# Patient Record
Sex: Female | Born: 1945
Health system: Southern US, Community
[De-identification: ages and names within clinical notes are randomized; demographics above are authoritative.]

## PROBLEM LIST (undated history)

## (undated) DIAGNOSIS — J439 Emphysema, unspecified: Secondary | ICD-10-CM

## (undated) DIAGNOSIS — I10 Essential (primary) hypertension: Secondary | ICD-10-CM

## (undated) DIAGNOSIS — N189 Chronic kidney disease, unspecified: Secondary | ICD-10-CM

## (undated) DIAGNOSIS — R42 Dizziness and giddiness: Secondary | ICD-10-CM

## (undated) DIAGNOSIS — F329 Major depressive disorder, single episode, unspecified: Secondary | ICD-10-CM

## (undated) DIAGNOSIS — M1990 Unspecified osteoarthritis, unspecified site: Secondary | ICD-10-CM

## (undated) DIAGNOSIS — I509 Heart failure, unspecified: Secondary | ICD-10-CM

## (undated) DIAGNOSIS — M109 Gout, unspecified: Secondary | ICD-10-CM

## (undated) DIAGNOSIS — R7989 Other specified abnormal findings of blood chemistry: Secondary | ICD-10-CM

## (undated) DIAGNOSIS — I071 Rheumatic tricuspid insufficiency: Secondary | ICD-10-CM

## (undated) DIAGNOSIS — J189 Pneumonia, unspecified organism: Secondary | ICD-10-CM

## (undated) DIAGNOSIS — Z8709 Personal history of other diseases of the respiratory system: Secondary | ICD-10-CM

## (undated) DIAGNOSIS — R945 Abnormal results of liver function studies: Secondary | ICD-10-CM

## (undated) DIAGNOSIS — E119 Type 2 diabetes mellitus without complications: Secondary | ICD-10-CM

## (undated) DIAGNOSIS — E875 Hyperkalemia: Secondary | ICD-10-CM

## (undated) DIAGNOSIS — Z720 Tobacco use: Secondary | ICD-10-CM

## (undated) DIAGNOSIS — E663 Overweight: Secondary | ICD-10-CM

## (undated) DIAGNOSIS — I4891 Unspecified atrial fibrillation: Secondary | ICD-10-CM

## (undated) DIAGNOSIS — F32A Depression, unspecified: Secondary | ICD-10-CM

## (undated) DIAGNOSIS — Z79899 Other long term (current) drug therapy: Secondary | ICD-10-CM

## (undated) DIAGNOSIS — I340 Nonrheumatic mitral (valve) insufficiency: Secondary | ICD-10-CM

## (undated) DIAGNOSIS — R9430 Abnormal result of cardiovascular function study, unspecified: Secondary | ICD-10-CM

## (undated) DIAGNOSIS — E079 Disorder of thyroid, unspecified: Secondary | ICD-10-CM

## (undated) DIAGNOSIS — E785 Hyperlipidemia, unspecified: Secondary | ICD-10-CM

## (undated) DIAGNOSIS — IMO0002 Reserved for concepts with insufficient information to code with codable children: Secondary | ICD-10-CM

## (undated) DIAGNOSIS — K219 Gastro-esophageal reflux disease without esophagitis: Secondary | ICD-10-CM

## (undated) HISTORY — DX: Nonrheumatic mitral (valve) insufficiency: I34.0

## (undated) HISTORY — DX: Abnormal results of liver function studies: R94.5

## (undated) HISTORY — DX: Major depressive disorder, single episode, unspecified: F32.9

## (undated) HISTORY — DX: Chronic kidney disease, unspecified: N18.9

## (undated) HISTORY — DX: Tobacco use: Z72.0

## (undated) HISTORY — DX: Personal history of other diseases of the respiratory system: Z87.09

## (undated) HISTORY — DX: Disorder of thyroid, unspecified: E07.9

## (undated) HISTORY — PX: CATARACT EXTRACTION W/ INTRAOCULAR LENS  IMPLANT, BILATERAL: SHX1307

## (undated) HISTORY — DX: Reserved for concepts with insufficient information to code with codable children: IMO0002

## (undated) HISTORY — PX: CHOLECYSTECTOMY: SHX55

## (undated) HISTORY — DX: Abnormal result of cardiovascular function study, unspecified: R94.30

## (undated) HISTORY — DX: Other long term (current) drug therapy: Z79.899

## (undated) HISTORY — DX: Hyperlipidemia, unspecified: E78.5

## (undated) HISTORY — DX: Rheumatic tricuspid insufficiency: I07.1

## (undated) HISTORY — PX: JOINT REPLACEMENT: SHX530

## (undated) HISTORY — DX: Unspecified atrial fibrillation: I48.91

## (undated) HISTORY — DX: Type 2 diabetes mellitus without complications: E11.9

## (undated) HISTORY — DX: Heart failure, unspecified: I50.9

## (undated) HISTORY — DX: Other specified abnormal findings of blood chemistry: R79.89

## (undated) HISTORY — DX: Depression, unspecified: F32.A

## (undated) HISTORY — DX: Overweight: E66.3

## (undated) HISTORY — PX: TUBAL LIGATION: SHX77

## (undated) HISTORY — DX: Hyperkalemia: E87.5

## (undated) HISTORY — DX: Emphysema, unspecified: J43.9

---
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
## 1979-02-02 HISTORY — PX: BREAST BIOPSY: SHX20

## 1979-02-02 HISTORY — PX: BREAST LUMPECTOMY: SHX2

## 1985-06-03 HISTORY — PX: VAGINAL HYSTERECTOMY: SUR661

## 1992-06-03 ENCOUNTER — Encounter: Payer: Self-pay | Admitting: Internal Medicine

## 1992-06-03 LAB — CONVERTED CEMR LAB

## 1997-10-25 ENCOUNTER — Ambulatory Visit (HOSPITAL_COMMUNITY): Admission: RE | Admit: 1997-10-25 | Discharge: 1997-10-25 | Payer: Self-pay | Admitting: *Deleted

## 1998-03-09 ENCOUNTER — Observation Stay (HOSPITAL_COMMUNITY): Admission: AD | Admit: 1998-03-09 | Discharge: 1998-03-10 | Payer: Self-pay | Admitting: Neurology

## 1998-09-06 ENCOUNTER — Ambulatory Visit (HOSPITAL_COMMUNITY): Admission: RE | Admit: 1998-09-06 | Discharge: 1998-09-06 | Payer: Self-pay | Admitting: *Deleted

## 1998-09-06 ENCOUNTER — Encounter: Payer: Self-pay | Admitting: *Deleted

## 1999-01-01 ENCOUNTER — Ambulatory Visit (HOSPITAL_COMMUNITY): Admission: RE | Admit: 1999-01-01 | Discharge: 1999-01-01 | Payer: Self-pay | Admitting: *Deleted

## 1999-01-01 ENCOUNTER — Encounter: Payer: Self-pay | Admitting: *Deleted

## 1999-01-02 ENCOUNTER — Ambulatory Visit (HOSPITAL_COMMUNITY): Admission: RE | Admit: 1999-01-02 | Discharge: 1999-01-02 | Payer: Self-pay | Admitting: *Deleted

## 1999-01-02 ENCOUNTER — Encounter: Payer: Self-pay | Admitting: *Deleted

## 1999-09-13 ENCOUNTER — Ambulatory Visit (HOSPITAL_COMMUNITY): Admission: RE | Admit: 1999-09-13 | Discharge: 1999-09-13 | Payer: Self-pay | Admitting: Obstetrics and Gynecology

## 1999-09-13 ENCOUNTER — Encounter: Payer: Self-pay | Admitting: Obstetrics and Gynecology

## 2000-11-25 ENCOUNTER — Ambulatory Visit (HOSPITAL_COMMUNITY): Admission: RE | Admit: 2000-11-25 | Discharge: 2000-11-25 | Payer: Self-pay | Admitting: *Deleted

## 2000-11-25 ENCOUNTER — Encounter: Payer: Self-pay | Admitting: *Deleted

## 2001-01-13 ENCOUNTER — Ambulatory Visit (HOSPITAL_COMMUNITY): Admission: RE | Admit: 2001-01-13 | Discharge: 2001-01-13 | Payer: Self-pay | Admitting: *Deleted

## 2001-01-15 ENCOUNTER — Encounter: Payer: Self-pay | Admitting: *Deleted

## 2001-01-15 ENCOUNTER — Encounter: Admission: RE | Admit: 2001-01-15 | Discharge: 2001-01-15 | Payer: Self-pay | Admitting: *Deleted

## 2001-01-16 ENCOUNTER — Ambulatory Visit (HOSPITAL_COMMUNITY): Admission: RE | Admit: 2001-01-16 | Discharge: 2001-01-16 | Payer: Self-pay | Admitting: *Deleted

## 2001-12-28 ENCOUNTER — Encounter: Payer: Self-pay | Admitting: Obstetrics and Gynecology

## 2001-12-28 ENCOUNTER — Ambulatory Visit (HOSPITAL_COMMUNITY): Admission: RE | Admit: 2001-12-28 | Discharge: 2001-12-28 | Payer: Self-pay | Admitting: *Deleted

## 2001-12-28 ENCOUNTER — Ambulatory Visit (HOSPITAL_COMMUNITY): Admission: RE | Admit: 2001-12-28 | Discharge: 2001-12-28 | Payer: Self-pay | Admitting: Obstetrics and Gynecology

## 2003-11-21 ENCOUNTER — Ambulatory Visit (HOSPITAL_COMMUNITY): Admission: RE | Admit: 2003-11-21 | Discharge: 2003-11-21 | Payer: Self-pay | Admitting: Ophthalmology

## 2004-03-29 ENCOUNTER — Encounter: Admission: RE | Admit: 2004-03-29 | Discharge: 2004-03-29 | Payer: Self-pay | Admitting: Obstetrics and Gynecology

## 2006-02-04 ENCOUNTER — Ambulatory Visit: Payer: Self-pay | Admitting: Internal Medicine

## 2006-03-04 ENCOUNTER — Ambulatory Visit: Payer: Self-pay | Admitting: Internal Medicine

## 2006-11-18 ENCOUNTER — Ambulatory Visit: Payer: Self-pay | Admitting: Internal Medicine

## 2007-10-02 ENCOUNTER — Encounter: Payer: Self-pay | Admitting: Internal Medicine

## 2007-10-02 DIAGNOSIS — Z9189 Other specified personal risk factors, not elsewhere classified: Secondary | ICD-10-CM | POA: Insufficient documentation

## 2007-10-02 DIAGNOSIS — J438 Other emphysema: Secondary | ICD-10-CM | POA: Insufficient documentation

## 2007-10-02 DIAGNOSIS — J40 Bronchitis, not specified as acute or chronic: Secondary | ICD-10-CM | POA: Insufficient documentation

## 2007-10-02 DIAGNOSIS — E119 Type 2 diabetes mellitus without complications: Secondary | ICD-10-CM | POA: Insufficient documentation

## 2007-10-02 DIAGNOSIS — K219 Gastro-esophageal reflux disease without esophagitis: Secondary | ICD-10-CM | POA: Insufficient documentation

## 2007-10-02 DIAGNOSIS — E7800 Pure hypercholesterolemia, unspecified: Secondary | ICD-10-CM | POA: Insufficient documentation

## 2007-10-02 DIAGNOSIS — E785 Hyperlipidemia, unspecified: Secondary | ICD-10-CM | POA: Insufficient documentation

## 2007-10-02 DIAGNOSIS — F329 Major depressive disorder, single episode, unspecified: Secondary | ICD-10-CM | POA: Insufficient documentation

## 2008-04-21 ENCOUNTER — Telehealth: Payer: Self-pay | Admitting: Internal Medicine

## 2009-06-03 DIAGNOSIS — Z79899 Other long term (current) drug therapy: Secondary | ICD-10-CM

## 2009-06-03 HISTORY — DX: Other long term (current) drug therapy: Z79.899

## 2009-06-07 ENCOUNTER — Encounter: Admission: RE | Admit: 2009-06-07 | Discharge: 2009-06-07 | Payer: Self-pay | Admitting: Internal Medicine

## 2009-06-26 ENCOUNTER — Ambulatory Visit: Payer: Self-pay | Admitting: Cardiovascular Disease

## 2009-06-26 ENCOUNTER — Inpatient Hospital Stay (HOSPITAL_COMMUNITY): Admission: EM | Admit: 2009-06-26 | Discharge: 2009-07-01 | Payer: Self-pay | Admitting: Emergency Medicine

## 2009-06-27 ENCOUNTER — Encounter: Payer: Self-pay | Admitting: Cardiology

## 2009-06-27 ENCOUNTER — Encounter (INDEPENDENT_AMBULATORY_CARE_PROVIDER_SITE_OTHER): Payer: Self-pay | Admitting: Internal Medicine

## 2009-06-27 ENCOUNTER — Ambulatory Visit: Payer: Self-pay | Admitting: Vascular Surgery

## 2009-06-30 ENCOUNTER — Encounter: Payer: Self-pay | Admitting: Cardiology

## 2009-07-03 ENCOUNTER — Ambulatory Visit: Payer: Self-pay | Admitting: Cardiology

## 2009-07-03 LAB — CONVERTED CEMR LAB: POC INR: 1.1

## 2009-07-07 ENCOUNTER — Ambulatory Visit: Payer: Self-pay | Admitting: Internal Medicine

## 2009-07-07 LAB — CONVERTED CEMR LAB: POC INR: 1

## 2009-07-12 ENCOUNTER — Ambulatory Visit: Payer: Self-pay | Admitting: Cardiology

## 2009-07-12 LAB — CONVERTED CEMR LAB: POC INR: 1.2

## 2009-07-17 ENCOUNTER — Ambulatory Visit: Payer: Self-pay | Admitting: Cardiology

## 2009-07-17 LAB — CONVERTED CEMR LAB
INR: 2
POC INR: 2

## 2009-07-31 ENCOUNTER — Ambulatory Visit: Payer: Self-pay | Admitting: Cardiology

## 2009-07-31 LAB — CONVERTED CEMR LAB: POC INR: 3

## 2009-08-14 ENCOUNTER — Ambulatory Visit: Payer: Self-pay | Admitting: Internal Medicine

## 2009-08-14 LAB — CONVERTED CEMR LAB: POC INR: 3.7

## 2009-08-15 ENCOUNTER — Ambulatory Visit: Payer: Self-pay | Admitting: Cardiology

## 2009-08-15 DIAGNOSIS — Z6826 Body mass index (BMI) 26.0-26.9, adult: Secondary | ICD-10-CM | POA: Insufficient documentation

## 2009-08-15 DIAGNOSIS — E875 Hyperkalemia: Secondary | ICD-10-CM | POA: Insufficient documentation

## 2009-08-15 DIAGNOSIS — E663 Overweight: Secondary | ICD-10-CM | POA: Insufficient documentation

## 2009-08-16 ENCOUNTER — Telehealth: Payer: Self-pay | Admitting: Cardiology

## 2009-08-21 ENCOUNTER — Ambulatory Visit: Payer: Self-pay | Admitting: Cardiology

## 2009-08-21 LAB — CONVERTED CEMR LAB
BUN: 22 mg/dL (ref 6–23)
Basophils Absolute: 0 10*3/uL (ref 0.0–0.1)
Basophils Relative: 0 % (ref 0–1)
CO2: 27 meq/L (ref 19–32)
Calcium: 9.3 mg/dL (ref 8.4–10.5)
Chloride: 106 meq/L (ref 96–112)
Creatinine, Ser: 1.54 mg/dL — ABNORMAL HIGH (ref 0.40–1.20)
Eosinophils Absolute: 0.1 10*3/uL (ref 0.0–0.7)
Eosinophils Relative: 2 % (ref 0–5)
Glucose, Bld: 125 mg/dL — ABNORMAL HIGH (ref 70–99)
HCT: 37.9 % (ref 36.0–46.0)
Hemoglobin: 12.7 g/dL (ref 12.0–15.0)
INR: 2.54 — ABNORMAL HIGH (ref <1.50)
Lymphocytes Relative: 25 % (ref 12–46)
Lymphs Abs: 2 10*3/uL (ref 0.7–4.0)
MCHC: 33.6 g/dL (ref 30.0–36.0)
MCV: 80.9 fL (ref 78.0–100.0)
Monocytes Absolute: 0.3 10*3/uL (ref 0.1–1.0)
Monocytes Relative: 4 % (ref 3–12)
Neutro Abs: 5.5 10*3/uL (ref 1.7–7.7)
Neutrophils Relative %: 69 % (ref 43–77)
POC INR: 2.9
Platelets: 203 10*3/uL (ref 150–400)
Potassium: 4.1 meq/L (ref 3.5–5.3)
Prothrombin Time: 27.1 s — ABNORMAL HIGH (ref 11.6–15.2)
RBC: 4.69 M/uL (ref 3.87–5.11)
RDW: 15.1 % (ref 11.5–15.5)
Sodium: 138 meq/L (ref 135–145)
WBC: 8 10*3/uL (ref 4.0–10.5)

## 2009-08-22 ENCOUNTER — Telehealth: Payer: Self-pay | Admitting: Cardiology

## 2009-08-22 ENCOUNTER — Ambulatory Visit: Payer: Self-pay | Admitting: Cardiology

## 2009-08-22 ENCOUNTER — Ambulatory Visit (HOSPITAL_COMMUNITY): Admission: RE | Admit: 2009-08-22 | Discharge: 2009-08-22 | Payer: Self-pay | Admitting: Cardiology

## 2009-08-24 ENCOUNTER — Telehealth: Payer: Self-pay | Admitting: Cardiology

## 2009-08-24 ENCOUNTER — Encounter: Payer: Self-pay | Admitting: Cardiology

## 2009-08-28 ENCOUNTER — Ambulatory Visit: Payer: Self-pay | Admitting: Cardiovascular Disease

## 2009-08-28 LAB — CONVERTED CEMR LAB: POC INR: 2.2

## 2009-09-18 ENCOUNTER — Ambulatory Visit: Payer: Self-pay | Admitting: Cardiology

## 2009-09-18 LAB — CONVERTED CEMR LAB: POC INR: 2.1

## 2009-09-19 ENCOUNTER — Telehealth (INDEPENDENT_AMBULATORY_CARE_PROVIDER_SITE_OTHER): Payer: Self-pay | Admitting: *Deleted

## 2009-09-27 ENCOUNTER — Telehealth (INDEPENDENT_AMBULATORY_CARE_PROVIDER_SITE_OTHER): Payer: Self-pay | Admitting: *Deleted

## 2009-10-16 ENCOUNTER — Ambulatory Visit: Payer: Self-pay | Admitting: Cardiovascular Disease

## 2009-10-16 LAB — CONVERTED CEMR LAB: POC INR: 2

## 2009-11-13 ENCOUNTER — Ambulatory Visit: Payer: Self-pay | Admitting: Internal Medicine

## 2009-11-13 LAB — CONVERTED CEMR LAB: POC INR: 2.1

## 2009-11-23 ENCOUNTER — Telehealth: Payer: Self-pay | Admitting: Cardiology

## 2009-12-11 ENCOUNTER — Ambulatory Visit: Payer: Self-pay | Admitting: Internal Medicine

## 2009-12-11 LAB — CONVERTED CEMR LAB: POC INR: 2

## 2010-01-05 ENCOUNTER — Encounter: Payer: Self-pay | Admitting: Cardiovascular Disease

## 2010-02-21 ENCOUNTER — Telehealth (INDEPENDENT_AMBULATORY_CARE_PROVIDER_SITE_OTHER): Payer: Self-pay | Admitting: *Deleted

## 2010-03-05 ENCOUNTER — Telehealth (INDEPENDENT_AMBULATORY_CARE_PROVIDER_SITE_OTHER): Payer: Self-pay | Admitting: *Deleted

## 2010-03-12 ENCOUNTER — Ambulatory Visit: Payer: Self-pay | Admitting: Cardiology

## 2010-03-12 LAB — CONVERTED CEMR LAB: POC INR: 2.2

## 2010-04-04 ENCOUNTER — Telehealth (INDEPENDENT_AMBULATORY_CARE_PROVIDER_SITE_OTHER): Payer: Self-pay | Admitting: *Deleted

## 2010-04-09 ENCOUNTER — Ambulatory Visit: Payer: Self-pay | Admitting: Internal Medicine

## 2010-04-09 LAB — CONVERTED CEMR LAB
INR: 2.5
POC INR: 2.5

## 2010-05-07 ENCOUNTER — Ambulatory Visit: Payer: Self-pay

## 2010-06-24 ENCOUNTER — Encounter: Payer: Self-pay | Admitting: Internal Medicine

## 2010-06-25 ENCOUNTER — Encounter (INDEPENDENT_AMBULATORY_CARE_PROVIDER_SITE_OTHER): Payer: Self-pay | Admitting: Pharmacist

## 2010-07-03 NOTE — Medication Information (Signed)
Summary: rov/eac  Anticoagulant Therapy  Managed by: Edwyna Shell, PharmD Referring MD: Dr Ron Parker PCP: [name], MD Supervising MD: Olevia Perches MD, Heitor Steinhoff Indication 1: Atrial Fibrillation Lab Used: [facility] Site: [place] INR POC 2.1 INR RANGE 2.0-3.0  Dietary changes: no    Health status changes: no    Bleeding/hemorrhagic complications: no    Recent/future hospitalizations: no    Any changes in medication regimen? no    Recent/future dental: no  Any missed doses?: no       Is patient compliant with meds? yes       Current Medications (verified): 1)  Diltiazem Hcl Cr 120 Mg Xr12h-Cap (Diltiazem Hcl) .... Take 1 Tablet By Mouth Once A Day 2)  Enalapril Maleate 5 Mg Tabs (Enalapril Maleate) .... Take 1 Tablet By Mouth Two Times A Day 3)  Furosemide 40 Mg Tabs (Furosemide) .... Take 1 Tablet By Mouth Once A Day 4)  Glucotrol Xl 2.5 Mg Xr24h-Tab (Glipizide) .... Take 1 Tablet By Mouth Once A Day 5)  Omeprazole 20 Mg Cpdr (Omeprazole) .... Take 1 Tablet By Mouth Once A Day 6)  Klor-Con M20 20 Meq Cr-Tabs (Potassium Chloride Crys Cr) .... Take 1 Tablet By Mouth Once A Day 7)  Warfarin Sodium 5 Mg Tabs (Warfarin Sodium) .... Take As Directed By Coumadin Clinic. 8)  Victoza 18 Mg/40ml Soln (Liraglutide) .... Once Daily  Allergies (verified): 1)  ! Codeine  Anticoagulation Management History:      Positive risk factors for bleeding include presence of serious comorbidities.  Negative risk factors for bleeding include an age less than 77 years old.  The bleeding index is 'intermediate risk'.  Positive CHADS2 values include History of CHF and History of Diabetes.  Negative CHADS2 values include Age > 63 years old.  Her last INR was 2.54.  Anticoagulation responsible provider: Olevia Perches MD, Darnell Level.  INR POC: 2.1.  Cuvette Lot#: [id number].  Exp: 10/2010.    Anticoagulation Management Assessment/Plan:      The patient's current anticoagulation dose is Warfarin  sodium 5 mg tabs: Take as directed by coumadin clinic..  The target INR is 2.0-3.0.  The next INR is due 10/16/2009.  Anticoagulation instructions were given to patient.  Results were reviewed/authorized by Edwyna Shell, PharmD.  She was notified by Edwyna Shell.         Prior Anticoagulation Instructions: INR 2.2  Continue taking 1 tablet on Monday and Friday and take 1.5 tablets all other days.  Return to clinic in 3 weeks.   Current Anticoagulation Instructions: INR 2.1  Continue taking 1 tablet on Monday and Friday's and take 1.5 tablets all other days. Recheck in 4 weeks

## 2010-07-03 NOTE — Progress Notes (Signed)
----   Converted from flag ---- ---- 09/27/2009 11:49 AM, Myrtis Ser wrote: Please convert to a phone msg. pt called for pick up of FMLA form on 4/27. left msg.rsc ------------------------------ Phone Note Recieved  [facility]  September 27, 2009 3:38 PM  Appended Document:  Recieved FMLA papers today...will await for patient to pick-up

## 2010-07-03 NOTE — Progress Notes (Signed)
Summary: sch dccv  Phone Note Outgoing Call   Call placed by: Kevan Rosebush, RN,  August 16, 2009 2:40 PM Call placed to: Patient Summary of Call: sch dccv for 3/22 at 1:00, called pt w/time and reviewed instructions w/her agin, she states she is also on a medication called Victoza and wanted to know if she should take it that morining or not, it works to help lower blood sugar so advised pt to hold that along w/glucotrol and furosemide, pt agreeable Kevan Rosebush, RN  August 16, 2009 2:42 PM     New/Updated Medications: VICTOZA 18 MG/3ML SOLN (LIRAGLUTIDE) once daily

## 2010-07-03 NOTE — Progress Notes (Signed)
Summary: pt having palpitations and weak legs  Phone Note Call from Patient Call back at Work Phone [phone]   Caller: Patient Reason for Call: Talk to Nurse, Talk to Doctor Summary of Call: pt is having palpitations and her legs are very weak she contacted her pcp and was told to call Dr. Ron Parker Initial call taken by: Shelda Pal,  November 23, 2009 9:35 AM  Follow-up for Phone Call        episode last pm heart was racing and felt like she might have been in a-fib she laid down and it broke only lasted about 5 min, is better this am, today feels sluggish and weak, advised pt 1 episode was ok to monitor and let us know if it happens again, she states that she had not eaten or done anything unusal yest, will send mess to Dr Ron Parker for his review  Follow-up by: Kevan Rosebush, RN,  November 23, 2009 10:05 AM     Appended Document: pt having palpitations and weak legs Agree

## 2010-07-03 NOTE — Letter (Signed)
Summary: Return To Work  [employer] [place]  [address] [place]    [place], [place] [address]   Phone: [phone]  Fax: [phone]    08/24/2009  TO: WHOM IT MAY CONCERN   RE: RAYNESHA LANGRECK [place] [id number] [place]   The above named individual is under my medical care and may return to work on: Thursday 08/24/09  If you have any further questions or need additional information, please call.     Sincerely,   Kevan Rosebush, RN Cleatis Polka, MD

## 2010-07-03 NOTE — Progress Notes (Signed)
  Walk in Patient Form Recieved :" Pt needs Handicapped paper completed" sent to [place]  April 04, 2010 11:18 AM this form was placed in paperwork instead of given to me..so i am receiving this today and message was taken om 10/31 [facility]  April 04, 2010 11:19 AM

## 2010-07-03 NOTE — Progress Notes (Signed)
Summary: note for work  Phone Note Call from Patient Call back at [employer] [phone]   Caller: Patient Summary of Call: Pt had a cardioverson on Tuesday. She went back to work today and needs a note stating that it is ok for her to return to work today. [id number] [phone] ATTNReinaldo Meeker Pantic Initial call taken by: Lorraine Lax,  August 24, 2009 2:49 PM  Follow-up for Phone Call        note faxed, left mess for pt Kevan Rosebush, RN  August 24, 2009 3:09 PM

## 2010-07-03 NOTE — Progress Notes (Signed)
Summary: Restart of coumadin  Phone Note Outgoing Call   Call placed by: Tula Nakayama, RN, BSN,  February 21, 2010 10:13 AM Details for Reason: Restart coumadin Summary of Call: Pt had been off coumadin for bleed. Telephoned her at work and she states that Dr Benson Norway informed her that she could restart coumadin on Monday, thus f/u appt scheduled for the following Monday, 03/05/10.  Initial call taken by: Tula Nakayama, RN, BSN,  February 21, 2010 10:16 AM

## 2010-07-03 NOTE — Miscellaneous (Signed)
Summary: Orders Update  Clinical Lists Changes  Orders: Added new Test order of T-Basic Metabolic Panel ([id number]) - Signed Added new Test order of T-CBC w/Diff [phone]) - Signed Added new Test order of T-Protime, Auto [id number]) - Signed

## 2010-07-03 NOTE — Medication Information (Signed)
Summary: rov/mb  Anticoagulant Therapy  Managed by: Tula Nakayama, RN, BSN Referring MD: Dr Thomasene Lot MD: Aundra Dubin MD, Velmer Woelfel Indication 1: Atrial Fibrillation Lab Used: [facility] [place] Site: [place] INR POC 3.0 INR RANGE 2.0-3.0  Dietary changes: no    Health status changes: no    Bleeding/hemorrhagic complications: no    Recent/future hospitalizations: no    Any changes in medication regimen? no    Recent/future dental: no  Any missed doses?: no       Is patient compliant with meds? yes       Allergies: 1)  ! Codeine  Anticoagulation Management History:      The patient is taking warfarin and comes in today for a routine follow up visit.  Positive risk factors for bleeding include presence of serious comorbidities.  Negative risk factors for bleeding include an age less than 87 years old.  The bleeding index is 'intermediate risk'.  Positive CHADS2 values include History of Diabetes.  Negative CHADS2 values include Age > 34 years old.  Her last INR was 2.0.  Anticoagulation responsible provider: Aundra Dubin MD, Marcille Barman.  INR POC: 3.0.  Cuvette Lot#: [id number].  Exp: 10/2010.    Anticoagulation Management Assessment/Plan:      The patient's current anticoagulation dose is Warfarin sodium 5 mg tabs: Take as directed by coumadin clinic..  The target INR is 2.0-3.0.  The next INR is due 08/14/2009.  Anticoagulation instructions were given to patient.  Results were reviewed/authorized by Tula Nakayama, RN, BSN.  She was notified by Tula Nakayama, RN, BSN.         Prior Anticoagulation Instructions: The patient is to continue with the same dose of coumadin.  This dosage includes:  take 1.5 tablet every day  Current Anticoagulation Instructions: INR 3.0 Today 1 tablet then change dose to 1.5 tablets everyday except 1 tablet on Mondays. Recheck in 2 weeks.

## 2010-07-03 NOTE — Letter (Signed)
Summary: Cardioversion/TEE Instructions  Press [facility], [place] [address] [place]   [place], [place] [address]   Phone: [phone]  Fax: [phone]    Cardioversion Instructions  You are scheduled for a Cardioversion on Tuesday 08/22/09 with Dr. Ron Parker   Please arrive at the [facility] at TBD a.m. / p.m. on the day of your procedure.  1)   DIET:  A)   Nothing to eat or drink after midnight except your medications with a sip of water.  B)   May have clear liquid breakfast, then nothing to eat or drink after _________ a.m. / p.m.      Clear liquids include:  water, broth, Sprite, Ginger Ale, black coffee, tea (no sugar),      cranberry / grape / apple juice, jello (not red), popsicle from clear juices (not red).  2)   Come to the [place] office on ____________________ for lab work. The lab at [facility] is open from 8:30 a.m. to 1:30 p.m. and 2:30 p.m. to 5:00 p.m. The lab at [facility] is open from 7:30 a.m. to 5:30 p.m. You do not have to be fasting.  3)   MAKE SURE YOU TAKE YOUR COUMADIN.  4)   A)   DO NOT TAKE these medications before your procedure:      Furosemide and Glucotrol AM of 3/22  B)   YOU MAY TAKE ALL of your remaining medications with a small amount of water.    C)   START NEW medications:       ___________________________________________________________________     ___________________________________________________________________  5)  Must have a responsible person to drive you home.  6)   Bring a current list of your medications and current insurance cards.   * Special Note:  Every effort is made to have your procedure done on time. Occasionally there are emergencies that present themselves at the hospital that may cause delays. Please be patient if a delay does occur.  * If you have any questions after you get home, please call the office at [phone].  Appended Document:  Cardioversion/TEE Instructions pt aware to arrive at 11:30 and she can have clear liq until 7am, she is to hold furosemide, glucotrol, and victoza am of procedure

## 2010-07-03 NOTE — Progress Notes (Signed)
  Phone Note Call from Patient   Summary of Call: Pt called req rx for diabetic med, glipizide-metformin, says she "really needs to get started back on this" Pt seen as new pt 02/2006, office visit 03/2006, then seen for acu 11/2006. Other apts were cancelled & she never showed for pending lab apts. Do you want to refill this? OR does she needs labs & office visit first?  Initial call taken by: Charlsie Quest,  April 21, 2008 4:36 PM  Follow-up for Phone Call        No refills. She must come in for an appointment. Follow-up by: Neena Rhymes MD,  April 21, 2008 6:35 PM  Additional Follow-up for Phone Call Additional follow up Details #1::        called pt and made her aware of instruction.Marland KitchenMarland KitchenPennie Rushing stated schedule at a later date Additional Follow-up by: Hector Brunswick,  April 22, 2008 8:26 AM

## 2010-07-03 NOTE — Medication Information (Signed)
Summary: rov/.tm  Anticoagulant Therapy  Managed by: Judson Roch, PharmD Referring MD: Dr Ron Parker PCP: [name], MD Supervising MD: Haroldine Laws MD, Quillian Quince Indication 1: Atrial Fibrillation Lab Used: [facility] Site: [place] INR POC 2.1 INR RANGE 2.0-3.0  Dietary changes: yes       Details: trying to eat less green leafy vegetables  Health status changes: no    Bleeding/hemorrhagic complications: no    Recent/future hospitalizations: no    Any changes in medication regimen? no    Recent/future dental: no  Any missed doses?: no       Is patient compliant with meds? yes       Current Medications (verified): 1)  Diltiazem Hcl Cr 120 Mg Xr12h-Cap (Diltiazem Hcl) .... Take 1 Tablet By Mouth Once A Day 2)  Enalapril Maleate 5 Mg Tabs (Enalapril Maleate) .... Take 1 Tablet By Mouth Two Times A Day 3)  Furosemide 40 Mg Tabs (Furosemide) .... Take 1 Tablet By Mouth Once A Day 4)  Glucotrol Xl 2.5 Mg Xr24h-Tab (Glipizide) .... Take 1 Tablet By Mouth Once A Day 5)  Omeprazole 20 Mg Cpdr (Omeprazole) .... Take 1 Tablet By Mouth Once A Day 6)  Klor-Con M20 20 Meq Cr-Tabs (Potassium Chloride Crys Cr) .... Take 1 Tablet By Mouth Once A Day 7)  Warfarin Sodium 5 Mg Tabs (Warfarin Sodium) .... Take As Directed By Coumadin Clinic. 8)  Victoza 18 Mg/74ml Soln (Liraglutide) .... Once Daily  Allergies (verified): 1)  ! Codeine  Anticoagulation Management History:      The patient is taking warfarin and comes in today for a routine follow up visit.  Positive risk factors for bleeding include an age of 45 years or older and presence of serious comorbidities.  The bleeding index is 'intermediate risk'.  Positive CHADS2 values include History of CHF and History of Diabetes.  Negative CHADS2 values include Age > 29 years old.  Her last INR was 2.54.  Anticoagulation responsible provider: Bensimhon MD, Quillian Quince.  INR POC: 2.1.  Cuvette Lot#: [id number].  Exp: 01/2011.     Anticoagulation Management Assessment/Plan:      The patient's current anticoagulation dose is Warfarin sodium 5 mg tabs: Take as directed by coumadin clinic..  The target INR is 2.0-3.0.  The next INR is due 12/11/2009.  Anticoagulation instructions were given to patient.  Results were reviewed/authorized by Judson Roch, PharmD.  She was notified by Judson Roch.         Prior Anticoagulation Instructions: INR 2.0 Today take 7.5mg s then resume 7.5mg  everyday except 5mg s on Mondays and Fridays. Recheck in 4 weeks.   Current Anticoagulation Instructions: The patient is to continue with the same dose of coumadin.  This dosage includes:   1 1/2 tablets Sunday, Tuesday, Wednesday, Thursday, and Saturday  1 tablet on Monday and Friday

## 2010-07-03 NOTE — Medication Information (Signed)
Summary: Coumadin Clinic  Anticoagulant Therapy  Managed by: Inactive Referring MD: Dr Ron Parker PCP: [name], MD Supervising MD: Harrington Challenger MD, Nevin Bloodgood Indication 1: Atrial Fibrillation Lab Used: [facility] Site: [place] INR RANGE 2.0-3.0          Comments: pt called off Coumadin per Dr Benson Norway s/p surgery/bleed.  Pt will call back once resumes coumadin. Freddrick March RN  January 05, 2010 9:34 AM   Allergies: 1)  ! Codeine  Anticoagulation Management History:      Positive risk factors for bleeding include an age of 66 years or older and presence of serious comorbidities.  The bleeding index is 'intermediate risk'.  Positive CHADS2 values include History of CHF and History of Diabetes.  Negative CHADS2 values include Age > 47 years old.  Her last INR was 2.54.  Anticoagulation responsible provider: Harrington Challenger MD, Nevin Bloodgood.  Exp: 02/2011.    Anticoagulation Management Assessment/Plan:      The patient's current anticoagulation dose is Warfarin sodium 5 mg tabs: Take as directed by coumadin clinic..  The target INR is 2.0-3.0.  The next INR is due 01/08/2010.  Anticoagulation instructions were given to patient.  Results were reviewed/authorized by Inactive.         Prior Anticoagulation Instructions: INR 2.0  Take 1.5 tabs today.  Continue same dose of 1 tab on Monday and Friday and 1.5 tabs all other days.  Re-check INR in 4 weeks.

## 2010-07-03 NOTE — Medication Information (Signed)
Summary: rov/tm  Anticoagulant Therapy  Managed by: Tula Nakayama, RN, BSN Referring MD: Dr Thomasene Lot MD: Harrington Challenger MD, Nevin Bloodgood Indication 1: Atrial Fibrillation Lab Used: [facility] [place] Site: [place] INR POC 3.7 INR RANGE 2.0-3.0  Dietary changes: no    Health status changes: yes       Details: feeling tired, weak and SOB  Bleeding/hemorrhagic complications: no    Recent/future hospitalizations: no    Any changes in medication regimen? no    Recent/future dental: no  Any missed doses?: no       Is patient compliant with meds? yes      Comments: Pt have not seen MD since [facility]. discharge. Thus, appt. s/c for tomorrow. [place]. note said poss. DCCV. Will follow up tomorrow at MD visit.   Allergies: 1)  ! Codeine  Anticoagulation Management History:      The patient is taking warfarin and comes in today for a routine follow up visit.  Positive risk factors for bleeding include presence of serious comorbidities.  Negative risk factors for bleeding include an age less than 86 years old.  The bleeding index is 'intermediate risk'.  Positive CHADS2 values include History of Diabetes.  Negative CHADS2 values include Age > 83 years old.  Her last INR was 2.0.  Anticoagulation responsible provider: Harrington Challenger MD, Nevin Bloodgood.  INR POC: 3.7.  Cuvette Lot#: [id number].  Exp: 10/2010.    Anticoagulation Management Assessment/Plan:      The patient's current anticoagulation dose is Warfarin sodium 5 mg tabs: Take as directed by coumadin clinic..  The target INR is 2.0-3.0.  The next INR is due 08/14/2009.  Anticoagulation instructions were given to patient.  Results were reviewed/authorized by Tula Nakayama, RN, BSN.  She was notified by Tula Nakayama, RN, BSN.         Prior Anticoagulation Instructions: INR 3.0 Today 1 tablet then change dose to 1.5 tablets everyday except 1 tablet on Mondays. Recheck in 2 weeks.   Current Anticoagulation Instructions: INR 3.7 Skip today  then change dose to 1.5 pill everyday except 1 pill on Mondays and Fridays.

## 2010-07-03 NOTE — Progress Notes (Signed)
Summary: refill  Phone Note Refill Request   Refills Requested: Medication #1:  WARFARIN SODIUM 7.5 MG 1 1/2TABS on Sunday,Tuesday, Wednesday, Thursday and Saturday 5mg 1tab on Monday and Friday.   Supply Requested: [facility] days  [facility] [address]   Method Requested: Fax to Local Pharmacy Initial call taken by: Jennifer Sebastian,  August 22, 2009 9:23 AM    Prescriptions: WARFARIN SODIUM 5 MG TABS (WARFARIN SODIUM) Take as directed by coumadin clinic.  #50 x 2   Entered by:   Erika Johnson RN   Authorized by:   Erielle Gawronski [place], MD, FACC   Signed by:   Erika Johnson RN on 08/22/2009   Method used:   [facility]  [address] [id number]       [address]       [place], [place]  [address]       Ph: [phone] or [phone]       Fax: [phone]   RxID:   [id number]

## 2010-07-03 NOTE — Progress Notes (Signed)
  Pt faxed FMLA papers over, I have forwarded to [facility]  September 19, 2009 10:29 AM    Appended Document:  Dr.Katz signed FMLA papers sent back to [employer] today

## 2010-07-03 NOTE — Medication Information (Signed)
Summary: coumadin f/u  Anticoagulant Therapy  Managed by: Freddrick March, RN, BSN Referring MD: Dr Ron Parker PCP: [name], MD Supervising MD: Aundra Dubin MD, Neetu Carrozza Indication 1: Atrial Fibrillation Lab Used: [facility] [place] Site: [place] INR POC 2.9 INR RANGE 2.0-3.0  Dietary changes: no    Health status changes: no    Bleeding/hemorrhagic complications: yes       Details: Gums bleeding more easily when brushes teeth.  And BRB visable on tissue after wipes after BM.  Recent/future hospitalizations: no    Any changes in medication regimen? yes       Details: Starting on Insulin tomorrow.    Recent/future dental: no  Any missed doses?: no       Is patient compliant with meds? yes      Comments: Pt scheduled for DCCV tomorrow.    Allergies (verified): 1)  ! Codeine  Anticoagulation Management History:      The patient is taking warfarin and comes in today for a routine follow up visit.  Positive risk factors for bleeding include presence of serious comorbidities.  Negative risk factors for bleeding include an age less than 10 years old.  The bleeding index is 'intermediate risk'.  Positive CHADS2 values include History of CHF and History of Diabetes.  Negative CHADS2 values include Age > 83 years old.  Her last INR was 2.0.  Anticoagulation responsible provider: Aundra Dubin MD, Kaela Beitz.  INR POC: 2.9.  Exp: 10/2010.    Anticoagulation Management Assessment/Plan:      The patient's current anticoagulation dose is Warfarin sodium 5 mg tabs: Take as directed by coumadin clinic..  The target INR is 2.0-3.0.  The next INR is due 08/28/2009.  Anticoagulation instructions were given to patient.  Results were reviewed/authorized by Freddrick March, RN, BSN.  She was notified by Freddrick March RN.         Prior Anticoagulation Instructions: INR 3.7 Skip today then change dose to 1.5 pill everyday except 1 pill on Mondays and Fridays.   Current Anticoagulation  Instructions: INR 2.9  Continue on same dosage 1.5 tablets daily except 1 tablet on Mondays and Fridays.  Recheck in 1 week. Cardioversion tomorrow.

## 2010-07-03 NOTE — Medication Information (Signed)
Summary: rov/ewj  Anticoagulant Therapy  Managed by: Margaretha Sheffield, PharmD Referring MD: Dr Ron Parker PCP: [name], MD Supervising MD: Angelena Form MD, Harrell Gave Indication 1: Atrial Fibrillation Lab Used: [facility] Site: [place] INR POC 2.2 INR RANGE 2.0-3.0  Dietary changes: no    Health status changes: no    Bleeding/hemorrhagic complications: no    Recent/future hospitalizations: yes       Details: Pt underwent cardioversion this past tuesday 08/21/09  Any changes in medication regimen? yes       Details: Pt started on Levemir 10 units each evening  Recent/future dental: no  Any missed doses?: no       Is patient compliant with meds? yes       Allergies: 1)  ! Codeine  Anticoagulation Management History:      The patient is taking warfarin and comes in today for a routine follow up visit.  Positive risk factors for bleeding include presence of serious comorbidities.  Negative risk factors for bleeding include an age less than 60 years old.  The bleeding index is 'intermediate risk'.  Positive CHADS2 values include History of CHF and History of Diabetes.  Negative CHADS2 values include Age > 53 years old.  Her last INR was 2.54.  Anticoagulation responsible provider: Angelena Form MD, Harrell Gave.  INR POC: 2.2.  Cuvette Lot#: [id number].  Exp: 10/2010.    Anticoagulation Management Assessment/Plan:      The patient's current anticoagulation dose is Warfarin sodium 5 mg tabs: Take as directed by coumadin clinic..  The target INR is 2.0-3.0.  The next INR is due 09/18/2009.  Anticoagulation instructions were given to patient.  Results were reviewed/authorized by Margaretha Sheffield, PharmD.  She was notified by Margaretha Sheffield.         Prior Anticoagulation Instructions: INR 2.9  Continue on same dosage 1.5 tablets daily except 1 tablet on Mondays and Fridays.  Recheck in 1 week. Cardioversion tomorrow.  Current Anticoagulation Instructions: INR  2.2  Continue taking 1 tablet on Monday and Friday and take 1.5 tablets all other days.  Return to clinic in 3 weeks.

## 2010-07-03 NOTE — Medication Information (Signed)
Summary: new to coumadin/afib  Anticoagulant Therapy  Managed by: Freddrick March, RN, BSN Referring MD: Dr Thomasene Lot MD: Aundra Dubin MD, Kiandre Spagnolo Indication 1: Atrial Fibrillation Lab Used: [facility] [place] Site: [place] INR POC 1.1 INR RANGE 2.0-3.0  Dietary changes: yes       Details: Reviewed importance of consistancy of vit K in the diet.   Health status changes: no    Bleeding/hemorrhagic complications: yes       Details: Pt educated on importance of monitoring for abnormal bleeding, precautions given.    Recent/future hospitalizations: yes       Details: Pt in [facility] 1/24-1/29.  Any changes in medication regimen? yes       Details: Medication list updated, pt aware to call for medication changes.  Recent/future dental: no  Any missed doses?: yes     Details: Missed discharge day dose of warfarin 2.5mg .  Is patient compliant with meds? yes      Comments: Started on 2.5mg  warfarin on 06/30/09.  Discharged on 1/29 didn't start taking home dose of 2.5mg  daily until 07/02/09. New pt education done.    Current Medications (verified): 1)  Diltiazem Hcl Cr 120 Mg Xr12h-Cap (Diltiazem Hcl) .... Take 1 Tablet By Mouth Once A Day 2)  Enalapril Maleate 5 Mg Tabs (Enalapril Maleate) .... Take 1 Tablet By Mouth Two Times A Day 3)  Furosemide 40 Mg Tabs (Furosemide) .... Take 1 Tablet By Mouth Once A Day 4)  Glucotrol Xl 2.5 Mg Xr24h-Tab (Glipizide) .... Take 1 Tablet By Mouth Once A Day 5)  Omeprazole 20 Mg Cpdr (Omeprazole) .... Take 1 Tablet By Mouth Once A Day 6)  Klor-Con M20 20 Meq Cr-Tabs (Potassium Chloride Crys Cr) .... Take 1 Tablet By Mouth Once A Day 7)  Warfarin Sodium 2.5 Mg Tabs (Warfarin Sodium) .... Take As Directed By Coumadin Clinic.  Allergies (verified): 1)  ! Codeine  Anticoagulation Management History:      The patient is taking warfarin and comes in today for a routine follow up visit.  Positive risk factors for bleeding include presence of  serious comorbidities.  Negative risk factors for bleeding include an age less than 70 years old.  The bleeding index is 'intermediate risk'.  Positive CHADS2 values include History of Diabetes.  Negative CHADS2 values include Age > 23 years old.  Anticoagulation responsible provider: Aundra Dubin MD, Caide Campi.  INR POC: 1.1.  Cuvette Lot#: [id number].  Exp: 09/2010.    Anticoagulation Management Assessment/Plan:      The patient's current anticoagulation dose is Warfarin sodium 2.5 mg tabs: Take as directed by coumadin clinic..  The target INR is 2.0-3.0.  The next INR is due 07/07/2009.  Results were reviewed/authorized by Freddrick March, RN, BSN.  She was notified by Freddrick March RN.         Current Anticoagulation Instructions: INR 1.1  Continue taking 2.5mg  daily.  Recheck on Friday.

## 2010-07-03 NOTE — Medication Information (Signed)
Summary: rov/td  Anticoagulant Therapy  Managed by: Porfirio Oar, PharmD Referring MD: Dr Ron Parker PCP: [name], MD Supervising MD: Harrington Challenger MD, Nevin Bloodgood Indication 1: Atrial Fibrillation Lab Used: [facility] [place] Site: [place] INR POC 2.0 INR RANGE 2.0-3.0  Dietary changes: no    Health status changes: no    Bleeding/hemorrhagic complications: no    Recent/future hospitalizations: no    Any changes in medication regimen? no    Recent/future dental: no  Any missed doses?: no       Is patient compliant with meds? yes       Allergies: 1)  ! Codeine  Anticoagulation Management History:      The patient is taking warfarin and comes in today for a routine follow up visit.  Positive risk factors for bleeding include an age of 66 years or older and presence of serious comorbidities.  The bleeding index is 'intermediate risk'.  Positive CHADS2 values include History of CHF and History of Diabetes.  Negative CHADS2 values include Age > 65 years old.  Her last INR was 2.54.  Anticoagulation responsible provider: Harrington Challenger MD, Nevin Bloodgood.  INR POC: 2.0.  Cuvette Lot#: [id number].  Exp: 02/2011.    Anticoagulation Management Assessment/Plan:      The patient's current anticoagulation dose is Warfarin sodium 5 mg tabs: Take as directed by coumadin clinic..  The target INR is 2.0-3.0.  The next INR is due 01/08/2010.  Anticoagulation instructions were given to patient.  Results were reviewed/authorized by Porfirio Oar, PharmD.  She was notified by Lind Covert.         Prior Anticoagulation Instructions: The patient is to continue with the same dose of coumadin.  This dosage includes:   1 1/2 tablets Sunday, Tuesday, Wednesday, Thursday, and Saturday  1 tablet on Monday and Friday  Current Anticoagulation Instructions: INR 2.0  Take 1.5 tabs today.  Continue same dose of 1 tab on Monday and Friday and 1.5 tabs all other days.  Re-check INR in 4 weeks.

## 2010-07-03 NOTE — Assessment & Plan Note (Signed)
Summary: per coumadin/pt was to f/u in jan when she was d/c/saf   Visit Type:  Follow-up Primary Provider:  [name], MD  CC:  atrial fibrillation.  History of Present Illness: The patient is seen for followup of atrial fibrillation.  She had been hospitalized in January, 2011.  At that time her ejection fraction was reduced.  She was diuresed.  Coumadin was started.  She has received Coumadin since then.  She has remained in atrial fibrillation.  Our hope is to proceed with cardioversion.  Current Medications (verified): 1)  Diltiazem Hcl Cr 120 Mg Xr12h-Cap (Diltiazem Hcl) .... Take 1 Tablet By Mouth Once A Day 2)  Enalapril Maleate 5 Mg Tabs (Enalapril Maleate) .... Take 1 Tablet By Mouth Two Times A Day 3)  Furosemide 40 Mg Tabs (Furosemide) .... Take 1 Tablet By Mouth Once A Day 4)  Glucotrol Xl 2.5 Mg Xr24h-Tab (Glipizide) .... Take 1 Tablet By Mouth Once A Day 5)  Omeprazole 20 Mg Cpdr (Omeprazole) .... Take 1 Tablet By Mouth Once A Day 6)  Klor-Con M20 20 Meq Cr-Tabs (Potassium Chloride Crys Cr) .... Take 1 Tablet By Mouth Once A Day 7)  Warfarin Sodium 5 Mg Tabs (Warfarin Sodium) .... Take As Directed By Coumadin Clinic.  Allergies (verified): 1)  ! Codeine  Past History:  Past Medical History: Last updated: 08/15/2009 HYPERKALEMIA (ICD-276.7) OVERWEIGHT/OBESITY (ICD-278.02) CONGESTIVE HEART FAILURE, UNSPECIFIED (ICD-428.0) BREAST BIOPSY, HX OF (ICD-V15.9) HYPERLIPIDEMIA (ICD-272.4) GERD (ICD-530.81) DIABETES MELLITUS, TYPE II (ICD-250.00) DEPRESSION (ICD-311) Hx of BRONCHITIS (ICD-490) Hx of EMPHYSEMA (ICD-492.8) ATRIAL FIBRILLATION (ICD-427.31) Chronic kidney disease thyromegaly.Marland Kitchen diffuse... nodule left lower lobe... January, 2011 EF 20-25%... echo... January, 2011...needing to use study during left ventricular size  Mitral regurgitation.... mild.... echo.... January 2 011 Tricuspid regurgitation.... moderate... echo.... January, 2011 Coumadin therapy....  started.. January, 2011 Medical noncompliance LFTs... elevated.... January, 2011  Physician Roster 1.  Christena Deem. Melvyn Novas, MD, FCCP, pulmonary 2. Dr. Elliot Gault, endocrine/diabetes  Review of Systems       Patient denies fever, chills, headache, sweats, rash, change in vision, change in hearing, chest pain, cough, nausea or vomiting, urinary symptoms.  All of the systems are reviewed and are negative.  Vital Signs:  Patient profile:   65 year old female Height:      69 inches Weight:      265 pounds BMI:     39.28 Pulse rate:   98 / minute BP sitting:   122 / 68  (left arm) Cuff size:   regular  Vitals Entered By: Mignon Pine, RMA (August 15, 2009 4:26 PM)  Physical Exam  General:  patient is overweight but stable. Head:  head is atraumatic. Eyes:  no xanthelasma. Neck:  no jugular venous distention. Chest Wall:  no chest wall tenderness. Lungs:  lungs are clear.  Respiratory effort is nonlabored. Heart:  cardiac exam reveals S1-S2.  No clicks or significant murmurs. Abdomen:  abdomen is soft. Msk:  no musculoskeletal deformities. Extremities:  trace peripheral edema. Skin:  no skin rashes. Psych:  patient is oriented to person time and place.  Affect is normal.   Impression & Recommendations:  Problem # 1:  * ELEVATED LFTS We will need to followup the patient's LFTs at a later date.  Problem # 2:  COUMADIN THERAPY (ICD-V58.61) Patient is receiving Coumadin.  We will rereview the Coumadin records.  If her INR has been greater than 2 for greater than 3 or 4 weeks we will proceed with scheduling cardioversion.  Problem # 3:  * EF  20-25% The patient is on an ACE inhibitor.  With her left ventricular dysfunction.  He will be optimal for her to be on a beta blocker.  She is on diltiazem currently.  This is for atrial fibrillation control.  I will try to begin her on carvedilol if possible.  Problem # 4:  ATRIAL FIBRILLATION (ICD-427.31)  The patient has ongoing atrial  fibrillation.  EKG is done today and reviewed by me.  Her rate is 98.  She can use better rate control.  I will be checking her Coumadin status and scheduling cardioversion if she has been therapeutic for a long enough period of time. As part of today's evaluation I have reviewed her hospitalization record from January 20 oh 11.  I have reviewed cardiology consultation note from that hospitalization also. I have carefully reviewed the Coumadin data.  The patient has been fully anticoagulated for more than 3 weeks.  We will arrange for cardioversion.  Orders: EKG w/ Interpretation (93000)  Patient Instructions: 1)  Labwork on 08/21/09--bmet, cbc, pt 2)  Coumadin Clinic visit 08/21/09 3)  Your physician has recommended that you have a cardioversion (DCCV).  Electrical cardioversion uses a jolt of electricity to your heart either through paddles or wired patches attached to your chest. This is a controlled, usually prescheduled, procedure. Defibrillation is done under light anesthesia in the hospital, and you usually go home the day of the procedure. This is done to get your heart back into a normal rhythm. You are not awake for the procedure. Please see the instruction sheet given to you today. 4)  Follow up in 4 weeks

## 2010-07-03 NOTE — Medication Information (Signed)
Summary: [name]  Anticoagulant Therapy  Managed by: Tula Nakayama, RN, BSN Referring MD: Dr Ron Parker PCP: [name], MD Supervising MD: Angelena Form MD, Harrell Gave Indication 1: Atrial Fibrillation Lab Used: [facility] Site: [place] INR POC 2.0 INR RANGE 2.0-3.0  Dietary changes: no    Health status changes: no    Bleeding/hemorrhagic complications: no    Recent/future hospitalizations: no       Details: Starting Crestor tonight  Any changes in medication regimen? yes       Details: Starting Crestor tonight  Recent/future dental: no  Any missed doses?: no       Is patient compliant with meds? yes       Allergies: 1)  ! Codeine  Anticoagulation Management History:      The patient is taking warfarin and comes in today for a routine follow up visit.  Positive risk factors for bleeding include an age of 54 years or older and presence of serious comorbidities.  The bleeding index is 'intermediate risk'.  Positive CHADS2 values include History of CHF and History of Diabetes.  Negative CHADS2 values include Age > 71 years old.  Her last INR was 2.54.  Anticoagulation responsible provider: Angelena Form MD, Harrell Gave.  INR POC: 2.0.  Cuvette Lot#: [id number].  Exp: 01/2011.    Anticoagulation Management Assessment/Plan:      The patient's current anticoagulation dose is Warfarin sodium 5 mg tabs: Take as directed by coumadin clinic..  The target INR is 2.0-3.0.  The next INR is due 11/13/2009.  Anticoagulation instructions were given to patient.  Results were reviewed/authorized by Tula Nakayama, RN, BSN.  She was notified by Tula Nakayama, RN, BSN.         Prior Anticoagulation Instructions: INR 2.1  Continue taking 1 tablet on Monday and Friday's and take 1.5 tablets all other days. Recheck in 4 weeks  Current Anticoagulation Instructions: INR 2.0 Today take 7.5mg s then resume 7.5mg  everyday except 5mg s on Mondays and Fridays. Recheck in 4 weeks.

## 2010-07-03 NOTE — Medication Information (Signed)
Summary: rov/tm  Anticoagulant Therapy  Managed by: Roxanne Gates, PharmD Referring MD: Dr Thomasene Lot MD: Aundra Dubin MD, Kirk Ruths Indication 1: Atrial Fibrillation Lab Used: [facility] [place] Site: [place] INR POC 2.0 INR RANGE 2.0-3.0  Dietary changes: yes    Health status changes: no    Bleeding/hemorrhagic complications: no    Recent/future hospitalizations: no    Any changes in medication regimen? no    Recent/future dental: no  Any missed doses?: no       Is patient compliant with meds? yes       Current Medications (verified): 1)  Diltiazem Hcl Cr 120 Mg Xr12h-Cap (Diltiazem Hcl) .... Take 1 Tablet By Mouth Once A Day 2)  Enalapril Maleate 5 Mg Tabs (Enalapril Maleate) .... Take 1 Tablet By Mouth Two Times A Day 3)  Furosemide 40 Mg Tabs (Furosemide) .... Take 1 Tablet By Mouth Once A Day 4)  Glucotrol Xl 2.5 Mg Xr24h-Tab (Glipizide) .... Take 1 Tablet By Mouth Once A Day 5)  Omeprazole 20 Mg Cpdr (Omeprazole) .... Take 1 Tablet By Mouth Once A Day 6)  Klor-Con M20 20 Meq Cr-Tabs (Potassium Chloride Crys Cr) .... Take 1 Tablet By Mouth Once A Day 7)  Warfarin Sodium 5 Mg Tabs (Warfarin Sodium) .... Take As Directed By Coumadin Clinic.  Allergies (verified): 1)  ! Codeine  Anticoagulation Management History:      The patient is taking warfarin and comes in today for a routine follow up visit.  Positive risk factors for bleeding include presence of serious comorbidities.  Negative risk factors for bleeding include an age less than 29 years old.  The bleeding index is 'intermediate risk'.  Positive CHADS2 values include History of Diabetes.  Negative CHADS2 values include Age > 12 years old.  Today's INR is 2.0.  Anticoagulation responsible provider: Aundra Dubin MD, [place].  INR POC: 2.0.  Cuvette Lot#: [id number].  Exp: 09/2010.    Anticoagulation Management Assessment/Plan:      The patient's current anticoagulation dose is Warfarin sodium 5 mg  tabs: Take as directed by coumadin clinic..  The target INR is 2.0-3.0.  The next INR is due 07/31/2009.  Anticoagulation instructions were given to patient.  Results were reviewed/authorized by Roxanne Gates, PharmD.  She was notified by Roxanne Gates.         Prior Anticoagulation Instructions: INR 1.2 Change dose to 1.5 tablets everyday. Recheck on 07/17/09.   Current Anticoagulation Instructions: The patient is to continue with the same dose of coumadin.  This dosage includes:  take 1.5 tablet every day

## 2010-07-03 NOTE — Progress Notes (Signed)
  Phone Note Call from Patient   Caller: Patient Call For: CVRR Details for Reason: Restarting Coumadin Summary of Call: Pt called to state she just restarted coumadin last night thus, wanted to R/ S appt til next Monday.  Initial call taken by: Porfirio Oar PharmD,  March 05, 2010 9:37 AM

## 2010-07-03 NOTE — Medication Information (Signed)
Summary: rov/ewj  Anticoagulant Therapy  Managed by: Tula Nakayama, RN, BSN Referring MD: Dr Thomasene Lot MD: Stanford Breed MD, Aaron Edelman Indication 1: Atrial Fibrillation Lab Used: [facility]  Site: [place] INR POC 1.2 INR RANGE 2.0-3.0  Dietary changes: no    Health status changes: no    Bleeding/hemorrhagic complications: no    Recent/future hospitalizations: no    Any changes in medication regimen? no    Recent/future dental: no  Any missed doses?: no       Is patient compliant with meds? yes       Allergies: 1)  ! Codeine  Anticoagulation Management History:      The patient is taking warfarin and comes in today for a routine follow up visit.  Positive risk factors for bleeding include presence of serious comorbidities.  Negative risk factors for bleeding include an age less than 64 years old.  The bleeding index is 'intermediate risk'.  Positive CHADS2 values include History of Diabetes.  Negative CHADS2 values include Age > 37 years old.  Anticoagulation responsible provider: Stanford Breed MD, Aaron Edelman.  INR POC: 1.2.  Cuvette Lot#: [id number].  Exp: 09/2010.    Anticoagulation Management Assessment/Plan:      The patient's current anticoagulation dose is Warfarin sodium 5 mg tabs: Take as directed by coumadin clinic..  The target INR is 2.0-3.0.  The next INR is due 07/17/2009.  Anticoagulation instructions were given to patient.  Results were reviewed/authorized by Tula Nakayama, RN, BSN.  She was notified by Tula Nakayama, RN, BSN.         Prior Anticoagulation Instructions: INR 1.0  Start taking 5mg  daily.  Recheck on Wednesday.    Current Anticoagulation Instructions: INR 1.2 Change dose to 1.5 tablets everyday. Recheck on 07/17/09.

## 2010-07-03 NOTE — Medication Information (Signed)
Summary: rov/ewj  Anticoagulant Therapy  Managed by: Freddrick March, RN, BSN Referring MD: Dr Thomasene Lot MD: Rayann Heman MD, Jeneen Rinks Indication 1: Atrial Fibrillation Lab Used: [facility]  Site: [place] INR POC 1.0 INR RANGE 2.0-3.0  Dietary changes: no    Health status changes: no    Bleeding/hemorrhagic complications: no    Recent/future hospitalizations: no    Any changes in medication regimen? no    Recent/future dental: no  Any missed doses?: no       Is patient compliant with meds? yes       Allergies (verified): 1)  ! Codeine  Anticoagulation Management History:      The patient is taking warfarin and comes in today for a routine follow up visit.  Positive risk factors for bleeding include presence of serious comorbidities.  Negative risk factors for bleeding include an age less than 69 years old.  The bleeding index is 'intermediate risk'.  Positive CHADS2 values include History of Diabetes.  Negative CHADS2 values include Age > 76 years old.  Anticoagulation responsible provider: Allred MD, Jeneen Rinks.  INR POC: 1.0.  Cuvette Lot#: [id number].  Exp: 09/2010.    Anticoagulation Management Assessment/Plan:      The patient's current anticoagulation dose is Warfarin sodium 5 mg tabs: Take as directed by coumadin clinic..  The target INR is 2.0-3.0.  The next INR is due 07/12/2009.  Results were reviewed/authorized by Freddrick March, RN, BSN.  She was notified by Freddrick March RN.         Prior Anticoagulation Instructions: INR 1.1  Continue taking 2.5mg  daily.  Recheck on Friday.    Current Anticoagulation Instructions: INR 1.0  Start taking 5mg  daily.  Recheck on Wednesday.   Prescriptions: WARFARIN SODIUM 5 MG TABS (WARFARIN SODIUM) Take as directed by coumadin clinic.  #45 x 1   Entered by:   Freddrick March RN   Authorized by:   Lorenza Evangelist, MD, [facility]   Signed by:   Freddrick March RN on 07/07/2009   Method used:   Electronically to          [facility]. [id number]* (retail)       [address]       [place], [place]  [address]       Ph: [phone] or [phone]       Fax: [phone]   RxID:   [id number]

## 2010-07-03 NOTE — Miscellaneous (Signed)
  Clinical Lists Changes  Problems: Added new problem of * EF  20-25% Added new problem of COUMADIN THERAPY (ICD-V58.61) Added new problem of * ELEVATED LFTS Observations: Added new observation of PRIMARY MD: [name], MD (08/15/2009 12:42) Added new observation of PAST MED HX: HYPERKALEMIA (ICD-276.7) OVERWEIGHT/OBESITY (ICD-278.02) CONGESTIVE HEART FAILURE, UNSPECIFIED (ICD-428.0) BREAST BIOPSY, HX OF (ICD-V15.9) HYPERLIPIDEMIA (ICD-272.4) GERD (ICD-530.81) DIABETES MELLITUS, TYPE II (ICD-250.00) DEPRESSION (ICD-311) Hx of BRONCHITIS (ICD-490) Hx of EMPHYSEMA (ICD-492.8) ATRIAL FIBRILLATION (ICD-427.31) Chronic kidney disease thyromegaly.Marland Kitchen diffuse... nodule left lower lobe... January, 2011 EF 20-25%... echo... January, 2011...needing to use study during left ventricular size  Mitral regurgitation.... mild.... echo.... January 2 011 Tricuspid regurgitation.... moderate... echo.... January, 2011 Coumadin therapy.... started.. January, 2011 Medical noncompliance LFTs... elevated.... January, 2011  Physician Roster 1.  Christena Deem. Melvyn Novas, MD, FCCP, pulmonary 2. Dr. Elliot Gault, endocrine/diabetes (08/15/2009 12:42)       Past History:  Past Medical History: HYPERKALEMIA (ICD-276.7) OVERWEIGHT/OBESITY (ICD-278.02) CONGESTIVE HEART FAILURE, UNSPECIFIED (ICD-428.0) BREAST BIOPSY, HX OF (ICD-V15.9) HYPERLIPIDEMIA (ICD-272.4) GERD (ICD-530.81) DIABETES MELLITUS, TYPE II (ICD-250.00) DEPRESSION (ICD-311) Hx of BRONCHITIS (ICD-490) Hx of EMPHYSEMA (ICD-492.8) ATRIAL FIBRILLATION (ICD-427.31) Chronic kidney disease thyromegaly.Marland Kitchen diffuse... nodule left lower lobe... January, 2011 EF 20-25%... echo... January, 2011...needing to use study during left ventricular size  Mitral regurgitation.... mild.... echo.... January 2 011 Tricuspid regurgitation.... moderate... echo.... January, 2011 Coumadin therapy.... started.. January, 2011 Medical noncompliance LFTs...  elevated.... January, 2011  Physician Roster 1.  Christena Deem. Melvyn Novas, MD, FCCP, pulmonary 2. Dr. Elliot Gault, endocrine/diabetes

## 2010-07-03 NOTE — Medication Information (Signed)
Summary: ROV/TP  Anticoagulant Therapy  Managed by: Javier Glazier, PharmD Referring MD: Dr Ron Parker PCP: [name], MD Supervising MD: Rayann Heman MD, Jeneen Rinks Indication 1: Atrial Fibrillation Lab Used: [facility] [place] Site: [place] INR POC 2.5 INR RANGE 2.0-3.0  Dietary changes: no    Health status changes: no    Bleeding/hemorrhagic complications: no    Recent/future hospitalizations: no    Any changes in medication regimen? no    Recent/future dental: no  Any missed doses?: no       Is patient compliant with meds? yes       Allergies: 1)  ! Codeine  Anticoagulation Management History:      The patient is taking warfarin and comes in today for a routine follow up visit.  Positive risk factors for bleeding include an age of 6 years or older and presence of serious comorbidities.  The bleeding index is 'intermediate risk'.  Positive CHADS2 values include History of CHF and History of Diabetes.  Negative CHADS2 values include Age > 76 years old.  Her last INR was 2.54 and today's INR is 2.5.  Anticoagulation responsible provider: Yazleemar Strassner MD, Jeneen Rinks.  INR POC: 2.5.  Cuvette Lot#: [id number].  Exp: 02/2011.    Anticoagulation Management Assessment/Plan:      The patient's current anticoagulation dose is Warfarin sodium 5 mg tabs: Take as directed by coumadin clinic..  The target INR is 2.0-3.0.  The next INR is due 05/07/2010.  Anticoagulation instructions were given to patient.  Results were reviewed/authorized by Javier Glazier, PharmD.         Prior Anticoagulation Instructions: INR:  2.2  Your INR is within range today.  Please continue to take your Coumadin at the same dose; 5 mg on Monday and Friday, and 7.5 mg other days of the week.  Please come back in a month for another INR check.    Current Anticoagulation Instructions: INR 2.5  Continue current regimen of 7.5mg  every day except take 5mg  on Mondays and Fridays.  Return in 4 weeks.

## 2010-07-03 NOTE — Consult Note (Signed)
Summary: [facility]   Imported By: Sallee Provencal 07/05/2009 10:58:56  _____________________________________________________________________  External Attachment:    Type:   Image     Comment:   External Document

## 2010-07-03 NOTE — Medication Information (Signed)
Summary: rov/pt  Anticoagulant Therapy  Managed by: Vanessa [place], PharmD Referring MD: Dr Ron Parker PCP: [name], MD Supervising MD: Percival Spanish MD, Jeneen Rinks Indication 1: Atrial Fibrillation Lab Used: [facility] Site: [place] INR POC 2.2 INR RANGE 2.0-3.0  Dietary changes: no    Health status changes: no    Bleeding/hemorrhagic complications: no    Recent/future hospitalizations: no    Any changes in medication regimen? no    Recent/future dental: no  Any missed doses?: no       Is patient compliant with meds? yes       Allergies: 1)  ! Codeine  Anticoagulation Management History:      Positive risk factors for bleeding include an age of 33 years or older and presence of serious comorbidities.  The bleeding index is 'intermediate risk'.  Positive CHADS2 values include History of CHF and History of Diabetes.  Negative CHADS2 values include Age > 11 years old.  Her last INR was 2.54.  Anticoagulation responsible provider: Percival Spanish MD, Jeneen Rinks.  INR POC: 2.2.  Exp: 02/2011.    Anticoagulation Management Assessment/Plan:      The patient's current anticoagulation dose is Warfarin sodium 5 mg tabs: Take as directed by coumadin clinic..  The target INR is 2.0-3.0.  The next INR is due 04/09/2010.  Anticoagulation instructions were given to patient.  Results were reviewed/authorized by Vanessa , PharmD.         Prior Anticoagulation Instructions: INR 2.0  Take 1.5 tabs today.  Continue same dose of 1 tab on Monday and Friday and 1.5 tabs all other days.  Re-check INR in 4 weeks.  Current Anticoagulation Instructions: INR:  2.2  Your INR is within range today.  Please continue to take your Coumadin at the same dose; 5 mg on Monday and Friday, and 7.5 mg other days of the week.  Please come back in a month for another INR check.

## 2010-07-05 NOTE — Letter (Signed)
Summary: [facility] - Delinquent Coumadin 1  Coumadin  [address] [place]   [place], [place] [address]   Phone: [phone]  Fax: [phone]     June 25, 2010 MRN: [id number]   [facility] [address] [place], [place]  [address]   Dear [name],  This letter is being sent to you as a reminder that it is necessary for you to get your INR/PT checked regularly so that we can optimize your care.  Our records indicate that you were scheduled to have a test done recently.  As of today, we have not received the results of this test.  It is very important that you have your INR checked.  Please call our office at the number listed above to schedule an appointment at your earliest convenience.    If you have recently had your protime checked or have discontinued this medication, please contact our office at the above phone number to clarify this issue.  Thank you for this prompt attention to this important health care matter.  Sincerely,   [facility] Team    Appended Document: [facility] - Delinquent Coumadin 1 Attempted to call pt and reschedule missed appointment. Pt last seen 11/11.  LMOM TCB to reschedule missed appointment.  Letter sent as well. EWJ

## 2010-07-16 DIAGNOSIS — Z7901 Long term (current) use of anticoagulants: Secondary | ICD-10-CM | POA: Insufficient documentation

## 2010-07-16 DIAGNOSIS — I4891 Unspecified atrial fibrillation: Secondary | ICD-10-CM

## 2010-07-19 ENCOUNTER — Telehealth (INDEPENDENT_AMBULATORY_CARE_PROVIDER_SITE_OTHER): Payer: Self-pay | Admitting: *Deleted

## 2010-07-19 ENCOUNTER — Encounter (INDEPENDENT_AMBULATORY_CARE_PROVIDER_SITE_OTHER): Payer: Managed Care, Other (non HMO)

## 2010-07-19 ENCOUNTER — Encounter: Payer: Self-pay | Admitting: Internal Medicine

## 2010-07-19 DIAGNOSIS — Z7901 Long term (current) use of anticoagulants: Secondary | ICD-10-CM

## 2010-07-19 DIAGNOSIS — I4891 Unspecified atrial fibrillation: Secondary | ICD-10-CM

## 2010-07-19 LAB — CONVERTED CEMR LAB: POC INR: 2.6

## 2010-07-25 NOTE — Progress Notes (Signed)
Summary: Calling about Coumadin Appt.   Phone Note Call from Patient   Caller: Patient Summary of Call: Pt called asking for refills on her Coumadin.  Pt has not been seen in clinic since November.  She has been sent deliquent letters and called multiple times.  She set up a f/u for 2/27 but needs refills for Coumadin.  Explained to pt that we will not be able to refill her Coumadin until she comes in for a visit because she has not had an INR checked in over 3 months.  Pt refused to come in any time sooner.  I offered her any appt time and she still refused.  Stated she would have her other doctor follow up but she hung up the phone before I could ask who that physician would be.   After initial phone call, reviewed pt's refill information.  I called [facility] and verifed that patient actually has a refill available at the pharmacy.  LMOM for pt with this information.  If pt cancels appt, will call and cancel the refill.  Initial call taken by: Porfirio Oar PharmD,  July 19, 2010 12:35 PM

## 2010-07-25 NOTE — Medication Information (Signed)
Summary: rov/tm  Anticoagulant Therapy  Managed by: Shelby Nakayama, RN, BSN Referring MD: Dr Ron Parker PCP: [name], MD Supervising MD: Harrington Challenger MD, Nevin Bloodgood Indication 1: Atrial Fibrillation Lab Used: [facility] [place] Site: [place] INR POC 2.6 INR RANGE 2.0-3.0  Dietary changes: no    Health status changes: no    Bleeding/hemorrhagic complications: no    Recent/future hospitalizations: no    Any changes in medication regimen? no    Recent/future dental: no  Any missed doses?: no       Is patient compliant with meds? yes       Allergies: 1)  ! Codeine  Anticoagulation Management History:      The patient is taking warfarin and comes in today for a routine follow up visit.  Positive risk factors for bleeding include an age of 66 years or older and presence of serious comorbidities.  The bleeding index is 'intermediate risk'.  Positive CHADS2 values include History of CHF and History of Diabetes.  Negative CHADS2 values include Age > 15 years old.  Her last INR was 2.5.  Anticoagulation responsible provider: Harrington Challenger MD, Nevin Bloodgood.  INR POC: 2.6.  Cuvette Lot#: [id number].  Exp: 06/2011.    Anticoagulation Management Assessment/Plan:      The patient's current anticoagulation dose is Warfarin sodium 5 mg tabs: Take as directed by coumadin clinic..  The target INR is 2.0-3.0.  The next INR is due 08/16/2010.  Anticoagulation instructions were given to patient.  Results were reviewed/authorized by Shelby Nakayama, RN, BSN.  She was notified by Shelby Nakayama, RN, BSN.         Prior Anticoagulation Instructions: INR 2.5  Continue current regimen of 7.5mg  every day except take 5mg  on Mondays and Fridays.  Return in 4 weeks.  Current Anticoagulation Instructions: INR 2.6 Continue 1.5 pills everyday except 1 pill on Mondays and Fridays. Recheck in 4 weeks.

## 2010-08-16 ENCOUNTER — Other Ambulatory Visit: Payer: Self-pay | Admitting: Internal Medicine

## 2010-08-16 DIAGNOSIS — Z1231 Encounter for screening mammogram for malignant neoplasm of breast: Secondary | ICD-10-CM

## 2010-08-20 LAB — COMPREHENSIVE METABOLIC PANEL
ALT: 101 U/L — ABNORMAL HIGH (ref 0–35)
ALT: 72 U/L — ABNORMAL HIGH (ref 0–35)
AST: 33 U/L (ref 0–37)
AST: 58 U/L — ABNORMAL HIGH (ref 0–37)
Albumin: 3.2 g/dL — ABNORMAL LOW (ref 3.5–5.2)
Albumin: 3.5 g/dL (ref 3.5–5.2)
Alkaline Phosphatase: 169 U/L — ABNORMAL HIGH (ref 39–117)
Alkaline Phosphatase: 206 U/L — ABNORMAL HIGH (ref 39–117)
BUN: 13 mg/dL (ref 6–23)
BUN: 19 mg/dL (ref 6–23)
CO2: 24 mEq/L (ref 19–32)
CO2: 30 mEq/L (ref 19–32)
Calcium: 9.1 mg/dL (ref 8.4–10.5)
Calcium: 9.2 mg/dL (ref 8.4–10.5)
Chloride: 101 mEq/L (ref 96–112)
Chloride: 99 mEq/L (ref 96–112)
Creatinine, Ser: 1.21 mg/dL — ABNORMAL HIGH (ref 0.4–1.2)
Creatinine, Ser: 1.28 mg/dL — ABNORMAL HIGH (ref 0.4–1.2)
GFR calc Af Amer: 51 mL/min — ABNORMAL LOW (ref >60)
GFR calc Af Amer: 54 mL/min — ABNORMAL LOW (ref >60)
GFR calc non Af Amer: 42 mL/min — ABNORMAL LOW (ref >60)
GFR calc non Af Amer: 45 mL/min — ABNORMAL LOW (ref >60)
Glucose, Bld: 151 mg/dL — ABNORMAL HIGH (ref 70–99)
Glucose, Bld: 202 mg/dL — ABNORMAL HIGH (ref 70–99)
Potassium: 3.2 mEq/L — ABNORMAL LOW (ref 3.5–5.1)
Potassium: 3.8 mEq/L (ref 3.5–5.1)
Sodium: 134 mEq/L — ABNORMAL LOW (ref 135–145)
Sodium: 139 mEq/L (ref 135–145)
Total Bilirubin: 0.8 mg/dL (ref 0.3–1.2)
Total Bilirubin: 1.2 mg/dL (ref 0.3–1.2)
Total Protein: 6.2 g/dL (ref 6.0–8.3)
Total Protein: 6.8 g/dL (ref 6.0–8.3)

## 2010-08-20 LAB — HEPARIN LEVEL (UNFRACTIONATED)
Heparin Unfractionated: 0.26 IU/mL — ABNORMAL LOW (ref 0.30–0.70)
Heparin Unfractionated: 0.3 IU/mL (ref 0.30–0.70)
Heparin Unfractionated: 0.53 IU/mL (ref 0.30–0.70)
Heparin Unfractionated: 0.55 IU/mL (ref 0.30–0.70)
Heparin Unfractionated: 0.67 IU/mL (ref 0.30–0.70)
Heparin Unfractionated: 0.67 IU/mL (ref 0.30–0.70)
Heparin Unfractionated: 0.71 IU/mL — ABNORMAL HIGH (ref 0.30–0.70)
Heparin Unfractionated: 0.72 IU/mL — ABNORMAL HIGH (ref 0.30–0.70)
Heparin Unfractionated: 0.78 IU/mL — ABNORMAL HIGH (ref 0.30–0.70)
Heparin Unfractionated: 0.79 IU/mL — ABNORMAL HIGH (ref 0.30–0.70)

## 2010-08-20 LAB — GLUCOSE, CAPILLARY
Glucose-Capillary: 121 mg/dL — ABNORMAL HIGH (ref 70–99)
Glucose-Capillary: 130 mg/dL — ABNORMAL HIGH (ref 70–99)
Glucose-Capillary: 143 mg/dL — ABNORMAL HIGH (ref 70–99)
Glucose-Capillary: 148 mg/dL — ABNORMAL HIGH (ref 70–99)
Glucose-Capillary: 150 mg/dL — ABNORMAL HIGH (ref 70–99)
Glucose-Capillary: 153 mg/dL — ABNORMAL HIGH (ref 70–99)
Glucose-Capillary: 155 mg/dL — ABNORMAL HIGH (ref 70–99)
Glucose-Capillary: 156 mg/dL — ABNORMAL HIGH (ref 70–99)
Glucose-Capillary: 160 mg/dL — ABNORMAL HIGH (ref 70–99)
Glucose-Capillary: 162 mg/dL — ABNORMAL HIGH (ref 70–99)
Glucose-Capillary: 168 mg/dL — ABNORMAL HIGH (ref 70–99)
Glucose-Capillary: 169 mg/dL — ABNORMAL HIGH (ref 70–99)
Glucose-Capillary: 177 mg/dL — ABNORMAL HIGH (ref 70–99)
Glucose-Capillary: 178 mg/dL — ABNORMAL HIGH (ref 70–99)
Glucose-Capillary: 183 mg/dL — ABNORMAL HIGH (ref 70–99)
Glucose-Capillary: 190 mg/dL — ABNORMAL HIGH (ref 70–99)
Glucose-Capillary: 200 mg/dL — ABNORMAL HIGH (ref 70–99)
Glucose-Capillary: 202 mg/dL — ABNORMAL HIGH (ref 70–99)
Glucose-Capillary: 215 mg/dL — ABNORMAL HIGH (ref 70–99)
Glucose-Capillary: 237 mg/dL — ABNORMAL HIGH (ref 70–99)
Glucose-Capillary: 238 mg/dL — ABNORMAL HIGH (ref 70–99)
Glucose-Capillary: 245 mg/dL — ABNORMAL HIGH (ref 70–99)

## 2010-08-20 LAB — LIPID PANEL
Cholesterol: 149 mg/dL (ref 0–200)
HDL: 31 mg/dL — ABNORMAL LOW (ref >39)
LDL Cholesterol: 104 mg/dL — ABNORMAL HIGH (ref 0–99)
Total CHOL/HDL Ratio: 4.8 RATIO
Triglycerides: 72 mg/dL (ref <150)
VLDL: 14 mg/dL (ref 0–40)

## 2010-08-20 LAB — URINALYSIS, ROUTINE W REFLEX MICROSCOPIC
Bilirubin Urine: NEGATIVE
Glucose, UA: NEGATIVE mg/dL
Hgb urine dipstick: NEGATIVE
Ketones, ur: NEGATIVE mg/dL
Nitrite: NEGATIVE
Protein, ur: NEGATIVE mg/dL
Specific Gravity, Urine: 1.008 (ref 1.005–1.030)
Urobilinogen, UA: 0.2 mg/dL (ref 0.0–1.0)
pH: 5.5 (ref 5.0–8.0)

## 2010-08-20 LAB — BASIC METABOLIC PANEL
BUN: 10 mg/dL (ref 6–23)
BUN: 9 mg/dL (ref 6–23)
CO2: 27 mEq/L (ref 19–32)
CO2: 31 mEq/L (ref 19–32)
Calcium: 9 mg/dL (ref 8.4–10.5)
Calcium: 9.5 mg/dL (ref 8.4–10.5)
Chloride: 100 mEq/L (ref 96–112)
Chloride: 93 mEq/L — ABNORMAL LOW (ref 96–112)
Creatinine, Ser: 1.33 mg/dL — ABNORMAL HIGH (ref 0.4–1.2)
Creatinine, Ser: 1.42 mg/dL — ABNORMAL HIGH (ref 0.4–1.2)
GFR calc Af Amer: 45 mL/min — ABNORMAL LOW (ref >60)
GFR calc Af Amer: 49 mL/min — ABNORMAL LOW (ref >60)
GFR calc non Af Amer: 37 mL/min — ABNORMAL LOW (ref >60)
GFR calc non Af Amer: 40 mL/min — ABNORMAL LOW (ref >60)
Glucose, Bld: 156 mg/dL — ABNORMAL HIGH (ref 70–99)
Glucose, Bld: 202 mg/dL — ABNORMAL HIGH (ref 70–99)
Potassium: 3.5 mEq/L (ref 3.5–5.1)
Potassium: 3.9 mEq/L (ref 3.5–5.1)
Sodium: 136 mEq/L (ref 135–145)
Sodium: 138 mEq/L (ref 135–145)

## 2010-08-20 LAB — CBC
HCT: 35.7 % — ABNORMAL LOW (ref 36.0–46.0)
HCT: 35.9 % — ABNORMAL LOW (ref 36.0–46.0)
HCT: 36.9 % (ref 36.0–46.0)
HCT: 37.4 % (ref 36.0–46.0)
HCT: 38.5 % (ref 36.0–46.0)
HCT: 39.9 % (ref 36.0–46.0)
Hemoglobin: 11.5 g/dL — ABNORMAL LOW (ref 12.0–15.0)
Hemoglobin: 11.8 g/dL — ABNORMAL LOW (ref 12.0–15.0)
Hemoglobin: 11.9 g/dL — ABNORMAL LOW (ref 12.0–15.0)
Hemoglobin: 12 g/dL (ref 12.0–15.0)
Hemoglobin: 12.3 g/dL (ref 12.0–15.0)
Hemoglobin: 13 g/dL (ref 12.0–15.0)
MCHC: 31.4 g/dL (ref 30.0–36.0)
MCHC: 32 g/dL (ref 30.0–36.0)
MCHC: 32.3 g/dL (ref 30.0–36.0)
MCHC: 32.5 g/dL (ref 30.0–36.0)
MCHC: 32.6 g/dL (ref 30.0–36.0)
MCHC: 33.1 g/dL (ref 30.0–36.0)
MCV: 79.4 fL (ref 78.0–100.0)
MCV: 80 fL (ref 78.0–100.0)
MCV: 81.2 fL (ref 78.0–100.0)
MCV: 82.4 fL (ref 78.0–100.0)
MCV: 82.6 fL (ref 78.0–100.0)
MCV: 83 fL (ref 78.0–100.0)
Platelets: 200 10*3/uL (ref 150–400)
Platelets: 210 10*3/uL (ref 150–400)
Platelets: 213 10*3/uL (ref 150–400)
Platelets: 217 10*3/uL (ref 150–400)
Platelets: 222 10*3/uL (ref 150–400)
Platelets: 240 10*3/uL (ref 150–400)
RBC: 4.36 MIL/uL (ref 3.87–5.11)
RBC: 4.46 MIL/uL (ref 3.87–5.11)
RBC: 4.51 MIL/uL (ref 3.87–5.11)
RBC: 4.54 MIL/uL (ref 3.87–5.11)
RBC: 4.66 MIL/uL (ref 3.87–5.11)
RBC: 5.03 MIL/uL (ref 3.87–5.11)
RDW: 14.6 % (ref 11.5–15.5)
RDW: 14.7 % (ref 11.5–15.5)
RDW: 14.9 % (ref 11.5–15.5)
RDW: 14.9 % (ref 11.5–15.5)
RDW: 15.1 % (ref 11.5–15.5)
RDW: 15.3 % (ref 11.5–15.5)
WBC: 7.3 10*3/uL (ref 4.0–10.5)
WBC: 7.5 10*3/uL (ref 4.0–10.5)
WBC: 8 10*3/uL (ref 4.0–10.5)
WBC: 8.5 10*3/uL (ref 4.0–10.5)
WBC: 8.8 10*3/uL (ref 4.0–10.5)
WBC: 9.2 10*3/uL (ref 4.0–10.5)

## 2010-08-20 LAB — CARDIAC PANEL(CRET KIN+CKTOT+MB+TROPI)
CK, MB: 2.6 ng/mL (ref 0.3–4.0)
CK, MB: 2.8 ng/mL (ref 0.3–4.0)
CK, MB: 2.8 ng/mL (ref 0.3–4.0)
Relative Index: 1.8 (ref 0.0–2.5)
Relative Index: 2 (ref 0.0–2.5)
Relative Index: 2 (ref 0.0–2.5)
Total CK: 133 U/L (ref 7–177)
Total CK: 142 U/L (ref 7–177)
Total CK: 160 U/L (ref 7–177)
Troponin I: 0.04 ng/mL (ref 0.00–0.06)
Troponin I: 0.04 ng/mL (ref 0.00–0.06)
Troponin I: 0.05 ng/mL (ref 0.00–0.06)

## 2010-08-20 LAB — CK TOTAL AND CKMB (NOT AT ARMC)
CK, MB: 3 ng/mL (ref 0.3–4.0)
Relative Index: 2.2 (ref 0.0–2.5)
Total CK: 139 U/L (ref 7–177)

## 2010-08-20 LAB — HEMOGLOBIN A1C
Hgb A1c MFr Bld: 10.8 % — ABNORMAL HIGH (ref 4.6–6.1)
Hgb A1c MFr Bld: 11.3 % — ABNORMAL HIGH (ref 4.6–6.1)
Mean Plasma Glucose: 263 mg/dL
Mean Plasma Glucose: 278 mg/dL

## 2010-08-20 LAB — DIFFERENTIAL
Basophils Absolute: 0.1 10*3/uL (ref 0.0–0.1)
Basophils Relative: 1 % (ref 0–1)
Eosinophils Absolute: 0.1 10*3/uL (ref 0.0–0.7)
Eosinophils Relative: 1 % (ref 0–5)
Lymphocytes Relative: 22 % (ref 12–46)
Lymphs Abs: 2 10*3/uL (ref 0.7–4.0)
Monocytes Absolute: 0.4 10*3/uL (ref 0.1–1.0)
Monocytes Relative: 4 % (ref 3–12)
Neutro Abs: 6.3 10*3/uL (ref 1.7–7.7)
Neutrophils Relative %: 71 % (ref 43–77)

## 2010-08-20 LAB — APTT: aPTT: 29 seconds (ref 24–37)

## 2010-08-20 LAB — T4, FREE: Free T4: 1.54 ng/dL (ref 0.80–1.80)

## 2010-08-20 LAB — BRAIN NATRIURETIC PEPTIDE
Pro B Natriuretic peptide (BNP): 118 pg/mL — ABNORMAL HIGH (ref 0.0–100.0)
Pro B Natriuretic peptide (BNP): 244 pg/mL — ABNORMAL HIGH (ref 0.0–100.0)

## 2010-08-20 LAB — STOOL CULTURE

## 2010-08-20 LAB — PROTIME-INR
INR: 1 (ref 0.00–1.49)
Prothrombin Time: 13.1 seconds (ref 11.6–15.2)

## 2010-08-20 LAB — CULTURE, BLOOD (ROUTINE X 2)
Culture: NO GROWTH
Culture: NO GROWTH

## 2010-08-20 LAB — HEPATITIS PANEL, ACUTE
HCV Ab: NEGATIVE
Hep A IgM: NEGATIVE
Hep B C IgM: NEGATIVE
Hepatitis B Surface Ag: NEGATIVE

## 2010-08-20 LAB — URINE CULTURE
Colony Count: 9000
Special Requests: NEGATIVE

## 2010-08-20 LAB — CLOSTRIDIUM DIFFICILE EIA: C difficile Toxins A+B, EIA: NEGATIVE

## 2010-08-20 LAB — TROPONIN I: Troponin I: 0.07 ng/mL — ABNORMAL HIGH (ref 0.00–0.06)

## 2010-08-20 LAB — TSH
TSH: 2.866 u[IU]/mL (ref 0.350–4.500)
TSH: 3.225 u[IU]/mL (ref 0.350–4.500)

## 2010-08-20 LAB — LIPASE, BLOOD: Lipase: 19 U/L (ref 11–59)

## 2010-08-20 LAB — D-DIMER, QUANTITATIVE (NOT AT ARMC): D-Dimer, Quant: 1.78 ug/mL-FEU — ABNORMAL HIGH (ref 0.00–0.48)

## 2010-08-23 ENCOUNTER — Ambulatory Visit (INDEPENDENT_AMBULATORY_CARE_PROVIDER_SITE_OTHER): Payer: Managed Care, Other (non HMO) | Admitting: *Deleted

## 2010-08-23 DIAGNOSIS — Z7901 Long term (current) use of anticoagulants: Secondary | ICD-10-CM

## 2010-08-23 DIAGNOSIS — I4891 Unspecified atrial fibrillation: Secondary | ICD-10-CM

## 2010-08-23 LAB — POCT INR: INR: 2.5

## 2010-08-23 NOTE — Patient Instructions (Signed)
INR 2.5  Continue current dosage: 1.5 tablets (7.5mg ) every day, except for Monday and Friday take 1 tablet (5mg )  Recheck INR in 4 weeks

## 2010-08-24 ENCOUNTER — Ambulatory Visit: Payer: Managed Care, Other (non HMO)

## 2010-08-26 LAB — GLUCOSE, CAPILLARY: Glucose-Capillary: 118 mg/dL — ABNORMAL HIGH (ref 70–99)

## 2010-09-14 ENCOUNTER — Encounter: Payer: Self-pay | Admitting: Cardiology

## 2010-09-17 ENCOUNTER — Encounter: Payer: Self-pay | Admitting: Cardiology

## 2010-09-17 DIAGNOSIS — I071 Rheumatic tricuspid insufficiency: Secondary | ICD-10-CM | POA: Insufficient documentation

## 2010-09-17 DIAGNOSIS — R945 Abnormal results of liver function studies: Secondary | ICD-10-CM

## 2010-09-17 DIAGNOSIS — E079 Disorder of thyroid, unspecified: Secondary | ICD-10-CM | POA: Insufficient documentation

## 2010-09-17 DIAGNOSIS — R7989 Other specified abnormal findings of blood chemistry: Secondary | ICD-10-CM | POA: Insufficient documentation

## 2010-09-17 DIAGNOSIS — I480 Paroxysmal atrial fibrillation: Secondary | ICD-10-CM | POA: Insufficient documentation

## 2010-09-18 ENCOUNTER — Ambulatory Visit (INDEPENDENT_AMBULATORY_CARE_PROVIDER_SITE_OTHER): Payer: Managed Care, Other (non HMO) | Admitting: *Deleted

## 2010-09-18 ENCOUNTER — Encounter: Payer: Self-pay | Admitting: Cardiology

## 2010-09-18 ENCOUNTER — Ambulatory Visit (INDEPENDENT_AMBULATORY_CARE_PROVIDER_SITE_OTHER): Payer: Managed Care, Other (non HMO) | Admitting: Cardiology

## 2010-09-18 ENCOUNTER — Ambulatory Visit
Admission: RE | Admit: 2010-09-18 | Discharge: 2010-09-18 | Disposition: A | Discharge status: Home / Self Care | Payer: Managed Care, Other (non HMO) | Source: Ambulatory Visit | Attending: Internal Medicine | Admitting: Internal Medicine

## 2010-09-18 DIAGNOSIS — I428 Other cardiomyopathies: Secondary | ICD-10-CM

## 2010-09-18 DIAGNOSIS — I340 Nonrheumatic mitral (valve) insufficiency: Secondary | ICD-10-CM

## 2010-09-18 DIAGNOSIS — R0989 Other specified symptoms and signs involving the circulatory and respiratory systems: Secondary | ICD-10-CM

## 2010-09-18 DIAGNOSIS — R9430 Abnormal result of cardiovascular function study, unspecified: Secondary | ICD-10-CM

## 2010-09-18 DIAGNOSIS — Z7901 Long term (current) use of anticoagulants: Secondary | ICD-10-CM

## 2010-09-18 DIAGNOSIS — I059 Rheumatic mitral valve disease, unspecified: Secondary | ICD-10-CM

## 2010-09-18 DIAGNOSIS — I4891 Unspecified atrial fibrillation: Secondary | ICD-10-CM

## 2010-09-18 DIAGNOSIS — Z1231 Encounter for screening mammogram for malignant neoplasm of breast: Secondary | ICD-10-CM

## 2010-09-18 LAB — POCT INR: INR: 2.8

## 2010-09-18 MED ORDER — WARFARIN SODIUM 5 MG PO TABS: 5.0000 mg | ORAL_TABLET | ORAL | Status: DC

## 2010-09-18 NOTE — Assessment & Plan Note (Signed)
She is holding normal sinus rhythm.  I'm quite pleased about this.  No change in therapy.  Coumadin is to be continued.

## 2010-09-18 NOTE — Progress Notes (Signed)
HPI Patient is seen for cardiology followup including atrial fibrillation and cardiomyopathy.  I had seen her last March, 2011.  At that time she had atrial fibrillation and decision was made to proceed with cardioversion.  This was done as an outpatient and it was successful.  Originally my plan had been to see her back for followup to see if her LV function improved over time.  At followup visit has been delayed but she is now here and stable.  The patient has smoked as much as a pack and a half a day in the past but currently is down to 5 cigarettes per day.  She's not having chest pain or shortness of breath. S she follows carefully with Dr. Baird Cancer.  Allergies  Allergen Reactions  . Codeine     Current Outpatient Prescriptions  Medication Sig Dispense Refill  . diltiazem (DILACOR XR) 120 MG 24 hr capsule Take 120 mg by mouth daily.        . enalapril (VASOTEC) 5 MG tablet Take 5 mg by mouth 2 (two) times daily.        . furosemide (LASIX) 40 MG tablet Take 40 mg by mouth daily.        . insulin aspart (NOVOLOG) 100 UNIT/ML injection 15 units daily       . Liraglutide (VICTOZA) 18 MG/3ML SOLN Inject into the skin daily.        Marland Kitchen omeprazole (PRILOSEC) 20 MG capsule Take 20 mg by mouth daily.        . potassium chloride SA (K-DUR,KLOR-CON) 20 MEQ tablet Take 20 mEq by mouth daily.        Marland Kitchen warfarin (COUMADIN) 5 MG tablet Take 1 tablet (5 mg total) by mouth as directed.  50 tablet  3  . DISCONTD: glipiZIDE (GLUCOTROL) 2.5 MG 24 hr tablet Take 2.5 mg by mouth daily.        Marland Kitchen DISCONTD: warfarin (COUMADIN) 5 MG tablet Take by mouth as directed.          History   Social History  . Marital Status: Divorced    Spouse Name: N/A    Number of Children: N/A  . Years of Education: N/A   Occupational History  . Not on file.   Social History Main Topics  . Smoking status: Current Everyday Smoker  . Smokeless tobacco: Not on file  . Alcohol Use: No  . Drug Use: No  . Sexually Active: Not on  file   Other Topics Concern  . Not on file   Social History Narrative  . No narrative on file    Family History  Problem Relation Age of Onset  . Colon cancer    . Heart disease      Past Medical History  Diagnosis Date  . Hyperkalemia   . Overweight   . CHF (congestive heart failure)   . HLD (hyperlipidemia)   . DM2 (diabetes mellitus, type 2)   . Depression   . History of bronchitis   . History of emphysema   . Atrial fibrillation     Cardioverted to NSR 08/22/2009  . CKD (chronic kidney disease)   . Mitral regurgitation     mild - echo - 1/11  . Tricuspid regurgitation     moderate - echo - 1/11  . Drug therapy 1/11    coumadin  . Thyroid dysfunction     thyromegally,diffuse,nodule LLL,06/2009  . Ejection fraction < 50%     20-25%, echo.06/2009  .  LFT elevation     06/2009    Past Surgical History  Procedure Date  . Cholecystectomy   . Hysterectomy unknow   . Tonsillectomy   . Breast biopsy     ROS  Patient denies fever, chills, headache, sweats, rash, change in vision, change in hearing, chest pain, cough, nausea vomiting, urinary symptoms.  All other systems are reviewed and are negative.  PHYSICAL EXAM Patient is overweight but stable.  She is oriented to person time and place.  Affect is normal.  Head is atraumatic.  There is no xanthelasma.  There is no jugular venous distention.  Lungs are clear.  Respiratory effort is unlabored.  Cardiac exam reveals an S1-S2.  There are no clicks or significant murmurs.  The abdomen is obese but soft.  There is no peripheral edema.  There are no musculoskeletal deformities.  There are no skin rashes.  Filed Vitals:   09/18/10 0938  BP: 142/84  Pulse: 73  Resp: 14  Height: 5\' 9"  (1.753 m)  Weight: 282 lb (127.914 kg)    EKG EKG is done today and reviewed by me.  She is maintaining normal sinus rhythm.  ASSESSMENT & PLAN

## 2010-09-18 NOTE — Patient Instructions (Signed)
Your physician recommends that you schedule a follow-up appointment in: 6 months with dr Ron Parker Your physician has requested that you have an echocardiogram. Echocardiography is a painless test that uses sound waves to create images of your heart. It provides your doctor with information about the size and shape of your heart and how well your heart's Teaster and valves are working. This procedure takes approximately one hour. There are no restrictions for this procedure.

## 2010-09-18 NOTE — Assessment & Plan Note (Signed)
This was mild in the past.  Will gather more information from her followup echo.

## 2010-09-18 NOTE — Assessment & Plan Note (Signed)
In January, 2011 the patient had an ejection fraction in the 25% range.  She has been back in sinus rhythm.  I am hopeful that she's had improvement in LV function.  He needs a 2-D echo.  He'll be in touch with her with information.  Based on that information we will decide what other medication changes she may or may not need.  She does not have any clinical heart failure at this time.

## 2010-09-18 NOTE — Assessment & Plan Note (Signed)
Patient continues on Coumadin.  This is appropriate.

## 2010-09-20 ENCOUNTER — Encounter: Payer: Managed Care, Other (non HMO) | Admitting: *Deleted

## 2010-10-16 ENCOUNTER — Ambulatory Visit (HOSPITAL_COMMUNITY): Payer: Managed Care, Other (non HMO) | Attending: Internal Medicine | Admitting: Radiology

## 2010-10-16 ENCOUNTER — Ambulatory Visit (INDEPENDENT_AMBULATORY_CARE_PROVIDER_SITE_OTHER): Payer: Managed Care, Other (non HMO) | Admitting: *Deleted

## 2010-10-16 DIAGNOSIS — I509 Heart failure, unspecified: Secondary | ICD-10-CM

## 2010-10-16 DIAGNOSIS — I4891 Unspecified atrial fibrillation: Secondary | ICD-10-CM

## 2010-10-16 DIAGNOSIS — Z7901 Long term (current) use of anticoagulants: Secondary | ICD-10-CM

## 2010-10-16 DIAGNOSIS — R9430 Abnormal result of cardiovascular function study, unspecified: Secondary | ICD-10-CM

## 2010-10-16 DIAGNOSIS — I428 Other cardiomyopathies: Secondary | ICD-10-CM

## 2010-10-16 DIAGNOSIS — I059 Rheumatic mitral valve disease, unspecified: Secondary | ICD-10-CM | POA: Insufficient documentation

## 2010-10-16 DIAGNOSIS — I079 Rheumatic tricuspid valve disease, unspecified: Secondary | ICD-10-CM | POA: Insufficient documentation

## 2010-10-16 LAB — POCT INR: INR: 2.5

## 2010-10-19 NOTE — Assessment & Plan Note (Signed)
[name]                             PRIMARY CARE OFFICE NOTE   Shelby, Little                      MRN:          [id number]  DATE:02/04/2006                            DOB:          1945-12-31    [name] is a 65 year old African American woman who had been seen in  the past by Dr. Tanda Rockers.  She has no primary care physician in our  practice and has recently been discharged by Dr. Deland Pretty for failure to  show for appointments.   The patient reports she had the onset at 3 o'clock this morning of  hematochezia.  This felt like she was having a bowel movement and she passed  a large amount of blood.  Patient had a second episode about 5:15 a.m.,  again passing blood and clot and then at 8 o'clock she had another dark  stool.  Patient does have a history of colonoscopy 10 years ago with no  polyps but did have diverticulosis by her report.  She does take aspirin but  no other anti-inflammatory drugs.   PAST MEDICAL HISTORY:  1. Usual childhood diseases.  2. Diabetes.  3. Atrial fibrillation status post ablation procedure in 1999.  4. Reflux.  5. Depression.  6. Emphysema/bronchitis.  7. Hyperlipidemia.   PAST SURGICAL HISTORY:  1. Tonsillectomy, remote.  2. Hysterectomy.  3. Cholecystectomy.  4. Breast biopsy on the right.   PHYSICIAN ROSTER:  1. Christena Deem. Melvyn Novas, MD, FCCP, pulmonary.  2. Dr. Elliot Gault, endocrine/diabetes.   CURRENT MEDICATION:  Glipizide/metformin 2.5/500 b.i.d.  She takes no other  medications.   ALLERGIES:  NO KNOWN DRUG ALLERGIES.   SOCIAL HISTORY:  Patient works as a Physicist, medical.  She is married.   Chart review indicates she had a cardiac catheterization in 1997 by Dr.  Montez Morita which was evidently negative of any coronary artery disease.   FAMILY HISTORY:  Negative for respiratory disease or clotting disorders.  Arthritis in her parents.  Breast cancer in her mother.  Heart  disease  in  parents.  Stroke in her parents.  Hypertension in her parents.  Diabetes in  her parents.  Did not get further information.   REVIEW OF SYSTEMS:  Patient has had no fevers, sweats or chills.  She has  had no shortness of breath.  She has had no chest pain or chest discomfort.   PHYSICAL EXAMINATION:  GENERAL APPEARANCE:  This is an obese black female in  no acute distress.  VITAL SIGNS:  Temperature 97.8, blood pressure 151/90, pulse 91, weight 330  pounds.   ANOSCOPY:  Patient had normal rectum and anus.  The anoscope was introduced  without difficulty.  There was clearly visible dark clot above the anoscope.  No hemorrhoids were seen on withdrawal of the scope.   IMPRESSION AND PLAN:  Diverticular bleed.  Patient with a history of  diverticulosis, now with most likely diverticular bleed.  She seems  medically stable at this time with stable hemodynamics.  Plan:  Patient to  laboratory for hemoglobin and hematocrit.  Patient  is advised if she has  persistent bleeding that she would need to notify the office and we would  then recheck a hemoglobin and hematocrit.  If it continues to bleed, she  would need to be hospitalized at some point.  I did reassure the patient  that this is generally a self-limited problem.  The patient has asked if I  would become her primary care physician and I have agreed to that at this  point.   Patient will return to see me in 2-3 weeks for follow-up, sooner if she  continues to have hematochezia.                                   [name], MD   MEN/MedQ  DD:  02/04/2006  DT:  02/04/2006  Job #:  [id number]

## 2010-10-19 NOTE — Op Note (Signed)
[name], [name]                         ACCOUNT NO.:  [id number]   MEDICAL RECORD NO.:  [id number]                   PATIENT TYPE:  OIB   LOCATION:  2870                                 FACILITY:  [facility]   PHYSICIAN:  Garlan Fair., M.D.         DATE OF BIRTH:  10-14-1945   DATE OF PROCEDURE:  11/22/2003  DATE OF DISCHARGE:  11/21/2003                                 OPERATIVE REPORT   PREOPERATIVE DIAGNOSIS:  Mature cataract, right eye.   POSTOPERATIVE DIAGNOSIS:  Mature cataract, right eye.   OPERATION:  Extracapsular cataract extraction with intraocular lens  implantation.   ANESTHESIA:  Local using Xylocaine 2% with Marcaine 0.75%.   JUSTIFICATION OF PROCEDURE:  This is a 65 year old lady who complains of  being unable to see from the right eye for approximately a year.  She is  evaluated and found to have a dense, white mature cataract of the right eye  with a visual acuity best corrected to 20/400.  She has a lesser cataract of  the left eye with visual acuity best corrected at 20/40.  Cataract  extraction with intraocular lens implantation of the right eye is  recommended.  She is admitted at this time for the procedure.   PROCEDURE:  Under the influence of IV sedation, a Van Lint akinesia and  retrobulbar anesthesia was given.  The patient was prepped and draped in the  usual manner.  The lid speculum was inserted under the upper and lower lid  of the right eye and a 4-0 silk traction suture was passed through the belly  of the superior rectus muscle for traction.  A fornix based conjunctival  flap was turned and hemostasis achieved using cautery.  An incision was made  in the sclera at the limbus using a crescent blade.  A side port  incision  was made at the 1:30 o'clock position.  OcuCoat was injected through this  side port incision at the 1:30 o'clock position.  The anterior chamber was  then entered through the corneoscleral tunnel incision at the  11:30 o'clock  position.  An anterior capsulotomy was done using a bent 25 gauge needle.  The nucleus was hydrodissected using Xylocaine.  It was decided at this  point that the nucleus just appeared to be too hard, therefore the procedure  was converted to an extracapsular procedure.  The corneoscleral wound was  then extended first toward the left and toward the right placing a single 8-  0 Vicryl suture across each side of the incision as it was made toward the  left and toward the right.  The nucleus was then manually expressed from the  eye.  The two previously placed sutures were closed and a third one was  placed at the 12 o'clock position.  The IA handpiece was passed into the eye  and the residual cortical material was aspirated.  The posterior capsule was  polished using  olive tipped polisher.  A EZ60 posterior chamber was seated  into the eye behind the iris without difficulty.  The anterior chamber was  reformed and the pupil was constricted using Miochol.  The corneoscleral  wound was closed using a combination of interrupted sutures of 8-0 Vicryl  and 10-0 nylon.  After ascertaining that the wound was air tight and  watertight, the conjunctiva was closed using thermal cautery.  1 mL of  Celestone and 0.5 mL of gentamicin were injected subconjuctivally.  Maxitrol  ophthalmic ointment and Xylocaine ointment were applied along with a patch  and a Fox shield.  The patient tolerated the procedure well and was  discharged to the post anesthesia recovery room in satisfactory condition.  She is instructed to rest today, to take Vicodin every 4 hours as needed for  pain, and to see me in the office tomorrow for further evaluation.   DISCHARGE DIAGNOSIS:  Mature cataract, right eye.                                               Garlan Fair., M.D.    TB/MEDQ  D:  11/22/2003  T:  11/23/2003  Job:  [id number]

## 2010-10-26 ENCOUNTER — Ambulatory Visit: Payer: Managed Care, Other (non HMO) | Admitting: Cardiology

## 2010-10-30 ENCOUNTER — Encounter: Payer: Self-pay | Admitting: Cardiology

## 2010-10-30 DIAGNOSIS — I340 Nonrheumatic mitral (valve) insufficiency: Secondary | ICD-10-CM | POA: Insufficient documentation

## 2010-11-02 ENCOUNTER — Encounter: Payer: Self-pay | Admitting: Cardiology

## 2010-11-02 ENCOUNTER — Ambulatory Visit (INDEPENDENT_AMBULATORY_CARE_PROVIDER_SITE_OTHER): Payer: Managed Care, Other (non HMO) | Admitting: Cardiology

## 2010-11-02 DIAGNOSIS — I059 Rheumatic mitral valve disease, unspecified: Secondary | ICD-10-CM

## 2010-11-02 DIAGNOSIS — I428 Other cardiomyopathies: Secondary | ICD-10-CM

## 2010-11-02 DIAGNOSIS — I340 Nonrheumatic mitral (valve) insufficiency: Secondary | ICD-10-CM

## 2010-11-02 DIAGNOSIS — I4891 Unspecified atrial fibrillation: Secondary | ICD-10-CM

## 2010-11-02 MED ORDER — CARVEDILOL 6.25 MG PO TABS: 6.2500 mg | ORAL_TABLET | Freq: Two times a day (BID) | ORAL | Status: DC

## 2010-11-02 NOTE — Assessment & Plan Note (Signed)
The patient's ejection fraction has improved somewhat.  Unfortunately is still reduced.  She is not having significant symptoms.  It is darkly she was on diltiazem because she had rapid atrial fibrillation.  She is holding stable.  We will change her diltiazem to carvedilol and try to titrate the dose up hoping that we will see improvement in LV function.

## 2010-11-02 NOTE — Patient Instructions (Signed)
Stop Diltiazem Start Carvedilol 6.25 mg, take 1/2 tab Twice daily for 1 week then increase to 1 tab Twice daily  Your physician recommends that you schedule a follow-up appointment in: 3 weeks

## 2010-11-02 NOTE — Assessment & Plan Note (Signed)
Mitral regurgitation is mild.  No change in therapy.  No further workup.

## 2010-11-02 NOTE — Assessment & Plan Note (Signed)
Patient is holding sinus rhythm.  Diltiazem will be changed to carvedilol.

## 2010-11-02 NOTE — Progress Notes (Signed)
HPI Patient is seen today for followup of atrial fibrillation and cardiomyopathy.  I saw her last it was 17, 2012.  She's been cardioverted.  This was successful and she is held sinus rhythm since her last visit.  I hope that her LV function would improve significantly in sinus rhythm.  Followup two-dimensional echo was done after her last visit.  I have reviewed it completely.  Ejection fraction is 30-35%.  There is mild mitral regurgitation.  They show some improvement in her LV function but certainly not back to normal.  She is not having any chest pain or shortness of breath.  She continues to smoke. Allergies  Allergen Reactions  . Codeine     Current Outpatient Prescriptions  Medication Sig Dispense Refill  . diltiazem (DILACOR XR) 120 MG 24 hr capsule Take 120 mg by mouth daily.        . enalapril (VASOTEC) 5 MG tablet Take 5 mg by mouth 2 (two) times daily.        . furosemide (LASIX) 40 MG tablet Take 40 mg by mouth daily.        . insulin aspart (NOVOLOG) 100 UNIT/ML injection 15 units daily       . Liraglutide (VICTOZA) 18 MG/3ML SOLN Inject into the skin daily.        Marland Kitchen omeprazole (PRILOSEC) 20 MG capsule Take 20 mg by mouth daily.        . potassium chloride SA (K-DUR,KLOR-CON) 20 MEQ tablet Take 20 mEq by mouth daily.        Marland Kitchen warfarin (COUMADIN) 5 MG tablet Take 1 tablet (5 mg total) by mouth as directed.  50 tablet  3    History   Social History  . Marital Status: Divorced    Spouse Name: N/A    Number of Children: N/A  . Years of Education: N/A   Occupational History  . Not on file.   Social History Main Topics  . Smoking status: Current Everyday Smoker  . Smokeless tobacco: Not on file  . Alcohol Use: No  . Drug Use: No  . Sexually Active: Not on file   Other Topics Concern  . Not on file   Social History Narrative  . No narrative on file    Family History  Problem Relation Age of Onset  . Colon cancer    . Heart disease      Past Medical History    Diagnosis Date  . Hyperkalemia   . Overweight   . CHF (congestive heart failure)   . HLD (hyperlipidemia)   . DM2 (diabetes mellitus, type 2)   . Depression   . History of bronchitis   . History of emphysema   . Atrial fibrillation     Cardioverted to NSR 08/22/2009  . CKD (chronic kidney disease)   . Mitral regurgitation     mild - echo - 1/11 /  Mild, echo, May15, 2012  . Tricuspid regurgitation     moderate - echo - 1/11  . Drug therapy 1/11    coumadin  . Thyroid dysfunction     thyromegally,diffuse,nodule LLL,06/2009  . Ejection fraction < 50%     20-25%, echo.06/2009, /  EF 30-35%, diffuse hypokinesis, echo, Oct 16, 2010  . LFT elevation     06/2009    Past Surgical History  Procedure Date  . Cholecystectomy   . Hysterectomy unknow   . Tonsillectomy   . Breast biopsy     ROS  Patient  denies fever, chills, headache, sweats, rash, change in vision, change in hearing, chest pain, cough, nausea vomiting, urinary symptoms.  All other systems are reviewed and are negative  PHYSICAL EXAM Patient is overweight.  She is stable.  She does smell of cigarette smoke.  She is oriented to person time and place.  Affect is normal.  Head is atraumatic.  There is no jugular venous distention.  Lungs are clear.  Respiratory effort is nonlabored.  Cardiac exam reveals muscle and S2.  There are no clicks or significant murmurs.  The abdomen is soft.  There is no peripheral edema.  There is no musculoskeletal deformities.  There are no skin rashes. Filed Vitals:   11/02/10 1051  BP: 122/72  Pulse: 84  Resp: 18  Height: 5\' 9"  (1.753 m)  Weight: 283 lb 6.4 oz (128.549 kg)    EKG is done and reviewed by me.  She is holding sinus rhythm  ASSESSMENT & PLAN

## 2010-11-07 ENCOUNTER — Encounter: Payer: Self-pay | Admitting: Cardiology

## 2010-11-12 ENCOUNTER — Ambulatory Visit (INDEPENDENT_AMBULATORY_CARE_PROVIDER_SITE_OTHER): Payer: Managed Care, Other (non HMO) | Admitting: *Deleted

## 2010-11-12 DIAGNOSIS — Z7901 Long term (current) use of anticoagulants: Secondary | ICD-10-CM

## 2010-11-12 DIAGNOSIS — I4891 Unspecified atrial fibrillation: Secondary | ICD-10-CM

## 2010-11-12 LAB — POCT INR: INR: 3.3

## 2010-11-30 ENCOUNTER — Ambulatory Visit: Payer: Managed Care, Other (non HMO) | Admitting: Cardiology

## 2010-12-07 ENCOUNTER — Ambulatory Visit (INDEPENDENT_AMBULATORY_CARE_PROVIDER_SITE_OTHER): Payer: Managed Care, Other (non HMO) | Admitting: *Deleted

## 2010-12-07 ENCOUNTER — Encounter: Payer: Self-pay | Admitting: Cardiology

## 2010-12-07 ENCOUNTER — Ambulatory Visit (INDEPENDENT_AMBULATORY_CARE_PROVIDER_SITE_OTHER): Payer: Managed Care, Other (non HMO) | Admitting: Cardiology

## 2010-12-07 DIAGNOSIS — Z7901 Long term (current) use of anticoagulants: Secondary | ICD-10-CM

## 2010-12-07 DIAGNOSIS — I4891 Unspecified atrial fibrillation: Secondary | ICD-10-CM

## 2010-12-07 DIAGNOSIS — I428 Other cardiomyopathies: Secondary | ICD-10-CM

## 2010-12-07 LAB — POCT INR: INR: 2.3

## 2010-12-07 MED ORDER — ENALAPRIL MALEATE 10 MG PO TABS: 10.0000 mg | ORAL_TABLET | Freq: Two times a day (BID) | ORAL | Status: DC

## 2010-12-07 NOTE — Assessment & Plan Note (Signed)
Is stable.  No change in therapy.

## 2010-12-07 NOTE — Progress Notes (Signed)
HPI Patient is seen for follow up of cardiomyopathy.  I saw her last November 02, 2010.  Diltiazem was stopped that day and carvedilol started.  Originally diltiazem was used for atrial fibrillation.  Patient is stable and carvedilol.  With the extreme heat we have been having she feels fatigued.  I'm not convinced it is related to carvedilol I am hesitant to increase her dose today. Allergies  Allergen Reactions  . Codeine     Current Outpatient Prescriptions  Medication Sig Dispense Refill  . carvedilol (COREG) 6.25 MG tablet Take 1 tablet (6.25 mg total) by mouth 2 (two) times daily.  60 tablet  6  . enalapril (VASOTEC) 5 MG tablet Take 5 mg by mouth 2 (two) times daily.        . furosemide (LASIX) 40 MG tablet Take 40 mg by mouth daily.        . insulin aspart (NOVOLOG) 100 UNIT/ML injection 15 units daily       . Liraglutide (VICTOZA) 18 MG/3ML SOLN Inject into the skin daily.        . potassium chloride SA (K-DUR,KLOR-CON) 20 MEQ tablet Take 20 mEq by mouth daily.        Marland Kitchen warfarin (COUMADIN) 5 MG tablet Take 1 tablet (5 mg total) by mouth as directed.  50 tablet  3  . DISCONTD: omeprazole (PRILOSEC) 20 MG capsule Take 20 mg by mouth daily.          History   Social History  . Marital Status: Divorced    Spouse Name: N/A    Number of Children: N/A  . Years of Education: N/A   Occupational History  . Not on file.   Social History Main Topics  . Smoking status: Current Everyday Smoker  . Smokeless tobacco: Not on file  . Alcohol Use: No  . Drug Use: No  . Sexually Active: Not on file   Other Topics Concern  . Not on file   Social History Narrative  . No narrative on file    Family History  Problem Relation Age of Onset  . Colon cancer    . Heart disease      Past Medical History  Diagnosis Date  . Hyperkalemia   . Overweight   . CHF (congestive heart failure)   . HLD (hyperlipidemia)   . DM2 (diabetes mellitus, type 2)   . Depression   . History of bronchitis    . History of emphysema   . Atrial fibrillation     Cardioverted to NSR 08/22/2009  . CKD (chronic kidney disease)   . Mitral regurgitation     mild - echo - 1/11 /  Mild, echo, May15, 2012  . Tricuspid regurgitation     moderate - echo - 1/11  . Drug therapy 1/11    coumadin  . Thyroid dysfunction     thyromegally,diffuse,nodule LLL,06/2009  . Ejection fraction < 50%     20-25%, echo.06/2009, /  EF 30-35%, diffuse hypokinesis, echo, Oct 16, 2010  . LFT elevation     06/2009  . Cardiomyopathy     Past Surgical History  Procedure Date  . Cholecystectomy   . Hysterectomy unknow   . Tonsillectomy   . Breast biopsy     ROS  Patient denies fever, chills, headache, sweats, rash, change in vision, change in hearing, chest pain, cough, nausea vomiting, urinary symptoms.  All other systems are reviewed and are negative.  PHYSICAL EXAM Patient is oriented to person  time and place.  Affect is normal.  There is no jugular venous distention.  Head is atraumatic.  Lungs are clear.  Respiratory effort is nonlabored.  Cardiac exam reveals S1 and S2.  There no clicks or significant murmurs.  The rhythm is regular.  The abdomen is soft.  The patient is overweight.  There is no significant peripheral edema. Filed Vitals:   12/07/10 1050  BP: 133/82  Pulse: 87  Resp: 14  Height: 5\' 9"  (1.753 m)  Weight: 278 lb (126.1 kg)    EKG Is not done today.  ASSESSMENT & PLAN

## 2010-12-07 NOTE — Patient Instructions (Signed)
Increase Vasotec to 10 mg Twice daily   Your physician recommends that you schedule a follow-up appointment in: September with Dr Ron Parker

## 2010-12-07 NOTE — Assessment & Plan Note (Addendum)
Patient is tolerating low-dose carvedilol.  I've chosen not to change the dose today. Her Vasotec dose will be increased to 10 mg twice a day.

## 2011-01-03 ENCOUNTER — Ambulatory Visit (INDEPENDENT_AMBULATORY_CARE_PROVIDER_SITE_OTHER): Payer: Managed Care, Other (non HMO) | Admitting: *Deleted

## 2011-01-03 DIAGNOSIS — Z7901 Long term (current) use of anticoagulants: Secondary | ICD-10-CM

## 2011-01-03 DIAGNOSIS — I4891 Unspecified atrial fibrillation: Secondary | ICD-10-CM

## 2011-01-03 LAB — POCT INR: INR: 2.3

## 2011-01-30 ENCOUNTER — Other Ambulatory Visit: Payer: Self-pay | Admitting: Cardiology

## 2011-02-07 ENCOUNTER — Encounter: Payer: Managed Care, Other (non HMO) | Admitting: *Deleted

## 2011-02-07 ENCOUNTER — Ambulatory Visit: Payer: Managed Care, Other (non HMO) | Admitting: Cardiology

## 2011-02-12 ENCOUNTER — Ambulatory Visit (INDEPENDENT_AMBULATORY_CARE_PROVIDER_SITE_OTHER): Payer: Managed Care, Other (non HMO) | Admitting: *Deleted

## 2011-02-12 DIAGNOSIS — Z7901 Long term (current) use of anticoagulants: Secondary | ICD-10-CM

## 2011-02-12 DIAGNOSIS — I4891 Unspecified atrial fibrillation: Secondary | ICD-10-CM

## 2011-02-12 LAB — POCT INR: INR: 2.6

## 2011-02-25 ENCOUNTER — Ambulatory Visit (INDEPENDENT_AMBULATORY_CARE_PROVIDER_SITE_OTHER): Payer: Managed Care, Other (non HMO) | Admitting: Cardiology

## 2011-02-25 ENCOUNTER — Encounter: Payer: Self-pay | Admitting: Cardiology

## 2011-02-25 DIAGNOSIS — I428 Other cardiomyopathies: Secondary | ICD-10-CM

## 2011-02-25 DIAGNOSIS — Z7901 Long term (current) use of anticoagulants: Secondary | ICD-10-CM

## 2011-02-25 DIAGNOSIS — I059 Rheumatic mitral valve disease, unspecified: Secondary | ICD-10-CM

## 2011-02-25 DIAGNOSIS — I4891 Unspecified atrial fibrillation: Secondary | ICD-10-CM

## 2011-02-25 DIAGNOSIS — I340 Nonrheumatic mitral (valve) insufficiency: Secondary | ICD-10-CM

## 2011-02-25 MED ORDER — CARVEDILOL 12.5 MG PO TABS: 12.5000 mg | ORAL_TABLET | Freq: Two times a day (BID) | ORAL | Status: DC

## 2011-02-25 NOTE — Assessment & Plan Note (Signed)
Coumadin is to be continued.  He will be okay to hold her Coumadin for a period time for cataract surgery if this is necessary

## 2011-02-25 NOTE — Assessment & Plan Note (Signed)
Patient is stable with the continued titration of her medications.  Carvedilol will be increased to 12.5 b.i.d.  I will then see her for the next followup visit for further adjustment.

## 2011-02-25 NOTE — Progress Notes (Signed)
HPI Patient is seen today for followup cardiomyopathy.  I saw her last December 07, 2010.  We have been slowly titrating her medicines and she is tolerating them.  The time of the last visit her ACE inhibitor dose was increased.  It is now time to increase her carvedilol dose.  The patient also plans to have cataract surgery.  She can be cleared for this from a cardiac viewpoint.  He her ophthalmologist continues Coumadin this will be fine.  If not, her Coumadin can be held for a week before the surgery.  It should be Restarted afterwards. Allergies  Allergen Reactions  . Codeine     Current Outpatient Prescriptions  Medication Sig Dispense Refill  . carvedilol (COREG) 6.25 MG tablet Take 1 tablet (6.25 mg total) by mouth 2 (two) times daily.  60 tablet  6  . enalapril (VASOTEC) 10 MG tablet Take 1 tablet (10 mg total) by mouth 2 (two) times daily.  60 tablet  3  . furosemide (LASIX) 40 MG tablet Take 40 mg by mouth daily.        . insulin aspart (NOVOLOG) 100 UNIT/ML injection Inject 18 Units into the skin.       . Liraglutide (VICTOZA) 18 MG/3ML SOLN Inject into the skin daily.       . meclizine (ANTIVERT) 12.5 MG tablet as needed.      . potassium chloride SA (K-DUR,KLOR-CON) 20 MEQ tablet Take 20 mEq by mouth daily.        Marland Kitchen warfarin (COUMADIN) 5 MG tablet TAKE 1 TABLET (5 MG TOTAL) BY MOUTH AS DIRECTED.  50 tablet  3    History   Social History  . Marital Status: Divorced    Spouse Name: N/A    Number of Children: N/A  . Years of Education: N/A   Occupational History  . Not on file.   Social History Main Topics  . Smoking status: Current Everyday Smoker  . Smokeless tobacco: Not on file  . Alcohol Use: No  . Drug Use: No  . Sexually Active: Not on file   Other Topics Concern  . Not on file   Social History Narrative  . No narrative on file    Family History  Problem Relation Age of Onset  . Colon cancer    . Heart disease      Past Medical History  Diagnosis Date    . Hyperkalemia   . Overweight   . CHF (congestive heart failure)   . HLD (hyperlipidemia)   . DM2 (diabetes mellitus, type 2)   . Depression   . History of bronchitis   . History of emphysema   . Atrial fibrillation     Cardioverted to NSR 08/22/2009  . CKD (chronic kidney disease)   . Mitral regurgitation     mild - echo - 1/11 /  Mild, echo, May15, 2012  . Tricuspid regurgitation     moderate - echo - 1/11  . Drug therapy 1/11    coumadin  . Thyroid dysfunction     thyromegally,diffuse,nodule LLL,06/2009  . Ejection fraction < 50%     20-25%, echo.06/2009, /  EF 30-35%, diffuse hypokinesis, echo, Oct 16, 2010  . LFT elevation     06/2009  . Cardiomyopathy     Past Surgical History  Procedure Date  . Cholecystectomy   . Hysterectomy unknow   . Tonsillectomy   . Breast biopsy     ROS  Patient denies fever, chills, headache, sweats,  rash, change in vision, change in hearing, chest pain, cough, nausea vomiting, urinary symptoms.  All other systems are reviewed and are negative.  PHYSICAL EXAM Patient is overweight.  She is stable.  There is no jugular venous distention.  Lungs are clear.  Respiratory effort is unlabored.  Cardiac exam reveals S1-S2.  No clicks or significant murmurs.  The abdomen is soft there is no peripheral edema. Filed Vitals:   02/25/11 1545  BP: 126/74  Pulse: 88  Height: 5\' 9"  (1.753 m)  Weight: 285 lb (129.275 kg)    EKG is not done today  ASSESSMENT & PLAN

## 2011-02-25 NOTE — Assessment & Plan Note (Signed)
Rhythm is regular at this time.  EKG is not done.

## 2011-02-25 NOTE — Assessment & Plan Note (Signed)
Mitral regurgitation is mild.  No further workup at this time.

## 2011-02-25 NOTE — Patient Instructions (Addendum)
Your physician recommends that you schedule a follow-up appointment in: 4 weeks with Dr. Ron Parker. Your physician has recommended you make the following change in your medication: Carvedilol dose increased to 12.5 mg twice a day. You can take the 6.25 mg two tablet twice a day til finish, then start the 12.5 mg one tablet twice a day.

## 2011-03-12 ENCOUNTER — Encounter: Payer: Managed Care, Other (non HMO) | Admitting: *Deleted

## 2011-03-26 ENCOUNTER — Ambulatory Visit: Payer: Managed Care, Other (non HMO) | Admitting: Cardiology

## 2011-03-28 ENCOUNTER — Other Ambulatory Visit: Payer: Self-pay | Admitting: Cardiology

## 2011-03-29 ENCOUNTER — Encounter (HOSPITAL_COMMUNITY)
Admission: RE | Admit: 2011-03-29 | Discharge: 2011-03-29 | Disposition: A | Discharge status: Home / Self Care | Payer: Managed Care, Other (non HMO) | Source: Ambulatory Visit | Attending: Ophthalmology | Admitting: Ophthalmology

## 2011-03-29 ENCOUNTER — Other Ambulatory Visit (HOSPITAL_COMMUNITY): Payer: Self-pay | Admitting: Ophthalmology

## 2011-03-29 DIAGNOSIS — Z01811 Encounter for preprocedural respiratory examination: Secondary | ICD-10-CM

## 2011-03-29 LAB — BASIC METABOLIC PANEL
BUN: 20 mg/dL (ref 6–23)
CO2: 26 mEq/L (ref 19–32)
Calcium: 9.8 mg/dL (ref 8.4–10.5)
Chloride: 102 mEq/L (ref 96–112)
Creatinine, Ser: 1.07 mg/dL (ref 0.50–1.10)
GFR calc Af Amer: 61 mL/min — ABNORMAL LOW (ref >90)
GFR calc non Af Amer: 53 mL/min — ABNORMAL LOW (ref >90)
Glucose, Bld: 113 mg/dL — ABNORMAL HIGH (ref 70–99)
Potassium: 4.4 mEq/L (ref 3.5–5.1)
Sodium: 138 mEq/L (ref 135–145)

## 2011-03-29 LAB — CBC
HCT: 39.3 % (ref 36.0–46.0)
Hemoglobin: 12.8 g/dL (ref 12.0–15.0)
MCH: 26.9 pg (ref 26.0–34.0)
MCHC: 32.6 g/dL (ref 30.0–36.0)
MCV: 82.7 fL (ref 78.0–100.0)
Platelets: 232 10*3/uL (ref 150–400)
RBC: 4.75 MIL/uL (ref 3.87–5.11)
RDW: 15.1 % (ref 11.5–15.5)
WBC: 13 10*3/uL — ABNORMAL HIGH (ref 4.0–10.5)

## 2011-03-29 LAB — PROTIME-INR
INR: 2.14 — ABNORMAL HIGH (ref 0.00–1.49)
Prothrombin Time: 24.3 seconds — ABNORMAL HIGH (ref 11.6–15.2)

## 2011-03-29 LAB — APTT: aPTT: 45 seconds — ABNORMAL HIGH (ref 24–37)

## 2011-04-02 ENCOUNTER — Ambulatory Visit (INDEPENDENT_AMBULATORY_CARE_PROVIDER_SITE_OTHER): Payer: Managed Care, Other (non HMO) | Admitting: *Deleted

## 2011-04-02 DIAGNOSIS — Z7901 Long term (current) use of anticoagulants: Secondary | ICD-10-CM

## 2011-04-02 DIAGNOSIS — I4891 Unspecified atrial fibrillation: Secondary | ICD-10-CM

## 2011-04-02 LAB — POCT INR: INR: 2.4

## 2011-04-03 ENCOUNTER — Ambulatory Visit (HOSPITAL_COMMUNITY)
Admission: RE | Admit: 2011-04-03 | Discharge: 2011-04-03 | Disposition: A | Discharge status: Home / Self Care | Payer: Managed Care, Other (non HMO) | Source: Ambulatory Visit | Attending: Ophthalmology | Admitting: Ophthalmology

## 2011-04-03 DIAGNOSIS — Z01818 Encounter for other preprocedural examination: Secondary | ICD-10-CM | POA: Insufficient documentation

## 2011-04-03 DIAGNOSIS — E119 Type 2 diabetes mellitus without complications: Secondary | ICD-10-CM | POA: Insufficient documentation

## 2011-04-03 DIAGNOSIS — Z01812 Encounter for preprocedural laboratory examination: Secondary | ICD-10-CM | POA: Insufficient documentation

## 2011-04-03 DIAGNOSIS — I10 Essential (primary) hypertension: Secondary | ICD-10-CM | POA: Insufficient documentation

## 2011-04-03 DIAGNOSIS — IMO0002 Reserved for concepts with insufficient information to code with codable children: Secondary | ICD-10-CM | POA: Insufficient documentation

## 2011-04-03 DIAGNOSIS — H269 Unspecified cataract: Secondary | ICD-10-CM | POA: Insufficient documentation

## 2011-04-03 DIAGNOSIS — Y9210 Unspecified residential institution as the place of occurrence of the external cause: Secondary | ICD-10-CM | POA: Insufficient documentation

## 2011-04-03 DIAGNOSIS — E669 Obesity, unspecified: Secondary | ICD-10-CM | POA: Insufficient documentation

## 2011-04-03 LAB — GLUCOSE, CAPILLARY
Glucose-Capillary: 168 mg/dL — ABNORMAL HIGH (ref 70–99)
Glucose-Capillary: 150 mg/dL — ABNORMAL HIGH (ref 70–99)
Glucose-Capillary: 133 mg/dL — ABNORMAL HIGH (ref 70–99)

## 2011-04-04 NOTE — Op Note (Signed)
[name], [name]               ACCOUNT NO.:  [id number]  MEDICAL RECORD NO.:  [id number]  LOCATION:  [facility]                         FACILITY:  [facility]  PHYSICIAN:  Adonis Brook, MD       DATE OF BIRTH:  12-15-1945  DATE OF PROCEDURE:  04/03/2011 DATE OF DISCHARGE:  04/03/2011                              OPERATIVE REPORT   PREOPERATIVE DIAGNOSIS:  Cataract, left eye.  POSTOPERATIVE DIAGNOSIS:  Cataract, left eye.  SURGEON:  Adonis Brook, MD  ESTIMATED BLOOD LOSS:  Less than 1 mL.  There is a complication of posterior capsule tear with cataract fragment dislocated into the vitreous.  SECONDARY PROCEDURE:  Pars plana vitrectomy with removal of lens fragments.  There were no specimens for pathology.  DESCRIPTION:  The patient was prepared and draped in the usual fashion for ocular surgery on the left eye and a solid lid speculum was placed. A peripheral clear corneal incision was made for 3.5-mm centered at the 11 o'clock meridian.  A 15-degree blade was used to enter the anterior chamber at the 2:30 meridian, then a keratome was used to make a self- sealing incision into the anterior chamber through the original corneal groove at 11 o'clock.  Trypan blue was placed in the anterior chamber to stain the anterior capsule because  the patient had a dense 4+ nuclear sclerotic mature cataract with hydropic lens cortex. The cortex was whitened with no red reflex. The trypan blue was immediately rinsed out.   Provisc was instilled into the anterior chamber, and then a bent 25-gauge needle was used to perform a capsulorrhexis. Liquefied cortex and cortex fragments came through the capsulorrhexis into the anterior chamber and they were successfully irrigated with balanced salt solution to clear them from the anterior chamber.  The phacoemulsification handpiece was then inserted and used to gradually remove the nucleus.  The nucleus was quite dense and took considerable time to  remove.  During removal of the last 1/4 of the nucleus, the remaing fragment dropped into the posterior chamber.  A presumptive posterior capsule tear was present, though it could not be visualized up until that point.  The anterior capsule was  intact with  central capsulorrhexis.  I placed the folded AcrySof MA50BM lens into the sulcus.  The trailing haptic was dialed in with the Sinskey lens hook. The anterior chamber was irrigated and the wound closed with an interrupted 10-0 nylon suture.    I converted to pars plana vitrectomy placing a 23g trocar cannula at the 4:30 meridian.  A second trocar was placed at the 2:30 meridian and a conjunctival incision was made permitting the use of a 20-gauge MVR blade at the 9:30 meridian.  The vitreous cutter was used first to remove the vitreous from the instrument path and to remove the core vitreous.  The posterior vitreous face was allowed to remain on the retina to act as a cushion against the nuclear lens fragment, which was lying on the posterior retinal surface. The fragmentation handpiece was then inserted and the nucleus fragment was removed uneventfully.    The vitreous cutter was then used to remove some  smaller lens fragments.  The posterior vitreous  face was then engaged with the vitreous cutter on the aspiration mode and separated from the posterior retinal surface and then the vitreous cutter was used to remove the remaining vitreous.  After completion of vitrectomy, the sclerotomy site at 9:30 was closed with 7- 0 Vicryl suture and the conjunctiva was reapproximated with 6-0 plain gut suture.  The cannula at 2:30 was removed with concomitant closure using a cotton-tip applicator and the infusion pressure turned down to 10 mmHg.  The infusion line was then removed in similar fashion with concomitant closure with a cotton-tip applicator.  The patient was given 100 mg of Ancef subconjunctivally and then 4 mg of  Decadron subconjunctivally.  The lid speculum was removed.  The patient's eye was patched with Polymyxin/Bacitracin ophthalmic ointment and a plastic shield was placed, and she was transferred alert and conversant from the operating room to the postoperative recovery area.          ______________________________ Adonis Brook, MD     GG/MEDQ  D:  04/03/2011  T:  04/04/2011  Job:  [id number]  Electronically Signed by Adonis Brook MD on 04/04/2011 10:57:47 PM

## 2011-04-30 ENCOUNTER — Encounter: Payer: Managed Care, Other (non HMO) | Admitting: *Deleted

## 2011-05-25 ENCOUNTER — Other Ambulatory Visit: Payer: Self-pay | Admitting: Cardiology

## 2011-06-20 ENCOUNTER — Other Ambulatory Visit: Payer: Self-pay | Admitting: *Deleted

## 2011-06-20 MED ORDER — CARVEDILOL 12.5 MG PO TABS: 12.5000 mg | ORAL_TABLET | Freq: Two times a day (BID) | ORAL | Status: DC

## 2011-06-26 ENCOUNTER — Ambulatory Visit (INDEPENDENT_AMBULATORY_CARE_PROVIDER_SITE_OTHER): Payer: Managed Care, Other (non HMO) | Admitting: *Deleted

## 2011-06-26 DIAGNOSIS — Z7901 Long term (current) use of anticoagulants: Secondary | ICD-10-CM

## 2011-06-26 DIAGNOSIS — I4891 Unspecified atrial fibrillation: Secondary | ICD-10-CM

## 2011-06-26 LAB — POCT INR: INR: 2

## 2011-07-29 ENCOUNTER — Other Ambulatory Visit: Payer: Self-pay | Admitting: *Deleted

## 2011-07-29 MED ORDER — ENALAPRIL MALEATE 10 MG PO TABS: 10.0000 mg | ORAL_TABLET | Freq: Two times a day (BID) | ORAL | Status: DC

## 2011-08-08 ENCOUNTER — Ambulatory Visit: Payer: Medicare Other | Admitting: Cardiology

## 2011-08-17 ENCOUNTER — Inpatient Hospital Stay (HOSPITAL_COMMUNITY)
Admission: EM | Admit: 2011-08-17 | Discharge: 2011-08-24 | DRG: 699 | Disposition: A | Discharge status: Home / Self Care | Payer: Medicare Other | Attending: Internal Medicine | Admitting: Internal Medicine

## 2011-08-17 ENCOUNTER — Emergency Department (HOSPITAL_COMMUNITY): Payer: Medicare Other

## 2011-08-17 ENCOUNTER — Encounter (HOSPITAL_COMMUNITY): Payer: Self-pay | Admitting: *Deleted

## 2011-08-17 ENCOUNTER — Other Ambulatory Visit: Payer: Self-pay

## 2011-08-17 DIAGNOSIS — I4891 Unspecified atrial fibrillation: Secondary | ICD-10-CM | POA: Diagnosis present

## 2011-08-17 DIAGNOSIS — N179 Acute kidney failure, unspecified: Secondary | ICD-10-CM | POA: Diagnosis present

## 2011-08-17 DIAGNOSIS — N183 Chronic kidney disease, stage 3 unspecified: Secondary | ICD-10-CM | POA: Diagnosis present

## 2011-08-17 DIAGNOSIS — N289 Disorder of kidney and ureter, unspecified: Secondary | ICD-10-CM

## 2011-08-17 DIAGNOSIS — IMO0002 Reserved for concepts with insufficient information to code with codable children: Secondary | ICD-10-CM | POA: Diagnosis present

## 2011-08-17 DIAGNOSIS — M109 Gout, unspecified: Secondary | ICD-10-CM | POA: Diagnosis present

## 2011-08-17 DIAGNOSIS — L0292 Furuncle, unspecified: Secondary | ICD-10-CM | POA: Diagnosis present

## 2011-08-17 DIAGNOSIS — I428 Other cardiomyopathies: Secondary | ICD-10-CM | POA: Diagnosis present

## 2011-08-17 DIAGNOSIS — R21 Rash and other nonspecific skin eruption: Secondary | ICD-10-CM | POA: Diagnosis not present

## 2011-08-17 DIAGNOSIS — E1129 Type 2 diabetes mellitus with other diabetic kidney complication: Secondary | ICD-10-CM

## 2011-08-17 DIAGNOSIS — F17200 Nicotine dependence, unspecified, uncomplicated: Secondary | ICD-10-CM | POA: Diagnosis present

## 2011-08-17 DIAGNOSIS — E785 Hyperlipidemia, unspecified: Secondary | ICD-10-CM | POA: Diagnosis present

## 2011-08-17 DIAGNOSIS — L0293 Carbuncle, unspecified: Secondary | ICD-10-CM | POA: Diagnosis present

## 2011-08-17 DIAGNOSIS — E860 Dehydration: Secondary | ICD-10-CM | POA: Diagnosis present

## 2011-08-17 DIAGNOSIS — N189 Chronic kidney disease, unspecified: Secondary | ICD-10-CM | POA: Diagnosis present

## 2011-08-17 DIAGNOSIS — M25539 Pain in unspecified wrist: Secondary | ICD-10-CM | POA: Diagnosis present

## 2011-08-17 DIAGNOSIS — B354 Tinea corporis: Secondary | ICD-10-CM | POA: Diagnosis present

## 2011-08-17 DIAGNOSIS — R531 Weakness: Secondary | ICD-10-CM

## 2011-08-17 DIAGNOSIS — E1165 Type 2 diabetes mellitus with hyperglycemia: Secondary | ICD-10-CM

## 2011-08-17 DIAGNOSIS — L0291 Cutaneous abscess, unspecified: Secondary | ICD-10-CM

## 2011-08-17 LAB — URINALYSIS, ROUTINE W REFLEX MICROSCOPIC
Bilirubin Urine: NEGATIVE
Glucose, UA: >1000 mg/dL — AB
Hgb urine dipstick: NEGATIVE
Leukocytes, UA: NEGATIVE
Nitrite: NEGATIVE
Protein, ur: NEGATIVE mg/dL
Specific Gravity, Urine: 1.034 — ABNORMAL HIGH (ref 1.005–1.030)
Urobilinogen, UA: 0.2 mg/dL (ref 0.0–1.0)
pH: 5.5 (ref 5.0–8.0)

## 2011-08-17 LAB — CBC
HCT: 35.5 % — ABNORMAL LOW (ref 36.0–46.0)
Hemoglobin: 11.6 g/dL — ABNORMAL LOW (ref 12.0–15.0)
MCH: 26.1 pg (ref 26.0–34.0)
MCHC: 32.7 g/dL (ref 30.0–36.0)
MCV: 80 fL (ref 78.0–100.0)
Platelets: 245 10*3/uL (ref 150–400)
RBC: 4.44 MIL/uL (ref 3.87–5.11)
RDW: 13.8 % (ref 11.5–15.5)
WBC: 17 10*3/uL — ABNORMAL HIGH (ref 4.0–10.5)

## 2011-08-17 LAB — GLUCOSE, CAPILLARY
Glucose-Capillary: 526 mg/dL — ABNORMAL HIGH (ref 70–99)
Glucose-Capillary: 479 mg/dL — ABNORMAL HIGH (ref 70–99)
Glucose-Capillary: 247 mg/dL — ABNORMAL HIGH (ref 70–99)
Glucose-Capillary: 264 mg/dL — ABNORMAL HIGH (ref 70–99)

## 2011-08-17 LAB — URINE MICROSCOPIC-ADD ON

## 2011-08-17 LAB — BASIC METABOLIC PANEL
BUN: 16 mg/dL (ref 6–23)
CO2: 28 mEq/L (ref 19–32)
Calcium: 9.4 mg/dL (ref 8.4–10.5)
Chloride: 92 mEq/L — ABNORMAL LOW (ref 96–112)
Creatinine, Ser: 1.66 mg/dL — ABNORMAL HIGH (ref 0.50–1.10)
GFR calc Af Amer: 36 mL/min — ABNORMAL LOW (ref >90)
GFR calc non Af Amer: 31 mL/min — ABNORMAL LOW (ref >90)
Glucose, Bld: 515 mg/dL — ABNORMAL HIGH (ref 70–99)
Potassium: 5 mEq/L (ref 3.5–5.1)
Sodium: 129 mEq/L — ABNORMAL LOW (ref 135–145)

## 2011-08-17 LAB — BLOOD GAS, VENOUS
Acid-Base Excess: 1.7 mmol/L (ref 0.0–2.0)
Bicarbonate: 26.8 mEq/L — ABNORMAL HIGH (ref 20.0–24.0)
O2 Saturation: 30.4 %
Patient temperature: 37
TCO2: 24.9 mmol/L (ref 0–100)
pCO2, Ven: 46.5 mmHg (ref 45.0–50.0)
pH, Ven: 7.379 — ABNORMAL HIGH (ref 7.250–7.300)
pO2, Ven: 19.4 mmHg — CL (ref 30.0–45.0)

## 2011-08-17 LAB — PROTIME-INR
INR: 2.43 — ABNORMAL HIGH (ref 0.00–1.49)
Prothrombin Time: 26.8 seconds — ABNORMAL HIGH (ref 11.6–15.2)

## 2011-08-17 LAB — MAGNESIUM: Magnesium: 1.8 mg/dL (ref 1.5–2.5)

## 2011-08-17 LAB — TROPONIN I: Troponin I: <0.3 ng/mL (ref <0.30)

## 2011-08-17 MED ORDER — SODIUM CHLORIDE 0.9 % IV BOLUS (SEPSIS)
1000.0000 mL | Freq: Once | INTRAVENOUS | Status: AC
  Administered 2011-08-17: 1000 mL via INTRAVENOUS

## 2011-08-17 MED ORDER — WARFARIN SODIUM 5 MG PO TABS
5.0000 mg | ORAL_TABLET | Freq: Once | ORAL | Status: DC
  Filled 2011-08-17: qty 1

## 2011-08-17 MED ORDER — INSULIN ASPART 100 UNIT/ML ~~LOC~~ SOLN
0.0000 [IU] | Freq: Every day | SUBCUTANEOUS | Status: DC
  Administered 2011-08-17: 3 [IU] via SUBCUTANEOUS
  Administered 2011-08-18 – 2011-08-23 (×2): 2 [IU] via SUBCUTANEOUS

## 2011-08-17 MED ORDER — PIPERACILLIN-TAZOBACTAM 3.375 G IVPB
3.3750 g | Freq: Three times a day (TID) | INTRAVENOUS | Status: DC
  Administered 2011-08-17 – 2011-08-24 (×20): 3.375 g via INTRAVENOUS
  Filled 2011-08-17 (×24): qty 50

## 2011-08-17 MED ORDER — ONDANSETRON HCL 4 MG PO TABS: 4.0000 mg | ORAL_TABLET | Freq: Four times a day (QID) | ORAL | Status: DC | PRN

## 2011-08-17 MED ORDER — WARFARIN - PHARMACIST DOSING INPATIENT
Freq: Every day | Status: DC
  Administered 2011-08-18: 18:00:00

## 2011-08-17 MED ORDER — LIDOCAINE-EPINEPHRINE 2 %-1:100000 IJ SOLN
20.0000 mL | Freq: Once | INTRAMUSCULAR | Status: AC
  Administered 2011-08-17: 20 mL

## 2011-08-17 MED ORDER — ONDANSETRON HCL 4 MG/2ML IJ SOLN
4.0000 mg | Freq: Once | INTRAMUSCULAR | Status: AC
  Administered 2011-08-17: 4 mg via INTRAVENOUS
  Filled 2011-08-17: qty 2

## 2011-08-17 MED ORDER — VANCOMYCIN HCL IN DEXTROSE 1-5 GM/200ML-% IV SOLN
1000.0000 mg | Freq: Once | INTRAVENOUS | Status: AC
  Administered 2011-08-17: 1000 mg via INTRAVENOUS
  Filled 2011-08-17: qty 200

## 2011-08-17 MED ORDER — SODIUM CHLORIDE 0.9 % IV SOLN
1750.0000 mg | INTRAVENOUS | Status: DC
  Administered 2011-08-18 – 2011-08-21 (×4): 1750 mg via INTRAVENOUS
  Filled 2011-08-17 (×4): qty 1750

## 2011-08-17 MED ORDER — INSULIN ASPART 100 UNIT/ML ~~LOC~~ SOLN
0.0000 [IU] | Freq: Three times a day (TID) | SUBCUTANEOUS | Status: DC
  Administered 2011-08-17 – 2011-08-18 (×2): 11 [IU] via SUBCUTANEOUS
  Administered 2011-08-18: 7 [IU] via SUBCUTANEOUS
  Administered 2011-08-18: 4 [IU] via SUBCUTANEOUS
  Administered 2011-08-19: 3 [IU] via SUBCUTANEOUS
  Administered 2011-08-19 (×2): 7 [IU] via SUBCUTANEOUS
  Administered 2011-08-20: 3 [IU] via SUBCUTANEOUS
  Administered 2011-08-20: 4 [IU] via SUBCUTANEOUS
  Administered 2011-08-20: 3 [IU] via SUBCUTANEOUS
  Administered 2011-08-21: 4 [IU] via SUBCUTANEOUS
  Administered 2011-08-21: 7 [IU] via SUBCUTANEOUS
  Administered 2011-08-21 – 2011-08-22 (×2): 3 [IU] via SUBCUTANEOUS
  Administered 2011-08-23 (×2): 4 [IU] via SUBCUTANEOUS
  Administered 2011-08-23: 7 [IU] via SUBCUTANEOUS

## 2011-08-17 MED ORDER — SODIUM CHLORIDE 0.9 % IV SOLN: INTRAVENOUS | Status: DC

## 2011-08-17 MED ORDER — ACETAMINOPHEN 325 MG PO TABS
650.0000 mg | ORAL_TABLET | Freq: Four times a day (QID) | ORAL | Status: DC | PRN
  Administered 2011-08-18 – 2011-08-19 (×2): 650 mg via ORAL
  Filled 2011-08-17 (×2): qty 2

## 2011-08-17 MED ORDER — ACETAMINOPHEN 650 MG RE SUPP: 650.0000 mg | Freq: Four times a day (QID) | RECTAL | Status: DC | PRN

## 2011-08-17 MED ORDER — ADULT MULTIVITAMIN W/MINERALS CH
1.0000 | ORAL_TABLET | Freq: Every day | ORAL | Status: DC
Start: 2011-08-17 — End: 2011-08-24
  Administered 2011-08-17 – 2011-08-24 (×8): 1 via ORAL
  Filled 2011-08-17 (×8): qty 1

## 2011-08-17 MED ORDER — ONDANSETRON HCL 4 MG/2ML IJ SOLN
4.0000 mg | Freq: Four times a day (QID) | INTRAMUSCULAR | Status: DC | PRN
  Administered 2011-08-18: 4 mg via INTRAVENOUS
  Filled 2011-08-17: qty 2

## 2011-08-17 MED ORDER — INSULIN GLARGINE 100 UNIT/ML ~~LOC~~ SOLN
20.0000 [IU] | Freq: Every day | SUBCUTANEOUS | Status: DC
  Administered 2011-08-17: 20 [IU] via SUBCUTANEOUS

## 2011-08-17 MED ORDER — HYDROMORPHONE HCL PF 1 MG/ML IJ SOLN
0.5000 mg | INTRAMUSCULAR | Status: DC | PRN
  Administered 2011-08-19 – 2011-08-22 (×7): 0.5 mg via INTRAVENOUS
  Filled 2011-08-17 (×7): qty 1

## 2011-08-17 MED ORDER — ONDANSETRON HCL 4 MG/2ML IJ SOLN: 4.0000 mg | Freq: Three times a day (TID) | INTRAMUSCULAR | Status: DC | PRN

## 2011-08-17 MED ORDER — HYDROMORPHONE HCL PF 1 MG/ML IJ SOLN
0.5000 mg | Freq: Once | INTRAMUSCULAR | Status: AC
  Administered 2011-08-17: 0.5 mg via INTRAVENOUS
  Filled 2011-08-17: qty 1

## 2011-08-17 MED ORDER — PANTOPRAZOLE SODIUM 40 MG PO TBEC
40.0000 mg | DELAYED_RELEASE_TABLET | Freq: Every day | ORAL | Status: DC
  Administered 2011-08-17 – 2011-08-24 (×8): 40 mg via ORAL
  Filled 2011-08-17 (×8): qty 1

## 2011-08-17 MED ORDER — INSULIN ASPART 100 UNIT/ML ~~LOC~~ SOLN
15.0000 [IU] | Freq: Once | SUBCUTANEOUS | Status: AC
  Administered 2011-08-17: 15 [IU] via INTRAVENOUS
  Filled 2011-08-17: qty 1

## 2011-08-17 NOTE — Progress Notes (Signed)
ANTICOAGULATION CONSULT NOTE - Initial Consult  Pharmacy Consult for Coumadin Indication: atrial fibrillation  Allergies  Allergen Reactions  . Codeine Hives and Nausea And Vomiting   Patient Measurements: Height: 5\' 9"  (175.3 cm) Weight: 267 lb 6.7 oz (121.3 kg) IBW/kg (Calculated) : 66.2   Vital Signs: Temp: 98.6 F (37 C) (03/16 1900) Temp src: Oral (03/16 1900) BP: 146/77 mmHg (03/16 1900) Pulse Rate: 88  (03/16 1900)  Labs:  Basename 08/17/11 1216 08/17/11 1200  HGB -- 11.6*  HCT -- 35.5*  PLT -- 245  APTT -- --  LABPROT 26.8* --  INR 2.43* --  HEPARINUNFRC -- --  CREATININE -- 1.66*  CKTOTAL -- --  CKMB -- --  TROPONINI -- <0.30   Estimated Creatinine Clearance: 46.4 ml/min (by C-G formula based on Cr of 1.66).  Medical History: Past Medical History  Diagnosis Date  . Hyperkalemia   . Overweight   . CHF (congestive heart failure)   . HLD (hyperlipidemia)   . DM2 (diabetes mellitus, type 2)   . Depression   . History of bronchitis   . History of emphysema   . Atrial fibrillation     Cardioverted to NSR 08/22/2009  . CKD (chronic kidney disease)   . Mitral regurgitation     mild - echo - 1/11 /  Mild, echo, May15, 2012  . Tricuspid regurgitation     moderate - echo - 1/11  . Drug therapy 1/11    coumadin  . Thyroid dysfunction     thyromegally,diffuse,nodule LLL,06/2009  . Ejection fraction < 50%     20-25%, echo.06/2009, /  EF 30-35%, diffuse hypokinesis, echo, Oct 16, 2010  . LFT elevation     06/2009  . Cardiomyopathy     Medications:  Prescriptions prior to admission  Medication Sig Dispense Refill  . carvedilol (COREG) 12.5 MG tablet Take 1 tablet (12.5 mg total) by mouth 2 (two) times daily.  60 tablet  6  . enalapril (VASOTEC) 10 MG tablet Take 1 tablet (10 mg total) by mouth 2 (two) times daily.  60 tablet  3  . furosemide (LASIX) 40 MG tablet Take 40 mg by mouth daily as needed. Fluid retention      . insulin aspart (NOVOLOG) 100  UNIT/ML injection Inject 15 Units into the skin at bedtime.       . lansoprazole (PREVACID) 15 MG capsule Take 15 mg by mouth daily.      . Liraglutide (VICTOZA) 18 MG/3ML SOLN Inject into the skin daily.       . Multiple Vitamin (MULITIVITAMIN WITH MINERALS) TABS Take 1 tablet by mouth daily.      . potassium chloride SA (K-DUR,KLOR-CON) 20 MEQ tablet Take 20 mEq by mouth daily.        Marland Kitchen warfarin (COUMADIN) 5 MG tablet Take 5-7.5 mg by mouth See admin instructions. Takes 1 tablet on fridays and Monday for 5mg  dosage....takes1.5 tablet every other day for 7.5mg  dosage        Assessment:  65yo F on chronic Coumadin for A.fib.  INR therapeutic on admission. Last dose taken 3/15.  Broad-spectrum abx could increase sensitivity.  Goal of Therapy:  INR 2-3   Plan:   Coumadin 5mg  tonight.  Daily INR.  [name] 08/17/2011,7:32 PM

## 2011-08-17 NOTE — ED Notes (Signed)
Pt stated that she cannot urinate at this time and wants to wait to get more fluids

## 2011-08-17 NOTE — ED Notes (Signed)
CBG 526

## 2011-08-17 NOTE — ED Provider Notes (Addendum)
History    66 year old female referred by her primary care Dr. for evaluation of hypotension. Patient states that she is generally not been feeling well for about the past 3 weeks. Symptom onset was with viral URI type symptoms which resolved but since then patient has been progressively more and more fatigued. She's had difficulty controlling her blood sugars for the past week with multiple readings the 500s despite a decreased appetite. No fevers or chills. Polyuria otherwise no urinary complaints. Nausea and vomiting for the past 2 days. No diarrhea. Patient reports compliance with her hypoglycemic meds reports no recent meds changes.   CSN: [id number]  Arrival date & time 08/17/11  1142   First MD Initiated Contact with Patient 08/17/11 1148      Chief Complaint  Patient presents with  . Hypotension  . Hyperglycemia    (Consider location/radiation/quality/duration/timing/severity/associated sxs/prior treatment) HPI  Past Medical History  Diagnosis Date  . Hyperkalemia   . Overweight   . CHF (congestive heart failure)   . HLD (hyperlipidemia)   . DM2 (diabetes mellitus, type 2)   . Depression   . History of bronchitis   . History of emphysema   . Atrial fibrillation     Cardioverted to NSR 08/22/2009  . CKD (chronic kidney disease)   . Mitral regurgitation     mild - echo - 1/11 /  Mild, echo, May15, 2012  . Tricuspid regurgitation     moderate - echo - 1/11  . Drug therapy 1/11    coumadin  . Thyroid dysfunction     thyromegally,diffuse,nodule LLL,06/2009  . Ejection fraction < 50%     20-25%, echo.06/2009, /  EF 30-35%, diffuse hypokinesis, echo, Oct 16, 2010  . LFT elevation     06/2009  . Cardiomyopathy     Past Surgical History  Procedure Date  . Cholecystectomy   . Hysterectomy unknow   . Tonsillectomy   . Breast biopsy     Family History  Problem Relation Age of Onset  . Colon cancer    . Heart disease      History  Substance Use Topics  . Smoking  status: Current Everyday Smoker  . Smokeless tobacco: Not on file  . Alcohol Use: No    OB History    Grav Para Term Preterm Abortions TAB SAB Ect Mult Living                  Review of Systems   Review of symptoms negative unless otherwise noted in HPI.   Allergies  Codeine  Home Medications   Current Outpatient Rx  Name Route Sig Dispense Refill  . CARVEDILOL 12.5 MG PO TABS Oral Take 1 tablet (12.5 mg total) by mouth 2 (two) times daily. 60 tablet 6  . ENALAPRIL MALEATE 10 MG PO TABS Oral Take 1 tablet (10 mg total) by mouth 2 (two) times daily. 60 tablet 3  . FUROSEMIDE 40 MG PO TABS Oral Take 40 mg by mouth daily.      . INSULIN ASPART 100 UNIT/ML [place] SOLN Subcutaneous Inject 18 Units into the skin.     Marland Kitchen LIRAGLUTIDE 18 MG/3ML [place] SOLN Subcutaneous Inject into the skin daily.     . MECLIZINE HCL 12.5 MG PO TABS  as needed.    Marland Kitchen POTASSIUM CHLORIDE CRYS ER 20 MEQ PO TBCR Oral Take 20 mEq by mouth daily.      . WARFARIN SODIUM 5 MG PO TABS  TAKE 1 TABLET (5 MG  TOTAL) BY MOUTH AS DIRECTED. 50 tablet 3    BP 123/70  Pulse 90  Temp(Src) 99.1 F (37.3 C) (Oral)  Resp 20  SpO2 98%  Physical Exam  Nursing note and vitals reviewed. Constitutional: She is oriented to person, place, and time. She appears well-developed. No distress.       Laying in bed. Tired appearing but not toxic. Obese.  HENT:  Head: Normocephalic and atraumatic.  Eyes: Conjunctivae are normal. Pupils are equal, round, and reactive to light. Right eye exhibits no discharge. Left eye exhibits no discharge.  Neck: Neck supple.  Cardiovascular: Regular rhythm and normal heart sounds.  Exam reveals no gallop and no friction rub.   No murmur heard.      Mildly Tachycardic with a regular rhythm.  Pulmonary/Chest: Effort normal and breath sounds normal. No respiratory distress.  Abdominal: Soft. She exhibits no distension. There is tenderness.       Very mild tenderness in epigastrium without guarding or  rebound tenderness. No mass appreciated.  Musculoskeletal: She exhibits no edema and no tenderness.  Neurological: She is alert and oriented to person, place, and time. No cranial nerve deficit. She exhibits normal muscle tone. Coordination normal.  Skin: Skin is warm and dry.       2 small abscesses to R buttock with small amount of purulent drainage, ~1cm and 2cm respectively. Fluctuant, tender. No surrounding cellulitis. L breast with what appears to be superficial ulcerated lesion with small amount of purulence. Located inferior aspect of breast. No significant induration. Does not appear to be tracking deeper.  Psychiatric: She has a normal mood and affect. Her behavior is normal. Thought content normal.    ED Course  Procedures (including critical care time)  INCISION AND DRAINAGE Performed by: Virgel Manifold Consent: Verbal consent obtained. Risks and benefits: risks, benefits and alternatives were discussed Type: abscess  Body area: R buttock  Anesthesia: local infiltration  Local anesthetic: lidocaine 2% wepinephrine  Anesthetic total: 2 ml  Complexity: complex Blunt dissection to break up loculations  Drainage: purulent  Drainage amount: moderate  Packing material: 1/4 in iodoform gauze  Patient tolerance: Patient tolerated the procedure well with no immediate complications.  INCISION AND DRAINAGE Performed by: Virgel Manifold Consent: Verbal consent obtained. Risks and benefits: risks, benefits and alternatives were discussed Type: abscess  Body area: 2nd abscess R buttock  Anesthesia: local infiltration  Local anesthetic: lidocaine 2% w/ epinephrine  Anesthetic total: 2 ml  Complexity: complex Blunt dissection to break up loculations  Drainage: purulent  Drainage amount: small  Packing material: 1/4 in iodoform gauze  Patient tolerance: Patient tolerated the procedure well with no immediate complications.      Labs Reviewed  BASIC METABOLIC  PANEL - Abnormal; Notable for the following:    Sodium 129 (*)    Chloride 92 (*)    Glucose, Bld 515 (*)    Creatinine, Ser 1.66 (*)    GFR calc non Af Amer 31 (*)    GFR calc Af Amer 36 (*)    All other components within normal limits  CBC - Abnormal; Notable for the following:    WBC 17.0 (*)    Hemoglobin 11.6 (*)    HCT 35.5 (*)    All other components within normal limits  BLOOD GAS, VENOUS - Abnormal; Notable for the following:    pH, Ven 7.379 (*)    pO2, Ven 19.4 (*)    Bicarbonate 26.8 (*)    All other components  within normal limits  PROTIME-INR - Abnormal; Notable for the following:    Prothrombin Time 26.8 (*)    INR 2.43 (*)    All other components within normal limits  GLUCOSE, CAPILLARY - Abnormal; Notable for the following:    Glucose-Capillary 526 (*)    All other components within normal limits  GLUCOSE, CAPILLARY - Abnormal; Notable for the following:    Glucose-Capillary 479 (*)    All other components within normal limits  MAGNESIUM  TROPONIN I  URINALYSIS, ROUTINE W REFLEX MICROSCOPIC   Dg Chest 2 View  08/17/2011  *RADIOLOGY REPORT*  Clinical Data: Hyperglycemia, hypertension  CHEST - 2 VIEW  Comparison: 03/29/2011  Findings: Cardiomediastinal silhouette is stable.  No acute infiltrate or pleural effusion.  No pulmonary edema.  Bony thorax is stable.  IMPRESSION: . No active disease.  Original Report Authenticated By: Lahoma Crocker, M.D.     1. Uncontrolled diabetes mellitus   2. Renal insufficiency   3. Dehydration   4. Generalized weakness   5. Abscess    EKG:  Rhythm: NSR Rate: 90 Axis:normal Intervals: poor r wave progression.  ST segments: NS ST changes. t wave flattening inferiorly and laterally. Little change from previous from 08/22/09  5:10 PM When went to discuss with patient plan admission she now discloses that she has abscesses to her left breast and the right buttock. The lesion to her left breast is spontaneously draining. Patient  has 2 abscesses to her buttock which require incision and drainage though. May partially reason for elevated blood sugars. Dose abx ordered. Will I&D.   MDM  65yF with hyperglycemia, orthostatic hypotension and general malaise. Hyperglycemic but no laboratory evidence of DKA.  Improved with IVF and insulin. Renal insufficiency. Per review of records appears to have baseline mild renal dysfunction. Cr today elevated from previous though. Multiple abscesses. Once on abx I&D. Dose of abx given. Discussed with hospitalist for admission.        Virgel Manifold, MD 08/17/11 Cecille Amsterdam  Virgel Manifold, MD 08/17/11 [id number]

## 2011-08-17 NOTE — H&P (Signed)
PCP:   Maximino Greenland, MD, MD  Primary Cardiologist: Dr Dola Argyle.  Chief Complaint:  Generalized weakness, dizziness, elevated blood glucose.  HPI: This is a 66 year old female, with known history of Atrial fibrillation, s/p remote RFA ablation, s/p DCCV 08/24/11, chronic anticoagulation, Insulin requiring DM-2, HTN, dyslipidemia, CHF 06/1999, EF 20%-25% per 2 D Echo of 06/27/09, COPD, depression, GERD, obesity, CKD, baseline creat 1.07-1.42 06/2009, left thyroid nodule, s/p left eye cataract surgery 06/13/10, s/p tonsillectomy, s/p hysterectomy, s/p cholecystectomy, s/p right breast biopsy,presenting to the ED via referral from her PMD's office. According to patient, she had been feeling unwell for about 3 weeks, with brief URI symptoms, which have since subsided, but after which she became progressively weak. Her home CBGs, have been quite high, in the past week, despite compliance with her medications. She was seen at her PMD's office today on a scheduled visit, and , was found to be orthostatic, with BP of 50/30, per patient, CBGs were in the 500s, and she was sent to ED, via EMS. According to patient, she has had chills and temperature up to 101-102, in the past couple of weeks.  Allergies:   Allergies  Allergen Reactions  . Codeine Hives and Nausea And Vomiting      Past Medical History  Diagnosis Date  . Hyperkalemia   . Overweight   . CHF (congestive heart failure)   . HLD (hyperlipidemia)   . DM2 (diabetes mellitus, type 2)   . Depression   . History of bronchitis   . History of emphysema   . Atrial fibrillation     Cardioverted to NSR 08/22/2009  . CKD (chronic kidney disease)   . Mitral regurgitation     mild - echo - 1/11 /  Mild, echo, May15, 2012  . Tricuspid regurgitation     moderate - echo - 1/11  . Drug therapy 1/11    coumadin  . Thyroid dysfunction     thyromegally,diffuse,nodule LLL,06/2009  . Ejection fraction < 50%     20-25%, echo.06/2009, /  EF 30-35%,  diffuse hypokinesis, echo, Oct 16, 2010  . LFT elevation     06/2009  . Cardiomyopathy     Past Surgical History  Procedure Date  . Cholecystectomy   . Hysterectomy unknow   . Tonsillectomy   . Breast biopsy     Prior to Admission medications   Medication Sig Start Date End Date Taking? Authorizing Provider  carvedilol (COREG) 12.5 MG tablet Take 1 tablet (12.5 mg total) by mouth 2 (two) times daily. 06/20/11 06/19/12 Yes Carlena Bjornstad, MD  enalapril (VASOTEC) 10 MG tablet Take 1 tablet (10 mg total) by mouth 2 (two) times daily. 07/29/11  Yes Carlena Bjornstad, MD  furosemide (LASIX) 40 MG tablet Take 40 mg by mouth daily as needed. Fluid retention   Yes Historical Provider, MD  insulin aspart (NOVOLOG) 100 UNIT/ML injection Inject 15 Units into the skin at bedtime.    Yes Historical Provider, MD  lansoprazole (PREVACID) 15 MG capsule Take 15 mg by mouth daily.   Yes Historical Provider, MD  Liraglutide (VICTOZA) 18 MG/3ML SOLN Inject into the skin daily.    Yes Historical Provider, MD  Multiple Vitamin (MULITIVITAMIN WITH MINERALS) TABS Take 1 tablet by mouth daily.   Yes Historical Provider, MD  potassium chloride SA (K-DUR,KLOR-CON) 20 MEQ tablet Take 20 mEq by mouth daily.     Yes Historical Provider, MD  warfarin (COUMADIN) 5 MG tablet Take 5-7.5 mg  by mouth See admin instructions. Takes 1 tablet on fridays and Monday for 5mg  dosage....takes1.5 tablet every other day for 7.5mg  dosage   Yes Historical Provider, MD    Social History: Patient has been divorced for 38 years. She is a retired [employer] at [employer].  She continues to smoke about a half pack of cigarettes per day, until 3 weeks ago, when she cut down to 3 cigarettes per day. She does not have any smokeless tobacco history on file. She reports that she does not drink alcohol or use illicit drugs.  Family History  Problem Relation Age of Onset  . Colon cancer    . Heart disease      Review of Systems:  As  per HPI and chief complaint. Patent denies headache, blurred vision, difficulty in speaking, dysphagia, chest pain, cough, shortness of breath, orthopnea, paroxysmal nocturnal dyspnea, nausea, diaphoresis, abdominal pain, vomiting, diarrhea, belching, heartburn, hematemesis, melena, hematochezia, lower extremity swelling, pain, or redness. The rest of the systems review is negative.  Physical Exam: General:  Patient does not appear to be in obvious acute distress at this time, alert, communicative, fully oriented, talking in complete sentences, not short of breath at rest.  HEENT:  No clinical pallor, no jaundice, no conjunctival injection or discharge. Visible buccal mucosa is "dry".  NECK:  Supple, JVP not seen, no carotid bruits, no palpable lymphadenopathy, no palpable goiter. CHEST:  Clinically clear to auscultation, no wheezes, no crackles. HEART:  Sounds 1 and 2 heard, normal, regular, no murmurs. ABDOMEN:  Obese, soft, non-tender, no palpable organomegaly, no palpable masses, normal bowel sounds. GENITALIA:  Not examined.. LOWER EXTREMITIES:  No pitting edema, palpable peripheral pulses. MUSCULOSKELETAL SYSTEM:  Generalized osteoarthritic changes, otherwise, normal. CENTRAL NERVOUS SYSTEM:  No focal neurologic deficit on gross examination.  Labs on Admission:  Results for orders placed during the hospital encounter of 08/17/11 (from the past 48 hour(s))  BASIC METABOLIC PANEL     Status: Abnormal   Collection Time   08/17/11 12:00 PM      Component Value Range Comment   Sodium 129 (*) 135 - 145 (mEq/L)    Potassium 5.0  3.5 - 5.1 (mEq/L)    Chloride 92 (*) 96 - 112 (mEq/L)    CO2 28  19 - 32 (mEq/L)    Glucose, Bld 515 (*) 70 - 99 (mg/dL)    BUN 16  6 - 23 (mg/dL)    Creatinine, Ser 1.66 (*) 0.50 - 1.10 (mg/dL)    Calcium 9.4  8.4 - 10.5 (mg/dL)    GFR calc non Af Amer 31 (*) >90 (mL/min)    GFR calc Af Amer 36 (*) >90 (mL/min)   MAGNESIUM     Status: Normal   Collection Time    08/17/11 12:00 PM      Component Value Range Comment   Magnesium 1.8  1.5 - 2.5 (mg/dL)   CBC     Status: Abnormal   Collection Time   08/17/11 12:00 PM      Component Value Range Comment   WBC 17.0 (*) 4.0 - 10.5 (K/uL)    RBC 4.44  3.87 - 5.11 (MIL/uL)    Hemoglobin 11.6 (*) 12.0 - 15.0 (g/dL)    HCT 35.5 (*) 36.0 - 46.0 (%)    MCV 80.0  78.0 - 100.0 (fL)    MCH 26.1  26.0 - 34.0 (pg)    MCHC 32.7  30.0 - 36.0 (g/dL)    RDW 13.8  11.5 - 15.5 (%)    Platelets 245  150 - 400 (K/uL)   TROPONIN I     Status: Normal   Collection Time   08/17/11 12:00 PM      Component Value Range Comment   Troponin I <0.30  <0.30 (ng/mL)   BLOOD GAS, VENOUS     Status: Abnormal   Collection Time   08/17/11 12:02 PM      Component Value Range Comment   pH, Ven 7.379 (*) 7.250 - 7.300     pCO2, Ven 46.5  45.0 - 50.0 (mmHg)    pO2, Ven 19.4 (*) 30.0 - 45.0 (mmHg)    Bicarbonate 26.8 (*) 20.0 - 24.0 (mEq/L)    TCO2 24.9  0 - 100 (mmol/L)    Acid-Base Excess 1.7  0.0 - 2.0 (mmol/L)    O2 Saturation 30.4      Patient temperature 37.0      Drawn by COLLECTED BY LABORATORY     GLUCOSE, CAPILLARY     Status: Abnormal   Collection Time   08/17/11 12:02 PM      Component Value Range Comment   Glucose-Capillary 526 (*) 70 - 99 (mg/dL)    Comment 1 Notify RN     PROTIME-INR     Status: Abnormal   Collection Time   08/17/11 12:16 PM      Component Value Range Comment   Prothrombin Time 26.8 (*) 11.6 - 15.2 (seconds)    INR 2.43 (*) 0.00 - 1.49    GLUCOSE, CAPILLARY     Status: Abnormal   Collection Time   08/17/11  1:29 PM      Component Value Range Comment   Glucose-Capillary 479 (*) 70 - 99 (mg/dL)   GLUCOSE, CAPILLARY     Status: Abnormal   Collection Time   08/17/11  3:05 PM      Component Value Range Comment   Glucose-Capillary 247 (*) 70 - 99 (mg/dL)   URINALYSIS, ROUTINE W REFLEX MICROSCOPIC     Status: Abnormal   Collection Time   08/17/11  4:00 PM      Component Value Range Comment   Color,  Urine YELLOW  YELLOW     APPearance CLOUDY (*) CLEAR     Specific Gravity, Urine 1.034 (*) 1.005 - 1.030     pH 5.5  5.0 - 8.0     Glucose, UA >1000 (*) NEGATIVE (mg/dL)    Hgb urine dipstick NEGATIVE  NEGATIVE     Bilirubin Urine NEGATIVE  NEGATIVE     Ketones, ur TRACE (*) NEGATIVE (mg/dL)    Protein, ur NEGATIVE  NEGATIVE (mg/dL)    Urobilinogen, UA 0.2  0.0 - 1.0 (mg/dL)    Nitrite NEGATIVE  NEGATIVE     Leukocytes, UA NEGATIVE  NEGATIVE    URINE MICROSCOPIC-ADD ON     Status: Abnormal   Collection Time   08/17/11  4:00 PM      Component Value Range Comment   Squamous Epithelial / LPF MANY (*) RARE     WBC, UA 3-6  <3 (WBC/hpf)    RBC / HPF 0-2  <3 (RBC/hpf)    Bacteria, UA FEW (*) RARE     Urine-Other RARE YEAST       Radiological Exams on Admission: *RADIOLOGY REPORT*  Clinical Data: Hyperglycemia, hypertension  CHEST - 2 VIEW  Comparison: 03/29/2011  Findings: Cardiomediastinal silhouette is stable. No acute infiltrate or pleural effusion. No pulmonary edema. Bony thorax is stable.  IMPRESSION: No active disease.  Original Report Authenticated By: Lahoma Crocker, M.D.   Assessment/Plan Principal Problem:  *DM (diabetes mellitus) type II uncontrolled: Patient presented as described above, CBG was 526, ABG showed PH 7.379, bicarb 26.8, clearly ruling out DKA. Patient also did not appear to be hyperosmolar. She was managed with ivi fluids and insulin in the ED, and at this time, CBG has improved to 247. Patient will be admitted, and managed with diet, Lantus and SSI. We shall also check HBA1C, for completeness. No evidence of a precipitating infection, has been elicited so far, on CXR and urinalysis, and patient is afebrile, but given her recent history of significant pyrexia, as well as a leukocytosis of 17,000, we shall carry out septic workup and commence broad spectrum antibiotics, with Vancomycin/Zosyn. Active Problems:  1. Dehydration/Volume depletion: This is  confirmed by elevated BUN/Creat ratio, and is likely secondary to poor PO intake, against a background of continuing diuretic therapy, and hyperglycemia induced renal fluid loss. We shall hold Lasix, and manage with ivi fluids, albeit with care, given known history of low ejection fraction.  2. Acute kidney injury: Patient has ARI-on-CKD. This is secondary to #1 above. She has known baseline creatinine of 1.07-1.42 (06/2009). This is likely pre-renal, and improvement is anticipated. We shall follow renal indices.  3. CKD (chronic kidney disease):  See # 2 above.  4. History of atrial fibrillation: Patient appears to have remained in SR, since DC cardio version. She is currently on chronic anticoagulation, which will be managed by clinical pharmacologist. 5. Cardiomyopathy: Patient has known systolic dysfunction, with EF of 20%-25% in 06/2009. Clinically, she has no evidence of decompensation at this time. We shall need to monitor fluid status closely. Cardiac enzymes will be cycled for completeness.  Further management will depend on clinical course.  Comment: patient is FULL CODE.  Time Spent on Admission: 1 hour.  Raghad Lorenz,CHRISTOPHER 08/17/2011, 5:44 PM

## 2011-08-17 NOTE — ED Notes (Signed)
[id number] Expected date:08/17/11<BR> Expected time:11:38 AM<BR> Means of arrival:Ambulance<BR> Comments:<BR> EMS M90 GC -- Hyperglycemic/Hypotensive

## 2011-08-17 NOTE — ED Notes (Signed)
Per EMS- pt in c/o hyperglycemia and hypotension, states she went to PMD for generalized illness and weakness, noted at MD office that BP was 50/30 per office, pt normotensive at this time, dizziness with standing, CBG 598 at MD office

## 2011-08-17 NOTE — Progress Notes (Signed)
ANTIBIOTIC CONSULT NOTE - INITIAL  Pharmacy Consult for Vanc/Zosyn Indication: Sepsis  Allergies  Allergen Reactions  . Codeine Hives and Nausea And Vomiting   Patient Measurements: Height: 5\' 9"  (175.3 cm) Weight: 267 lb 6.7 oz (121.3 kg) IBW/kg (Calculated) : 66.2   Vital Signs: Temp: 98.6 F (37 C) (03/16 1900) Temp src: Oral (03/16 1900) BP: 146/77 mmHg (03/16 1900) Pulse Rate: 88  (03/16 1900) Intake/Output from previous day:   Intake/Output from this shift:    Labs:  Basename 08/17/11 1200  WBC 17.0*  HGB 11.6*  PLT 245  LABCREA --  CREATININE 1.66*   Estimated Creatinine Clearance: 46.4 ml/min (by C-G formula based on Cr of 1.66). No results found for this basename: VANCOTROUGH:2,VANCOPEAK:2,VANCORANDOM:2,GENTTROUGH:2,GENTPEAK:2,GENTRANDOM:2,TOBRATROUGH:2,TOBRAPEAK:2,TOBRARND:2,AMIKACINPEAK:2,AMIKACINTROU:2,AMIKACIN:2, in the last 72 hours   Microbiology: No results found for this or any previous visit (from the past 720 hour(s)).  Medical History: Past Medical History  Diagnosis Date  . Hyperkalemia   . Overweight   . CHF (congestive heart failure)   . HLD (hyperlipidemia)   . DM2 (diabetes mellitus, type 2)   . Depression   . History of bronchitis   . History of emphysema   . Atrial fibrillation     Cardioverted to NSR 08/22/2009  . CKD (chronic kidney disease)   . Mitral regurgitation     mild - echo - 1/11 /  Mild, echo, May15, 2012  . Tricuspid regurgitation     moderate - echo - 1/11  . Drug therapy 1/11    coumadin  . Thyroid dysfunction     thyromegally,diffuse,nodule LLL,06/2009  . Ejection fraction < 50%     20-25%, echo.06/2009, /  EF 30-35%, diffuse hypokinesis, echo, Oct 16, 2010  . LFT elevation     06/2009  . Cardiomyopathy     Medications:  Prescriptions prior to admission  Medication Sig Dispense Refill  . carvedilol (COREG) 12.5 MG tablet Take 1 tablet (12.5 mg total) by mouth 2 (two) times daily.  60 tablet  6  . enalapril  (VASOTEC) 10 MG tablet Take 1 tablet (10 mg total) by mouth 2 (two) times daily.  60 tablet  3  . furosemide (LASIX) 40 MG tablet Take 40 mg by mouth daily as needed. Fluid retention      . insulin aspart (NOVOLOG) 100 UNIT/ML injection Inject 15 Units into the skin at bedtime.       . lansoprazole (PREVACID) 15 MG capsule Take 15 mg by mouth daily.      . Liraglutide (VICTOZA) 18 MG/3ML SOLN Inject into the skin daily.       . Multiple Vitamin (MULITIVITAMIN WITH MINERALS) TABS Take 1 tablet by mouth daily.      . potassium chloride SA (K-DUR,KLOR-CON) 20 MEQ tablet Take 20 mEq by mouth daily.        Marland Kitchen warfarin (COUMADIN) 5 MG tablet Take 5-7.5 mg by mouth See admin instructions. Takes 1 tablet on fridays and Monday for 5mg  dosage....takes1.5 tablet every other day for 7.5mg  dosage       Anti-infectives     Start     Dose/Rate Route Frequency Ordered Stop   08/17/11 1800   vancomycin (VANCOCIN) IVPB 1000 mg/200 mL premix        1,000 mg 200 mL/hr over 60 Minutes Intravenous  Once 08/17/11 1712 08/17/11 1910         Assessment:  66yo F with recent URI and fever presents with hypotension and leukocytosis.  Empiric abx for possible sepsis,  source unclear.  Elevated SCr thought to be pre-renal. Baseline SCr 1.07-1.42.  Obese.  Goal of Therapy:  Vancomycin trough level 15-20 mcg/ml  Plan:   Zosyn 3.375g IV Q8H infused over 4hrs.  Vancomycin 1g now for total of 2g loading dose, then 1750mg  IV q24h starting 3/17.  Measure Vanc trough at steady state.  Follow up renal fxn and culture results.  [name] 08/17/2011,7:23 PM

## 2011-08-18 LAB — COMPREHENSIVE METABOLIC PANEL
ALT: 14 U/L (ref 0–35)
AST: 14 U/L (ref 0–37)
Albumin: 2.5 g/dL — ABNORMAL LOW (ref 3.5–5.2)
Alkaline Phosphatase: 118 U/L — ABNORMAL HIGH (ref 39–117)
BUN: 15 mg/dL (ref 6–23)
CO2: 25 mEq/L (ref 19–32)
Calcium: 9 mg/dL (ref 8.4–10.5)
Chloride: 98 mEq/L (ref 96–112)
Creatinine, Ser: 1.23 mg/dL — ABNORMAL HIGH (ref 0.50–1.10)
GFR calc Af Amer: 52 mL/min — ABNORMAL LOW (ref >90)
GFR calc non Af Amer: 45 mL/min — ABNORMAL LOW (ref >90)
Glucose, Bld: 263 mg/dL — ABNORMAL HIGH (ref 70–99)
Potassium: 3.8 mEq/L (ref 3.5–5.1)
Sodium: 132 mEq/L — ABNORMAL LOW (ref 135–145)
Total Bilirubin: 0.3 mg/dL (ref 0.3–1.2)
Total Protein: 6.6 g/dL (ref 6.0–8.3)

## 2011-08-18 LAB — TSH: TSH: 0.456 u[IU]/mL (ref 0.350–4.500)

## 2011-08-18 LAB — CBC
HCT: 32.9 % — ABNORMAL LOW (ref 36.0–46.0)
Hemoglobin: 10.7 g/dL — ABNORMAL LOW (ref 12.0–15.0)
MCH: 26 pg (ref 26.0–34.0)
MCHC: 32.5 g/dL (ref 30.0–36.0)
MCV: 79.9 fL (ref 78.0–100.0)
Platelets: 221 10*3/uL (ref 150–400)
RBC: 4.12 MIL/uL (ref 3.87–5.11)
RDW: 13.9 % (ref 11.5–15.5)
WBC: 11.2 10*3/uL — ABNORMAL HIGH (ref 4.0–10.5)

## 2011-08-18 LAB — GLUCOSE, CAPILLARY
Glucose-Capillary: 264 mg/dL — ABNORMAL HIGH (ref 70–99)
Glucose-Capillary: 253 mg/dL — ABNORMAL HIGH (ref 70–99)
Glucose-Capillary: 190 mg/dL — ABNORMAL HIGH (ref 70–99)
Glucose-Capillary: 232 mg/dL — ABNORMAL HIGH (ref 70–99)

## 2011-08-18 LAB — HEMOGLOBIN A1C
Hgb A1c MFr Bld: 15.1 % — ABNORMAL HIGH (ref <5.7)
Mean Plasma Glucose: 387 mg/dL — ABNORMAL HIGH (ref <117)

## 2011-08-18 LAB — PROTIME-INR
INR: 2.35 — ABNORMAL HIGH (ref 0.00–1.49)
Prothrombin Time: 26.1 seconds — ABNORMAL HIGH (ref 11.6–15.2)

## 2011-08-18 LAB — INFLUENZA PANEL BY PCR (TYPE A & B)
H1N1 flu by pcr: NOT DETECTED
Influenza A By PCR: NEGATIVE
Influenza B By PCR: NEGATIVE

## 2011-08-18 MED ORDER — INSULIN GLARGINE 100 UNIT/ML ~~LOC~~ SOLN
25.0000 [IU] | Freq: Every day | SUBCUTANEOUS | Status: DC
  Administered 2011-08-18: 25 [IU] via SUBCUTANEOUS

## 2011-08-18 MED ORDER — INSULIN ASPART 100 UNIT/ML ~~LOC~~ SOLN
4.0000 [IU] | Freq: Three times a day (TID) | SUBCUTANEOUS | Status: DC
  Administered 2011-08-18: 4 [IU] via SUBCUTANEOUS

## 2011-08-18 MED ORDER — WARFARIN SODIUM 5 MG PO TABS
5.0000 mg | ORAL_TABLET | Freq: Once | ORAL | Status: AC
  Administered 2011-08-18: 5 mg via ORAL
  Filled 2011-08-18: qty 1

## 2011-08-18 MED ORDER — SODIUM CHLORIDE 0.9 % IJ SOLN
10.0000 mL | Freq: Two times a day (BID) | INTRAMUSCULAR | Status: DC
  Administered 2011-08-18 – 2011-08-23 (×5): 10 mL via INTRAVENOUS

## 2011-08-18 NOTE — Progress Notes (Addendum)
ANTICOAGULATION CONSULT NOTE - Initial Consult  Pharmacy Consult for Coumadin Indication: atrial fibrillation  Allergies  Allergen Reactions  . Codeine Hives and Nausea And Vomiting   Patient Measurements: Height: 5\' 9"  (175.3 cm) Weight: 267 lb 6.7 oz (121.3 kg) IBW/kg (Calculated) : 66.2   Vital Signs: Temp: 97.8 F (36.6 C) (03/17 0600) Temp src: Oral (03/17 0600) BP: 122/72 mmHg (03/17 0600) Pulse Rate: 89  (03/17 0600)  Labs:  Basename 08/18/11 0710 08/17/11 1216 08/17/11 1200  HGB 10.7* -- 11.6*  HCT 32.9* -- 35.5*  PLT 221 -- 245  APTT -- -- --  LABPROT 26.1* 26.8* --  INR 2.35* 2.43* --  HEPARINUNFRC -- -- --  CREATININE 1.23* -- 1.66*  CKTOTAL -- -- --  CKMB -- -- --  TROPONINI -- -- <0.30   Estimated Creatinine Clearance: 62.6 ml/min (by C-G formula based on Cr of 1.23).  Medical History: Past Medical History  Diagnosis Date  . Hyperkalemia   . Overweight   . CHF (congestive heart failure)   . HLD (hyperlipidemia)   . DM2 (diabetes mellitus, type 2)   . Depression   . History of bronchitis   . History of emphysema   . Atrial fibrillation     Cardioverted to NSR 08/22/2009  . CKD (chronic kidney disease)   . Mitral regurgitation     mild - echo - 1/11 /  Mild, echo, May15, 2012  . Tricuspid regurgitation     moderate - echo - 1/11  . Drug therapy 1/11    coumadin  . Thyroid dysfunction     thyromegally,diffuse,nodule LLL,06/2009  . Ejection fraction < 50%     20-25%, echo.06/2009, /  EF 30-35%, diffuse hypokinesis, echo, Oct 16, 2010  . LFT elevation     06/2009  . Cardiomyopathy     Medications:  Prescriptions prior to admission  Medication Sig Dispense Refill  . carvedilol (COREG) 12.5 MG tablet Take 1 tablet (12.5 mg total) by mouth 2 (two) times daily.  60 tablet  6  . enalapril (VASOTEC) 10 MG tablet Take 1 tablet (10 mg total) by mouth 2 (two) times daily.  60 tablet  3  . furosemide (LASIX) 40 MG tablet Take 40 mg by mouth daily as  needed. Fluid retention      . insulin aspart (NOVOLOG) 100 UNIT/ML injection Inject 15 Units into the skin at bedtime.       . lansoprazole (PREVACID) 15 MG capsule Take 15 mg by mouth daily.      . Liraglutide (VICTOZA) 18 MG/3ML SOLN Inject into the skin daily.       . Multiple Vitamin (MULITIVITAMIN WITH MINERALS) TABS Take 1 tablet by mouth daily.      . potassium chloride SA (K-DUR,KLOR-CON) 20 MEQ tablet Take 20 mEq by mouth daily.        Marland Kitchen warfarin (COUMADIN) 5 MG tablet Take 5-7.5 mg by mouth See admin instructions. Takes 1 tablet on fridays and Monday for 5mg  dosage....takes1.5 tablet every other day for 7.5mg  dosage        Assessment:  66yo F on chronic Coumadin for A.fib.  Dose PTA = 5 mg on M/F and 7.5 mg on all other days  INR remains therapeutic today.   Poor appetite/po intake documented   CBC stable  No bleeding reported  Goal of Therapy:  INR 2-3   Plan:   Coumadin 5 mg tonight  Daily INR.  Maikayla Beggs, [name], pharmD 08/18/2011,8:21 AM

## 2011-08-18 NOTE — Progress Notes (Signed)
Subjective: Feels much better today, no new issues.  Objective: Vital signs in last 24 hours: Temp:  [97.8 F (36.6 C)-98.6 F (37 C)] 97.8 F (36.6 C) (03/17 0600) Pulse Rate:  [88-91] 89  (03/17 0600) Resp:  [12-20] 20  (03/17 0600) BP: (101-146)/(48-77) 122/72 mmHg (03/17 0600) SpO2:  [97 %-100 %] 97 % (03/17 0600) Weight:  [121.3 kg (267 lb 6.7 oz)] 121.3 kg (267 lb 6.7 oz) (03/16 1900) Weight change:  Last BM Date: 08/18/11  Intake/Output from previous day: 03/16 0701 - 03/17 0700 In: -  Out: 400 [Urine:400] Total I/O In: 240 [P.O.:240] Out: 300 [Urine:300]   Physical Exam: General: Patient does not appear to be in obvious acute distress at this time, alert, communicative, fully oriented, talking in complete sentences, not short of breath at rest.  HEENT: No clinical pallor, no jaundice, no conjunctival injection or discharge. Hydration status is satisfactory. NECK: Supple, JVP not seen, no carotid bruits, no palpable lymphadenopathy, no palpable goiter.  CHEST: Clinically clear to auscultation, no wheezes, no crackles.  HEART: Sounds 1 and 2 heard, normal, regular, no murmurs.  ABDOMEN: Obese, soft, non-tender, no palpable organomegaly, no palpable masses, normal bowel sounds.  GENITALIA: Not examined..  LOWER EXTREMITIES: No pitting edema, palpable peripheral pulses.  MUSCULOSKELETAL SYSTEM: Generalized osteoarthritic changes, otherwise, normal.  CENTRAL NERVOUS SYSTEM: No focal neurologic deficit on gross examination.  Lab Results:  Basename 08/18/11 0710 08/17/11 1200  WBC 11.2* 17.0*  HGB 10.7* 11.6*  HCT 32.9* 35.5*  PLT 221 245    Basename 08/18/11 0710 08/17/11 1200  NA 132* 129*  K 3.8 5.0  CL 98 92*  CO2 25 28  GLUCOSE 263* 515*  BUN 15 16  CREATININE 1.23* 1.66*  CALCIUM 9.0 9.4   No results found for this or any previous visit (from the past 240 hour(s)).   Studies/Results: Dg Chest 2 View  08/17/2011  *RADIOLOGY REPORT*  Clinical Data:  Hyperglycemia, hypertension  CHEST - 2 VIEW  Comparison: 03/29/2011  Findings: Cardiomediastinal silhouette is stable.  No acute infiltrate or pleural effusion.  No pulmonary edema.  Bony thorax is stable.  IMPRESSION: . No active disease.  Original Report Authenticated By: Lahoma Crocker, M.D.    Medications: Scheduled Meds:   .  HYDROmorphone (DILAUDID) injection  0.5 mg Intravenous Once  . insulin aspart  0-20 Units Subcutaneous TID WC  . insulin aspart  0-5 Units Subcutaneous QHS  . insulin glargine  20 Units Subcutaneous QHS  . lidocaine-EPINEPHrine  20 mL Infiltration Once  . mulitivitamin with minerals  1 tablet Oral Daily  . ondansetron (ZOFRAN) IV  4 mg Intravenous Once  . pantoprazole  40 mg Oral Daily  . piperacillin-tazobactam (ZOSYN)  IV  3.375 g Intravenous Q8H  . vancomycin  1,750 mg Intravenous Q24H  . vancomycin  1,000 mg Intravenous Once  . vancomycin  1,000 mg Intravenous Once  . warfarin  5 mg Oral ONCE-1800  . Warfarin - Pharmacist Dosing Inpatient   Does not apply q1800   Continuous Infusions:   . sodium chloride    . sodium chloride     PRN Meds:.acetaminophen, acetaminophen, HYDROmorphone, ondansetron (ZOFRAN) IV, ondansetron, DISCONTD: ondansetron (ZOFRAN) IV  Assessment/Plan:  Principal Problem:  *DM (diabetes mellitus) type II uncontrolled: Patient presented as described above, CBG was 526, ABG showed PH 7.379, bicarb 26.8, clearly ruling out DKA. Patient also did not appear to be hyperosmolar. She was managed with ivi fluids and insulin in the ED, with improvement  in CBG to 247. She has since been managed with diet, Lantus and SSI, and today, CBG has improved to 190, ad she feels considerably better. HBA1C is 15.1. No evidence of a precipitating infection has been elicited so far, on CXR and urinalysis, patient is afebrile, and leukocytosis of 17,000, has improved to 11,200 overnight. Blood cultures are negative, as is Flu PCR. Now on Vancomycin/Zosyn, day# 2. If  no new developments arise, we shall probably discontinue antibiotics on 08/19/11.  Active Problems:  1. Dehydration/Volume depletion: This is confirmed by elevated BUN/Creat ratio, and is likely secondary to poor PO intake, against a background of continuing diuretic therapy, and hyperglycemia-induced renal fluid loss. Lasix is on hold, and patient was managed with ivi fluids, albeit with care, given known history of low ejection fraction. Today, hydration status is satisfactory, she is maintaining good oral intake, so iv fluids have been discontinued 2. Acute kidney injury: Patient has ARI-on-CKD. This is secondary to #1 above. She has known baseline creatinine of 1.07-1.42 (06/2009). This is likely pre-renal, and has improved overnight.  Nephrotoxins, ie ACE-I are on hold, and further improvement is anticipated. We shall follow renal indices.  3. CKD (chronic kidney disease): See # 2 above.  4. History of atrial fibrillation: Patient appears to have remained in SR, since DC cardioversion. She is currently on chronic anticoagulation, which will be managed by clinical pharmacologist.  5. Cardiomyopathy: Patient has known systolic dysfunction, with EF of 20%-25% in 06/2009. Clinically, she has no evidence of decompensation at this time. We shall need to monitor fluid status closely. Cardiac enzymes are negative so far.  Comment: OOB.   LOS: 1 day   Olinda Nola,CHRISTOPHER 08/18/2011, 2:19 PM

## 2011-08-19 ENCOUNTER — Ambulatory Visit: Payer: Medicare Other | Admitting: Cardiology

## 2011-08-19 DIAGNOSIS — L03317 Cellulitis of buttock: Secondary | ICD-10-CM

## 2011-08-19 DIAGNOSIS — L02219 Cutaneous abscess of trunk, unspecified: Secondary | ICD-10-CM

## 2011-08-19 DIAGNOSIS — L0292 Furuncle, unspecified: Secondary | ICD-10-CM | POA: Diagnosis present

## 2011-08-19 DIAGNOSIS — L0231 Cutaneous abscess of buttock: Secondary | ICD-10-CM

## 2011-08-19 DIAGNOSIS — R21 Rash and other nonspecific skin eruption: Secondary | ICD-10-CM | POA: Diagnosis not present

## 2011-08-19 DIAGNOSIS — L03319 Cellulitis of trunk, unspecified: Secondary | ICD-10-CM

## 2011-08-19 DIAGNOSIS — IMO0002 Reserved for concepts with insufficient information to code with codable children: Secondary | ICD-10-CM

## 2011-08-19 LAB — PROTIME-INR
INR: 1.9 — ABNORMAL HIGH (ref 0.00–1.49)
Prothrombin Time: 22.1 seconds — ABNORMAL HIGH (ref 11.6–15.2)

## 2011-08-19 LAB — BASIC METABOLIC PANEL
BUN: 15 mg/dL (ref 6–23)
CO2: 24 mEq/L (ref 19–32)
Calcium: 8.7 mg/dL (ref 8.4–10.5)
Chloride: 100 mEq/L (ref 96–112)
Creatinine, Ser: 1.37 mg/dL — ABNORMAL HIGH (ref 0.50–1.10)
GFR calc Af Amer: 45 mL/min — ABNORMAL LOW (ref >90)
GFR calc non Af Amer: 39 mL/min — ABNORMAL LOW (ref >90)
Glucose, Bld: 259 mg/dL — ABNORMAL HIGH (ref 70–99)
Potassium: 3.6 mEq/L (ref 3.5–5.1)
Sodium: 133 mEq/L — ABNORMAL LOW (ref 135–145)

## 2011-08-19 LAB — GLUCOSE, CAPILLARY
Glucose-Capillary: 249 mg/dL — ABNORMAL HIGH (ref 70–99)
Glucose-Capillary: 225 mg/dL — ABNORMAL HIGH (ref 70–99)
Glucose-Capillary: 218 mg/dL — ABNORMAL HIGH (ref 70–99)
Glucose-Capillary: 233 mg/dL — ABNORMAL HIGH (ref 70–99)
Glucose-Capillary: 130 mg/dL — ABNORMAL HIGH (ref 70–99)
Glucose-Capillary: 119 mg/dL — ABNORMAL HIGH (ref 70–99)

## 2011-08-19 LAB — CBC
HCT: 32.7 % — ABNORMAL LOW (ref 36.0–46.0)
Hemoglobin: 10.4 g/dL — ABNORMAL LOW (ref 12.0–15.0)
MCH: 25.8 pg — ABNORMAL LOW (ref 26.0–34.0)
MCHC: 31.8 g/dL (ref 30.0–36.0)
MCV: 81.1 fL (ref 78.0–100.0)
Platelets: 254 10*3/uL (ref 150–400)
RBC: 4.03 MIL/uL (ref 3.87–5.11)
RDW: 14.1 % (ref 11.5–15.5)
WBC: 10.3 10*3/uL (ref 4.0–10.5)

## 2011-08-19 MED ORDER — INSULIN ASPART 100 UNIT/ML ~~LOC~~ SOLN
6.0000 [IU] | Freq: Three times a day (TID) | SUBCUTANEOUS | Status: DC
  Administered 2011-08-19 – 2011-08-24 (×15): 6 [IU] via SUBCUTANEOUS

## 2011-08-19 MED ORDER — POLYETHYLENE GLYCOL 3350 17 G PO PACK
17.0000 g | PACK | Freq: Every day | ORAL | Status: DC
  Administered 2011-08-19: 17 g via ORAL
  Filled 2011-08-19 (×6): qty 1

## 2011-08-19 MED ORDER — WARFARIN SODIUM 5 MG PO TABS
5.0000 mg | ORAL_TABLET | Freq: Once | ORAL | Status: AC
  Administered 2011-08-19: 5 mg via ORAL
  Filled 2011-08-19: qty 1

## 2011-08-19 MED ORDER — CLOTRIMAZOLE 1 % EX CREA
TOPICAL_CREAM | Freq: Two times a day (BID) | CUTANEOUS | Status: DC
  Administered 2011-08-19 – 2011-08-23 (×9): via TOPICAL
  Filled 2011-08-19: qty 15

## 2011-08-19 MED ORDER — INSULIN GLARGINE 100 UNIT/ML ~~LOC~~ SOLN
20.0000 [IU] | Freq: Two times a day (BID) | SUBCUTANEOUS | Status: DC
  Administered 2011-08-19 – 2011-08-21 (×5): 20 [IU] via SUBCUTANEOUS

## 2011-08-19 NOTE — Progress Notes (Signed)
OT Note:  Pt states that she is feeling better and has been doing ADLs and walking to bathroom independently. Confirmed with NT.  She lives alone, and has DME from when she took care of her mother, if she needs it.  Screen only.  [place], OTR/L [id number] 08/19/2011

## 2011-08-19 NOTE — Progress Notes (Signed)
CARE MANAGEMENT NOTE 08/19/2011  Patient:  [name], [name]   Account Number:  [id number]  Date Initiated:  08/19/2011  Documentation initiated by:  Darik Massing  Subjective/Objective Assessment:   pt with multiple abcesses on the buttocks, lateral ribs and under left breast some rquiring i&d and abx, cultures are pending     Action/Plan:   lives at home   Anticipated DC Date:  08/22/2011   Anticipated DC Plan:  HOME/SELF CARE  In-house referral  NA      DC Planning Services  NA      [facility] Choice  NA   Choice offered to / List presented to:  NA   DME arranged  NA      DME agency  NA     [place] arranged  NA      [place] agency  NA   Status of service:  In process, will continue to follow Medicare Important Message given?  NA - LOS <3 / Initial given by admissions (If response is "NO", the following Medicare IM given date fields will be blank) Date Medicare IM given:   Date Additional Medicare IM given:    Discharge Disposition:    Per UR Regulation:  Reviewed for med. necessity/level of care/duration of stay  If discussed at [place] of Stay Meetings, dates discussed:    Comments:  [id number]/Costa Jha,RN,BSN,CCM

## 2011-08-19 NOTE — Consult Note (Signed)
Reason for Consult:Recurrent Skin abscesses  Referring Physician: OTI  [name] is an 66 y.o. female.  HPI: Patient is a 66 year old African American female with a long history of diabetes and recurrent skin abscesses. This is been occurring for many years. She has a 3 week history of being sick and started with upper respiratory tract infection. She has become progressively weaker she's had weight loss fever and chills. She was admitted to the hospital on 08/17/2010 with a glucose of 515, creatinine of 1.66. She has an abscess on her right buttocks, her left breast, left axilla which we are asked to see. Her white blood count has improved, going from 17,000-10,300. Her INR has gone from 2.43 and a 1.9. We are asked to see and help with these abscesses.  Past Medical History  Diagnosis Date  . Hyperkalemia   . Overweight   . CHF (congestive heart failure)   . HLD (hyperlipidemia)   . DM2 (diabetes mellitus, type 2)   . Depression   . History of bronchitis   . History of emphysema   . Atrial fibrillation     Cardioverted to NSR 08/22/2009  . CKD (chronic kidney disease)   . Mitral regurgitation     mild - echo - 1/11 /  Mild, echo, May15, 2012  . Tricuspid regurgitation     moderate - echo - 1/11  . Drug therapy 1/11    coumadin  . Thyroid dysfunction     thyromegally,diffuse,nodule LLL,06/2009  . Ejection fraction < 50%     20-25%, echo.06/2009, /  EF 30-35%, diffuse hypokinesis, echo, Oct 16, 2010  . LFT elevation     06/2009  . Cardiomyopathy     Past Surgical History  Procedure Date  . Cholecystectomy   . Hysterectomy unknow   . Tonsillectomy   . Breast biopsy     Family History  Problem Relation Age of Onset  . Colon cancer    . Heart disease      Social History:  reports that she has been smoking.  She does not have any smokeless tobacco history on file. She reports that she does not drink alcohol or use illicit drugs.  Allergies:  Allergies  Allergen  Reactions  . Codeine Hives and Nausea And Vomiting    Medications:  Prior to Admission:  Prescriptions prior to admission  Medication Sig Dispense Refill  . carvedilol (COREG) 12.5 MG tablet Take 1 tablet (12.5 mg total) by mouth 2 (two) times daily.  60 tablet  6  . enalapril (VASOTEC) 10 MG tablet Take 1 tablet (10 mg total) by mouth 2 (two) times daily.  60 tablet  3  . furosemide (LASIX) 40 MG tablet Take 40 mg by mouth daily as needed. Fluid retention      . insulin aspart (NOVOLOG) 100 UNIT/ML injection Inject 15 Units into the skin at bedtime.       . lansoprazole (PREVACID) 15 MG capsule Take 15 mg by mouth daily.      . Liraglutide (VICTOZA) 18 MG/3ML SOLN Inject into the skin daily.       . Multiple Vitamin (MULITIVITAMIN WITH MINERALS) TABS Take 1 tablet by mouth daily.      . potassium chloride SA (K-DUR,KLOR-CON) 20 MEQ tablet Take 20 mEq by mouth daily.        Marland Kitchen warfarin (COUMADIN) 5 MG tablet Take 5-7.5 mg by mouth See admin instructions. Takes 1 tablet on fridays and Monday for 5mg  dosage....takes1.5 tablet every  other day for 7.5mg  dosage       Scheduled:   . clotrimazole   Topical BID  . insulin aspart  0-20 Units Subcutaneous TID WC  . insulin aspart  0-5 Units Subcutaneous QHS  . insulin aspart  6 Units Subcutaneous TID WC  . insulin glargine  20 Units Subcutaneous BID  . mulitivitamin with minerals  1 tablet Oral Daily  . pantoprazole  40 mg Oral Daily  . piperacillin-tazobactam (ZOSYN)  IV  3.375 g Intravenous Q8H  . polyethylene glycol  17 g Oral Daily  . sodium chloride  10 mL Intravenous Q12H  . vancomycin  1,750 mg Intravenous Q24H  . warfarin  5 mg Oral ONCE-1800  . Warfarin - Pharmacist Dosing Inpatient   Does not apply q1800  . DISCONTD: insulin aspart  4 Units Subcutaneous TID WC  . DISCONTD: insulin glargine  25 Units Subcutaneous QHS   Continuous:  [id number], acetaminophen, HYDROmorphone, ondansetron (ZOFRAN) IV,  ondansetron Anti-infectives     Start     Dose/Rate Route Frequency Ordered Stop   08/18/11 1800   vancomycin (VANCOCIN) 1,750 mg in sodium chloride 0.9 % 500 mL IVPB        1,750 mg 250 mL/hr over 120 Minutes Intravenous Every 24 hours 08/17/11 1929     08/17/11 2000   vancomycin (VANCOCIN) IVPB 1000 mg/200 mL premix        1,000 mg 200 mL/hr over 60 Minutes Intravenous  Once 08/17/11 1929 08/17/11 2200   08/17/11 2000   piperacillin-tazobactam (ZOSYN) IVPB 3.375 g        3.375 g 12.5 mL/hr over 240 Minutes Intravenous 3 times per day 08/17/11 1929     08/17/11 1800   vancomycin (VANCOCIN) IVPB 1000 mg/200 mL premix        1,000 mg 200 mL/hr over 60 Minutes Intravenous  Once 08/17/11 1712 08/17/11 1910          Results for orders placed during the hospital encounter of 08/17/11 (from the past 48 hour(s))  GLUCOSE, CAPILLARY     Status: Abnormal   Collection Time   08/17/11  6:53 PM      Component Value Range Comment   Glucose-Capillary 264 (*) 70 - 99 (mg/dL)    Comment 1 Notify RN     INFLUENZA PANEL BY PCR     Status: Normal   Collection Time   08/17/11  7:25 PM      Component Value Range Comment   Influenza A By PCR NEGATIVE  NEGATIVE     Influenza B By PCR NEGATIVE  NEGATIVE     H1N1 flu by pcr NOT DETECTED  NOT DETECTED    TSH     Status: Normal   Collection Time   08/17/11  7:50 PM      Component Value Range Comment   TSH 0.456  0.350 - 4.500 (uIU/mL)   HEMOGLOBIN A1C     Status: Abnormal   Collection Time   08/17/11  7:50 PM      Component Value Range Comment   Hemoglobin A1C 15.1 (*) <5.7 (%)    Mean Plasma Glucose 387 (*) <117 (mg/dL)   CULTURE, BLOOD (ROUTINE X 2)     Status: Normal (Preliminary result)   Collection Time   08/17/11  7:50 PM      Component Value Range Comment   Specimen Description Blood      Special Requests NONE      Culture  Setup Time [id number]  Culture        Value:        BLOOD CULTURE RECEIVED NO GROWTH TO DATE CULTURE WILL  BE HELD FOR 5 DAYS BEFORE ISSUING A FINAL NEGATIVE REPORT   Report Status PENDING     CULTURE, BLOOD (ROUTINE X 2)     Status: Normal (Preliminary result)   Collection Time   08/17/11  7:50 PM      Component Value Range Comment   Specimen Description Blood      Special Requests NONE      Culture  Setup Time OF:[id number]      Culture        Value:        BLOOD CULTURE RECEIVED NO GROWTH TO DATE CULTURE WILL BE HELD FOR 5 DAYS BEFORE ISSUING A FINAL NEGATIVE REPORT   Report Status PENDING     GLUCOSE, CAPILLARY     Status: Abnormal   Collection Time   08/17/11 10:15 PM      Component Value Range Comment   Glucose-Capillary 264 (*) 70 - 99 (mg/dL)   COMPREHENSIVE METABOLIC PANEL     Status: Abnormal   Collection Time   08/18/11  7:10 AM      Component Value Range Comment   Sodium 132 (*) 135 - 145 (mEq/L)    Potassium 3.8  3.5 - 5.1 (mEq/L)    Chloride 98  96 - 112 (mEq/L)    CO2 25  19 - 32 (mEq/L)    Glucose, Bld 263 (*) 70 - 99 (mg/dL)    BUN 15  6 - 23 (mg/dL)    Creatinine, Ser 1.23 (*) 0.50 - 1.10 (mg/dL)    Calcium 9.0  8.4 - 10.5 (mg/dL)    Total Protein 6.6  6.0 - 8.3 (g/dL)    Albumin 2.5 (*) 3.5 - 5.2 (g/dL)    AST 14  0 - 37 (U/L)    ALT 14  0 - 35 (U/L)    Alkaline Phosphatase 118 (*) 39 - 117 (U/L)    Total Bilirubin 0.3  0.3 - 1.2 (mg/dL)    GFR calc non Af Amer 45 (*) >90 (mL/min)    GFR calc Af Amer 52 (*) >90 (mL/min)   CBC     Status: Abnormal   Collection Time   08/18/11  7:10 AM      Component Value Range Comment   WBC 11.2 (*) 4.0 - 10.5 (K/uL)    RBC 4.12  3.87 - 5.11 (MIL/uL)    Hemoglobin 10.7 (*) 12.0 - 15.0 (g/dL)    HCT 32.9 (*) 36.0 - 46.0 (%)    MCV 79.9  78.0 - 100.0 (fL)    MCH 26.0  26.0 - 34.0 (pg)    MCHC 32.5  30.0 - 36.0 (g/dL)    RDW 13.9  11.5 - 15.5 (%)    Platelets 221  150 - 400 (K/uL)   PROTIME-INR     Status: Abnormal   Collection Time   08/18/11  7:10 AM      Component Value Range Comment   Prothrombin Time 26.1 (*) 11.6 -  15.2 (seconds)    INR 2.35 (*) 0.00 - 1.49    GLUCOSE, CAPILLARY     Status: Abnormal   Collection Time   08/18/11  7:39 AM      Component Value Range Comment   Glucose-Capillary 253 (*) 70 - 99 (mg/dL)    Comment 1 Notify RN     GLUCOSE, CAPILLARY  Status: Abnormal   Collection Time   08/18/11 11:43 AM      Component Value Range Comment   Glucose-Capillary 190 (*) 70 - 99 (mg/dL)    Comment 1 Notify RN     GLUCOSE, CAPILLARY     Status: Abnormal   Collection Time   08/18/11  4:45 PM      Component Value Range Comment   Glucose-Capillary 232 (*) 70 - 99 (mg/dL)   GLUCOSE, CAPILLARY     Status: Abnormal   Collection Time   08/18/11  9:46 PM      Component Value Range Comment   Glucose-Capillary 249 (*) 70 - 99 (mg/dL)   PROTIME-INR     Status: Abnormal   Collection Time   08/19/11  5:45 AM      Component Value Range Comment   Prothrombin Time 22.1 (*) 11.6 - 15.2 (seconds)    INR 1.90 (*) 0.00 - 1.49    CBC     Status: Abnormal   Collection Time   08/19/11  5:45 AM      Component Value Range Comment   WBC 10.3  4.0 - 10.5 (K/uL)    RBC 4.03  3.87 - 5.11 (MIL/uL)    Hemoglobin 10.4 (*) 12.0 - 15.0 (g/dL)    HCT 32.7 (*) 36.0 - 46.0 (%)    MCV 81.1  78.0 - 100.0 (fL)    MCH 25.8 (*) 26.0 - 34.0 (pg)    MCHC 31.8  30.0 - 36.0 (g/dL)    RDW 14.1  11.5 - 15.5 (%)    Platelets 254  150 - 400 (K/uL)   BASIC METABOLIC PANEL     Status: Abnormal   Collection Time   08/19/11  5:45 AM      Component Value Range Comment   Sodium 133 (*) 135 - 145 (mEq/L)    Potassium 3.6  3.5 - 5.1 (mEq/L)    Chloride 100  96 - 112 (mEq/L)    CO2 24  19 - 32 (mEq/L)    Glucose, Bld 259 (*) 70 - 99 (mg/dL)    BUN 15  6 - 23 (mg/dL)    Creatinine, Ser 1.37 (*) 0.50 - 1.10 (mg/dL)    Calcium 8.7  8.4 - 10.5 (mg/dL)    GFR calc non Af Amer 39 (*) >90 (mL/min)    GFR calc Af Amer 45 (*) >90 (mL/min)   GLUCOSE, CAPILLARY     Status: Abnormal   Collection Time   08/19/11  7:53 AM      Component  Value Range Comment   Glucose-Capillary 225 (*) 70 - 99 (mg/dL)    Comment 1 Notify RN     GLUCOSE, CAPILLARY     Status: Abnormal   Collection Time   08/19/11 12:00 PM      Component Value Range Comment   Glucose-Capillary 218 (*) 70 - 99 (mg/dL)    Comment 1 Notify RN     GLUCOSE, CAPILLARY     Status: Abnormal   Collection Time   08/19/11  1:48 PM      Component Value Range Comment   Glucose-Capillary 233 (*) 70 - 99 (mg/dL)    Comment 1 Notify RN       No results found.  Review of Systems  Constitutional: Positive for fever, chills, weight loss (13 lbs last month) and malaise/fatigue. Negative for diaphoresis.  HENT: Positive for sore throat.        Thought she had URI at  first, sick last 3 weeks  Eyes: Negative.   Respiratory: Positive for cough. Negative for hemoptysis. Shortness of breath: DOE.   Cardiovascular: Negative.   Gastrointestinal: Positive for heartburn, nausea, vomiting and constipation. Negative for abdominal pain and diarrhea.  Genitourinary: Frequency: Q 10 MIN BEFORE ADMISSION.  Musculoskeletal: Positive for joint pain.  Skin:       History of recurrent skin abscesses for many years  Neurological: Positive for weakness. Negative for dizziness, tingling, tremors, sensory change, speech change, focal weakness, seizures and loss of consciousness.  Endo/Heme/Allergies: Negative.   Psychiatric/Behavioral: Negative.    Blood pressure 136/81, pulse 85, temperature 97.7 F (36.5 C), temperature source Oral, resp. rate 18, height 5\' 9"  (1.753 m), weight 121.3 kg (267 lb 6.7 oz), SpO2 100.00%. Physical Exam  Constitutional: She is oriented to person, place, and time. She appears well-developed and well-nourished. No distress.       BMI 39,NAD  HENT:  Head: Normocephalic and atraumatic.  Nose: Nose normal.  Eyes: Conjunctivae and EOM are normal. Pupils are equal, round, and reactive to light. Right eye exhibits no discharge. Left eye exhibits no discharge. No  scleral icterus.  Neck: Normal range of motion. Neck supple. No JVD present. No tracheal deviation present. No thyromegaly present.  Cardiovascular: Normal rate, regular rhythm, normal heart sounds and intact distal pulses.   No murmur heard. Respiratory: Effort normal and breath sounds normal. No respiratory distress. She has no wheezes. She has no rales. She exhibits no tenderness.  GI: Soft. Bowel sounds are normal. She exhibits no distension and no mass. There is no tenderness. There is no rebound and no guarding.       mULTIPLE SURGICAL SCARS  Musculoskeletal: Normal range of motion. She exhibits no edema and no tenderness.  Lymphadenopathy:    She has no cervical adenopathy.  Neurological: She is alert and oriented to person, place, and time. She has normal reflexes. No cranial nerve deficit. Coordination normal.  Skin: Skin is warm and dry. She is not diaphoretic.       Right axilla, tender but hard area 1.5 cm in diam Right Buttocks, open area with 2x2  Opened and placed in 1 Cm abscess, clean and pink Left posterior lateral abscess about 1x4 cm.  Open and clean. Multiple scars from prior abscess formations.  Psychiatric: She has a normal mood and affect. Her behavior is normal. Judgment and thought content normal.    Assessment/Plan: 1. Multiple recurrent skin abscesses 2. Diabetes mellitus with poor control 3. Chronic atrial fibrillation on Coumadin 4. Cardiomyopathy with an EF of 20-25% 5. Hypertension 6. Chronic renal insufficiency 7. Hypertension 8. Ongoing tobacco use; history of emphysema 9. BMI of 39.6 10. Dyslipidemia  Plan:  The 2 open areas on Right buttocks and L breast are already open and clean.  Pt. Is on Zosyn and Vancomycin  Which should cover things well.. The area under the right arm can be treated with warm soaks.  I think if we could get her in the shower a couple times a day and redress the open areas they should heal without difficulty.  The one under the  arm should drain shortly with some warm  Compresses. If not we could drain at bedside.  Will check labs tomorrow and follow with you. Syler Norcia 08/19/2011, 5:00 PM

## 2011-08-19 NOTE — Progress Notes (Signed)
Agree with Pharmacy student assessment and plan. Continue with current plan.  Verdia Kuba, PharmD Pager: [phone] 08/19/2011 1:28 PM

## 2011-08-19 NOTE — Progress Notes (Signed)
Subjective: Feels better. C/O Itchy rash left side of face. Also, area of pain/induration. Right side. Objective: Vital signs in last 24 hours: Temp:  [98 F (36.7 C)-98.3 F (36.8 C)] 98.3 F (36.8 C) (03/18 0600) Pulse Rate:  [79-83] 79  (03/18 0600) Resp:  [18-20] 18  (03/18 0600) BP: (113-125)/(68-77) 124/77 mmHg (03/18 0600) SpO2:  [98 %-100 %] 100 % (03/18 0600) Weight change:  Last BM Date: 08/18/11  Intake/Output from previous day: 03/17 0701 - 03/18 0700 In: 20 [P.O.:240; IV Piggyback:250] Out: 300 [Urine:300]     Physical Exam: General: Patient does not appear to be in obvious acute distress at this time, alert, communicative, fully oriented, talking in complete sentences, not short of breath at rest.  HEENT: No clinical pallor, no jaundice, no conjunctival injection or discharge. Hydration status is satisfactory. Facial rash is noted, with serpinginous border affecting the right malar region. NECK: Supple, JVP not seen, no carotid bruits, no palpable lymphadenopathy, no palpable goiter.  CHEST: Clinically clear to auscultation, no wheezes, no crackles. Patient has wound on the inferior outer quadrant of left breast, about 1.5 cm in diameter, with mild purulence.  HEART: Sounds 1 and 2 heard, normal, regular, no murmurs.  ABDOMEN: Obese, soft, non-tender, no palpable Area of induration, redness and central fluctuance noted on right side, consistent with abscess. organomegaly, no palpable masses, normal bowel sounds. Patient has 2 wounds in the right buttock, at the site of I&D performed on 08/17/11. GENITALIA: Not examined.  LOWER EXTREMITIES: No pitting edema, palpable peripheral pulses.  MUSCULOSKELETAL SYSTEM: Generalized osteoarthritic changes, otherwise, normal.  CENTRAL NERVOUS SYSTEM: No focal neurologic deficit on gross examination.  Lab Results:  Basename 08/19/11 0545 08/18/11 0710  WBC 10.3 11.2*  HGB 10.4* 10.7*  HCT 32.7* 32.9*  PLT 254 221     Basename 08/19/11 0545 08/18/11 0710  NA 133* 132*  K 3.6 3.8  CL 100 98  CO2 24 25  GLUCOSE 259* 263*  BUN 15 15  CREATININE 1.37* 1.23*  CALCIUM 8.7 9.0   No results found for this or any previous visit (from the past 240 hour(s)).   Studies/Results: Dg Chest 2 View  08/17/2011  *RADIOLOGY REPORT*  Clinical Data: Hyperglycemia, hypertension  CHEST - 2 VIEW  Comparison: 03/29/2011  Findings: Cardiomediastinal silhouette is stable.  No acute infiltrate or pleural effusion.  No pulmonary edema.  Bony thorax is stable.  IMPRESSION: . No active disease.  Original Report Authenticated By: Lahoma Crocker, M.D.    Medications: Scheduled Meds:    . insulin aspart  0-20 Units Subcutaneous TID WC  . insulin aspart  0-5 Units Subcutaneous QHS  . insulin aspart  6 Units Subcutaneous TID WC  . insulin glargine  20 Units Subcutaneous BID  . mulitivitamin with minerals  1 tablet Oral Daily  . pantoprazole  40 mg Oral Daily  . piperacillin-tazobactam (ZOSYN)  IV  3.375 g Intravenous Q8H  . sodium chloride  10 mL Intravenous Q12H  . vancomycin  1,750 mg Intravenous Q24H  . warfarin  5 mg Oral ONCE-1800  . Warfarin - Pharmacist Dosing Inpatient   Does not apply q1800  . DISCONTD: insulin aspart  4 Units Subcutaneous TID WC  . DISCONTD: insulin glargine  20 Units Subcutaneous QHS  . DISCONTD: insulin glargine  25 Units Subcutaneous QHS  . DISCONTD: warfarin  5 mg Oral ONCE-1800   Continuous Infusions:    . DISCONTD: sodium chloride    . DISCONTD: sodium chloride  PRN Meds:.acetaminophen, acetaminophen, HYDROmorphone, ondansetron (ZOFRAN) IV, ondansetron  Assessment/Plan:  Principal Problem:  *DM (diabetes mellitus) type II uncontrolled: Patient presented as described above, CBG was 526, ABG showed PH 7.379, bicarb 26.8, clearly ruling out DKA. Patient also did not appear to be hyperosmolar. She was managed with ivi fluids and insulin in the ED, with improvement in CBG to 247. She  has since been managed with diet, Lantus and SSI, and today, CBG has improved, and she feels considerably better. Insulin has been adjusted further. HBA1C is 15.1. CXR is negative, as is urinalysis, patient is afebrile, and leukocytosis of 17,000, has improved. Blood cultures are negative, as is Flu PCR. Now on Vancomycin/Zosyn, day# 3. Patient complains today, of new area of induration and pain, in the left side, and physical exam revealed an abscess. As well as multiple wounds, ie, left breast, right buttock. This was not communicated at initial encounter, and explains leukocytosis and history of pyrexia, as well as poor diabetes control. Active Problems:  1. Furunculosis: Patient has multiple abscesses, as described above, involving left breast, right side, and right buttock. Also, skin exam shows multiple circumscribed  hyperpigmented areas, consistent with healed pyoderma. Patient is apyrexia, wcc has normalized. Buttock abscesses have already been I&D-ed in the ED on 08/17/11. The abscess in patient's right side, will need I&D. We shall consult surgery. On appropriate antibiotics (Vanc/Zosyn, day# 3 ). Will send off wound swab, and consult WOC. 2. Dehydration/Volume depletion: This is confirmed by elevated BUN/Creat ratio, and is likely secondary to poor PO intake, against a background of continuing diuretic therapy, and hyperglycemia-induced renal fluid loss. Lasix is on hold, and patient was managed with ivi fluids, albeit with care, given known history of low ejection fraction. Hydration status is satisfactory, she is maintaining good oral intake, so iv fluids have been discontinued on 08/18/11. 3. Acute kidney injury: Patient has ARI-on-CKD. This is secondary to #1 above. She has known baseline creatinine of 1.07-1.42 (06/2009). This is likely pre-renal, and has improved overnight.  Nephrotoxins, ie ACE-I are on hold, and further improvement is anticipated. We shall follow renal indices. Renal function  appears to be at baseline today. 4. CKD (chronic kidney disease): See # 2 above.  5. History of atrial fibrillation: Patient appears to have remained in SR, since DC cardioversion. She is currently on chronic anticoagulation, which will be managed by clinical pharmacologist.  6. Cardiomyopathy: Patient has known systolic dysfunction, with EF of 20%-25% in 06/2009. Clinically, she has no evidence of decompensation at this time. We shall need to monitor fluid status closely. Cardiac enzymes are negative so far. 7. Facial rash: this appears consistent with tinea, clinically. We shall manage with topical Lotrimin AF.  Comment: OOB.   LOS: 2 days   [name],CHRISTOPHER 08/19/2011, 9:00 AM

## 2011-08-19 NOTE — Consult Note (Signed)
WOC consult Note Reason for Consult: Chronic wounds beneath left breast and on right buttock Wound type: Infectious  Pressure Ulcer POA:No Measurement:right buttock measures 1cm x .4cm x 4cm-this area presented as a pustule and was "lanced" in ED. Left breast: 2cm x 3cm x .4cm. Patient states this also presented as a pustule or a boil.  It opened spontaneously at home about 1 week ago and patient has been dressing conservatively at home since. Wound bed: (both) clean, pink, moist Drainage (amount, consistency, odor) small amounts serous to sero sanguinous exudate Periwound:intact.  She has numerous places where evidence of these kinds of areas having previously existed on all body parts is noted.  Patient states she has been having them "pop up" since September of 2012. Dressing procedure/placement/frequency:I will recommend we fill the defect remaining from teh I&D on her right buttock with iodoform gauze and that we dress the open area full thickness beneath the left breast with NS dampened gauze and cover with a silicone dressing. Recommend you consult CCS for opening of other areas/pustules and for further guidance on wound care as needed. I will not follow.  Please re-consult if needed. Thanks, Maudie Flakes, MSN, RN, [facility] [place] [phone]):

## 2011-08-19 NOTE — Progress Notes (Signed)
ANTICOAGULATION CONSULT NOTE - Follow Up Consult  Pharmacy Consult for Coumadin Indication: atrial fibrillation  Allergies  Allergen Reactions  . Codeine Hives and Nausea And Vomiting    Patient Measurements: Height: 5\' 9"  (175.3 cm) Weight: 267 lb 6.7 oz (121.3 kg) IBW/kg (Calculated) : 66.2    Vital Signs: Temp: 98.3 F (36.8 C) (03/18 0600) Temp src: Oral (03/18 0600) BP: 124/77 mmHg (03/18 0600) Pulse Rate: 79  (03/18 0600)  Labs:  Basename 08/19/11 0545 08/18/11 0710 08/17/11 1216 08/17/11 1200  HGB 10.4* 10.7* -- --  HCT 32.7* 32.9* -- 35.5*  PLT 254 221 -- 245  APTT -- -- -- --  LABPROT 22.1* 26.1* 26.8* --  INR 1.90* 2.35* 2.43* --  HEPARINUNFRC -- -- -- --  CREATININE 1.37* 1.23* -- 1.66*  CKTOTAL -- -- -- --  CKMB -- -- -- --  TROPONINI -- -- -- <0.30   Estimated Creatinine Clearance: 56.2 ml/min (by C-G formula based on Cr of 1.37).   Medications:  Scheduled:    . clotrimazole   Topical BID  . insulin aspart  0-20 Units Subcutaneous TID WC  . insulin aspart  0-5 Units Subcutaneous QHS  . insulin aspart  6 Units Subcutaneous TID WC  . insulin glargine  20 Units Subcutaneous BID  . mulitivitamin with minerals  1 tablet Oral Daily  . pantoprazole  40 mg Oral Daily  . piperacillin-tazobactam (ZOSYN)  IV  3.375 g Intravenous Q8H  . polyethylene glycol  17 g Oral Daily  . sodium chloride  10 mL Intravenous Q12H  . vancomycin  1,750 mg Intravenous Q24H  . warfarin  5 mg Oral ONCE-1800  . Warfarin - Pharmacist Dosing Inpatient   Does not apply q1800  . DISCONTD: insulin aspart  4 Units Subcutaneous TID WC  . DISCONTD: insulin glargine  20 Units Subcutaneous QHS  . DISCONTD: insulin glargine  25 Units Subcutaneous QHS  . DISCONTD: warfarin  5 mg Oral ONCE-1800    Assessment: 65yo F on chronic Coumadin for A.fib.  Dose PTA = 5 mg on M/F and 7.5 mg on all other days INR decreased / subtherapeutic (2.35 >> 1.90) after Coumadin 5 mg yesterday, missed  3/16 dose with poor PO intake Concurrent Vanc/Zosyn CBC stable / no bleeding INR 3/16 2.43 >> 2.35 >> 1.90 SCr trending up, but stable (1.23 >> 1.37) Goal of Therapy:  INR 2-3   Plan:   Coumadin 5 mg PO x 1 dose today  F/U AM INR  James Ivanoff, PharmD Candidate 08/19/2011,10:43 AM

## 2011-08-19 NOTE — Clinical Documentation Improvement (Signed)
MALNUTRITION DOCUMENTATION CLARIFICATION  THIS DOCUMENT IS NOT A PERMANENT PART OF THE MEDICAL RECORD  TO RESPOND TO THE THIS QUERY, FOLLOW THE INSTRUCTIONS BELOW:  1. If needed, update documentation for the patient's encounter via the notes activity.  2. Access this query again and click edit on the In [place].  3. After updating, or not, click F2 to complete all highlighted (required) fields concerning your review. Select "additional documentation in the medical record" OR "no additional documentation provided".  4. Click Sign note button.  5. The deficiency will fall out of your In Basket *Please let us know if you are not able to complete this workflow by phone or e-mail (listed below).  Please update your documentation within the medical record to reflect your response to this query.                                                                                        08/19/11   Dear Dr.Arin Peral [facility],  In a better effort to capture your patient's severity of illness, reflect appropriate length of stay and utilization of resources, a review of the patient medical record has revealed the following indicators.    Based on your clinical judgment, please clarify and document in a progress note and/or discharge summary the clinical condition associated with the following supporting information:  In responding to this query please exercise your independent judgment.  The fact that a query is asked, does not imply that any particular answer is desired or expected.  Please clarify /validate  nutritional consult assessment  on 08/19/11  indicating pt with   .  Moderate Malnutrition   .  Moderate Protein Calorie Malnutrition   Other Condition (please specify)  Cannot Clinically Determine   Supporting Information:  Risk Factors:DMII, CKD, CHF Signs & Symptoms: -Ht:   72ft 9in     Wt:267 lbs  -BMI:39.49  -Weight  Loss:Pt reports poor appetite for the past 3 weeks with 13  pound unintentional loss  HbA1c was 15.1 on 08/17/11  -Albumin level:2.5 -Total Protein:6.6 -Calcium level:8.7  Treatments: Carb control diet Medications:Protonix, MVI  -Nutrition Consult:Pt  meets criteria for moderate PCM of acute illness AEB 4.6% weight loss and <75% energy intake for the past 3 weeks   You may use possible, probable, or suspect with inpatient documentation. possible, probable, suspected diagnoses MUST be documented at the time of discharge  Reviewed:  no additional documentation provided  Thank You,  [place] Documentation Specialist:  Pager [phone] E-mail [email or web address]  Thanks Aida Puffer, It appears that this patient does indeed have moderate protein-calorie malnutrition.  C. Olyvia Gopal. MD.

## 2011-08-19 NOTE — Progress Notes (Signed)
INITIAL ADULT NUTRITION ASSESSMENT Date: 08/19/2011   Time: 10:54 AM Reason for Assessment: Nutrition   ASSESSMENT: Female 66 y.o.  Dx: DM (diabetes mellitus) type II uncontrolled with renal manifestation  Hx:  Past Medical History  Diagnosis Date  . Hyperkalemia   . Overweight   . CHF (congestive heart failure)   . HLD (hyperlipidemia)   . DM2 (diabetes mellitus, type 2)   . Depression   . History of bronchitis   . History of emphysema   . Atrial fibrillation     Cardioverted to NSR 08/22/2009  . CKD (chronic kidney disease)   . Mitral regurgitation     mild - echo - 1/11 /  Mild, echo, May15, 2012  . Tricuspid regurgitation     moderate - echo - 1/11  . Drug therapy 1/11    coumadin  . Thyroid dysfunction     thyromegally,diffuse,nodule LLL,06/2009  . Ejection fraction < 50%     20-25%, echo.06/2009, /  EF 30-35%, diffuse hypokinesis, echo, Oct 16, 2010  . LFT elevation     06/2009  . Cardiomyopathy     Related Meds:  Scheduled Meds:   . clotrimazole   Topical BID  . insulin aspart  0-20 Units Subcutaneous TID WC  . insulin aspart  0-5 Units Subcutaneous QHS  . insulin aspart  6 Units Subcutaneous TID WC  . insulin glargine  20 Units Subcutaneous BID  . mulitivitamin with minerals  1 tablet Oral Daily  . pantoprazole  40 mg Oral Daily  . piperacillin-tazobactam (ZOSYN)  IV  3.375 g Intravenous Q8H  . polyethylene glycol  17 g Oral Daily  . sodium chloride  10 mL Intravenous Q12H  . vancomycin  1,750 mg Intravenous Q24H  . warfarin  5 mg Oral ONCE-1800  . Warfarin - Pharmacist Dosing Inpatient   Does not apply q1800  . DISCONTD: insulin aspart  4 Units Subcutaneous TID WC  . DISCONTD: insulin glargine  20 Units Subcutaneous QHS  . DISCONTD: insulin glargine  25 Units Subcutaneous QHS  . DISCONTD: warfarin  5 mg Oral ONCE-1800   Continuous Infusions:   . DISCONTD: sodium chloride    . DISCONTD: sodium chloride     PRN Meds:.acetaminophen, acetaminophen,  HYDROmorphone, ondansetron (ZOFRAN) IV, ondansetron   Ht: 5\' 9"  (175.3 cm)  Wt: 267 lb 6.7 oz (121.3 kg)  Ideal Wt: 145 lb % Ideal Wt: 184  Usual Wt: 280 lb % Usual Wt: 95  Body mass index is 39.49 kg/(m^2). Class II obesity  Food/Nutrition Related Hx: Pt reports poor appetite for the past 3 weeks with 13 pound unintentional loss. Pt reports appetite returned today and ate 100% of her breakfast. Pt reports not consistently following a diabetic diet and has actually been trying to eat a high protein diet for weight loss. Noted pt's HbA1c was 15.1 on 08/17/11. Explained to pt a healthy weight loss plan taking into account her diabetes and chronic kidney disease and warned against excess protein intake that could lead to kidney damage. Pt states a normal blood sugar for her at home is between 131-171 mg/dL.   Labs:  CMP     Component Value Date/Time   NA 133* 08/19/2011 0545   K 3.6 08/19/2011 0545   CL 100 08/19/2011 0545   CO2 24 08/19/2011 0545   GLUCOSE 259* 08/19/2011 0545   BUN 15 08/19/2011 0545   CREATININE 1.37* 08/19/2011 0545   CALCIUM 8.7 08/19/2011 0545   PROT 6.6 08/18/2011 0710  ALBUMIN 2.5* 08/18/2011 0710   AST 14 08/18/2011 0710   ALT 14 08/18/2011 0710   ALKPHOS 118* 08/18/2011 0710   BILITOT 0.3 08/18/2011 0710   GFRNONAA 39* 08/19/2011 0545   GFRAA 45* 08/19/2011 0545   CBG (last 3)   Basename 08/19/11 0753 08/18/11 2146 08/18/11 1645  GLUCAP 225* 249* 232*   Lab Results  Component Value Date   HGBA1C 15.1* 08/17/2011     Intake/Output Summary (Last 24 hours) at 08/19/11 1155 Last data filed at 08/19/11 0600  Gross per 24 hour  Intake    250 ml  Output      0 ml  Net    250 ml   Last BM - 08/18/11  Diet Order: Carb Control  IVF:    DISCONTD: sodium chloride  DISCONTD: sodium chloride    Estimated Nutritional Needs:   Kcal:1650-1900 Protein:65-80g Fluid:1.5-1.8L  NUTRITION DIAGNOSIS: -Food and nutrition related knowledge deficit (NB-1.1).   Status: Ongoing -Pt meets criteria for moderate PCM of acute illness AEB 4.6% weight loss and <75% energy intake for the past 3 weeks.   RELATED TO: healthy diet for weight loss  AS EVIDENCE BY: pt statement  MONITORING/EVALUATION(Goals): Pt to understand and implement diabetic diet and monitor portion sizes for weight loss.   EDUCATION NEEDS: -Education needs addressed - provided pt with diabetic diet handout and reviewed importance of eating a variety of foods from all food groups and monitoring carbohydrate intake and portion sizes for weight loss.   INTERVENTION: Education needs addressed - unsure if pt will change her ways r/t HbA1C of 15, however adherence encouraged. Will monitor.   Dietitian #: [phone]  [place] Per approved criteria  -Non-severe (moderate) malnutrition in the context of acute illness or injury -Obesity unspecified     [name] 08/19/2011, 10:54 AM

## 2011-08-20 LAB — CBC
HCT: 39.1 % (ref 36.0–46.0)
Hemoglobin: 12.2 g/dL (ref 12.0–15.0)
MCH: 25.5 pg — ABNORMAL LOW (ref 26.0–34.0)
MCHC: 31.2 g/dL (ref 30.0–36.0)
MCV: 81.8 fL (ref 78.0–100.0)
Platelets: 328 10*3/uL (ref 150–400)
RBC: 4.78 MIL/uL (ref 3.87–5.11)
RDW: 14.1 % (ref 11.5–15.5)
WBC: 11.6 10*3/uL — ABNORMAL HIGH (ref 4.0–10.5)

## 2011-08-20 LAB — GLUCOSE, CAPILLARY
Glucose-Capillary: 156 mg/dL — ABNORMAL HIGH (ref 70–99)
Glucose-Capillary: 132 mg/dL — ABNORMAL HIGH (ref 70–99)
Glucose-Capillary: 150 mg/dL — ABNORMAL HIGH (ref 70–99)
Glucose-Capillary: 166 mg/dL — ABNORMAL HIGH (ref 70–99)

## 2011-08-20 LAB — URINE CULTURE
Colony Count: 9000
Culture  Setup Time: 201303181311

## 2011-08-20 LAB — BASIC METABOLIC PANEL
BUN: 15 mg/dL (ref 6–23)
CO2: 23 mEq/L (ref 19–32)
Calcium: 9 mg/dL (ref 8.4–10.5)
Chloride: 100 mEq/L (ref 96–112)
Creatinine, Ser: 1.33 mg/dL — ABNORMAL HIGH (ref 0.50–1.10)
GFR calc Af Amer: 47 mL/min — ABNORMAL LOW (ref >90)
GFR calc non Af Amer: 41 mL/min — ABNORMAL LOW (ref >90)
Glucose, Bld: 168 mg/dL — ABNORMAL HIGH (ref 70–99)
Potassium: 4.1 mEq/L (ref 3.5–5.1)
Sodium: 134 mEq/L — ABNORMAL LOW (ref 135–145)

## 2011-08-20 LAB — PROTIME-INR
INR: 1.55 — ABNORMAL HIGH (ref 0.00–1.49)
Prothrombin Time: 18.9 seconds — ABNORMAL HIGH (ref 11.6–15.2)

## 2011-08-20 LAB — MRSA PCR SCREENING: MRSA by PCR: NEGATIVE

## 2011-08-20 MED ORDER — WARFARIN SODIUM 7.5 MG PO TABS
7.5000 mg | ORAL_TABLET | Freq: Once | ORAL | Status: AC
  Administered 2011-08-20: 7.5 mg via ORAL
  Filled 2011-08-20: qty 1

## 2011-08-20 NOTE — Progress Notes (Signed)
Pt's MRSA PCR is negative.  Contact Isolations discontinued per results/protocol.

## 2011-08-20 NOTE — Progress Notes (Signed)
ANTIBIOTIC CONSULT NOTE - FOLLOW UP  Pharmacy Consult for Vanc/Zosyn Indication: Sepsis  Allergies  Allergen Reactions  . Codeine Hives and Nausea And Vomiting    Patient Measurements: Height: 5\' 9"  (175.3 cm) Weight: 267 lb 6.7 oz (121.3 kg) IBW/kg (Calculated) : 66.2    Vital Signs: Temp: 98.1 F (36.7 C) (03/19 0600) Temp src: Oral (03/19 0600) BP: 149/80 mmHg (03/19 0600) Pulse Rate: 77  (03/19 0600) Intake/Output from previous day: 03/18 0701 - 03/19 0700 In: 150 [IV Piggyback:150] Out: -  Intake/Output from this shift:    Labs:  Basename 08/20/11 0520 08/19/11 0545 08/18/11 0710  WBC 11.6* 10.3 11.2*  HGB 12.2 10.4* 10.7*  PLT 328 254 221  LABCREA -- -- --  CREATININE 1.33* 1.37* 1.23*   Estimated Creatinine Clearance: 57.9 ml/min (by C-G formula based on Cr of 1.33). No results found for this basename: VANCOTROUGH:2,VANCOPEAK:2,VANCORANDOM:2,GENTTROUGH:2,GENTPEAK:2,GENTRANDOM:2,TOBRATROUGH:2,TOBRAPEAK:2,TOBRARND:2,AMIKACINPEAK:2,AMIKACINTROU:2,AMIKACIN:2, in the last 72 hours   Microbiology: Recent Results (from the past 720 hour(s))  CULTURE, BLOOD (ROUTINE X 2)     Status: Normal (Preliminary result)   Collection Time   08/17/11  7:50 PM      Component Value Range Status Comment   Specimen Description Blood   Final    Special Requests NONE   Final    Culture  Setup Time QP:[id number]   Final    Culture     Final    Value:        BLOOD CULTURE RECEIVED NO GROWTH TO DATE CULTURE WILL BE HELD FOR 5 DAYS BEFORE ISSUING A FINAL NEGATIVE REPORT   Report Status PENDING   Incomplete   CULTURE, BLOOD (ROUTINE X 2)     Status: Normal (Preliminary result)   Collection Time   08/17/11  7:50 PM      Component Value Range Status Comment   Specimen Description Blood   Final    Special Requests NONE   Final    Culture  Setup Time OF:[id number]   Final    Culture     Final    Value:        BLOOD CULTURE RECEIVED NO GROWTH TO DATE CULTURE WILL BE HELD FOR 5 DAYS BEFORE  ISSUING A FINAL NEGATIVE REPORT   Report Status PENDING   Incomplete   WOUND CULTURE     Status: Normal (Preliminary result)   Collection Time   08/19/11  9:42 AM      Component Value Range Status Comment   Specimen Description BREAST   Final    Special Requests NONE   Final    Gram Stain     Final    Value: NO WBC SEEN     NO SQUAMOUS EPITHELIAL CELLS SEEN     NO ORGANISMS SEEN   Culture PENDING   Incomplete    Report Status PENDING   Incomplete   WOUND CULTURE     Status: Normal (Preliminary result)   Collection Time   08/19/11  9:56 AM      Component Value Range Status Comment   Specimen Description WOUND BUTTOCKS   Final    Special Requests Normal   Final    Gram Stain     Final    Value: FEW WBC PRESENT,BOTH PMN AND MONONUCLEAR     NO SQUAMOUS EPITHELIAL CELLS SEEN     NO ORGANISMS SEEN   Culture PENDING   Incomplete    Report Status PENDING   Incomplete     Anti-infectives  Start     Dose/Rate Route Frequency Ordered Stop   08/18/11 1800   vancomycin (VANCOCIN) 1,750 mg in sodium chloride 0.9 % 500 mL IVPB        1,750 mg 250 mL/hr over 120 Minutes Intravenous Every 24 hours 08/17/11 1929     08/17/11 2000   vancomycin (VANCOCIN) IVPB 1000 mg/200 mL premix        1,000 mg 200 mL/hr over 60 Minutes Intravenous  Once 08/17/11 1929 08/17/11 2200   08/17/11 2000  piperacillin-tazobactam (ZOSYN) IVPB 3.375 g       3.375 g 12.5 mL/hr over 240 Minutes Intravenous 3 times per day 08/17/11 1929     08/17/11 1800   vancomycin (VANCOCIN) IVPB 1000 mg/200 mL premix        1,000 mg 200 mL/hr over 60 Minutes Intravenous  Once 08/17/11 1712 08/17/11 1910          Assessment:  66yo F with recent URI and fever presents with hypotension and leukocytosis.  Recurrent skin abscesses on left side and right buttocks  Elevated SCr thought to be pre-renal. Baseline SCr 1.07-1.42.  3/19: SCr stable at 1.33.  Afebrile, WBC down from admission (17 >> 11.6), but up from yesterday  (10.3 >> 11.6).  D#4 Zosyn EI / Vanc 2g LD then 1750 q24h  Blood / Wound cultures: NGTD, Urine culture pending   Goal of Therapy:  Vancomycin trough level 15-20 mcg/ml  Plan:  Follow up culture results  Continue Vanc/Zosyn and follow MD for duration of therapy  Continue to monitor renal function with Abx dosing  James Ivanoff, PharmD Candidate 08/20/2011,8:24 AM

## 2011-08-20 NOTE — Progress Notes (Signed)
ANTICOAGULATION CONSULT NOTE - Follow Up Consult  Pharmacy Consult for Coumadin Indication: atrial fibrillation  Allergies  Allergen Reactions  . Codeine Hives and Nausea And Vomiting    Patient Measurements: Height: 5\' 9"  (175.3 cm) Weight: 267 lb 6.7 oz (121.3 kg) IBW/kg (Calculated) : 66.2    Vital Signs: Temp: 98.1 F (36.7 C) (03/19 0600) Temp src: Oral (03/19 0600) BP: 149/80 mmHg (03/19 0600) Pulse Rate: 77  (03/19 0600)  Labs:  Basename 08/20/11 0520 08/19/11 0545 08/18/11 0710 08/17/11 1200  HGB 12.2 10.4* -- --  HCT 39.1 32.7* 32.9* --  PLT 328 254 221 --  APTT -- -- -- --  LABPROT 18.9* 22.1* 26.1* --  INR 1.55* 1.90* 2.35* --  HEPARINUNFRC -- -- -- --  CREATININE 1.33* 1.37* 1.23* --  CKTOTAL -- -- -- --  CKMB -- -- -- --  TROPONINI -- -- -- <0.30   Estimated Creatinine Clearance: 57.9 ml/min (by C-G formula based on Cr of 1.33).   Medications:  Scheduled:    . clotrimazole   Topical BID  . insulin aspart  0-20 Units Subcutaneous TID WC  . insulin aspart  0-5 Units Subcutaneous QHS  . insulin aspart  6 Units Subcutaneous TID WC  . insulin glargine  20 Units Subcutaneous BID  . mulitivitamin with minerals  1 tablet Oral Daily  . pantoprazole  40 mg Oral Daily  . piperacillin-tazobactam (ZOSYN)  IV  3.375 g Intravenous Q8H  . polyethylene glycol  17 g Oral Daily  . sodium chloride  10 mL Intravenous Q12H  . vancomycin  1,750 mg Intravenous Q24H  . warfarin  5 mg Oral ONCE-1800  . Warfarin - Pharmacist Dosing Inpatient   Does not apply q1800  . DISCONTD: insulin aspart  4 Units Subcutaneous TID WC  . DISCONTD: insulin glargine  25 Units Subcutaneous QHS    Assessment:  65yo F on chronic Coumadin for A.fib.  Dose PTA = 5 mg on M/F and 7.5 mg on all other days  INR decreased again (1.90 >> 1.55) on 5 mg x2 days after missing 3/16 dose.  INR 3/16 2.43 >> 2.35 >> 1.90 >> 1.55  Concurrent Vanc/Zosyn  CBC stable, no bleeding  Goal of  Therapy:  INR 2-3   Plan:   Coumadin 7.5 mg PO x 1 for tonight's dose  F/U AM INR  James Ivanoff, PharmD Candidate 08/20/2011,8:13 AM

## 2011-08-20 NOTE — Progress Notes (Signed)
Subjective: Feels better. Facial rash has improved, she feels stronger, and is ambulating.. Also, area of pain/induration. Right side. Objective: Vital signs in last 24 hours: Temp:  [97.6 F (36.4 C)-98.1 F (36.7 C)] 98 F (36.7 C) (03/19 1332) Pulse Rate:  [77-80] 80  (03/19 1332) Resp:  [18-20] 20  (03/19 1332) BP: (128-149)/(67-80) 128/75 mmHg (03/19 1332) SpO2:  [93 %-100 %] 98 % (03/19 1332) Weight change:  Last BM Date: 08/20/11  Intake/Output from previous day: 03/18 0701 - 03/19 0700 In: 150 [IV Piggyback:150] Out: -  Total I/O In: 240 [P.O.:240] Out: 700 [Urine:700]   Physical Exam: General: Comfortable, alert, communicative, fully oriented, talking in complete sentences, not short of breath at rest.  HEENT: No clinical pallor, no jaundice, no conjunctival injection or discharge. Hydration status is satisfactory. Facial rash with serpinginous border affecting the right malar region appears already improved. NECK: Supple, JVP not seen, no carotid bruits, no palpable lymphadenopathy, no palpable goiter.  CHEST: Clinically clear to auscultation, no wheezes, no crackles. Patient has wound on the inferior outer quadrant of left breast, is under dressings, as is another on lower anterior chest wall. HEART: Sounds 1 and 2 heard, normal, regular, no murmurs.  ABDOMEN: Obese, soft, non-tender. Area of induration, redness and central fluctuance noted on right side, consistent with abscess, now pointing. No palpable organomegaly, no palpable masses, normal bowel sounds. Patient has 2 wounds in the right buttock, at the site of I&D performed on 08/17/11, now under dressings. GENITALIA: Not examined.  LOWER EXTREMITIES: No pitting edema, palpable peripheral pulses.  MUSCULOSKELETAL SYSTEM: Generalized osteoarthritic changes, otherwise, normal.  CENTRAL NERVOUS SYSTEM: No focal neurologic deficit on gross examination.  Lab Results:  Basename 08/20/11 0520 08/19/11 0545  WBC 11.6*  10.3  HGB 12.2 10.4*  HCT 39.1 32.7*  PLT 328 254    Basename 08/20/11 0520 08/19/11 0545  NA 134* 133*  K 4.1 3.6  CL 100 100  CO2 23 24  GLUCOSE 168* 259*  BUN 15 15  CREATININE 1.33* 1.37*  CALCIUM 9.0 8.7   Recent Results (from the past 240 hour(s))  CULTURE, BLOOD (ROUTINE X 2)     Status: Normal (Preliminary result)   Collection Time   08/17/11  7:50 PM      Component Value Range Status Comment   Specimen Description Blood   Final    Special Requests NONE   Final    Culture  Setup Time [id number]   Final    Culture     Final    Value:        BLOOD CULTURE RECEIVED NO GROWTH TO DATE CULTURE WILL BE HELD FOR 5 DAYS BEFORE ISSUING A FINAL NEGATIVE REPORT   Report Status PENDING   Incomplete   CULTURE, BLOOD (ROUTINE X 2)     Status: Normal (Preliminary result)   Collection Time   08/17/11  7:50 PM      Component Value Range Status Comment   Specimen Description Blood   Final    Special Requests NONE   Final    Culture  Setup Time [id number]   Final    Culture     Final    Value:        BLOOD CULTURE RECEIVED NO GROWTH TO DATE CULTURE WILL BE HELD FOR 5 DAYS BEFORE ISSUING A FINAL NEGATIVE REPORT   Report Status PENDING   Incomplete   URINE CULTURE     Status: Normal   Collection Time  08/19/11  9:30 AM      Component Value Range Status Comment   Specimen Description URINE, RANDOM   Final    Special Requests NONE   Final    Culture  Setup Time [id number]   Final    Colony Count 9,000 COLONIES/ML   Final    Culture INSIGNIFICANT GROWTH   Final    Report Status 08/20/2011 FINAL   Final   WOUND CULTURE     Status: Normal (Preliminary result)   Collection Time   08/19/11  9:42 AM      Component Value Range Status Comment   Specimen Description BREAST   Final    Special Requests NONE   Final    Gram Stain     Final    Value: NO WBC SEEN     NO SQUAMOUS EPITHELIAL CELLS SEEN     NO ORGANISMS SEEN   Culture     Final    Value: FEW STAPHYLOCOCCUS AUREUS      Note: RIFAMPIN AND GENTAMICIN SHOULD NOT BE USED AS SINGLE DRUGS FOR TREATMENT OF STAPH INFECTIONS.   Report Status PENDING   Incomplete   WOUND CULTURE     Status: Normal (Preliminary result)   Collection Time   08/19/11  9:56 AM      Component Value Range Status Comment   Specimen Description WOUND BUTTOCKS   Final    Special Requests Normal   Final    Gram Stain     Final    Value: FEW WBC PRESENT,BOTH PMN AND MONONUCLEAR     NO SQUAMOUS EPITHELIAL CELLS SEEN     NO ORGANISMS SEEN   Culture     Final    Value: FEW STAPHYLOCOCCUS AUREUS     Note: RIFAMPIN AND GENTAMICIN SHOULD NOT BE USED AS SINGLE DRUGS FOR TREATMENT OF STAPH INFECTIONS.   Report Status PENDING   Incomplete      Studies/Results: No results found.  Medications: Scheduled Meds:    . clotrimazole   Topical BID  . insulin aspart  0-20 Units Subcutaneous TID WC  . insulin aspart  0-5 Units Subcutaneous QHS  . insulin aspart  6 Units Subcutaneous TID WC  . insulin glargine  20 Units Subcutaneous BID  . mulitivitamin with minerals  1 tablet Oral Daily  . pantoprazole  40 mg Oral Daily  . piperacillin-tazobactam (ZOSYN)  IV  3.375 g Intravenous Q8H  . polyethylene glycol  17 g Oral Daily  . sodium chloride  10 mL Intravenous Q12H  . vancomycin  1,750 mg Intravenous Q24H  . warfarin  5 mg Oral ONCE-1800  . warfarin  7.5 mg Oral ONCE-1800  . Warfarin - Pharmacist Dosing Inpatient   Does not apply q1800   Continuous Infusions:   PRN Meds:.acetaminophen, acetaminophen, HYDROmorphone, ondansetron (ZOFRAN) IV, ondansetron  Assessment/Plan:  Principal Problem:  *DM (diabetes mellitus) type II uncontrolled: Patient presented as described above, CBG was 526, ABG showed PH 7.379, bicarb 26.8, clearly ruling out DKA. Patient also did not appear to be hyperosmolar. She was managed with ivi fluids and insulin in the ED, with improvement in CBG to 247. She has since been managed with diet, Lantus and SSI, and today, CBG  has improved, and she feels considerably better. Insulin has been adjusted as indicated. HBA1C is 15.1. CXR is negative, as is urinalysis, patient is afebrile, and leukocytosis of 17,000, has improved, down to 11,600. Blood cultures are negative, as is Flu PCR. Now on Vancomycin/Zosyn, day#  4. Patient has multiple wounds, ie, left breast, right buttock, as well as right side abscess. This explains leukocytosis and history of pyrexia, as well as poor diabetes control. Active Problems:  1. Furunculosis: Patient has multiple abscesses, as described above, involving left breast, right side, and right buttock. Also, skin exam shows multiple circumscribed  hyperpigmented areas, consistent with healed pyoderma. Patient is apyrexial, wcc has normalized. Buttock abscesses have already been I&D-ed in the ED on 08/17/11. The surgical team was consulted for abscess in patient's right side, and warm compresses have been recommended. On appropriate antibiotics (Vanc/Zosyn, day# 4). Will send off wound swab grew Staph aureus. 2. Dehydration/Volume depletion: This was confirmed by elevated BUN/Creat ratio, and is likely secondary to poor PO intake, against a background of continuing diuretic therapy, and hyperglycemia-induced renal fluid loss. Lasix is on hold, and patient was managed with ivi fluids, albeit with care, given known history of low ejection fraction. Hydration status is now satisfactory, she is maintaining good oral intake, so iv fluids have been discontinued on 08/18/11. 3. Acute kidney injury: Patient has ARI-on-CKD. This is secondary to #1 above. She has known baseline creatinine of 1.07-1.42 (06/2009). This is likely pre-renal, and has improved overnight.  Nephrotoxins, ie ACE-I are on hold, and further improvement is anticipated. We shall follow renal indices. Renal function appears to be at baseline today, with creatinine at 1.33. 4. CKD (chronic kidney disease): See # 2 above.  5. History of atrial  fibrillation: Patient appears to have remained in SR, since DC cardioversion. She is currently on chronic anticoagulation, which will be managed by clinical pharmacologist.  6. Cardiomyopathy: Patient has known systolic dysfunction, with EF of 20%-25% in 06/2009. Clinically, she has no evidence of decompensation at this time. We shall need to monitor fluid status closely. Cardiac enzymes were negative. 7. Facial rash: this appears consistent with tinea, clinically. Managing with topical Lotrimin AF.  Comment: Contact Isolation, till MRSA ruled out.   LOS: 3 days   [name],CHRISTOPHER 08/20/2011, 3:36 PM

## 2011-08-20 NOTE — Progress Notes (Signed)
Inpatient Diabetes Program Recommendations  AACE/ADA: New Consensus Statement on Inpatient Glycemic Control (2009)  Target Ranges:  Prepandial:   less than 140 mg/dL      Peak postprandial:   less than 180 mg/dL (1-2 hours)      Critically ill patients:  140 - 180 mg/dL  Hyperglycemia - Elevated HgbA1C  Results for [name], [name] (MRN [id number]) as of 08/20/2011 15:27  Ref. Range 08/18/2011 16:45 08/18/2011 21:46 08/19/2011 07:53 08/19/2011 12:00 08/19/2011 13:48 08/19/2011 17:37 08/19/2011 21:44 08/20/2011 07:21 08/20/2011 11:43  Glucose-Capillary Latest Range: 70-99 mg/dL 232 (H) 249 (H) 225 (H) 218 (H) 233 (H) 130 (H) 119 (H) 156 (H) 132 (H)  Results for [name], [name] (MRN [id number]) as of 08/20/2011 15:27  Ref. Range 08/17/2011 19:50  Hemoglobin A1C Latest Range: <5.7 % 15.1 (H)    Inpatient Diabetes Program Recommendations Outpatient Referral: OP Diabetes Education consult for HgbA1C > 7% and BMI of 39.  Blood sugars in good control today.  Will follow.

## 2011-08-20 NOTE — Progress Notes (Signed)
  Subjective: Got in shower early this AM, starting warm soaks, to Right axilla, Eating breakfast.  Notes it usually takes a couple days for these to drain when they get like the one under her arm   Objective: Vital signs in last 24 hours: Temp:  [97.6 F (36.4 C)-98.1 F (36.7 C)] 98.1 F (36.7 C) (03/19 0600) Pulse Rate:  [77-85] 77  (03/19 0600) Resp:  [18-19] 18  (03/19 0600) BP: (136-149)/(67-81) 149/80 mmHg (03/19 0600) SpO2:  [93 %-100 %] 93 % (03/19 0600) Last BM Date: 08/18/11  Intake/Output from previous day: 03/18 0701 - 03/19 0700 In: 150 [IV Piggyback:150] Out: -  Intake/Output this shift:    General appearance: alert, cooperative and no distress Skin: Skin color, texture, turgor normal. No rashes or lesions or Area under R axilla, hard, skin is thinning, & tender.  Lab Results:   [facility] 08/20/11 0520 08/19/11 0545  WBC 11.6* 10.3  HGB 12.2 10.4*  HCT 39.1 32.7*  PLT 328 254    BMET  Basename 08/20/11 0520 08/19/11 0545  NA 134* 133*  K 4.1 3.6  CL 100 100  CO2 23 24  GLUCOSE 168* 259*  BUN 15 15  CREATININE 1.33* 1.37*  CALCIUM 9.0 8.7   PT/INR  Basename 08/20/11 0520 08/19/11 0545  LABPROT 18.9* 22.1*  INR 1.55* 1.90*     Lab 08/18/11 0710  AST 14  ALT 14  ALKPHOS 118*  BILITOT 0.3  PROT 6.6  ALBUMIN 2.5*     Lipase     Component Value Date/Time   LIPASE 19 06/26/2009 1910     Studies/Results: No results found.  Medications:    . clotrimazole   Topical BID  . insulin aspart  0-20 Units Subcutaneous TID WC  . insulin aspart  0-5 Units Subcutaneous QHS  . insulin aspart  6 Units Subcutaneous TID WC  . insulin glargine  20 Units Subcutaneous BID  . mulitivitamin with minerals  1 tablet Oral Daily  . pantoprazole  40 mg Oral Daily  . piperacillin-tazobactam (ZOSYN)  IV  3.375 g Intravenous Q8H  . polyethylene glycol  17 g Oral Daily  . sodium chloride  10 mL Intravenous Q12H  . vancomycin  1,750 mg Intravenous Q24H    . warfarin  5 mg Oral ONCE-1800  . Warfarin - Pharmacist Dosing Inpatient   Does not apply q1800  . DISCONTD: insulin aspart  4 Units Subcutaneous TID WC  . DISCONTD: insulin glargine  25 Units Subcutaneous QHS    Assessment/Plan 1. Multiple recurrent skin abscesses  2. Diabetes mellitus with poor control HBA1C 15.1 3. Chronic atrial fibrillation on Coumadin  4. Cardiomyopathy with an EF of 20-25%  5. Hypertension  6. Chronic renal insufficiency  7. Hypertension  8. Ongoing tobacco use; history of emphysema  9. BMI of 39.6  10. Dyslipidemia   Plan:  Kpad, and warm soaks to axilla,  Continue antibiotics and glucose control. If it doesn't open we can always do a bedside I&D, with her history I would give it a couple day.      LOS: 3 days    [name] 08/20/2011

## 2011-08-20 NOTE — Progress Notes (Signed)
Physical Therapy Note  Order received. Chart reviewed. Spoke briefly with pt who reports ambulating without assistance in room and hallway. Pt denies need for therapy services. PT will sign off. Thanks.   Weston Anna, PT  [phone]

## 2011-08-21 LAB — CBC
HCT: 35.5 % — ABNORMAL LOW (ref 36.0–46.0)
Hemoglobin: 11.1 g/dL — ABNORMAL LOW (ref 12.0–15.0)
MCH: 25.8 pg — ABNORMAL LOW (ref 26.0–34.0)
MCHC: 31.3 g/dL (ref 30.0–36.0)
MCV: 82.4 fL (ref 78.0–100.0)
Platelets: 293 10*3/uL (ref 150–400)
RBC: 4.31 MIL/uL (ref 3.87–5.11)
RDW: 14.3 % (ref 11.5–15.5)
WBC: 8.2 10*3/uL (ref 4.0–10.5)

## 2011-08-21 LAB — PROTIME-INR
INR: 1.55 — ABNORMAL HIGH (ref 0.00–1.49)
Prothrombin Time: 18.9 seconds — ABNORMAL HIGH (ref 11.6–15.2)

## 2011-08-21 LAB — GLUCOSE, CAPILLARY
Glucose-Capillary: 205 mg/dL — ABNORMAL HIGH (ref 70–99)
Glucose-Capillary: 151 mg/dL — ABNORMAL HIGH (ref 70–99)
Glucose-Capillary: 125 mg/dL — ABNORMAL HIGH (ref 70–99)
Glucose-Capillary: 120 mg/dL — ABNORMAL HIGH (ref 70–99)

## 2011-08-21 LAB — VANCOMYCIN, TROUGH: Vancomycin Tr: 11.5 ug/mL (ref 10.0–20.0)

## 2011-08-21 LAB — BASIC METABOLIC PANEL
BUN: 12 mg/dL (ref 6–23)
CO2: 24 mEq/L (ref 19–32)
Calcium: 8.6 mg/dL (ref 8.4–10.5)
Chloride: 107 mEq/L (ref 96–112)
Creatinine, Ser: 1.16 mg/dL — ABNORMAL HIGH (ref 0.50–1.10)
GFR calc Af Amer: 56 mL/min — ABNORMAL LOW (ref >90)
GFR calc non Af Amer: 48 mL/min — ABNORMAL LOW (ref >90)
Glucose, Bld: 203 mg/dL — ABNORMAL HIGH (ref 70–99)
Potassium: 4 mEq/L (ref 3.5–5.1)
Sodium: 138 mEq/L (ref 135–145)

## 2011-08-21 LAB — WOUND CULTURE
Gram Stain: NONE SEEN
Special Requests: NORMAL

## 2011-08-21 MED ORDER — INSULIN GLARGINE 100 UNIT/ML ~~LOC~~ SOLN
25.0000 [IU] | Freq: Two times a day (BID) | SUBCUTANEOUS | Status: DC
  Administered 2011-08-21: 22:00:00 via SUBCUTANEOUS
  Administered 2011-08-22: 25 [IU] via SUBCUTANEOUS
  Administered 2011-08-22: 22:00:00 via SUBCUTANEOUS
  Administered 2011-08-23: 25 [IU] via SUBCUTANEOUS

## 2011-08-21 MED ORDER — WARFARIN SODIUM 7.5 MG PO TABS
7.5000 mg | ORAL_TABLET | Freq: Once | ORAL | Status: AC
  Administered 2011-08-21: 7.5 mg via ORAL
  Filled 2011-08-21: qty 1

## 2011-08-21 MED ORDER — VANCOMYCIN HCL 1000 MG IV SOLR
1250.0000 mg | Freq: Two times a day (BID) | INTRAVENOUS | Status: DC
  Administered 2011-08-22 – 2011-08-24 (×5): 1250 mg via INTRAVENOUS
  Filled 2011-08-21 (×6): qty 1250

## 2011-08-21 NOTE — Progress Notes (Signed)
ANTICOAGULATION CONSULT NOTE - Follow Up Consult  Pharmacy Consult for Coumadin Indication: atrial fibrillation  Allergies  Allergen Reactions  . Codeine Hives and Nausea And Vomiting    Patient Measurements: Height: 5\' 9"  (175.3 cm) Weight: 267 lb 6.7 oz (121.3 kg) IBW/kg (Calculated) : 66.2   Vital Signs: Temp: 98.8 F (37.1 C) (03/20 0556) Temp src: Oral (03/20 0556) BP: 150/92 mmHg (03/20 0556) Pulse Rate: 84  (03/20 0556)  Labs:  Basename 08/21/11 0539 08/20/11 0520 08/19/11 0545  HGB 11.1* 12.2 --  HCT 35.5* 39.1 32.7*  PLT 293 328 254  APTT -- -- --  LABPROT 18.9* 18.9* 22.1*  INR 1.55* 1.55* 1.90*  HEPARINUNFRC -- -- --  CREATININE 1.16* 1.33* 1.37*  CKTOTAL -- -- --  CKMB -- -- --  TROPONINI -- -- --   Estimated Creatinine Clearance: 66.4 ml/min (by C-G formula based on Cr of 1.16).   Medications:  Scheduled:    . clotrimazole   Topical BID  . insulin aspart  0-20 Units Subcutaneous TID WC  . insulin aspart  0-5 Units Subcutaneous QHS  . insulin aspart  6 Units Subcutaneous TID WC  . insulin glargine  20 Units Subcutaneous BID  . mulitivitamin with minerals  1 tablet Oral Daily  . pantoprazole  40 mg Oral Daily  . piperacillin-tazobactam (ZOSYN)  IV  3.375 g Intravenous Q8H  . polyethylene glycol  17 g Oral Daily  . sodium chloride  10 mL Intravenous Q12H  . vancomycin  1,750 mg Intravenous Q24H  . warfarin  7.5 mg Oral ONCE-1800  . Warfarin - Pharmacist Dosing Inpatient   Does not apply q1800    Assessment:  66yo F on chronic Coumadin for A.fib.  Dose PTA = 5 mg on M/F and 7.5 mg on all other days  Missed 3/16 dose  INR stable at 1.55 after 7.5 mg dose yesterday and 5 mg x 2 days before that  INR 3/16 2.43 >> 2.35 >> 1.90 >> 1.55 >> 1.55  Concurrent Vanc/Zosyn  Goal of Therapy:  INR 2-3   Plan:   Warfarin 7.5 mg PO x 1 tonight  F/U AM INR  James Ivanoff, PharmD Candidate 08/21/2011,8:31 AM

## 2011-08-21 NOTE — Progress Notes (Signed)
DAILY PROGRESS NOTE                              [facility]  SUBJECTIVE: Denies fever chills, no significant complaints.  OBJECTIVE: BP 150/92  Pulse 84  Temp(Src) 98.8 F (37.1 C) (Oral)  Resp 18  Ht 5\' 9"  (1.753 m)  Wt 121.3 kg (267 lb 6.7 oz)  BMI 39.49 kg/m2  SpO2 100%  Intake/Output Summary (Last 24 hours) at 08/21/11 1252 Last data filed at 08/21/11 0845  Gross per 24 hour  Intake    990 ml  Output    300 ml  Net    690 ml                      Weight change:  Physical Exam: General: Alert and awake oriented x3 not in any acute distress. HEENT: anicteric sclera, pupils equal reactive to light and accommodation CVS: S1-S2 heard, no murmur rubs or gallops Chest: clear to auscultation bilaterally, no wheezing rales or rhonchi Abdomen:  normal bowel sounds, soft, nontender, nondistended, no organomegaly Neuro: Cranial nerves II-XII intact, no focal neurological deficits Extremities: no cyanosis, no clubbing or edema noted bilaterally   Lab Results:  [facility] 08/21/11 0539 08/20/11 0520  NA 138 134*  K 4.0 4.1  CL 107 100  CO2 24 23  GLUCOSE 203* 168*  BUN 12 15  CREATININE 1.16* 1.33*  CALCIUM 8.6 9.0  MG -- --  PHOS -- --   Basename 08/21/11 0539 08/20/11 0520  WBC 8.2 11.6*  NEUTROABS -- --  HGB 11.1* 12.2  HCT 35.5* 39.1  MCV 82.4 81.8  PLT 293 328   Micro Results: Recent Results (from the past 240 hour(s))  CULTURE, BLOOD (ROUTINE X 2)     Status: Normal (Preliminary result)   Collection Time   08/17/11  7:50 PM      Component Value Range Status Comment   Specimen Description Blood   Final    Special Requests NONE   Final    Culture  Setup Time [id number]   Final    Culture     Final    Value:        BLOOD CULTURE RECEIVED NO GROWTH TO DATE CULTURE WILL BE HELD FOR 5 DAYS BEFORE ISSUING A FINAL NEGATIVE REPORT   Report Status PENDING   Incomplete   CULTURE, BLOOD (ROUTINE X 2)     Status: Normal (Preliminary  result)   Collection Time   08/17/11  7:50 PM      Component Value Range Status Comment   Specimen Description Blood   Final    Special Requests NONE   Final    Culture  Setup Time [id number]   Final    Culture     Final    Value:        BLOOD CULTURE RECEIVED NO GROWTH TO DATE CULTURE WILL BE HELD FOR 5 DAYS BEFORE ISSUING A FINAL NEGATIVE REPORT   Report Status PENDING   Incomplete   URINE CULTURE     Status: Normal   Collection Time   08/19/11  9:30 AM      Component Value Range Status Comment   Specimen Description URINE, RANDOM   Final    Special Requests NONE   Final    Culture  Setup Time [id number]   Final    Colony Count 9,000 COLONIES/ML   Final  Culture INSIGNIFICANT GROWTH   Final    Report Status 08/20/2011 FINAL   Final   WOUND CULTURE     Status: Normal   Collection Time   08/19/11  9:42 AM      Component Value Range Status Comment   Specimen Description BREAST   Final    Special Requests NONE   Final    Gram Stain     Final    Value: NO WBC SEEN     NO SQUAMOUS EPITHELIAL CELLS SEEN     NO ORGANISMS SEEN   Culture     Final    Value: FEW STAPHYLOCOCCUS AUREUS     Note: RIFAMPIN AND GENTAMICIN SHOULD NOT BE USED AS SINGLE DRUGS FOR TREATMENT OF STAPH INFECTIONS.   Report Status 08/21/2011 FINAL   Final    Organism ID, Bacteria STAPHYLOCOCCUS AUREUS   Final   WOUND CULTURE     Status: Normal   Collection Time   08/19/11  9:56 AM      Component Value Range Status Comment   Specimen Description WOUND BUTTOCKS   Final    Special Requests Normal   Final    Gram Stain     Final    Value: FEW WBC PRESENT,BOTH PMN AND MONONUCLEAR     NO SQUAMOUS EPITHELIAL CELLS SEEN     NO ORGANISMS SEEN   Culture     Final    Value: FEW METHICILLIN RESISTANT STAPHYLOCOCCUS AUREUS     Note: RIFAMPIN AND GENTAMICIN SHOULD NOT BE USED AS SINGLE DRUGS FOR TREATMENT OF STAPH INFECTIONS. This organism DOES NOT demonstrate inducible Clindamycin resistance in vitro. CRITICAL RESULT  CALLED TO, READ BACK BY AND VERIFIED WITH: JACQUELINE      THIGPEN 08/21/11 0840 BY SMITHERSJ   Report Status 08/21/2011 FINAL   Final    Organism ID, Bacteria METHICILLIN RESISTANT STAPHYLOCOCCUS AUREUS   Final   MRSA PCR SCREENING     Status: Normal   Collection Time   08/20/11  4:28 PM      Component Value Range Status Comment   MRSA by PCR NEGATIVE  NEGATIVE  Final     Studies/Results: Dg Chest 2 View  08/17/2011  *RADIOLOGY REPORT*  Clinical Data: Hyperglycemia, hypertension  CHEST - 2 VIEW  Comparison: 03/29/2011  Findings: Cardiomediastinal silhouette is stable.  No acute infiltrate or pleural effusion.  No pulmonary edema.  Bony thorax is stable.  IMPRESSION: . No active disease.  Original Report Authenticated By: Lahoma Crocker, M.D.   Medications: Scheduled Meds:   . clotrimazole   Topical BID  . insulin aspart  0-20 Units Subcutaneous TID WC  . insulin aspart  0-5 Units Subcutaneous QHS  . insulin aspart  6 Units Subcutaneous TID WC  . insulin glargine  20 Units Subcutaneous BID  . mulitivitamin with minerals  1 tablet Oral Daily  . pantoprazole  40 mg Oral Daily  . piperacillin-tazobactam (ZOSYN)  IV  3.375 g Intravenous Q8H  . polyethylene glycol  17 g Oral Daily  . sodium chloride  10 mL Intravenous Q12H  . vancomycin  1,750 mg Intravenous Q24H  . warfarin  7.5 mg Oral ONCE-1800  . warfarin  7.5 mg Oral ONCE-1800  . Warfarin - Pharmacist Dosing Inpatient   Does not apply q1800   Continuous Infusions:  PRN Meds:.acetaminophen, acetaminophen, HYDROmorphone, ondansetron (ZOFRAN) IV, ondansetron  ASSESSMENT & PLAN: Principal Problem:  *DM (diabetes mellitus) type II uncontrolled with renal manifestation Active Problems:  Dehydration  Acute  kidney injury  CKD (chronic kidney disease)  History of atrial fibrillation  Furunculosis of multiple sites  Rash, skin  Furunculosis -Involving left breast, right buttock, status post I&D in the emergency department on  08/17/2011. -General surgery is following, patient has recurrent multiple skin infections/abscesses. -Patient is on vancomycin and Zosyn.  Diabetes mellitus type 2 -Uncontrolled with hemoglobin A1c of 15.1 -Insulin adjusted, I will increase the insulin dose to 25 units twice a day.  Hyperglycemia -At the time of admission her blood glucose level was 526. -There was no acidosis or hyperosmolar hyperglycemia. -Her insulin was adjusted. Patient hydrated aggressively. With IV fluids.  Acute renal insufficiency on CKD stage III -This is likely secondary to dehydration, from the osmotic diuresis secondary to hyperglycemia. -Resolved now, patient back to her baseline. Creatinine is 1.1.  History of atrial fibrillation -Patient has history of Tc cardioversion she remains in sinus rhythm now. -She is on chronic anticoagulation managed by pharmacy.  Cardiomyopathy -LVEF of 23% and January 2011, clinically now no evidence of decompensation.    LOS: 4 days   [name] A 08/21/2011, 12:52 PM

## 2011-08-21 NOTE — Progress Notes (Signed)
Inpatient Diabetes Program Recommendations  AACE/ADA: New Consensus Statement on Inpatient Glycemic Control (2009)  Target Ranges:  Prepandial:   less than 140 mg/dL      Peak postprandial:   less than 180 mg/dL (1-2 hours)      Critically ill patients:  140 - 180 mg/dL   Reason for Visit: Hyperglycemia Results for [name], [name] (MRN [id number]) as of 08/21/2011 14:44  Ref. Range 08/20/2011 17:10 08/20/2011 21:53 08/21/2011 07:02 08/21/2011 12:14  Glucose-Capillary Latest Range: 70-99 mg/dL 150 (H) 166 (H) 205 (H) 151 (H)     Inpatient Diabetes Program Recommendations Insulin - Meal Coverage: Increase meal coverage insulin to Novolog 8 units tidwc Outpatient Referral: .  Note: Will follow.

## 2011-08-21 NOTE — Progress Notes (Signed)
ANTIBIOTIC CONSULT NOTE - FOLLOW UP  Pharmacy Consult for Vanc/Zosyn Indication: r/o sepsis  Allergies  Allergen Reactions  . Codeine Hives and Nausea And Vomiting    Patient Measurements: Height: 5\' 9"  (175.3 cm) Weight: 267 lb 6.7 oz (121.3 kg) IBW/kg (Calculated) : 66.2   Vital Signs: Temp: 98.1 F (36.7 C) (03/20 1452) Temp src: Oral (03/20 1452) BP: 120/66 mmHg (03/20 1452) Pulse Rate: 76  (03/20 1452) Intake/Output from previous day: 03/19 0701 - 03/20 0700 In: 630 [P.O.:480; IV Piggyback:150] Out: 701 [Urine:700; Stool:1] Intake/Output from this shift: Total I/O In: 770 [P.O.:720; IV Piggyback:50] Out: -   Labs:  Basename 08/21/11 0539 08/20/11 0520 08/19/11 0545  WBC 8.2 11.6* 10.3  HGB 11.1* 12.2 10.4*  PLT 293 328 254  LABCREA -- -- --  CREATININE 1.16* 1.33* 1.37*   Estimated Creatinine Clearance: 66.4 ml/min (by C-G formula based on Cr of 1.16).  Basename 08/21/11 1745  VANCOTROUGH 11.5  VANCOPEAK --  VANCORANDOM --  GENTTROUGH --  GENTPEAK --  GENTRANDOM --  TOBRATROUGH --  TOBRAPEAK --  TOBRARND --  AMIKACINPEAK --  AMIKACINTROU --  AMIKACIN --    Assessment:  73 YOF on D5 of Vanc and Zosyn for r/o sepsis.  Renal fxn improved since abx started.  Vanc trough low today at 11.5.  Pt currently receiving Vanc 1750 mg IV q24h.  1700 dose today already given.    Goal of Therapy:  Vancomycin trough level 15-20 mcg/ml  Plan:   Change Vancomycin 1250 mg IV q12h, next dose due at 0600 AM 3/21  Vanessa [place] Thi 08/21/2011,6:35 PM

## 2011-08-22 LAB — BASIC METABOLIC PANEL
BUN: 10 mg/dL (ref 6–23)
CO2: 24 mEq/L (ref 19–32)
Calcium: 8.5 mg/dL (ref 8.4–10.5)
Chloride: 108 mEq/L (ref 96–112)
Creatinine, Ser: 1.15 mg/dL — ABNORMAL HIGH (ref 0.50–1.10)
GFR calc Af Amer: 56 mL/min — ABNORMAL LOW (ref >90)
GFR calc non Af Amer: 48 mL/min — ABNORMAL LOW (ref >90)
Glucose, Bld: 94 mg/dL (ref 70–99)
Potassium: 3.1 mEq/L — ABNORMAL LOW (ref 3.5–5.1)
Sodium: 140 mEq/L (ref 135–145)

## 2011-08-22 LAB — GLUCOSE, CAPILLARY
Glucose-Capillary: 85 mg/dL (ref 70–99)
Glucose-Capillary: 135 mg/dL — ABNORMAL HIGH (ref 70–99)
Glucose-Capillary: 119 mg/dL — ABNORMAL HIGH (ref 70–99)
Glucose-Capillary: 185 mg/dL — ABNORMAL HIGH (ref 70–99)

## 2011-08-22 LAB — PROTIME-INR
INR: 1.76 — ABNORMAL HIGH (ref 0.00–1.49)
Prothrombin Time: 20.8 seconds — ABNORMAL HIGH (ref 11.6–15.2)

## 2011-08-22 MED ORDER — PREDNISONE 20 MG PO TABS
40.0000 mg | ORAL_TABLET | Freq: Every day | ORAL | Status: DC
  Administered 2011-08-22 – 2011-08-23 (×2): 40 mg via ORAL
  Filled 2011-08-22 (×2): qty 2

## 2011-08-22 MED ORDER — PREDNISOLONE 5 MG PO TABS: 40.0000 mg | ORAL_TABLET | Freq: Every day | ORAL | Status: DC

## 2011-08-22 MED ORDER — WARFARIN SODIUM 7.5 MG PO TABS
7.5000 mg | ORAL_TABLET | Freq: Once | ORAL | Status: AC
  Administered 2011-08-22: 7.5 mg via ORAL
  Filled 2011-08-22: qty 1

## 2011-08-22 MED ORDER — COLCHICINE 0.6 MG PO TABS
0.6000 mg | ORAL_TABLET | Freq: Every day | ORAL | Status: DC
  Administered 2011-08-22 – 2011-08-24 (×3): 0.6 mg via ORAL
  Filled 2011-08-22 (×3): qty 1

## 2011-08-22 MED ORDER — POTASSIUM CHLORIDE CRYS ER 20 MEQ PO TBCR
60.0000 meq | EXTENDED_RELEASE_TABLET | Freq: Once | ORAL | Status: AC
  Administered 2011-08-22: 60 meq via ORAL
  Filled 2011-08-22: qty 3

## 2011-08-22 NOTE — Progress Notes (Signed)
DAILY PROGRESS NOTE                              [facility]  SUBJECTIVE: Denies fever or chills. Complaining about right wrist pain.  OBJECTIVE: BP 151/86  Pulse 79  Temp(Src) 97.5 F (36.4 C) (Oral)  Resp 20  Ht 5\' 9"  (1.753 m)  Wt 121.3 kg (267 lb 6.7 oz)  BMI 39.49 kg/m2  SpO2 99%  Intake/Output Summary (Last 24 hours) at 08/22/11 1516 Last data filed at 08/22/11 0956  Gross per 24 hour  Intake    950 ml  Output      0 ml  Net    950 ml                      Weight change:  Physical Exam: General: Alert and awake oriented x3 not in any acute distress. HEENT: anicteric sclera, pupils equal reactive to light and accommodation CVS: S1-S2 heard, no murmur rubs or gallops Chest: clear to auscultation bilaterally, no wheezing rales or rhonchi Abdomen:  normal bowel sounds, soft, nontender, nondistended, no organomegaly Neuro: Cranial nerves II-XII intact, no focal neurological deficits Extremities: no cyanosis, no clubbing or edema noted bilaterally   Lab Results:  Basename 08/22/11 0530 08/21/11 0539  NA 140 138  K 3.1* 4.0  CL 108 107  CO2 24 24  GLUCOSE 94 203*  BUN 10 12  CREATININE 1.15* 1.16*  CALCIUM 8.5 8.6  MG -- --  PHOS -- --    Basename 08/21/11 0539 08/20/11 0520  WBC 8.2 11.6*  NEUTROABS -- --  HGB 11.1* 12.2  HCT 35.5* 39.1  MCV 82.4 81.8  PLT 293 328   Micro Results: Recent Results (from the past 240 hour(s))  CULTURE, BLOOD (ROUTINE X 2)     Status: Normal (Preliminary result)   Collection Time   08/17/11  7:50 PM      Component Value Range Status Comment   Specimen Description Blood   Final    Special Requests NONE   Final    Culture  Setup Time [id number]   Final    Culture     Final    Value:        BLOOD CULTURE RECEIVED NO GROWTH TO DATE CULTURE WILL BE HELD FOR 5 DAYS BEFORE ISSUING A FINAL NEGATIVE REPORT   Report Status PENDING   Incomplete   CULTURE, BLOOD (ROUTINE X 2)     Status: Normal  (Preliminary result)   Collection Time   08/17/11  7:50 PM      Component Value Range Status Comment   Specimen Description Blood   Final    Special Requests NONE   Final    Culture  Setup Time [id number]   Final    Culture     Final    Value:        BLOOD CULTURE RECEIVED NO GROWTH TO DATE CULTURE WILL BE HELD FOR 5 DAYS BEFORE ISSUING A FINAL NEGATIVE REPORT   Report Status PENDING   Incomplete   URINE CULTURE     Status: Normal   Collection Time   08/19/11  9:30 AM      Component Value Range Status Comment   Specimen Description URINE, RANDOM   Final    Special Requests NONE   Final    Culture  Setup Time [id number]   Final    Colony  Count 9,000 COLONIES/ML   Final    Culture INSIGNIFICANT GROWTH   Final    Report Status 08/20/2011 FINAL   Final   WOUND CULTURE     Status: Normal   Collection Time   08/19/11  9:42 AM      Component Value Range Status Comment   Specimen Description BREAST   Final    Special Requests NONE   Final    Gram Stain     Final    Value: NO WBC SEEN     NO SQUAMOUS EPITHELIAL CELLS SEEN     NO ORGANISMS SEEN   Culture     Final    Value: FEW STAPHYLOCOCCUS AUREUS     Note: RIFAMPIN AND GENTAMICIN SHOULD NOT BE USED AS SINGLE DRUGS FOR TREATMENT OF STAPH INFECTIONS.   Report Status 08/21/2011 FINAL   Final    Organism ID, Bacteria STAPHYLOCOCCUS AUREUS   Final   WOUND CULTURE     Status: Normal   Collection Time   08/19/11  9:56 AM      Component Value Range Status Comment   Specimen Description WOUND BUTTOCKS   Final    Special Requests Normal   Final    Gram Stain     Final    Value: FEW WBC PRESENT,BOTH PMN AND MONONUCLEAR     NO SQUAMOUS EPITHELIAL CELLS SEEN     NO ORGANISMS SEEN   Culture     Final    Value: FEW METHICILLIN RESISTANT STAPHYLOCOCCUS AUREUS     Note: RIFAMPIN AND GENTAMICIN SHOULD NOT BE USED AS SINGLE DRUGS FOR TREATMENT OF STAPH INFECTIONS. This organism DOES NOT demonstrate inducible Clindamycin resistance in vitro.  CRITICAL RESULT CALLED TO, READ BACK BY AND VERIFIED WITH: JACQUELINE      THIGPEN 08/21/11 0840 BY SMITHERSJ   Report Status 08/21/2011 FINAL   Final    Organism ID, Bacteria METHICILLIN RESISTANT STAPHYLOCOCCUS AUREUS   Final   MRSA PCR SCREENING     Status: Normal   Collection Time   08/20/11  4:28 PM      Component Value Range Status Comment   MRSA by PCR NEGATIVE  NEGATIVE  Final     Studies/Results: Dg Chest 2 View  08/17/2011  *RADIOLOGY REPORT*  Clinical Data: Hyperglycemia, hypertension  CHEST - 2 VIEW  Comparison: 03/29/2011  Findings: Cardiomediastinal silhouette is stable.  No acute infiltrate or pleural effusion.  No pulmonary edema.  Bony thorax is stable.  IMPRESSION: . No active disease.  Original Report Authenticated By: Lahoma Crocker, M.D.   Medications: Scheduled Meds:    . clotrimazole   Topical BID  . insulin aspart  0-20 Units Subcutaneous TID WC  . insulin aspart  0-5 Units Subcutaneous QHS  . insulin aspart  6 Units Subcutaneous TID WC  . insulin glargine  25 Units Subcutaneous BID  . mulitivitamin with minerals  1 tablet Oral Daily  . pantoprazole  40 mg Oral Daily  . piperacillin-tazobactam (ZOSYN)  IV  3.375 g Intravenous Q8H  . polyethylene glycol  17 g Oral Daily  . potassium chloride  60 mEq Oral Once  . sodium chloride  10 mL Intravenous Q12H  . vancomycin  1,250 mg Intravenous Q12H  . warfarin  7.5 mg Oral ONCE-1800  . warfarin  7.5 mg Oral ONCE-1800  . Warfarin - Pharmacist Dosing Inpatient   Does not apply q1800  . DISCONTD: vancomycin  1,750 mg Intravenous Q24H   Continuous Infusions:  PRN Meds:.acetaminophen,  acetaminophen, HYDROmorphone, ondansetron (ZOFRAN) IV, ondansetron  ASSESSMENT & PLAN: Principal Problem:  *DM (diabetes mellitus) type II uncontrolled with renal manifestation Active Problems:  Dehydration  Acute kidney injury  CKD (chronic kidney disease)  History of atrial fibrillation  Furunculosis of multiple sites  Rash,  skin  Furunculosis -Involving left breast, right buttock, status post I&D in the emergency department on 08/17/2011. -General surgery is following, patient has recurrent multiple skin infections/abscesses. -Patient is on vancomycin and Zosyn.  Right wrist pain -This looks like gouty arthritis, very unlikely to have any septic arthritis with vancomycin and Zosyn on board. -I will start colchicine and low dose of prednisone.  Diabetes mellitus type 2 -Uncontrolled with hemoglobin A1c of 15.1 -Insulin adjusted, I will increase the insulin dose to 25 units twice a day.  Hyperglycemia -At the time of admission her blood glucose level was 526. -There was no acidosis or hyperosmolar hyperglycemia. -Her insulin was adjusted. Patient hydrated aggressively. With IV fluids.  Acute renal insufficiency on CKD stage III -This is likely secondary to dehydration, from the osmotic diuresis secondary to hyperglycemia. -Resolved now, patient back to her baseline. Creatinine is 1.1.  History of atrial fibrillation -Patient has history of Tc cardioversion she remains in sinus rhythm now. -She is on chronic anticoagulation managed by pharmacy.  Cardiomyopathy -LVEF of 23% and January 2011, clinically now no evidence of decompensation.    LOS: 5 days   Damarkus Balis A 08/22/2011, 3:16 PM

## 2011-08-22 NOTE — Progress Notes (Signed)
ANTICOAGULATION CONSULT NOTE - Follow Up Consult  Pharmacy Consult for Coumadin Indication: atrial fibrillation  Allergies  Allergen Reactions  . Codeine Hives and Nausea And Vomiting    Patient Measurements: Height: 5\' 9"  (175.3 cm) Weight: 267 lb 6.7 oz (121.3 kg) IBW/kg (Calculated) : 66.2   Vital Signs: Temp: 97.5 F (36.4 C) (03/21 1350) Temp src: Oral (03/21 1350) BP: 151/86 mmHg (03/21 1350) Pulse Rate: 79  (03/21 1350)  Labs:  Basename 08/22/11 0530 08/21/11 0539 08/20/11 0520  HGB -- 11.1* 12.2  HCT -- 35.5* 39.1  PLT -- 293 328  APTT -- -- --  LABPROT 20.8* 18.9* 18.9*  INR 1.76* 1.55* 1.55*  HEPARINUNFRC -- -- --  CREATININE 1.15* 1.16* 1.33*  CKTOTAL -- -- --  CKMB -- -- --  TROPONINI -- -- --   Estimated Creatinine Clearance: 67 ml/min (by C-G formula based on Cr of 1.15).   Medications:  Scheduled:     . clotrimazole   Topical BID  . insulin aspart  0-20 Units Subcutaneous TID WC  . insulin aspart  0-5 Units Subcutaneous QHS  . insulin aspart  6 Units Subcutaneous TID WC  . insulin glargine  25 Units Subcutaneous BID  . mulitivitamin with minerals  1 tablet Oral Daily  . pantoprazole  40 mg Oral Daily  . piperacillin-tazobactam (ZOSYN)  IV  3.375 g Intravenous Q8H  . polyethylene glycol  17 g Oral Daily  . sodium chloride  10 mL Intravenous Q12H  . vancomycin  1,250 mg Intravenous Q12H  . warfarin  7.5 mg Oral ONCE-1800  . Warfarin - Pharmacist Dosing Inpatient   Does not apply q1800  . DISCONTD: vancomycin  1,750 mg Intravenous Q24H    Assessment:  65yo F on chronic Coumadin for A.fib.  Dose PTA = 5 mg on M/F and 7.5 mg on all other days  Missed 3/16 dose  No bleeding reported  INR moving toward goal, will repeat dose.   Will monitor surgery recommendations for possible I&D of abscesses below right axillary/breast area.   Goal of Therapy:  INR 2-3   Plan:   Warfarin 7.5 mg PO x 1 tonight  Follow up AM INR  Arrington Yohe,  [name], PharmD 08/22/2011,2:48 PM

## 2011-08-22 NOTE — Progress Notes (Signed)
Patient ID: [name], female   DOB: February 08, 1946, 66 y.o.   MRN: [id number]    Subjective: Abscess below right axillary area/ right lateral breast area is less painful and seems to be responding well to the antibx and warm compresses.    Objective: Vital signs in last 24 hours: Temp:  [98 F (36.7 C)-98.6 F (37 C)] 98 F (36.7 C) (03/21 0545) Pulse Rate:  [73-79] 73  (03/21 0545) Resp:  [18] 18  (03/21 0545) BP: (120-172)/(66-84) 149/74 mmHg (03/21 0545) SpO2:  [96 %-98 %] 98 % (03/21 0545) Last BM Date: 08/20/11 (x 2)  Intake/Output from previous day: 03/20 0701 - 03/21 0700 In: 1480 [P.O.:1080; IV Piggyback:400] Out: -  Intake/Output this shift: Total I/O In: 240 [P.O.:240] Out: -   General appearance: alert, cooperative and no distress Skin: Skin color, texture, turgor normal.right lateral chest/breast abscess is softer, decreased tenderness per pt and feel like it may have started to spontaneously drain some.   Lab Results:   [facility] 08/21/11 0539 08/20/11 0520  WBC 8.2 11.6*  HGB 11.1* 12.2  HCT 35.5* 39.1  PLT 293 328    BMET  Basename 08/22/11 0530 08/21/11 0539  NA 140 138  K 3.1* 4.0  CL 108 107  CO2 24 24  GLUCOSE 94 203*  BUN 10 12  CREATININE 1.15* 1.16*  CALCIUM 8.5 8.6   PT/INR  Basename 08/22/11 0530 08/21/11 0539  LABPROT 20.8* 18.9*  INR 1.76* 1.55*     Lab 08/18/11 0710  AST 14  ALT 14  ALKPHOS 118*  BILITOT 0.3  PROT 6.6  ALBUMIN 2.5*     Lipase     Component Value Date/Time   LIPASE 19 06/26/2009 1910     Studies/Results: No results found.  Medications:    . clotrimazole   Topical BID  . insulin aspart  0-20 Units Subcutaneous TID WC  . insulin aspart  0-5 Units Subcutaneous QHS  . insulin aspart  6 Units Subcutaneous TID WC  . insulin glargine  25 Units Subcutaneous BID  . mulitivitamin with minerals  1 tablet Oral Daily  . pantoprazole  40 mg Oral Daily  . piperacillin-tazobactam (ZOSYN)  IV  3.375 g  Intravenous Q8H  . polyethylene glycol  17 g Oral Daily  . sodium chloride  10 mL Intravenous Q12H  . vancomycin  1,250 mg Intravenous Q12H  . warfarin  7.5 mg Oral ONCE-1800  . Warfarin - Pharmacist Dosing Inpatient   Does not apply q1800  . DISCONTD: insulin glargine  20 Units Subcutaneous BID  . DISCONTD: vancomycin  1,750 mg Intravenous Q24H    Assessment/Plan 1. Multiple recurrent skin abscesses  2. Diabetes mellitus with poor control HBA1C 15.1 3. Chronic atrial fibrillation on Coumadin  4. Cardiomyopathy with an EF of 20-25%  5. Hypertension  6. Chronic renal insufficiency  7. Hypertension  8. Ongoing tobacco use; history of emphysema  9. BMI of 39.6  10. Dyslipidemia   Plan:  Kpad, and warm soaks to axilla,  Continue antibiotics and glucose control.She seems to be improving and will monitor for need for I&D   LOS: 5 days    Pius Byrom,PA-C Pager [phone] General Trauma Pager [phone]

## 2011-08-23 LAB — GLUCOSE, CAPILLARY
Glucose-Capillary: 233 mg/dL — ABNORMAL HIGH (ref 70–99)
Glucose-Capillary: 164 mg/dL — ABNORMAL HIGH (ref 70–99)

## 2011-08-23 LAB — URIC ACID: Uric Acid, Serum: 2.9 mg/dL (ref 2.4–7.0)

## 2011-08-23 LAB — PROTIME-INR
INR: 1.84 — ABNORMAL HIGH (ref 0.00–1.49)
Prothrombin Time: 21.6 seconds — ABNORMAL HIGH (ref 11.6–15.2)

## 2011-08-23 MED ORDER — POTASSIUM CHLORIDE CRYS ER 20 MEQ PO TBCR
40.0000 meq | EXTENDED_RELEASE_TABLET | Freq: Once | ORAL | Status: AC
  Administered 2011-08-23: 40 meq via ORAL
  Filled 2011-08-23: qty 2

## 2011-08-23 MED ORDER — WARFARIN SODIUM 7.5 MG PO TABS
7.5000 mg | ORAL_TABLET | Freq: Once | ORAL | Status: AC
  Administered 2011-08-23: 7.5 mg via ORAL
  Filled 2011-08-23 (×2): qty 1

## 2011-08-23 MED ORDER — INSULIN GLARGINE 100 UNIT/ML ~~LOC~~ SOLN
35.0000 [IU] | Freq: Every day | SUBCUTANEOUS | Status: DC
  Administered 2011-08-23: 22:00:00 via SUBCUTANEOUS

## 2011-08-23 NOTE — Progress Notes (Signed)
DAILY PROGRESS NOTE                              [facility]AL [facility]  SUBJECTIVE: Denies fever or chills. Complaining about right wrist pain.  OBJECTIVE: BP 170/90  Pulse 89  Temp(Src) 98.2 F (36.8 C) (Oral)  Resp 19  Ht 5\' 9"  (1.753 m)  Wt 121.3 kg (267 lb 6.7 oz)  BMI 39.49 kg/m2  SpO2 95%  Intake/Output Summary (Last 24 hours) at 08/23/11 1205 Last data filed at 08/23/11 [id number]  Gross per 24 hour  Intake   1730 ml  Output      0 ml  Net   1730 ml                      Weight change:  Physical Exam: General: Alert and awake oriented x3 not in any acute distress. HEENT: anicteric sclera, pupils equal reactive to light and accommodation CVS: S1-S2 heard, no murmur rubs or gallops Chest: clear to auscultation bilaterally, no wheezing rales or rhonchi Abdomen:  normal bowel sounds, soft, nontender, nondistended, no organomegaly Neuro: Cranial nerves II-XII intact, no focal neurological deficits Extremities: no cyanosis, no clubbing or edema noted bilaterally   Lab Results:  Basename 08/22/11 0530 08/21/11 0539  NA 140 138  K 3.1* 4.0  CL 108 107  CO2 24 24  GLUCOSE 94 203*  BUN 10 12  CREATININE 1.15* 1.16*  CALCIUM 8.5 8.6  MG -- --  PHOS -- --    Basename 08/21/11 0539  WBC 8.2  NEUTROABS --  HGB 11.1*  HCT 35.5*  MCV 82.4  PLT 293   Micro Results: Recent Results (from the past 240 hour(s))  CULTURE, BLOOD (ROUTINE X 2)     Status: Normal (Preliminary result)   Collection Time   08/17/11  7:50 PM      Component Value Range Status Comment   Specimen Description Blood   Final    Special Requests NONE   Final    Culture  Setup Time [id number]   Final    Culture     Final    Value:        BLOOD CULTURE RECEIVED NO GROWTH TO DATE CULTURE WILL BE HELD FOR 5 DAYS BEFORE ISSUING A FINAL NEGATIVE REPORT   Report Status PENDING   Incomplete   CULTURE, BLOOD (ROUTINE X 2)     Status: Normal (Preliminary result)   Collection Time   08/17/11  7:50 PM      Component Value Range Status Comment   Specimen Description Blood   Final    Special Requests NONE   Final    Culture  Setup Time [id number]   Final    Culture     Final    Value:        BLOOD CULTURE RECEIVED NO GROWTH TO DATE CULTURE WILL BE HELD FOR 5 DAYS BEFORE ISSUING A FINAL NEGATIVE REPORT   Report Status PENDING   Incomplete   URINE CULTURE     Status: Normal   Collection Time   08/19/11  9:30 AM      Component Value Range Status Comment   Specimen Description URINE, RANDOM   Final    Special Requests NONE   Final    Culture  Setup Time [id number]   Final    Colony Count 9,000 COLONIES/ML   Final    Culture INSIGNIFICANT  GROWTH   Final    Report Status 08/20/2011 FINAL   Final   WOUND CULTURE     Status: Normal   Collection Time   08/19/11  9:42 AM      Component Value Range Status Comment   Specimen Description BREAST   Final    Special Requests NONE   Final    Gram Stain     Final    Value: NO WBC SEEN     NO SQUAMOUS EPITHELIAL CELLS SEEN     NO ORGANISMS SEEN   Culture     Final    Value: FEW STAPHYLOCOCCUS AUREUS     Note: RIFAMPIN AND GENTAMICIN SHOULD NOT BE USED AS SINGLE DRUGS FOR TREATMENT OF STAPH INFECTIONS.   Report Status 08/21/2011 FINAL   Final    Organism ID, Bacteria STAPHYLOCOCCUS AUREUS   Final   WOUND CULTURE     Status: Normal   Collection Time   08/19/11  9:56 AM      Component Value Range Status Comment   Specimen Description WOUND BUTTOCKS   Final    Special Requests Normal   Final    Gram Stain     Final    Value: FEW WBC PRESENT,BOTH PMN AND MONONUCLEAR     NO SQUAMOUS EPITHELIAL CELLS SEEN     NO ORGANISMS SEEN   Culture     Final    Value: FEW METHICILLIN RESISTANT STAPHYLOCOCCUS AUREUS     Note: RIFAMPIN AND GENTAMICIN SHOULD NOT BE USED AS SINGLE DRUGS FOR TREATMENT OF STAPH INFECTIONS. This organism DOES NOT demonstrate inducible Clindamycin resistance in vitro. CRITICAL RESULT CALLED TO, READ BACK BY AND  VERIFIED WITH: JACQUELINE      THIGPEN 08/21/11 0840 BY SMITHERSJ   Report Status 08/21/2011 FINAL   Final    Organism ID, Bacteria METHICILLIN RESISTANT STAPHYLOCOCCUS AUREUS   Final   MRSA PCR SCREENING     Status: Normal   Collection Time   08/20/11  4:28 PM      Component Value Range Status Comment   MRSA by PCR NEGATIVE  NEGATIVE  Final     Studies/Results: Dg Chest 2 View  08/17/2011  *RADIOLOGY REPORT*  Clinical Data: Hyperglycemia, hypertension  CHEST - 2 VIEW  Comparison: 03/29/2011  Findings: Cardiomediastinal silhouette is stable.  No acute infiltrate or pleural effusion.  No pulmonary edema.  Bony thorax is stable.  IMPRESSION: . No active disease.  Original Report Authenticated By: Lahoma Crocker, M.D.   Medications: Scheduled Meds:    . clotrimazole   Topical BID  . colchicine  0.6 mg Oral Daily  . insulin aspart  0-20 Units Subcutaneous TID WC  . insulin aspart  0-5 Units Subcutaneous QHS  . insulin aspart  6 Units Subcutaneous TID WC  . insulin glargine  25 Units Subcutaneous BID  . mulitivitamin with minerals  1 tablet Oral Daily  . pantoprazole  40 mg Oral Daily  . piperacillin-tazobactam (ZOSYN)  IV  3.375 g Intravenous Q8H  . polyethylene glycol  17 g Oral Daily  . potassium chloride  60 mEq Oral Once  . predniSONE  40 mg Oral Q breakfast  . sodium chloride  10 mL Intravenous Q12H  . vancomycin  1,250 mg Intravenous Q12H  . warfarin  7.5 mg Oral ONCE-1800  . warfarin  7.5 mg Oral ONCE-1800  . Warfarin - Pharmacist Dosing Inpatient   Does not apply q1800  . DISCONTD: prednisoLONE  40 mg Oral Daily  Continuous Infusions:  PRN Meds:.acetaminophen, acetaminophen, HYDROmorphone, ondansetron (ZOFRAN) IV, ondansetron  ASSESSMENT & PLAN: Principal Problem:  *DM (diabetes mellitus) type II uncontrolled with renal manifestation Active Problems:  Dehydration  Acute kidney injury  CKD (chronic kidney disease)  History of atrial fibrillation  Furunculosis of multiple  sites  Rash, skin  Furunculosis -Involving left breast, right buttock, status post I&D in the emergency department on 08/17/2011. -General surgery is following, patient has recurrent multiple skin infections/abscesses. -Patient is on vancomycin and Zosyn.  Right wrist pain -This looks like gouty arthritis, very unlikely to have any septic arthritis with vancomycin and Zosyn on board. -Colchicine started, feels better one dose of prednisone was given L. discontinue that because of uncontrolled diabetes.  Diabetes mellitus type 2 -Uncontrolled with hemoglobin A1c of 15.1 -Insulin adjusted, I will switch to 40 units of Lantus each bedtime and    Hyperglycemia -At the time of admission her blood glucose level was 526. -There was no acidosis or hyperosmolar hyperglycemia. -Her insulin was adjusted. Patient hydrated aggressively. With IV fluids.  Acute renal insufficiency on CKD stage III -This is likely secondary to dehydration, from the osmotic diuresis secondary to hyperglycemia. -Resolved now, patient back to her baseline. Creatinine is 1.1.  History of atrial fibrillation -Patient has history of DC cardioversion she remains in sinus rhythm now. -She is on chronic anticoagulation managed by pharmacy.  Cardiomyopathy -LVEF of 23% and January 2011, clinically now no evidence of decompensation.   Disposition -Likely to be discharged home in the morning on oral clindamycin. -General surgery please advise, if you are okay with the discharge and if you have preference for the antibiotics.   LOS: 6 days   Shawnita Krizek A 08/23/2011, 12:05 PM

## 2011-08-23 NOTE — Progress Notes (Signed)
  Subjective: Feeling better afebrile Objective: Vital signs in last 24 hours: Temp:  [97.5 F (36.4 C)-98.6 F (37 C)] 98.2 F (36.8 C) (03/22 0541) Pulse Rate:  [77-89] 89  (03/22 0541) Resp:  [19-20] 19  (03/22 0541) BP: (151-170)/(83-90) 170/90 mmHg (03/22 0541) SpO2:  [95 %-99 %] 95 % (03/22 0541) Last BM Date: 08/21/11  Intake/Output from previous day: 03/21 0701 - 03/22 0700 In: 1370 [P.O.:720; IV Piggyback:650] Out: -  Intake/Output this shift: Total I/O In: 600 [P.O.:600] Out: -   General appearance: alert, cooperative and no distress Skin: Skin color, texture, turgor normal. No rashes or lesions or Area on Buttocks and L breast open and draining .  Right axilla draining, I opened it a little more with sissors.  no erythema, dressing was dry on initial exam  Lab Results:   [facility] 08/21/11 0539  WBC 8.2  HGB 11.1*  HCT 35.5*  PLT 293    BMET  Basename 08/22/11 0530 08/21/11 0539  NA 140 138  K 3.1* 4.0  CL 108 107  CO2 24 24  GLUCOSE 94 203*  BUN 10 12  CREATININE 1.15* 1.16*  CALCIUM 8.5 8.6   PT/INR  Basename 08/23/11 0515 08/22/11 0530  LABPROT 21.6* 20.8*  INR 1.84* 1.76*     Lab 08/18/11 0710  AST 14  ALT 14  ALKPHOS 118*  BILITOT 0.3  PROT 6.6  ALBUMIN 2.5*     Lipase     Component Value Date/Time   LIPASE 19 06/26/2009 1910     Studies/Results: No results found.  Medications:    . clotrimazole   Topical BID  . colchicine  0.6 mg Oral Daily  . insulin aspart  0-20 Units Subcutaneous TID WC  . insulin aspart  0-5 Units Subcutaneous QHS  . insulin aspart  6 Units Subcutaneous TID WC  . insulin glargine  35 Units Subcutaneous QHS  . mulitivitamin with minerals  1 tablet Oral Daily  . pantoprazole  40 mg Oral Daily  . piperacillin-tazobactam (ZOSYN)  IV  3.375 g Intravenous Q8H  . polyethylene glycol  17 g Oral Daily  . potassium chloride  60 mEq Oral Once  . sodium chloride  10 mL Intravenous Q12H  . vancomycin   1,250 mg Intravenous Q12H  . warfarin  7.5 mg Oral ONCE-1800  . warfarin  7.5 mg Oral ONCE-1800  . Warfarin - Pharmacist Dosing Inpatient   Does not apply q1800  . DISCONTD: insulin glargine  25 Units Subcutaneous BID  . DISCONTD: prednisoLONE  40 mg Oral Daily  . DISCONTD: predniSONE  40 mg Oral Q breakfast    Assessment/Plan 1. Multiple recurrent skin abscesses  2. Diabetes mellitus with poor control HBA1C 15.1  3. Chronic atrial fibrillation on Coumadin  4. Cardiomyopathy with an EF of 20-25%  5. Hypertension  6. Chronic renal insufficiency  7. Hypertension  8. Ongoing tobacco use; history of emphysema  9. BMI of 39.6  10. Dyslipidemia  Plan: Continue current RX.  If she needs surgery follow up she can see Dr. Hassell Done PRN.  She is to go home tomorrow.  Will leave information on AVS.     LOS: 6 days    [name] 08/23/2011

## 2011-08-23 NOTE — Progress Notes (Signed)
[id number] continues to be treatment for multiple wounds and requir iv abx

## 2011-08-23 NOTE — Progress Notes (Signed)
ANTICOAGULATION CONSULT NOTE - Follow Up Consult  Pharmacy Consult for Coumadin Indication: atrial fibrillation  Allergies  Allergen Reactions  . Codeine Hives and Nausea And Vomiting    Patient Measurements: Height: 5\' 9"  (175.3 cm) Weight: 267 lb 6.7 oz (121.3 kg) IBW/kg (Calculated) : 66.2   Vital Signs: Temp: 98.2 F (36.8 C) (03/22 0541) Temp src: Oral (03/22 0541) BP: 170/90 mmHg (03/22 0541) Pulse Rate: 89  (03/22 0541)  Labs:  Basename 08/23/11 0515 08/22/11 0530 08/21/11 0539  HGB -- -- 11.1*  HCT -- -- 35.5*  PLT -- -- 293  APTT -- -- --  LABPROT 21.6* 20.8* 18.9*  INR 1.84* 1.76* 1.55*  HEPARINUNFRC -- -- --  CREATININE -- 1.15* 1.16*  CKTOTAL -- -- --  CKMB -- -- --  TROPONINI -- -- --   Estimated Creatinine Clearance: 67 ml/min (by C-G formula based on Cr of 1.15).   Medications:  Scheduled:     . clotrimazole   Topical BID  . colchicine  0.6 mg Oral Daily  . insulin aspart  0-20 Units Subcutaneous TID WC  . insulin aspart  0-5 Units Subcutaneous QHS  . insulin aspart  6 Units Subcutaneous TID WC  . insulin glargine  25 Units Subcutaneous BID  . mulitivitamin with minerals  1 tablet Oral Daily  . pantoprazole  40 mg Oral Daily  . piperacillin-tazobactam (ZOSYN)  IV  3.375 g Intravenous Q8H  . polyethylene glycol  17 g Oral Daily  . potassium chloride  60 mEq Oral Once  . predniSONE  40 mg Oral Q breakfast  . sodium chloride  10 mL Intravenous Q12H  . vancomycin  1,250 mg Intravenous Q12H  . warfarin  7.5 mg Oral ONCE-1800  . Warfarin - Pharmacist Dosing Inpatient   Does not apply q1800  . DISCONTD: prednisoLONE  40 mg Oral Daily    Assessment:  66yo F on chronic Coumadin for A.fib.  Dose PTA = 5 mg on M/F and 7.5 mg on all other days  Missed 3/16 dose  No bleeding reported  INR moving toward goal, will repeat dose.   Will monitor surgery recommendations for possible I&D of abscesses below right axillary/breast area.   Goal of  Therapy:  INR 2-3   Plan:   Warfarin 7.5 mg PO x 1 tonight  Follow up AM INR  [name], [name], PharmD 08/23/2011,11:14 AM

## 2011-08-23 NOTE — Discharge Instructions (Signed)

## 2011-08-24 LAB — CULTURE, BLOOD (ROUTINE X 2)
Culture  Setup Time: 201303170204
Culture  Setup Time: 201303170205
Culture: NO GROWTH
Culture: NO GROWTH

## 2011-08-24 LAB — BASIC METABOLIC PANEL WITH GFR
BUN: 16 mg/dL (ref 6–23)
CO2: 24 meq/L (ref 19–32)
Calcium: 9.2 mg/dL (ref 8.4–10.5)
Chloride: 105 meq/L (ref 96–112)
Creatinine, Ser: 1.26 mg/dL — ABNORMAL HIGH (ref 0.50–1.10)
GFR calc Af Amer: 50 mL/min — ABNORMAL LOW
GFR calc non Af Amer: 43 mL/min — ABNORMAL LOW
Glucose, Bld: 86 mg/dL (ref 70–99)
Potassium: 3.8 meq/L (ref 3.5–5.1)
Sodium: 137 meq/L (ref 135–145)

## 2011-08-24 LAB — GLUCOSE, CAPILLARY
Glucose-Capillary: 195 mg/dL — ABNORMAL HIGH (ref 70–99)
Glucose-Capillary: 214 mg/dL — ABNORMAL HIGH (ref 70–99)
Glucose-Capillary: 72 mg/dL (ref 70–99)
Glucose-Capillary: 89 mg/dL (ref 70–99)

## 2011-08-24 LAB — PROTIME-INR
INR: 2.03 — ABNORMAL HIGH (ref 0.00–1.49)
Prothrombin Time: 23.3 seconds — ABNORMAL HIGH (ref 11.6–15.2)

## 2011-08-24 LAB — VANCOMYCIN, TROUGH: Vancomycin Tr: 23.5 ug/mL — ABNORMAL HIGH (ref 10.0–20.0)

## 2011-08-24 MED ORDER — WARFARIN SODIUM 7.5 MG PO TABS
7.5000 mg | ORAL_TABLET | Freq: Once | ORAL | Status: DC
  Filled 2011-08-24: qty 1

## 2011-08-24 MED ORDER — INSULIN GLARGINE 100 UNIT/ML ~~LOC~~ SOLN: 35.0000 [IU] | Freq: Every day | SUBCUTANEOUS | Status: DC

## 2011-08-24 MED ORDER — CLINDAMYCIN HCL 300 MG PO CAPS: 300.0000 mg | ORAL_CAPSULE | Freq: Four times a day (QID) | ORAL | Status: AC

## 2011-08-24 MED ORDER — SODIUM CHLORIDE 0.9 % IV SOLN
2000.0000 mg | INTRAVENOUS | Status: DC
  Filled 2011-08-24: qty 2000

## 2011-08-24 NOTE — Progress Notes (Signed)
ANTIBIOTIC CONSULT NOTE - FOLLOW UP  Pharmacy Consult for Vanc/Zosyn Indication: r/o sepsis  Allergies  Allergen Reactions  . Codeine Hives and Nausea And Vomiting    Patient Measurements: Height: 5\' 9"  (175.3 cm) Weight: 267 lb 6.7 oz (121.3 kg) IBW/kg (Calculated) : 66.2   Vital Signs: Temp: 97.9 F (36.6 C) (03/23 0553) Temp src: Oral (03/23 0553) BP: 144/73 mmHg (03/23 0553) Pulse Rate: 76  (03/23 0553) Intake/Output from previous day: 03/22 0701 - 03/23 0700 In: [id number] [P.O.:1080; IV Piggyback:650] Out: -  Intake/Output from this shift:    Labs:  Basename 08/24/11 0610 08/22/11 0530  WBC -- --  HGB -- --  PLT -- --  LABCREA -- --  CREATININE 1.26* 1.15*   Estimated Creatinine Clearance: 61.2 ml/min (by C-G formula based on Cr of 1.26).  Basename 08/24/11 0610 08/21/11 1745  VANCOTROUGH 23.5* 11.5  VANCOPEAK -- --  Jake Michaelis -- --  GENTTROUGH -- --  GENTPEAK -- --  GENTRANDOM -- --  TOBRATROUGH -- --  TOBRAPEAK -- --  TOBRARND -- --  AMIKACINPEAK -- --  AMIKACINTROU -- --  AMIKACIN -- --   Assessment:  67 YOF on D8 of Vanc and Zosyn for r/o sepsis.  MRSA in wound cx.  Afebrile.  SCr trended up. CrCl 61, 49N (obese).  Pt currently receiving Vanc 1250 mg IV q12h.  Vanc trough above target range.  Goal of Therapy:  Vancomycin trough level 15-20 mcg/ml  Plan:   Change Vanc to 2g IV q24h.  Cont Zosyn 3.375g IV Q8H infused over 4hrs.  F/u renal fxn, repeat trough at [facility].  [name] 08/24/2011,7:31 AM

## 2011-08-24 NOTE — Progress Notes (Signed)
ANTICOAGULATION CONSULT NOTE - Follow Up Consult  Pharmacy Consult for Coumadin Indication: atrial fibrillation  Allergies  Allergen Reactions  . Codeine Hives and Nausea And Vomiting   Patient Measurements: Height: 5\' 9"  (175.3 cm) Weight: 267 lb 6.7 oz (121.3 kg) IBW/kg (Calculated) : 66.2   Vital Signs: Temp: 97.9 F (36.6 C) (03/23 0553) Temp src: Oral (03/23 0553) BP: 144/73 mmHg (03/23 0553) Pulse Rate: 76  (03/23 0553)  Labs:  Flo Shanks 08/24/11 0610 08/23/11 0515 08/22/11 0530  HGB -- -- --  HCT -- -- --  PLT -- -- --  APTT -- -- --  LABPROT 23.3* 21.6* 20.8*  INR 2.03* 1.84* 1.76*  HEPARINUNFRC -- -- --  CREATININE 1.26* -- 1.15*  CKTOTAL -- -- --  CKMB -- -- --  TROPONINI -- -- --   Estimated Creatinine Clearance: 61.2 ml/min (by C-G formula based on Cr of 1.26).  Medications:  Scheduled:     . clotrimazole   Topical BID  . colchicine  0.6 mg Oral Daily  . insulin aspart  0-20 Units Subcutaneous TID WC  . insulin aspart  0-5 Units Subcutaneous QHS  . insulin aspart  6 Units Subcutaneous TID WC  . insulin glargine  35 Units Subcutaneous QHS  . mulitivitamin with minerals  1 tablet Oral Daily  . pantoprazole  40 mg Oral Daily  . piperacillin-tazobactam (ZOSYN)  IV  3.375 g Intravenous Q8H  . polyethylene glycol  17 g Oral Daily  . potassium chloride  40 mEq Oral Once  . sodium chloride  10 mL Intravenous Q12H  . vancomycin  2,000 mg Intravenous Q24H  . warfarin  7.5 mg Oral ONCE-1800  . Warfarin - Pharmacist Dosing Inpatient   Does not apply q1800  . DISCONTD: insulin glargine  25 Units Subcutaneous BID  . DISCONTD: predniSONE  40 mg Oral Q breakfast  . DISCONTD: vancomycin  1,250 mg Intravenous Q12H   Assessment:  65yo F on chronic Coumadin for A.fib.  Dose PTA = 5 mg on M/F and 7.5 mg on all other days  No bleeding reported  INR in goal range now.   MD mentioned possibly home today.   Goal of Therapy:  INR 2-3   Plan:   Warfarin  7.5 mg PO x 1 tonight  Follow up AM INR  [name], PharmD 08/24/2011,7:44 AM

## 2011-08-24 NOTE — Discharge Summary (Addendum)
[place]  MRN: [id number]  DOB:[birth year]  Date of Admission: 08/17/2011 Date of Discharge: 08/27/2011         LOS: 7 days   Attending Physician:  Verlee Monte A  Patient's PCP:  Maximino Greenland, MD, MD  Consults: General surgery  Discharge Diagnosis: .Furunculosis of multiple sites  Present on Admission:  .DM (diabetes mellitus) type II uncontrolled with renal manifestation .Dehydration .Acute kidney injury .CKD (chronic kidney disease) .Furunculosis of multiple sites   Medication List  As of 08/27/2011  8:21 PM   STOP taking these medications         VICTOZA 18 MG/3ML Soln         TAKE these medications         carvedilol 12.5 MG tablet   Commonly known as: COREG   Take 1 tablet (12.5 mg total) by mouth 2 (two) times daily.      clindamycin 300 MG capsule   Commonly known as: CLEOCIN   Take 1 capsule (300 mg total) by mouth 4 (four) times daily.      enalapril 10 MG tablet   Commonly known as: VASOTEC   Take 1 tablet (10 mg total) by mouth 2 (two) times daily.      furosemide 40 MG tablet   Commonly known as: LASIX   Take 40 mg by mouth daily as needed. Fluid retention      insulin aspart 100 UNIT/ML injection   Commonly known as: novoLOG   Inject 15 Units into the skin at bedtime.      insulin glargine 100 UNIT/ML injection   Commonly known as: LANTUS   Inject 35 Units into the skin at bedtime.      lansoprazole 15 MG capsule   Commonly known as: PREVACID   Take 15 mg by mouth daily.      mulitivitamin with minerals Tabs   Take 1 tablet by mouth daily.      potassium chloride SA 20 MEQ tablet   Commonly known as: K-DUR,KLOR-CON   Take 20 mEq by mouth daily.      warfarin 5 MG tablet   Commonly known as: COUMADIN   Take 5-7.5 mg by mouth See admin instructions. Takes 1 tablet on fridays and Monday for 5mg  dosage....takes1.5 tablet every other day for 7.5mg  dosage               Brief Admission  History: This is a 66 year old female, with known history of Atrial fibrillation, s/p remote RFA ablation, s/p DCCV 08/24/11, chronic anticoagulation, Insulin requiring DM-2, HTN, dyslipidemia, CHF 06/1999, EF 20%-25% per 2 D Echo of 06/27/09, COPD, depression, GERD, obesity, CKD, baseline creat 1.07-1.42 06/2009, left thyroid nodule, s/p left eye cataract surgery 06/13/10, s/p tonsillectomy, s/p hysterectomy, s/p cholecystectomy, s/p right breast biopsy,presenting to the ED via referral from her PMD's office. According to patient, she had been feeling unwell for about 3 weeks, with brief URI symptoms, which have since subsided, but after which she became progressively weak. Her home CBGs, have been quite high, in the past week, despite compliance with her medications. She was seen at her PMD's office today on a scheduled visit, and , was found to be orthostatic, with BP of 50/30, per patient, CBGs were in the 500s, and she was sent to ED, via EMS. According to patient, she has had chills and temperature up to 101-102, in the past couple of weeks.  Hospital Course: Present on Admission:  .DM (diabetes  mellitus) type II uncontrolled with renal manifestation .Dehydration .Acute kidney injury .CKD (chronic kidney disease) .Furunculosis of multiple sites  1. Multiple skin abscesses: Furunculosis involving left breast and right buttock and other small abscesses in her torso. Status post I&D by general surgery. As mentioned above patient initially came into the hospital because of high blood sugar and feeling unwell and some fever. This is might be all secondary to her skin infection. Patient started on Zosyn and vancomycin as she is diabetic. General surgery was seeing the patient and they recommended (after the I&D one of the abscesses) to continue antibiotics. Patient had 5 days of IV vancomycin and Zosyn. At the time of discharge patient discharged on clindamycin for 7 more days.  2. Uncontrolled diabetes  mellitus type 2: At the time of admission patient CBG is over 500. Patient was not acidotic so she does not have DKA. Patient also does not have hyperosmolar hyperglycemic state. Her hemoglobin A1c is 15.1 indicating uncontrolled diabetes mellitus. Patient is on Victoza and that was discontinued and Lantus insulin started. At the time of discharge patient discharged on 35 units of Lantus and she was asked to check her fasting blood glucose every day till she sees Dr. Baird Cancer in one week, to further adjust the nighttime Lantus insulin.  3. Dehydration: At the time of admission patient creatinine was 1.6 and she was dehydrated. Dehydration is likely secondary to osmotic diuresis from hyperglycemia. This is resolved with IV fluid hydration and controlling the blood sugar.  4. Acute renal insufficiency: Secondary to dehydration. Patient also was on ACE inhibitor which was held at the time of admission. At the time of discharge creatinine is 1.2. Her enalapril is restarted. Patient to followup with Dr. Baird Cancer.  5. Cardiomyopathy: She has LVEF of 23% on January 2011. While this hospitalization patient did not have any symptoms or signs suggesting decompensation. Her ACE inhibitor, Lasix were held at the time of admission secondary to dehydration and acute renal sufficiency but there were continued at the time of discharge.  6. Right wrist pain: While she is in the hospital patient developed right wrist pain, it is very suggestive of gouty arthritis. Patient started treatment on prednisone and colchicine, and the wrist pain much much better in 2 days. Because of the difficult to control diabetes the prednisone was discontinued. Then the patient mentioned her pain is much better so colchicine was felt not needed at the time of discharge. Uric acid level is 2.9.  Day of Discharge BP 144/73  Pulse 76  Temp(Src) 97.9 F (36.6 C) (Oral)  Resp 19  Ht 5\' 9"  (1.753 m)  Wt 121.3 kg (267 lb 6.7 oz)  BMI 39.49  kg/m2  SpO2 94% Physical Exam: GEN: No acute distress, cooperative with exam PSYCH: alert and oriented x4; does not appear anxious does not appear depressed; affect is normal  HEENT: Mucous membranes pink and anicteric;  Mouth: without oral thrush or lesions Eyes: PERRLA; EOM intact;  Neck: no cervical lymphadenopathy nor thyromegaly or carotid bruit; no JVD;  CHEST WALL: No tenderness, symmetrical to breathing bilaterally CHEST: Normal respiration, clear to auscultation bilaterally  HEART: Regular rate and rhythm; no murmurs, rubs or gallops, S1 and S2 heard  BACK: No kyphosis or scoliosis; no CVA tenderness  ABDOMEN:  soft non-tender; no masses, no organomegaly, normal abdominal bowel sounds; no pannus; no intertriginous candida.  EXTREMITIES: No bone or joint deformity; no edema; no ulcerations.  PULSES: 2+ and symmetric, neurovascularity is intact SKIN: Normal  hydration no rash or ulceration, no flushing or suspicious lesions  CNS: Cranial nerves 2-12 grossly intact no focal neurologic deficit, coordination is intact gait not tested    No results found for this or any previous visit (from the past 24 hour(s)).  Disposition: Home   Follow-up Appts: Discharge Orders    Future Appointments: Provider: Department: Dept Phone: Center:   10/02/2011 10:00 AM Carlena Bjornstad, [place] [phone] [id number]     Future Orders Please Complete By Expires   Diet Carb Modified      Increase activity slowly         Follow-up Information    Call Pedro Earls, MD. (As needed)    Contact information:   [facility], [place], [place] [phone]       Follow up with Maximino Greenland, MD in 1 week.   Contact information:   [address] [place] [phone]          I spent 40 minutes completing paperwork and coordinating discharge  efforts.  SignedVerlee Monte A 08/27/2011, 8:21 PM

## 2011-08-24 NOTE — Progress Notes (Signed)
DC instructions gone over with patient.  Verbalized understanding, Rx given.  Transported to front of hospital to be picked up by son.

## 2011-10-02 ENCOUNTER — Ambulatory Visit: Payer: Medicare Other | Admitting: Cardiology

## 2011-10-23 ENCOUNTER — Ambulatory Visit: Payer: Medicare Other | Admitting: Cardiology

## 2011-10-31 ENCOUNTER — Ambulatory Visit (INDEPENDENT_AMBULATORY_CARE_PROVIDER_SITE_OTHER): Payer: Medicare Other

## 2011-10-31 ENCOUNTER — Encounter: Payer: Self-pay | Admitting: Cardiology

## 2011-10-31 ENCOUNTER — Ambulatory Visit (INDEPENDENT_AMBULATORY_CARE_PROVIDER_SITE_OTHER): Payer: Medicare Other | Admitting: Cardiology

## 2011-10-31 DIAGNOSIS — L0292 Furuncle, unspecified: Secondary | ICD-10-CM

## 2011-10-31 DIAGNOSIS — I428 Other cardiomyopathies: Secondary | ICD-10-CM

## 2011-10-31 DIAGNOSIS — Z7901 Long term (current) use of anticoagulants: Secondary | ICD-10-CM

## 2011-10-31 DIAGNOSIS — I509 Heart failure, unspecified: Secondary | ICD-10-CM

## 2011-10-31 DIAGNOSIS — Z8679 Personal history of other diseases of the circulatory system: Secondary | ICD-10-CM

## 2011-10-31 DIAGNOSIS — E119 Type 2 diabetes mellitus without complications: Secondary | ICD-10-CM

## 2011-10-31 DIAGNOSIS — I4891 Unspecified atrial fibrillation: Secondary | ICD-10-CM

## 2011-10-31 LAB — POCT INR: INR: 1.3

## 2011-10-31 NOTE — Patient Instructions (Signed)
Your physician recommends that you schedule a follow-up appointment in: 8 weeks  Your physician has requested that you have an echocardiogram. Echocardiography is a painless test that uses sound waves to create images of your heart. It provides your doctor with information about the size and shape of your heart and how well your heart's Zelek and valves are working. This procedure takes approximately one hour. There are no restrictions for this procedure.

## 2011-10-31 NOTE — Assessment & Plan Note (Signed)
She was quite ill with this when in the hospital in March, 2013. Fortunately it is resolved.

## 2011-10-31 NOTE — Assessment & Plan Note (Signed)
Her diabetes had been controlled with hospitalization in March, 2013. Fortunately this has not been stabilize.

## 2011-10-31 NOTE — Assessment & Plan Note (Signed)
Clinically her volume status is stable. No change in therapy today.

## 2011-10-31 NOTE — Assessment & Plan Note (Signed)
Rhythm is regular today. The patient is on Coumadin. No change in therapy.

## 2011-10-31 NOTE — Progress Notes (Signed)
HPI  Patient is doing well today. I saw her last September, 2012. She has significant left ventricular dysfunction. I was trying to titrate her medications. Originally she had been scheduled for early followup. In the meantime she was hospitalized in March, 2013. She has significant difficulties with her diabetes. In addition she had multiple skin infections that were treated. She did not have any marked cardiac difficulties in the hospital. She is now feeling much better. She's not having any chest pain or shortness of breath. She has no edema.  Allergies  Allergen Reactions  . Codeine Hives and Nausea And Vomiting    Current Outpatient Prescriptions  Medication Sig Dispense Refill  . carvedilol (COREG) 12.5 MG tablet Take 1 tablet (12.5 mg total) by mouth 2 (two) times daily.  60 tablet  6  . enalapril (VASOTEC) 10 MG tablet Take 1 tablet (10 mg total) by mouth 2 (two) times daily.  60 tablet  3  . furosemide (LASIX) 40 MG tablet Take 40 mg by mouth daily as needed. Fluid retention      . insulin detemir (LEVEMIR) 100 UNIT/ML injection Inject into the skin as directed.      . lansoprazole (PREVACID) 15 MG capsule Take 15 mg by mouth daily.      . Liraglutide (VICTOZA) 18 MG/3ML SOLN Inject into the skin as directed.      . Multiple Vitamin (MULITIVITAMIN WITH MINERALS) TABS Take 1 tablet by mouth daily.      . potassium chloride SA (K-DUR,KLOR-CON) 20 MEQ tablet Take 20 mEq by mouth daily.       Marland Kitchen warfarin (COUMADIN) 5 MG tablet Take 5-7.5 mg by mouth See admin instructions. Takes 1 tablet on fridays and Monday for 5mg  dosage....takes1.5 tablet every other day for 7.5mg  dosage         History   Social History  . Marital Status: Divorced    Spouse Name: N/A    Number of Children: N/A  . Years of Education: N/A   Occupational History  . Not on file.   Social History Main Topics  . Smoking status: Current Everyday Smoker  . Smokeless tobacco: Not on file  . Alcohol Use: No  .  Drug Use: No  . Sexually Active: Not on file   Other Topics Concern  . Not on file   Social History Narrative  . No narrative on file    Family History  Problem Relation Age of Onset  . Colon cancer    . Heart disease      Past Medical History  Diagnosis Date  . Hyperkalemia   . Overweight   . CHF (congestive heart failure)   . HLD (hyperlipidemia)   . DM2 (diabetes mellitus, type 2)   . Depression   . History of bronchitis   . History of emphysema   . Atrial fibrillation     Cardioverted to NSR 08/22/2009  . CKD (chronic kidney disease)   . Mitral regurgitation     mild - echo - 1/11 /  Mild, echo, May15, 2012  . Tricuspid regurgitation     moderate - echo - 1/11  . Drug therapy 1/11    coumadin  . Thyroid dysfunction     thyromegally,diffuse,nodule LLL,06/2009  . Ejection fraction < 50%     20-25%, echo.06/2009, /  EF 30-35%, diffuse hypokinesis, echo, Oct 16, 2010  . LFT elevation     06/2009  . Cardiomyopathy     Past Surgical History  Procedure Date  . Cholecystectomy   . Hysterectomy unknow   . Tonsillectomy   . Breast biopsy     ROS  Patient denies fever, chills, headache, sweats, rash, change in vision, change in hearing, chest pain, cough, nausea vomiting, urinary symptoms. All other systems are reviewed and are negative.  PHYSICAL EXAM  Patient is stable. She is overweight. She is oriented to person time and place. Affect is normal. There is no jugulovenous distention. Lungs are clear. Respiratory effort is nonlabored. Cardiac exam reveals S1 and S2. There no clicks or significant murmurs. The abdomen is soft. There is no peripheral edema. There are no musculoskeletal deformities. There are no skin rashes.  Filed Vitals:   10/31/11 0914  BP: 108/60  Pulse: 70  Height: 5\' 9"  (1.753 m)  Weight: 266 lb (120.657 kg)     ASSESSMENT & PLAN

## 2011-10-31 NOTE — Assessment & Plan Note (Signed)
I hope to titrate her medicines further after her last visit. She is on a beta blocker and an ACE inhibitor. It is time now for followup 2-D echo. She's actually doing well. I am hopeful that she's had some improvement in LV function. If so I'll continue her current medications. If not we will have to look further into further adjustments of her medicines and consider whether she is or is not a candidate for device therapy.

## 2011-11-20 ENCOUNTER — Other Ambulatory Visit: Payer: Self-pay

## 2011-11-20 ENCOUNTER — Ambulatory Visit (HOSPITAL_COMMUNITY): Payer: Medicare Other | Attending: Cardiology

## 2011-11-20 ENCOUNTER — Other Ambulatory Visit (HOSPITAL_COMMUNITY): Payer: Medicare Other

## 2011-11-20 ENCOUNTER — Ambulatory Visit (INDEPENDENT_AMBULATORY_CARE_PROVIDER_SITE_OTHER): Payer: Medicare Other | Admitting: *Deleted

## 2011-11-20 DIAGNOSIS — Z8679 Personal history of other diseases of the circulatory system: Secondary | ICD-10-CM

## 2011-11-20 DIAGNOSIS — E669 Obesity, unspecified: Secondary | ICD-10-CM | POA: Insufficient documentation

## 2011-11-20 DIAGNOSIS — E119 Type 2 diabetes mellitus without complications: Secondary | ICD-10-CM | POA: Insufficient documentation

## 2011-11-20 DIAGNOSIS — I4891 Unspecified atrial fibrillation: Secondary | ICD-10-CM

## 2011-11-20 DIAGNOSIS — Z7901 Long term (current) use of anticoagulants: Secondary | ICD-10-CM

## 2011-11-20 DIAGNOSIS — E785 Hyperlipidemia, unspecified: Secondary | ICD-10-CM | POA: Insufficient documentation

## 2011-11-20 DIAGNOSIS — I079 Rheumatic tricuspid valve disease, unspecified: Secondary | ICD-10-CM | POA: Insufficient documentation

## 2011-11-20 DIAGNOSIS — I428 Other cardiomyopathies: Secondary | ICD-10-CM | POA: Insufficient documentation

## 2011-11-20 LAB — POCT INR: INR: 1.3

## 2011-11-20 NOTE — Progress Notes (Signed)
Echocardiogram performed.  

## 2011-11-21 ENCOUNTER — Encounter: Payer: Self-pay | Admitting: Cardiology

## 2011-11-22 ENCOUNTER — Telehealth: Payer: Self-pay | Admitting: Cardiology

## 2011-11-22 NOTE — Telephone Encounter (Signed)
New problem:  Patient calling to speak with nurse Debbie from yesterday.

## 2011-11-22 NOTE — Telephone Encounter (Signed)
Echo results given

## 2011-12-10 ENCOUNTER — Ambulatory Visit (INDEPENDENT_AMBULATORY_CARE_PROVIDER_SITE_OTHER): Payer: Medicare Other | Admitting: Pharmacist

## 2011-12-10 DIAGNOSIS — I4891 Unspecified atrial fibrillation: Secondary | ICD-10-CM

## 2011-12-10 DIAGNOSIS — Z7901 Long term (current) use of anticoagulants: Secondary | ICD-10-CM

## 2011-12-10 LAB — POCT INR: INR: 1.5

## 2011-12-10 MED ORDER — WARFARIN SODIUM 5 MG PO TABS: ORAL_TABLET | ORAL | Status: DC

## 2011-12-24 ENCOUNTER — Ambulatory Visit (INDEPENDENT_AMBULATORY_CARE_PROVIDER_SITE_OTHER): Payer: Medicare Other | Admitting: Pharmacist

## 2011-12-24 DIAGNOSIS — Z7901 Long term (current) use of anticoagulants: Secondary | ICD-10-CM

## 2011-12-24 DIAGNOSIS — I4891 Unspecified atrial fibrillation: Secondary | ICD-10-CM

## 2011-12-24 LAB — POCT INR: INR: 1.7

## 2012-01-07 ENCOUNTER — Other Ambulatory Visit: Payer: Self-pay | Admitting: Internal Medicine

## 2012-01-07 ENCOUNTER — Ambulatory Visit (INDEPENDENT_AMBULATORY_CARE_PROVIDER_SITE_OTHER): Payer: Medicare Other | Admitting: *Deleted

## 2012-01-07 DIAGNOSIS — I4891 Unspecified atrial fibrillation: Secondary | ICD-10-CM

## 2012-01-07 DIAGNOSIS — Z7901 Long term (current) use of anticoagulants: Secondary | ICD-10-CM

## 2012-01-07 LAB — POCT INR: INR: 1.5

## 2012-01-24 ENCOUNTER — Ambulatory Visit: Payer: Medicare Other | Admitting: Cardiology

## 2012-01-27 ENCOUNTER — Ambulatory Visit: Payer: Medicare Other | Admitting: Cardiology

## 2012-01-27 ENCOUNTER — Ambulatory Visit (INDEPENDENT_AMBULATORY_CARE_PROVIDER_SITE_OTHER): Payer: Medicare Other | Admitting: *Deleted

## 2012-01-27 DIAGNOSIS — I4891 Unspecified atrial fibrillation: Secondary | ICD-10-CM

## 2012-01-27 DIAGNOSIS — Z7901 Long term (current) use of anticoagulants: Secondary | ICD-10-CM

## 2012-01-27 LAB — POCT INR: INR: 2

## 2012-02-11 ENCOUNTER — Other Ambulatory Visit: Payer: Self-pay | Admitting: Internal Medicine

## 2012-02-11 DIAGNOSIS — Z1231 Encounter for screening mammogram for malignant neoplasm of breast: Secondary | ICD-10-CM

## 2012-02-24 ENCOUNTER — Ambulatory Visit (INDEPENDENT_AMBULATORY_CARE_PROVIDER_SITE_OTHER): Payer: Medicare Other | Admitting: *Deleted

## 2012-02-24 DIAGNOSIS — Z7901 Long term (current) use of anticoagulants: Secondary | ICD-10-CM

## 2012-02-24 DIAGNOSIS — I4891 Unspecified atrial fibrillation: Secondary | ICD-10-CM

## 2012-02-24 LAB — POCT INR: INR: 2.3

## 2012-02-25 ENCOUNTER — Ambulatory Visit
Admission: RE | Admit: 2012-02-25 | Discharge: 2012-02-25 | Disposition: A | Discharge status: Home / Self Care | Payer: Medicare Other | Source: Ambulatory Visit | Attending: Internal Medicine | Admitting: Internal Medicine

## 2012-02-25 DIAGNOSIS — Z1231 Encounter for screening mammogram for malignant neoplasm of breast: Secondary | ICD-10-CM

## 2012-03-18 ENCOUNTER — Ambulatory Visit (INDEPENDENT_AMBULATORY_CARE_PROVIDER_SITE_OTHER): Payer: Medicare Other | Admitting: *Deleted

## 2012-03-18 ENCOUNTER — Ambulatory Visit (INDEPENDENT_AMBULATORY_CARE_PROVIDER_SITE_OTHER): Payer: Medicare Other | Admitting: Cardiology

## 2012-03-18 ENCOUNTER — Encounter: Payer: Self-pay | Admitting: Cardiology

## 2012-03-18 VITALS — BP 142/70 | HR 79 | Ht 69.0 in | Wt 265.1 lb

## 2012-03-18 DIAGNOSIS — Z7901 Long term (current) use of anticoagulants: Secondary | ICD-10-CM

## 2012-03-18 DIAGNOSIS — F17200 Nicotine dependence, unspecified, uncomplicated: Secondary | ICD-10-CM | POA: Insufficient documentation

## 2012-03-18 DIAGNOSIS — I4891 Unspecified atrial fibrillation: Secondary | ICD-10-CM

## 2012-03-18 DIAGNOSIS — Z720 Tobacco use: Secondary | ICD-10-CM | POA: Insufficient documentation

## 2012-03-18 DIAGNOSIS — R9430 Abnormal result of cardiovascular function study, unspecified: Secondary | ICD-10-CM

## 2012-03-18 DIAGNOSIS — I428 Other cardiomyopathies: Secondary | ICD-10-CM

## 2012-03-18 DIAGNOSIS — I429 Cardiomyopathy, unspecified: Secondary | ICD-10-CM

## 2012-03-18 DIAGNOSIS — R0989 Other specified symptoms and signs involving the circulatory and respiratory systems: Secondary | ICD-10-CM

## 2012-03-18 DIAGNOSIS — Z8679 Personal history of other diseases of the circulatory system: Secondary | ICD-10-CM

## 2012-03-18 DIAGNOSIS — I509 Heart failure, unspecified: Secondary | ICD-10-CM

## 2012-03-18 LAB — POCT INR: INR: 2.2

## 2012-03-18 NOTE — Patient Instructions (Addendum)
Your physician wants you to follow-up in: 1 year  You will receive a reminder letter in the mail two months in advance. If you don't receive a letter, please call our office to schedule the follow-up appointment.  Your physician recommends that you continue on your current medications as directed. Please refer to the Current Medication list given to you today.  

## 2012-03-18 NOTE — Assessment & Plan Note (Signed)
The patient has no signs of congestive heart failure at this time. LV function has improved. We will continue her medications.

## 2012-03-18 NOTE — Assessment & Plan Note (Signed)
Ejection fraction had normalized as of June, 2013. No reason to repeat echo at this time.

## 2012-03-18 NOTE — Assessment & Plan Note (Signed)
LV function improve. The patient continues on a beta blocker and an ACE inhibitor. No change in therapy.

## 2012-03-18 NOTE — Progress Notes (Signed)
Patient ID: [name], female   DOB: 15-Aug-1945, 66 y.o.   MRN: [id number]   HPI  Patient is seen to followup left ventricular dysfunction. At one point her ejection fraction was as low as 25%. She has been on appropriate medications and over time her LV continued to improve. Her echo in June, 2013 revealed an ejection fraction of 50-55%. She really is doing well. She is also doing well with her diabetes. She does continue to smoke approximately 10 cigarettes per day. I have counseled her to try to decrease and eventually stopped.  Allergies  Allergen Reactions  . Codeine Hives and Nausea And Vomiting    Current Outpatient Prescriptions  Medication Sig Dispense Refill  . carvedilol (COREG) 12.5 MG tablet Take 1 tablet (12.5 mg total) by mouth 2 (two) times daily.  60 tablet  6  . enalapril (VASOTEC) 10 MG tablet Take 1 tablet (10 mg total) by mouth 2 (two) times daily.  60 tablet  3  . furosemide (LASIX) 40 MG tablet Take 40 mg by mouth daily as needed. Fluid retention      . insulin detemir (LEVEMIR) 100 UNIT/ML injection Inject into the skin as directed.      . lansoprazole (PREVACID) 15 MG capsule Take 15 mg by mouth daily.      . Liraglutide (VICTOZA) 18 MG/3ML SOLN Inject into the skin as directed.      . Multiple Vitamin (MULITIVITAMIN WITH MINERALS) TABS Take 1 tablet by mouth daily.      . potassium chloride SA (K-DUR,KLOR-CON) 20 MEQ tablet Take 20 mEq by mouth daily.       Marland Kitchen warfarin (COUMADIN) 5 MG tablet Take as directed by the Anticoagulation Clinic.  55 tablet  3    History   Social History  . Marital Status: Divorced    Spouse Name: N/A    Number of Children: N/A  . Years of Education: N/A   Occupational History  . Not on file.   Social History Main Topics  . Smoking status: Current Every Day Smoker  . Smokeless tobacco: Not on file  . Alcohol Use: No  . Drug Use: No  . Sexually Active: Not on file   Other Topics Concern  . Not on file   Social History  Narrative  . No narrative on file    Family History  Problem Relation Age of Onset  . Colon cancer    . Heart disease      Past Medical History  Diagnosis Date  . Hyperkalemia   . Overweight   . CHF (congestive heart failure)   . HLD (hyperlipidemia)   . DM2 (diabetes mellitus, type 2)   . Depression   . History of bronchitis   . History of emphysema   . Atrial fibrillation     Cardioverted to NSR 08/22/2009  . CKD (chronic kidney disease)   . Mitral regurgitation     mild - echo - 1/11 /  Mild, echo, May15, 2012  . Tricuspid regurgitation     moderate - echo - 1/11  . Drug therapy 1/11    coumadin  . Thyroid dysfunction     thyromegally,diffuse,nodule LLL,06/2009  . Ejection fraction < 50%     20-25%, echo.06/2009, /  EF 30-35%, diffuse hypokinesis, echo, Oct 16, 2010  . LFT elevation     06/2009  . Cardiomyopathy     Past Surgical History  Procedure Date  . Cholecystectomy   . Hysterectomy unknow   .  Tonsillectomy   . Breast biopsy     Patient Active Problem List  Diagnosis  . DIABETES MELLITUS, TYPE II  . HYPERLIPIDEMIA  . HYPERKALEMIA  . OVERWEIGHT/OBESITY  . DEPRESSION  . CONGESTIVE HEART FAILURE, UNSPECIFIED  . BRONCHITIS  . EMPHYSEMA  . GERD  . BREAST BIOPSY, HX OF  . Long term current use of anticoagulant  . Atrial fibrillation  . Tricuspid regurgitation  . Thyroid dysfunction  . LFT elevation  . Ejection fraction < 50%  . Mitral regurgitation  . Cardiomyopathy  . DM (diabetes mellitus) type II uncontrolled with renal manifestation  . Dehydration  . Acute kidney injury  . CKD (chronic kidney disease)  . History of atrial fibrillation  . Furunculosis of multiple sites  . Rash, skin    ROS   Patient denies fever, chills, headache, sweats, rash, change in vision, change in hearing, chest pain, cough, nausea vomiting, urinary symptoms. All other systems are reviewed and are negative.  PHYSICAL EXAM  Patient is oriented to person time and  place. Affect is normal. Lungs are clear. Respiratory effort is nonlabored. Cardiac exam reveals S1 and S2. There are no clicks or significant murmurs. The patient is overweight. The abdomen is soft. There is no peripheral edema.  Filed Vitals:   03/18/12 0947  BP: 142/70  Pulse: 79  Height: 5\' 9"  (1.753 m)  Weight: 265 lb 1.9 oz (120.258 kg)  SpO2: 92%   EKG is done today and reviewed by me. She has low voltage. This has been noted before. There is sinus rhythm. There is no significant change from the past.  ASSESSMENT & PLAN

## 2012-03-18 NOTE — Assessment & Plan Note (Signed)
Patient continues to smoke 10 cigarettes per day. I've counseled her to stop.

## 2012-03-18 NOTE — Assessment & Plan Note (Signed)
There is history of atrial fibrillation. She is holding sinus rhythm. She is on Coumadin. No change in therapy.

## 2012-03-21 ENCOUNTER — Other Ambulatory Visit: Payer: Self-pay | Admitting: Cardiology

## 2012-04-01 ENCOUNTER — Other Ambulatory Visit: Payer: Self-pay | Admitting: Cardiology

## 2012-06-18 ENCOUNTER — Ambulatory Visit (INDEPENDENT_AMBULATORY_CARE_PROVIDER_SITE_OTHER): Payer: Medicare Other | Admitting: *Deleted

## 2012-06-18 DIAGNOSIS — Z7901 Long term (current) use of anticoagulants: Secondary | ICD-10-CM

## 2012-06-18 DIAGNOSIS — I4891 Unspecified atrial fibrillation: Secondary | ICD-10-CM

## 2012-06-18 LAB — POCT INR: INR: 2.2

## 2012-06-18 MED ORDER — WARFARIN SODIUM 5 MG PO TABS: ORAL_TABLET | ORAL | Status: DC

## 2012-07-30 ENCOUNTER — Other Ambulatory Visit: Payer: Self-pay

## 2012-07-31 MED ORDER — WARFARIN SODIUM 5 MG PO TABS: ORAL_TABLET | ORAL | Status: DC

## 2012-08-06 ENCOUNTER — Ambulatory Visit (INDEPENDENT_AMBULATORY_CARE_PROVIDER_SITE_OTHER): Payer: Medicare Other

## 2012-08-06 DIAGNOSIS — I4891 Unspecified atrial fibrillation: Secondary | ICD-10-CM

## 2012-08-06 DIAGNOSIS — Z7901 Long term (current) use of anticoagulants: Secondary | ICD-10-CM

## 2012-08-06 LAB — POCT INR: INR: 2.9

## 2012-08-19 ENCOUNTER — Other Ambulatory Visit: Payer: Self-pay | Admitting: *Deleted

## 2012-08-19 MED ORDER — ENALAPRIL MALEATE 10 MG PO TABS: 10.0000 mg | ORAL_TABLET | Freq: Two times a day (BID) | ORAL | Status: DC

## 2012-11-27 ENCOUNTER — Other Ambulatory Visit: Payer: Self-pay

## 2012-11-27 NOTE — Telephone Encounter (Signed)
Approved      Disp Refills Start End    enalapril (VASOTEC) 10 MG tablet 60 tablet 7 08/19/2012      Sig - Route:  Take 1 tablet (10 mg total) by mouth 2 (two) times daily. - Oral    Class:  Normal    Authorizing Provider:  Carlena Bjornstad, MD    Ordering User:  Marlis Edelson

## 2012-12-25 NOTE — Telephone Encounter (Signed)
Left message for pt to return call as has not been seen since 08/06/2012.

## 2013-02-27 ENCOUNTER — Other Ambulatory Visit: Payer: Self-pay | Admitting: Cardiology

## 2013-03-05 ENCOUNTER — Encounter (HOSPITAL_COMMUNITY): Payer: Self-pay | Admitting: Pharmacy Technician

## 2013-03-06 ENCOUNTER — Other Ambulatory Visit: Payer: Self-pay | Admitting: Cardiology

## 2013-03-08 ENCOUNTER — Telehealth: Payer: Self-pay | Admitting: Cardiology

## 2013-03-08 NOTE — Telephone Encounter (Signed)
Pt states she is scheduled for TKR on 03/12/13 by Dr Sharol Given [facility] FYI for Dr Shonna Chock he is at [facility] during this time pt would like Dr Ron Parker to stick his head in the door and say hello.

## 2013-03-08 NOTE — Telephone Encounter (Signed)
New Problem:  Pt states she is having surgery 03/12/13 at 10:30 at [facility]. PT states she would like Dr. Ron Parker to visit her in the hospital. Pt would like a call back

## 2013-03-08 NOTE — Telephone Encounter (Signed)
Spoke with pt.  She states she has not been able to come to any appointments since March due to extreme knee pain.  She ran out of Coumadin on Friday.  She is scheduled for knee replacement on 10/10.  From what I can see, it does not look like pt has had cardiac clearance yet.  She states they did not say anything about holding Coumadin.  Luckily she is out of the medication so her INR will be subtherapeutic by procedure date.  Will send note to Dr. Ron Parker for review and to determine if cardiac clearance is needed.

## 2013-03-08 NOTE — Telephone Encounter (Signed)
New Problem:  Pt states she is calling for [name] or [name] with questions about her coumadin. Please advise

## 2013-03-09 ENCOUNTER — Other Ambulatory Visit (HOSPITAL_COMMUNITY): Payer: Self-pay | Admitting: Orthopedic Surgery

## 2013-03-10 ENCOUNTER — Encounter (HOSPITAL_COMMUNITY)
Admission: RE | Admit: 2013-03-10 | Discharge: 2013-03-10 | Disposition: A | Discharge status: Home / Self Care | Payer: Medicare Other | Source: Ambulatory Visit | Attending: Orthopedic Surgery | Admitting: Orthopedic Surgery

## 2013-03-10 ENCOUNTER — Encounter (HOSPITAL_COMMUNITY): Payer: Self-pay

## 2013-03-10 DIAGNOSIS — Z01810 Encounter for preprocedural cardiovascular examination: Secondary | ICD-10-CM | POA: Insufficient documentation

## 2013-03-10 DIAGNOSIS — Z01818 Encounter for other preprocedural examination: Secondary | ICD-10-CM | POA: Insufficient documentation

## 2013-03-10 DIAGNOSIS — Z01812 Encounter for preprocedural laboratory examination: Secondary | ICD-10-CM | POA: Insufficient documentation

## 2013-03-10 HISTORY — DX: Gout, unspecified: M10.9

## 2013-03-10 HISTORY — DX: Gastro-esophageal reflux disease without esophagitis: K21.9

## 2013-03-10 HISTORY — DX: Essential (primary) hypertension: I10

## 2013-03-10 HISTORY — DX: Dizziness and giddiness: R42

## 2013-03-10 HISTORY — DX: Unspecified osteoarthritis, unspecified site: M19.90

## 2013-03-10 LAB — COMPREHENSIVE METABOLIC PANEL
ALT: 25 U/L (ref 0–35)
AST: 27 U/L (ref 0–37)
Albumin: 3.1 g/dL — ABNORMAL LOW (ref 3.5–5.2)
Alkaline Phosphatase: 145 U/L — ABNORMAL HIGH (ref 39–117)
BUN: 19 mg/dL (ref 6–23)
CO2: 25 mEq/L (ref 19–32)
Calcium: 9.3 mg/dL (ref 8.4–10.5)
Chloride: 100 mEq/L (ref 96–112)
Creatinine, Ser: 0.93 mg/dL (ref 0.50–1.10)
GFR calc Af Amer: 72 mL/min — ABNORMAL LOW (ref >90)
GFR calc non Af Amer: 62 mL/min — ABNORMAL LOW (ref >90)
Glucose, Bld: 79 mg/dL (ref 70–99)
Potassium: 3.7 mEq/L (ref 3.5–5.1)
Sodium: 138 mEq/L (ref 135–145)
Total Bilirubin: 0.4 mg/dL (ref 0.3–1.2)
Total Protein: 7.8 g/dL (ref 6.0–8.3)

## 2013-03-10 LAB — CBC
HCT: 30.3 % — ABNORMAL LOW (ref 36.0–46.0)
Hemoglobin: 9.4 g/dL — ABNORMAL LOW (ref 12.0–15.0)
MCH: 23.3 pg — ABNORMAL LOW (ref 26.0–34.0)
MCHC: 31 g/dL (ref 30.0–36.0)
MCV: 75 fL — ABNORMAL LOW (ref 78.0–100.0)
Platelets: 483 10*3/uL — ABNORMAL HIGH (ref 150–400)
RBC: 4.04 MIL/uL (ref 3.87–5.11)
RDW: 16 % — ABNORMAL HIGH (ref 11.5–15.5)
WBC: 10.1 10*3/uL (ref 4.0–10.5)

## 2013-03-10 LAB — URINALYSIS, ROUTINE W REFLEX MICROSCOPIC
Bilirubin Urine: NEGATIVE
Glucose, UA: NEGATIVE mg/dL
Hgb urine dipstick: NEGATIVE
Ketones, ur: NEGATIVE mg/dL
Leukocytes, UA: NEGATIVE
Nitrite: NEGATIVE
Protein, ur: NEGATIVE mg/dL
Specific Gravity, Urine: 1.017 (ref 1.005–1.030)
Urobilinogen, UA: 0.2 mg/dL (ref 0.0–1.0)
pH: 5.5 (ref 5.0–8.0)

## 2013-03-10 LAB — SURGICAL PCR SCREEN
MRSA, PCR: NEGATIVE
Staphylococcus aureus: POSITIVE — AB

## 2013-03-10 LAB — PROTIME-INR
INR: 1.69 — ABNORMAL HIGH (ref 0.00–1.49)
Prothrombin Time: 19.4 seconds — ABNORMAL HIGH (ref 11.6–15.2)

## 2013-03-10 LAB — ABO/RH: ABO/RH(D): B POS

## 2013-03-10 LAB — APTT: aPTT: 42 seconds — ABNORMAL HIGH (ref 24–37)

## 2013-03-10 NOTE — Pre-Procedure Instructions (Signed)
[name]  03/10/2013   Your procedure is scheduled on:  Friday, October 10th  Report to Main Entrance "A" at 1030 AM.  Call this number if you have problems the morning of surgery: [phone]   Remember:   Do not eat food or drink liquids after midnight.   Take these medicines the morning of surgery with A SIP OF WATER: coreg, prevacid, vicodin if needed Do NOT take any diabetes medication or insulin on morning of surgery.   Do not wear jewelry, make-up or nail polish.  Do not wear lotions, powders, or perfumes. You may wear deodorant.  Do not shave 48 hours prior to surgery. Men may shave face and neck.  Do not bring valuables to the hospital.  [facility] is not responsible for any belongings or valuables.               Contacts, dentures or bridgework may not be worn into surgery.  Leave suitcase in the car. After surgery it may be brought to your room.  For patients admitted to the hospital, discharge time is determined by your  treatment team.    Special Instructions: Shower using CHG 2 nights before surgery and the night before surgery.  If you shower the day of surgery use CHG.  Use special wash - you have one bottle of CHG for all showers.  You should use approximately 1/3 of the bottle for each shower.   Please read over the following fact sheets that you were given: Pain Booklet, Coughing and Deep Breathing, Blood Transfusion Information, MRSA Information and Surgical Site Infection Prevention

## 2013-03-10 NOTE — Progress Notes (Addendum)
Primary physician - Dr. Bryon Lions Cardiologist - dr. Lars Masson, ekg in epic from 2013 no other recent testing.  Stopped coumadin on Sunday night.  Patient was scheduled to see dr. Ron Parker Friday but this was cancelled because of surgery being scheduled for this day. Last note from [place] stated they were forwarding a note to Ron Parker to determine if cardiac clearance is needed prior to surgery. Call to [facility] PA who will review chart in AM.

## 2013-03-11 MED ORDER — CEFAZOLIN SODIUM-DEXTROSE 2-3 GM-% IV SOLR
2.0000 g | INTRAVENOUS | Status: AC
  Administered 2013-03-12: 2 g via INTRAVENOUS
  Filled 2013-03-11: qty 50

## 2013-03-11 NOTE — Progress Notes (Signed)
Anesthesia Chart Review:  Patient is a 67 year old female scheduled for left TKA on 03/12/13 by Dr. Sharol Given.  History includes smoking, obesity, DM2, HTN, HLD, afib s/p ablation '99 and cardioversion '11, cardiomyopathy (EF 20-25% in 06/2009 and up to 50-55% in 11/2011), CHF, emphysema, CKD, mitral and tricuspid regurgitation (no MR, mild TR by 11/2011 echo), gout, arthritis, thyromegaly with LLL nodule '11, hysterectomy, tonsillectomy, cholecystectomy. PCP is Dr. [name].    Cardiologist is Dr. Ron Parker, last visit 03/18/12.  She was actually scheduled to see him 03/12/13, but canceled her appointment due to plans for surgery.  She did leave a message asking him if he would come by and say "hi" during her hospitalization.  She reports that office staff told her that he is actually out of town until 03/12/13.  She remains on Coreg and enalapril.  EKG on 03/10/13 showed NSR, low voltage QRS, non-specific T wave abnormality.  It was not felt significantly changed since her previous tracing  Echo on 11/20/11 showed: - Left ventricle: The cavity size was normal. Wall thickness was increased in a pattern of mild LVH. Systolic function was normal. The estimated ejection fraction was in the range of 50% to 55%. Wall motion was normal; there were no regional wall motion abnormalities. Features are consistent with a pseudonormal left ventricular filling pattern, with concomitant abnormal relaxation and increased filling pressure (grade 2 diastolic dysfunction). - Left atrium: The atrium was moderately dilated. - Atrial septum: No defect or patent foramen ovale was identified. - Tricuspid valve: Mild regurgitation.  [facility] notes from 2007 indicate that she had no CAD by previous cath--but that was from 1997. There is no record in Epic of recent stress test.  CXR on 03/10/13 showed no active cardiopulmonary disease.  Preoperative labs noted.  Cr 0.93, glucose 79.  AST/ALT WNL. H/H 9.4/30.3.  PT/INR  19.4/1.69.  PTT 42.  T&S done.  She ran out of Coumadin on 03/05/13, and notified [facility].  They did not resume it yet since she was having TKR on 03/12/13.  Will plan to repeat PT/PTT on the day of surgery.  I reviewed above with anesthesiologist Dr. Orene Desanctis.  She remains in SR.  Her EF had improved to 50-55% last year and she remains on ACEI and b-blocker therapy.  She will be evaluated by her assigned anesthesiologist on the day of surgery.  If no acute CV/CHF/arrhythmias then it is anticipated that she can proceed as planned.  George Hugh [facility] Short Stay Center/Anesthesiology Phone [phone] 03/11/2013 11:27 AM

## 2013-03-12 ENCOUNTER — Inpatient Hospital Stay (HOSPITAL_COMMUNITY): Payer: Medicare Other | Admitting: Certified Registered Nurse Anesthetist

## 2013-03-12 ENCOUNTER — Encounter (HOSPITAL_COMMUNITY)
Admission: RE | Disposition: A | Discharge status: Skilled Nursing Facility | Payer: Self-pay | Source: Ambulatory Visit | Attending: Orthopedic Surgery

## 2013-03-12 ENCOUNTER — Inpatient Hospital Stay (HOSPITAL_COMMUNITY)
Admission: RE | Admit: 2013-03-12 | Discharge: 2013-03-15 | DRG: 470 | Disposition: A | Discharge status: Skilled Nursing Facility | Payer: Medicare Other | Source: Ambulatory Visit | Attending: Orthopedic Surgery | Admitting: Orthopedic Surgery

## 2013-03-12 ENCOUNTER — Encounter (HOSPITAL_COMMUNITY): Payer: Medicare Other | Admitting: Vascular Surgery

## 2013-03-12 ENCOUNTER — Ambulatory Visit: Payer: Medicare Other | Admitting: Cardiology

## 2013-03-12 ENCOUNTER — Encounter (HOSPITAL_COMMUNITY): Payer: Self-pay | Admitting: Anesthesiology

## 2013-03-12 DIAGNOSIS — I059 Rheumatic mitral valve disease, unspecified: Secondary | ICD-10-CM | POA: Diagnosis present

## 2013-03-12 DIAGNOSIS — Z9089 Acquired absence of other organs: Secondary | ICD-10-CM

## 2013-03-12 DIAGNOSIS — K219 Gastro-esophageal reflux disease without esophagitis: Secondary | ICD-10-CM | POA: Diagnosis present

## 2013-03-12 DIAGNOSIS — I079 Rheumatic tricuspid valve disease, unspecified: Secondary | ICD-10-CM | POA: Diagnosis present

## 2013-03-12 DIAGNOSIS — F17200 Nicotine dependence, unspecified, uncomplicated: Secondary | ICD-10-CM | POA: Diagnosis present

## 2013-03-12 DIAGNOSIS — M1710 Unilateral primary osteoarthritis, unspecified knee: Principal | ICD-10-CM | POA: Diagnosis present

## 2013-03-12 DIAGNOSIS — Z794 Long term (current) use of insulin: Secondary | ICD-10-CM

## 2013-03-12 DIAGNOSIS — E1165 Type 2 diabetes mellitus with hyperglycemia: Secondary | ICD-10-CM | POA: Diagnosis not present

## 2013-03-12 DIAGNOSIS — Z96652 Presence of left artificial knee joint: Secondary | ICD-10-CM

## 2013-03-12 DIAGNOSIS — Z79899 Other long term (current) drug therapy: Secondary | ICD-10-CM

## 2013-03-12 DIAGNOSIS — Z6839 Body mass index (BMI) 39.0-39.9, adult: Secondary | ICD-10-CM

## 2013-03-12 DIAGNOSIS — F329 Major depressive disorder, single episode, unspecified: Secondary | ICD-10-CM | POA: Diagnosis present

## 2013-03-12 DIAGNOSIS — I129 Hypertensive chronic kidney disease with stage 1 through stage 4 chronic kidney disease, or unspecified chronic kidney disease: Secondary | ICD-10-CM | POA: Diagnosis present

## 2013-03-12 DIAGNOSIS — Z01812 Encounter for preprocedural laboratory examination: Secondary | ICD-10-CM

## 2013-03-12 DIAGNOSIS — N058 Unspecified nephritic syndrome with other morphologic changes: Secondary | ICD-10-CM | POA: Diagnosis present

## 2013-03-12 DIAGNOSIS — M109 Gout, unspecified: Secondary | ICD-10-CM | POA: Diagnosis present

## 2013-03-12 DIAGNOSIS — E1129 Type 2 diabetes mellitus with other diabetic kidney complication: Secondary | ICD-10-CM | POA: Diagnosis present

## 2013-03-12 DIAGNOSIS — D62 Acute posthemorrhagic anemia: Secondary | ICD-10-CM | POA: Diagnosis not present

## 2013-03-12 DIAGNOSIS — J438 Other emphysema: Secondary | ICD-10-CM | POA: Diagnosis present

## 2013-03-12 DIAGNOSIS — E669 Obesity, unspecified: Secondary | ICD-10-CM | POA: Diagnosis present

## 2013-03-12 DIAGNOSIS — N189 Chronic kidney disease, unspecified: Secondary | ICD-10-CM | POA: Diagnosis present

## 2013-03-12 DIAGNOSIS — I509 Heart failure, unspecified: Secondary | ICD-10-CM | POA: Diagnosis present

## 2013-03-12 DIAGNOSIS — IMO0002 Reserved for concepts with insufficient information to code with codable children: Secondary | ICD-10-CM | POA: Diagnosis not present

## 2013-03-12 DIAGNOSIS — I959 Hypotension, unspecified: Secondary | ICD-10-CM | POA: Diagnosis not present

## 2013-03-12 DIAGNOSIS — E1169 Type 2 diabetes mellitus with other specified complication: Secondary | ICD-10-CM | POA: Diagnosis not present

## 2013-03-12 DIAGNOSIS — Z23 Encounter for immunization: Secondary | ICD-10-CM

## 2013-03-12 DIAGNOSIS — Z7901 Long term (current) use of anticoagulants: Secondary | ICD-10-CM

## 2013-03-12 DIAGNOSIS — F3289 Other specified depressive episodes: Secondary | ICD-10-CM | POA: Diagnosis present

## 2013-03-12 DIAGNOSIS — I428 Other cardiomyopathies: Secondary | ICD-10-CM | POA: Diagnosis present

## 2013-03-12 DIAGNOSIS — I4891 Unspecified atrial fibrillation: Secondary | ICD-10-CM | POA: Diagnosis present

## 2013-03-12 DIAGNOSIS — E785 Hyperlipidemia, unspecified: Secondary | ICD-10-CM | POA: Diagnosis present

## 2013-03-12 HISTORY — PX: TOTAL KNEE ARTHROPLASTY: SHX125

## 2013-03-12 HISTORY — DX: Pneumonia, unspecified organism: J18.9

## 2013-03-12 LAB — PROTIME-INR
INR: 1.38 (ref 0.00–1.49)
Prothrombin Time: 16.6 seconds — ABNORMAL HIGH (ref 11.6–15.2)

## 2013-03-12 LAB — GLUCOSE, CAPILLARY
Glucose-Capillary: 109 mg/dL — ABNORMAL HIGH (ref 70–99)
Glucose-Capillary: 104 mg/dL — ABNORMAL HIGH (ref 70–99)
Glucose-Capillary: 117 mg/dL — ABNORMAL HIGH (ref 70–99)
Glucose-Capillary: 88 mg/dL (ref 70–99)

## 2013-03-12 LAB — APTT: aPTT: 36 seconds (ref 24–37)

## 2013-03-12 SURGERY — ARTHROPLASTY, KNEE, TOTAL
Anesthesia: General | Site: Knee | Laterality: Left | Wound class: Clean

## 2013-03-12 MED ORDER — LIDOCAINE HCL (CARDIAC) 20 MG/ML IV SOLN
INTRAVENOUS | Status: DC | PRN
  Administered 2013-03-12: 40 mg via INTRAVENOUS

## 2013-03-12 MED ORDER — FENTANYL CITRATE 0.05 MG/ML IJ SOLN
INTRAMUSCULAR | Status: AC
  Filled 2013-03-12: qty 2

## 2013-03-12 MED ORDER — INSULIN ASPART 100 UNIT/ML ~~LOC~~ SOLN
4.0000 [IU] | Freq: Three times a day (TID) | SUBCUTANEOUS | Status: DC
  Administered 2013-03-12 – 2013-03-14 (×3): 4 [IU] via SUBCUTANEOUS

## 2013-03-12 MED ORDER — HYDROMORPHONE HCL PF 1 MG/ML IJ SOLN
1.0000 mg | INTRAMUSCULAR | Status: DC | PRN
  Administered 2013-03-12 – 2013-03-13 (×3): 1 mg via INTRAVENOUS
  Filled 2013-03-12 (×3): qty 1

## 2013-03-12 MED ORDER — INSULIN ASPART 100 UNIT/ML ~~LOC~~ SOLN
0.0000 [IU] | Freq: Three times a day (TID) | SUBCUTANEOUS | Status: DC
  Administered 2013-03-14: 2 [IU] via SUBCUTANEOUS

## 2013-03-12 MED ORDER — METOCLOPRAMIDE HCL 10 MG PO TABS: 5.0000 mg | ORAL_TABLET | Freq: Three times a day (TID) | ORAL | Status: DC | PRN

## 2013-03-12 MED ORDER — FENTANYL CITRATE 0.05 MG/ML IJ SOLN
100.0000 ug | Freq: Once | INTRAMUSCULAR | Status: AC
  Administered 2013-03-12: 100 ug via INTRAVENOUS
  Filled 2013-03-12: qty 2

## 2013-03-12 MED ORDER — CEFAZOLIN SODIUM-DEXTROSE 2-3 GM-% IV SOLR
2.0000 g | Freq: Four times a day (QID) | INTRAVENOUS | Status: AC
  Administered 2013-03-12 – 2013-03-13 (×2): 2 g via INTRAVENOUS
  Filled 2013-03-12 (×2): qty 50

## 2013-03-12 MED ORDER — INFLUENZA VAC SPLIT QUAD 0.5 ML IM SUSP
0.5000 mL | INTRAMUSCULAR | Status: AC
  Administered 2013-03-13: 0.5 mL via INTRAMUSCULAR
  Filled 2013-03-12 (×3): qty 0.5

## 2013-03-12 MED ORDER — INSULIN DETEMIR 100 UNIT/ML ~~LOC~~ SOLN
38.0000 [IU] | Freq: Every day | SUBCUTANEOUS | Status: DC
  Administered 2013-03-12 – 2013-03-14 (×3): 38 [IU] via SUBCUTANEOUS
  Filled 2013-03-12 (×4): qty 0.38

## 2013-03-12 MED ORDER — ONDANSETRON HCL 4 MG/2ML IJ SOLN
INTRAMUSCULAR | Status: DC | PRN
  Administered 2013-03-12: 4 mg via INTRAMUSCULAR

## 2013-03-12 MED ORDER — ONDANSETRON HCL 4 MG/2ML IJ SOLN: 4.0000 mg | Freq: Four times a day (QID) | INTRAMUSCULAR | Status: DC | PRN

## 2013-03-12 MED ORDER — METOCLOPRAMIDE HCL 5 MG/ML IJ SOLN: 5.0000 mg | Freq: Three times a day (TID) | INTRAMUSCULAR | Status: DC | PRN

## 2013-03-12 MED ORDER — HYDROMORPHONE HCL PF 1 MG/ML IJ SOLN
0.2500 mg | INTRAMUSCULAR | Status: DC | PRN
  Administered 2013-03-12 (×3): 0.5 mg via INTRAVENOUS

## 2013-03-12 MED ORDER — WARFARIN - PHARMACIST DOSING INPATIENT: Freq: Every day | Status: DC

## 2013-03-12 MED ORDER — ARTIFICIAL TEARS OP OINT
TOPICAL_OINTMENT | OPHTHALMIC | Status: DC | PRN
  Administered 2013-03-12: 1 via OPHTHALMIC

## 2013-03-12 MED ORDER — HYDROMORPHONE HCL PF 1 MG/ML IJ SOLN
INTRAMUSCULAR | Status: AC
  Filled 2013-03-12: qty 1

## 2013-03-12 MED ORDER — LIRAGLUTIDE 18 MG/3ML ~~LOC~~ SOLN: 0.6000 mg | Freq: Every day | SUBCUTANEOUS | Status: DC

## 2013-03-12 MED ORDER — ACETAMINOPHEN 650 MG RE SUPP: 650.0000 mg | Freq: Four times a day (QID) | RECTAL | Status: DC | PRN

## 2013-03-12 MED ORDER — SODIUM CHLORIDE 0.9 % IV SOLN
INTRAVENOUS | Status: DC
  Administered 2013-03-12: 20 mL/h via INTRAVENOUS

## 2013-03-12 MED ORDER — MENTHOL 3 MG MT LOZG: 1.0000 | LOZENGE | OROMUCOSAL | Status: DC | PRN

## 2013-03-12 MED ORDER — PHENOL 1.4 % MT LIQD: 1.0000 | OROMUCOSAL | Status: DC | PRN

## 2013-03-12 MED ORDER — KETOROLAC TROMETHAMINE 30 MG/ML IJ SOLN
INTRAMUSCULAR | Status: AC
  Filled 2013-03-12: qty 1

## 2013-03-12 MED ORDER — ACETAMINOPHEN 325 MG PO TABS: 650.0000 mg | ORAL_TABLET | Freq: Four times a day (QID) | ORAL | Status: DC | PRN

## 2013-03-12 MED ORDER — ENALAPRIL MALEATE 10 MG PO TABS
10.0000 mg | ORAL_TABLET | Freq: Two times a day (BID) | ORAL | Status: DC
  Administered 2013-03-12 – 2013-03-15 (×5): 10 mg via ORAL
  Filled 2013-03-12 (×7): qty 1

## 2013-03-12 MED ORDER — SENNOSIDES-DOCUSATE SODIUM 8.6-50 MG PO TABS: 1.0000 | ORAL_TABLET | Freq: Every evening | ORAL | Status: DC | PRN

## 2013-03-12 MED ORDER — KETOROLAC TROMETHAMINE 15 MG/ML IJ SOLN
7.5000 mg | Freq: Four times a day (QID) | INTRAMUSCULAR | Status: AC
  Administered 2013-03-12 – 2013-03-13 (×4): 7.5 mg via INTRAVENOUS
  Filled 2013-03-12 (×3): qty 1

## 2013-03-12 MED ORDER — BISACODYL 5 MG PO TBEC: 5.0000 mg | DELAYED_RELEASE_TABLET | Freq: Every day | ORAL | Status: DC | PRN

## 2013-03-12 MED ORDER — WARFARIN SODIUM 2.5 MG PO TABS
12.5000 mg | ORAL_TABLET | Freq: Once | ORAL | Status: AC
  Administered 2013-03-12: 18:00:00 12.5 mg via ORAL
  Filled 2013-03-12: qty 1

## 2013-03-12 MED ORDER — COLCHICINE 0.6 MG PO TABS
0.6000 mg | ORAL_TABLET | Freq: Two times a day (BID) | ORAL | Status: DC
  Administered 2013-03-12 – 2013-03-15 (×6): 0.6 mg via ORAL
  Filled 2013-03-12 (×7): qty 1

## 2013-03-12 MED ORDER — MAGNESIUM CITRATE PO SOLN
1.0000 | Freq: Once | ORAL | Status: AC | PRN
  Filled 2013-03-12: qty 296

## 2013-03-12 MED ORDER — ALLOPURINOL 100 MG PO TABS
100.0000 mg | ORAL_TABLET | Freq: Two times a day (BID) | ORAL | Status: DC
  Administered 2013-03-12 – 2013-03-15 (×6): 100 mg via ORAL
  Filled 2013-03-12 (×7): qty 1

## 2013-03-12 MED ORDER — BUPIVACAINE LIPOSOME 1.3 % IJ SUSP
20.0000 mL | Freq: Once | INTRAMUSCULAR | Status: DC
  Filled 2013-03-12: qty 20

## 2013-03-12 MED ORDER — MUPIROCIN 2 % EX OINT
1.0000 "application " | TOPICAL_OINTMENT | Freq: Two times a day (BID) | CUTANEOUS | Status: DC
  Administered 2013-03-12 – 2013-03-15 (×6): 1 via NASAL
  Filled 2013-03-12: qty 22

## 2013-03-12 MED ORDER — PANTOPRAZOLE SODIUM 20 MG PO TBEC
20.0000 mg | DELAYED_RELEASE_TABLET | Freq: Every day | ORAL | Status: DC
  Administered 2013-03-12 – 2013-03-15 (×4): 20 mg via ORAL
  Filled 2013-03-12 (×4): qty 1

## 2013-03-12 MED ORDER — DOCUSATE SODIUM 100 MG PO CAPS
100.0000 mg | ORAL_CAPSULE | Freq: Two times a day (BID) | ORAL | Status: DC
  Administered 2013-03-12 – 2013-03-15 (×6): 100 mg via ORAL
  Filled 2013-03-12 (×7): qty 1

## 2013-03-12 MED ORDER — SODIUM CHLORIDE 0.9 % IR SOLN
Status: DC | PRN
  Administered 2013-03-12: 3000 mL

## 2013-03-12 MED ORDER — LACTATED RINGERS IV SOLN
INTRAVENOUS | Status: DC | PRN
  Administered 2013-03-12 (×2): via INTRAVENOUS

## 2013-03-12 MED ORDER — ONDANSETRON HCL 4 MG PO TABS: 4.0000 mg | ORAL_TABLET | Freq: Four times a day (QID) | ORAL | Status: DC | PRN

## 2013-03-12 MED ORDER — CHLORHEXIDINE GLUCONATE CLOTH 2 % EX PADS
6.0000 | MEDICATED_PAD | Freq: Every day | CUTANEOUS | Status: DC
  Administered 2013-03-13 – 2013-03-14 (×2): 6 via TOPICAL

## 2013-03-12 MED ORDER — CARVEDILOL 12.5 MG PO TABS
12.5000 mg | ORAL_TABLET | Freq: Two times a day (BID) | ORAL | Status: DC
  Administered 2013-03-12 – 2013-03-15 (×6): 12.5 mg via ORAL
  Filled 2013-03-12 (×9): qty 1

## 2013-03-12 MED ORDER — OXYCODONE HCL 5 MG PO TABS
5.0000 mg | ORAL_TABLET | ORAL | Status: DC | PRN
  Administered 2013-03-13 – 2013-03-15 (×12): 10 mg via ORAL
  Filled 2013-03-12 (×12): qty 2

## 2013-03-12 MED ORDER — SODIUM CHLORIDE 0.9 % IJ SOLN
INTRAMUSCULAR | Status: DC | PRN
  Administered 2013-03-12: 13:00:00

## 2013-03-12 MED ORDER — PHENYLEPHRINE HCL 10 MG/ML IJ SOLN
INTRAMUSCULAR | Status: DC | PRN
  Administered 2013-03-12 (×6): 80 ug via INTRAVENOUS

## 2013-03-12 MED ORDER — LACTATED RINGERS IV SOLN
INTRAVENOUS | Status: DC
  Administered 2013-03-12: 11:00:00 via INTRAVENOUS

## 2013-03-12 MED ORDER — ONDANSETRON HCL 4 MG/2ML IJ SOLN: 4.0000 mg | Freq: Once | INTRAMUSCULAR | Status: DC | PRN

## 2013-03-12 MED ORDER — PROPOFOL 10 MG/ML IV BOLUS
INTRAVENOUS | Status: DC | PRN
  Administered 2013-03-12: 20 mg via INTRAVENOUS
  Administered 2013-03-12: 140 mg via INTRAVENOUS
  Administered 2013-03-12: 40 mg via INTRAVENOUS
  Administered 2013-03-12: 20 mg via INTRAVENOUS

## 2013-03-12 MED ORDER — INSULIN DETEMIR 100 UNIT/ML FLEXPEN: 38.0000 [IU] | PEN_INJECTOR | Freq: Every day | SUBCUTANEOUS | Status: DC

## 2013-03-12 MED ORDER — FENTANYL CITRATE 0.05 MG/ML IJ SOLN
INTRAMUSCULAR | Status: DC | PRN
  Administered 2013-03-12 (×2): 50 ug via INTRAVENOUS
  Administered 2013-03-12: 100 ug via INTRAVENOUS
  Administered 2013-03-12: 50 ug via INTRAVENOUS

## 2013-03-12 SURGICAL SUPPLY — 57 items
BLADE SAGITTAL 25.0X1.27X90 (BLADE) ×2 IMPLANT
BLADE SAW SGTL 13.0X1.19X90.0M (BLADE) ×2 IMPLANT
BLADE SURG 21 STRL SS (BLADE) ×4 IMPLANT
BNDG COHESIVE 6X5 TAN NS LF (GAUZE/BANDAGES/DRESSINGS) ×2 IMPLANT
BNDG COHESIVE 6X5 TAN STRL LF (GAUZE/BANDAGES/DRESSINGS) ×4 IMPLANT
BONE CEMENT PALACOSE (Orthopedic Implant) ×4 IMPLANT
BOWL SMART MIX CTS (DISPOSABLE) IMPLANT
CAP POR TM CP VIT E LN CER HD ×2 IMPLANT
CEMENT BONE PALACOSE (Orthopedic Implant) ×2 IMPLANT
CLOTH BEACON ORANGE TIMEOUT ST (SAFETY) ×2 IMPLANT
COVER SURGICAL LIGHT HANDLE (MISCELLANEOUS) ×2 IMPLANT
CUFF TOURNIQUET SINGLE 34IN LL (TOURNIQUET CUFF) ×2 IMPLANT
CUFF TOURNIQUET SINGLE 44IN (TOURNIQUET CUFF) IMPLANT
DRAPE EXTREMITY T 121X128X90 (DRAPE) ×2 IMPLANT
DRAPE PROXIMA HALF (DRAPES) ×2 IMPLANT
DRAPE U-SHAPE 47X51 STRL (DRAPES) ×2 IMPLANT
DRSG ADAPTIC 3X8 NADH LF (GAUZE/BANDAGES/DRESSINGS) ×2 IMPLANT
DRSG PAD ABDOMINAL 8X10 ST (GAUZE/BANDAGES/DRESSINGS) ×2 IMPLANT
DURAPREP 26ML APPLICATOR (WOUND CARE) ×2 IMPLANT
ELECT REM PT RETURN 9FT ADLT (ELECTROSURGICAL) ×2
ELECTRODE REM PT RTRN 9FT ADLT (ELECTROSURGICAL) ×1 IMPLANT
FACESHIELD LNG OPTICON STERILE (SAFETY) ×2 IMPLANT
GLOVE BIOGEL PI IND STRL 7.0 (GLOVE) ×3 IMPLANT
GLOVE BIOGEL PI IND STRL 7.5 (GLOVE) ×1 IMPLANT
GLOVE BIOGEL PI IND STRL 9 (GLOVE) ×1 IMPLANT
GLOVE BIOGEL PI INDICATOR 7.0 (GLOVE) ×3
GLOVE BIOGEL PI INDICATOR 7.5 (GLOVE) ×1
GLOVE BIOGEL PI INDICATOR 9 (GLOVE) ×1
GLOVE SURG ORTHO 9.0 STRL STRW (GLOVE) ×2 IMPLANT
GLOVE SURG SS PI 6.5 STRL IVOR (GLOVE) ×4 IMPLANT
GLOVE SURG SS PI 8.0 STRL IVOR (GLOVE) ×2 IMPLANT
GOWN PREVENTION PLUS XLARGE (GOWN DISPOSABLE) ×2 IMPLANT
GOWN SRG XL XLNG 56XLVL 4 (GOWN DISPOSABLE) ×2 IMPLANT
GOWN STRL NON-REIN XL XLG LVL4 (GOWN DISPOSABLE) ×2
HANDPIECE INTERPULSE COAX TIP (DISPOSABLE) ×1
KIT BASIN OR (CUSTOM PROCEDURE TRAY) ×2 IMPLANT
KIT ROOM TURNOVER OR (KITS) ×2 IMPLANT
MANIFOLD NEPTUNE II (INSTRUMENTS) ×2 IMPLANT
NEEDLE SPNL 18GX3.5 QUINCKE PK (NEEDLE) ×2 IMPLANT
NS IRRIG 1000ML POUR BTL (IV SOLUTION) ×2 IMPLANT
PACK TOTAL JOINT (CUSTOM PROCEDURE TRAY) ×2 IMPLANT
PAD ARMBOARD 7.5X6 YLW CONV (MISCELLANEOUS) ×4 IMPLANT
PADDING CAST ABS 6INX4YD NS (CAST SUPPLIES) ×1
PADDING CAST ABS COTTON 6X4 NS (CAST SUPPLIES) ×1 IMPLANT
PADDING CAST COTTON 6X4 STRL (CAST SUPPLIES) ×2 IMPLANT
SET HNDPC FAN SPRY TIP SCT (DISPOSABLE) ×1 IMPLANT
SPONGE GAUZE 4X4 12PLY (GAUZE/BANDAGES/DRESSINGS) ×2 IMPLANT
STAPLER VISISTAT 35W (STAPLE) ×2 IMPLANT
SUCTION FRAZIER TIP 10 FR DISP (SUCTIONS) IMPLANT
SUT VIC AB 0 CTB1 27 (SUTURE) ×2 IMPLANT
SUT VIC AB 1 CTX 36 (SUTURE) ×1
SUT VIC AB 1 CTX36XBRD ANBCTR (SUTURE) ×1 IMPLANT
SYR 50ML LL SCALE MARK (SYRINGE) ×2 IMPLANT
TOWEL OR 17X24 6PK STRL BLUE (TOWEL DISPOSABLE) ×2 IMPLANT
TOWEL OR 17X26 10 PK STRL BLUE (TOWEL DISPOSABLE) ×2 IMPLANT
WATER STERILE IRR 1000ML POUR (IV SOLUTION) ×4 IMPLANT
WRAP KNEE MAXI GEL POST OP (GAUZE/BANDAGES/DRESSINGS) ×2 IMPLANT

## 2013-03-12 NOTE — Progress Notes (Signed)
UR COMPLETED  

## 2013-03-12 NOTE — Op Note (Signed)
OPERATIVE REPORT  DATE OF SURGERY: 03/12/2013  PATIENT:  [name],  67 y.o. female  PRE-OPERATIVE DIAGNOSIS:  Osteoarthritis Left Knee  POST-OPERATIVE DIAGNOSIS:  Osteoarthritis Left Knee  PROCEDURE:  Procedure(s): TOTAL KNEE ARTHROPLASTY Zimmer components. Size E. tibia. Size 7 femur. 13 mm polyethylene tray. 29 mm patella  SURGEON:  Surgeon(s): Newt Minion, MD  ANESTHESIA:   local, regional and general  EBL:  Minimal ML  SPECIMEN:  No Specimen  TOURNIQUET:    PROCEDURE DETAILS: Patient is a 67 year old woman with tricompartmental arthritis of her left knee she has failed conservative care and presents at this time for surgical intervention risks and benefits were discussed including infection DVT need for additional surgery. Patient states she understands and wished to proceed at this time. Description of procedure patient brought to the operating room after femoral block she then underwent general anesthetic. After adequate levels of anesthesia were obtained patient's left lower extremity was prepped using DuraPrep draped in a sterile field an [place] was used to cover all exposed skin. A midline incision was made carried down to a medial parapatellar retinacular incision. Patient had a significant amount of a hemarthrosis which was debrided with synovectomy. Using the IM guide 10 mm was taken off the femur with 5 of valgus cutting block was then used with 3 of external rotation for a size 7 femur. Tibia was then resected with 10 mm taken off the tibia 7 posterior slope neutral varus and valgus the keel punch was then made for the size E. tibia. The patella was resurfaced 10 mm was taken off the patella and sized for a 29 mm patella. The trial components were placed the box cut was made for the femur trial polyethylene tray as were tried and patient had good stability with a 13 mm polyethylene tray. Trial components were removed. The wound is irrigated with pulsatile  lavage. The tibial and femoral components were cemented in place the patella was cemented in place and clamped and the polyethylene tray was placed locked into position and the knee was left in extension until the cement hardened the knee was irrigated with pulsatile lavage. The retinaculum was closed using #1 Vicryl subcutaneous is closed using 0 Vicryl skin was closed using staples. Wound was covered with Adaptic orthopedic sponges AB dressing Kerlix and Coban. Patient was extubated taken the PACU in stable condition. The popliteal fossa was injected with a total of 60 cc of Exparel. Care was taken to not have this be an intravascular injection. Discharged to PACU in stable condition. Plan for discharge to skilled nursing.  PLAN OF CARE: Admit to inpatient   PATIENT DISPOSITION:  PACU - hemodynamically stable.   Newt Minion, MD 03/12/2013 2:31 PM

## 2013-03-12 NOTE — Evaluation (Signed)
Physical Therapy Evaluation Patient Details Name: [name] MRN: [id number] DOB: [birth year] Today's Date: 03/12/2013 Time: [id number] PT Time Calculation (min): 27 min  PT Assessment / Plan / Recommendation History of Present Illness  Patient is a 67 y/o female admitted for left TKA.  Clinical Impression  Patient presents with decreased independence with mobility due to pain, decreased strength and ROM left LE and will benefit from skilled PT in the acute setting to maximize independence to allow d/c home independent following SNF rehab stay.    PT Assessment  Patient needs continued PT services    Follow Up Recommendations  SNF    Does the patient have the potential to tolerate intense rehabilitation    N/A  Barriers to Discharge  Decreased caregiver support      Equipment Recommendations  3in1 (PT)    Recommendations for Other Services   None  Frequency 7X/week    Precautions / Restrictions Precautions Precautions: Knee;Fall Restrictions Weight Bearing Restrictions: Yes LLE Weight Bearing: Weight bearing as tolerated   Pertinent Vitals/Pain Moderate pain left knee (better than when I woke up in recovery)      Mobility  Bed Mobility Bed Mobility: Supine to Sit;Sit to Supine;Sitting - Scoot to Edge of Bed Supine to Sit: 4: Min assist;With rails Sitting - Scoot to [employer] of Bed: 4: Min assist Sit to Supine: 4: Min assist;HOB flat Details for Bed Mobility Assistance: assist for left LE and cues for technique Transfers Transfers: Sit to Stand;Stand to Sit Sit to Stand: 4: Min assist;From bed;With upper extremity assist;From elevated surface Stand to Sit: 4: Min assist;3: Mod assist;To bed Details for Transfer Assistance: assist to sit due to couldn't take step back and cues for left leg out  Ambulation/Gait Ambulation/Gait Assistance: 4: Min assist Ambulation Distance (Feet): 1 Feet Assistive device: Rolling walker Ambulation/Gait Assistance Details: step  forward and back (though difficulty stepping back); cues for sequence and WBAT Gait Pattern: Antalgic;Step-to pattern    Exercises Total Joint Exercises Ankle Circles/Pumps: AROM;10 reps;Supine Quad Sets: AROM;Left;5 reps;Supine Heel Slides: AAROM;Left;5 reps;Supine   PT Diagnosis: Difficulty walking;Acute pain  PT Problem List: Decreased strength;Decreased range of motion;Decreased activity tolerance;Pain;Decreased mobility;Decreased safety awareness PT Treatment Interventions: DME instruction;Balance training;Gait training;Functional mobility training;Patient/family education;Stair training;Therapeutic activities;Therapeutic exercise     PT Goals(Current goals can be found in the care plan section) Acute Rehab PT Goals Patient Stated Goal: To return to independent PT Goal Formulation: With patient Time For Goal Achievement: 03/26/13 Potential to Achieve Goals: Good  Visit Information  Last PT Received On: 03/12/13 Assistance Needed: +1 History of Present Illness: Patient is a 67 y/o female admitted for left TKA.       Prior [place] expects to be discharged to:: Skilled nursing facility Living Arrangements: Alone Prior Function Level of Independence: Independent with assistive device(s) Comments: walked with cane versus walker prior to admission Communication Communication: No difficulties Dominant Hand: Right    Cognition  Cognition Arousal/Alertness: Awake/alert Behavior During Therapy: WFL for tasks assessed/performed Overall Cognitive Status: Within Functional Limits for tasks assessed    Extremity/Trunk Assessment Lower Extremity Assessment Lower Extremity Assessment: LLE deficits/detail LLE Deficits / Details: AAROM knee flexion approx 20 degrees, extension to neutral.  Strength grossly 2-/5   Balance Balance Balance Assessed: Yes Static Standing Balance Static Standing - Balance Support: Bilateral upper extremity  supported Static Standing - Level of Assistance: 4: Min assist  End of Session PT - End of Session Equipment Utilized During Treatment: Gait  belt Activity Tolerance: Patient limited by pain;Patient limited by fatigue Patient left: in bed;with family/visitor present;with call bell/phone within reach Nurse Communication: Mobility status  GP     [facility] 03/12/2013, 4:52 PM Magda Kiel, [place] 03/12/2013

## 2013-03-12 NOTE — Progress Notes (Signed)
Orthopedic Tech Progress Note Patient Details:  [name] 04-16-1946 [id number] Applied overhead frame to bed Ortho Devices Ortho Device/Splint Location: footsie roll   Braulio Bosch 03/12/2013, 5:15 PM

## 2013-03-12 NOTE — Progress Notes (Signed)
ANTICOAGULATION CONSULT NOTE - Initial Consult  Pharmacy Consult for Coumadin Indication: VTE prophylaxis  Allergies  Allergen Reactions  . Codeine Hives and Nausea And Vomiting    Patient Measurements: Height: 5\' 9"  (175.3 cm) (from preadmit visit 03/10/13) Weight: 229 lb 9.6 oz (104.146 kg) (from preadmit visit 03/10/13) IBW/kg (Calculated) : 66.2   Vital Signs: Temp: 98.6 F (37 C) (10/10 1604) Temp src: Oral (10/10 1032) BP: 136/66 mmHg (10/10 1604) Pulse Rate: 84 (10/10 1604)  Labs:  Recent Labs  03/10/13 1502 03/12/13 1007  HGB 9.4*  --   HCT 30.3*  --   PLT 483*  --   APTT 42* 36  LABPROT 19.4* 16.6*  INR 1.69* 1.38  CREATININE 0.93  --     Estimated Creatinine Clearance: 74.4 ml/min (by C-G formula based on Cr of 0.93).   Medical History: Past Medical History  Diagnosis Date  . Hyperkalemia   . Overweight(278.02)   . CHF (congestive heart failure)   . HLD (hyperlipidemia)   . Depression   . History of bronchitis   . History of emphysema   . Atrial fibrillation     Cardioverted to NSR 08/22/2009  . CKD (chronic kidney disease)   . Mitral regurgitation     mild - echo - 1/11 /  Mild, echo, May15, 2012  . Tricuspid regurgitation     moderate - echo - 1/11  . Drug therapy 1/11    coumadin  . Thyroid dysfunction     thyromegally,diffuse,nodule LLL,06/2009  . Ejection fraction < 50%     20-25%, echo.06/2009, /  EF 30-35%, diffuse hypokinesis, echo, Oct 16, 2010  . LFT elevation     06/2009  . Cardiomyopathy   . Tobacco abuse   . Hypertension   . DM2 (diabetes mellitus, type 2)     fasting 80-100  . GERD (gastroesophageal reflux disease)   . Dizziness   . Arthritis   . Gout     Medications:  Prescriptions prior to admission  Medication Sig Dispense Refill  . acetaminophen (TYLENOL) 500 MG tablet Take 1,000 mg by mouth 3 (three) times daily as needed for pain.      Marland Kitchen allopurinol (ZYLOPRIM) 100 MG tablet Take 100 mg by mouth 2 (two) times  daily.      . carvedilol (COREG) 12.5 MG tablet Take 12.5 mg by mouth 2 (two) times daily with a meal.      . colchicine 0.6 MG tablet Take 0.6 mg by mouth 2 (two) times daily.      . Desoximetasone (TOPICORT) 0.25 % ointment Apply 1 application topically daily. Apply to face and neck      . enalapril (VASOTEC) 10 MG tablet Take 1 tablet (10 mg total) by mouth 2 (two) times daily.  60 tablet  7  . HYDROcodone-acetaminophen (NORCO) 10-325 MG per tablet Take 1 tablet by mouth every 8 (eight) hours as needed for pain.      . Insulin Detemir (LEVEMIR FLEXPEN) 100 UNIT/ML SOPN Inject 38 Units into the skin at bedtime.      . lansoprazole (PREVACID) 15 MG capsule Take 15 mg by mouth daily.      . Liraglutide (VICTOZA) 18 MG/3ML SOLN Inject 0.6 mg into the skin daily after supper. 4pm      . Multiple Vitamin (MULITIVITAMIN WITH MINERALS) TABS Take 1 tablet by mouth daily. Centrum Silver      . warfarin (COUMADIN) 5 MG tablet Take 10 mg by mouth at bedtime.  Assessment: 67 y.o female s/p L. TKA today 03/12/13.  Prior to admit the patient is on chronic coumadin 10 mg daily for  h/o afib. She is to resume her coumadin for post op VTE prophylaxis and h/o afib. Her last dose of coumadin prior to admit was 03/07/13. Today the INR is 1.38, SUBtherapeutic.  Ortho has also ordered bilateral SCDs and TED hose.  Preadmit visit 03/10/13 the Hgb was low at 9.4 and pltc = 483K.  PTA coumadin dose:  10 mg daily (last taken 03/07/13).   Goal of Therapy:  INR 2-3 Monitor platelets by anticoagulation protocol: Yes   Plan:  Coumadin 12.5 mg x1 today Daily PT/INR  Nicole Cella, RPh Clinical Pharmacist Pager: [phone] 03/12/2013,4:56 PM

## 2013-03-12 NOTE — Transfer of Care (Signed)
Immediate Anesthesia Transfer of Care Note  Patient: [name]  Procedure(s) Performed: Procedure(s) with comments: TOTAL KNEE ARTHROPLASTY (Left) - Left total knee arthroplasty  Patient Location: PACU  Anesthesia Type:General and Regional  Level of Consciousness: awake and patient cooperative  Airway & Oxygen Therapy: Patient Spontanous Breathing and Patient connected to nasal cannula oxygen  Post-op Assessment: Report given to PACU RN, Post -op Vital signs reviewed and stable and Patient moving all extremities X 4  Post vital signs: Reviewed and stable  Complications: No apparent anesthesia complications

## 2013-03-12 NOTE — H&P (Signed)
TOTAL KNEE ADMISSION H&P  Patient is being admitted for left total knee arthroplasty.  Subjective:  Chief Complaint:left knee pain.  HPI: [name], 67 y.o. female, has a history of pain and functional disability in the left knee due to arthritis and has failed non-surgical conservative treatments for greater than 12 weeks to includeNSAID's and/or analgesics, corticosteriod injections, use of assistive devices and activity modification.  Onset of symptoms was gradual, starting 8 years ago with gradually worsening course since that time. The patient noted no past surgery on the left knee(s).  Patient currently rates pain in the left knee(s) at 8 out of 10 with activity. Patient has night pain, worsening of pain with activity and weight bearing, pain that interferes with activities of daily living, pain with passive range of motion, crepitus and joint swelling.  Patient has evidence of subchondral cysts, subchondral sclerosis, periarticular osteophytes and joint space narrowing by imaging studies. This patient has had Failure conservative care. There is no active infection.  Patient Active Problem List   Diagnosis Date Noted  . Tobacco abuse   . Furunculosis of multiple sites 08/19/2011  . Rash, skin 08/19/2011  . DM (diabetes mellitus) type II uncontrolled with renal manifestation 08/17/2011  . Dehydration 08/17/2011  . Acute kidney injury 08/17/2011  . CKD (chronic kidney disease) 08/17/2011  . History of atrial fibrillation 08/17/2011  . Cardiomyopathy   . Ejection fraction < 50%   . Mitral regurgitation   . Atrial fibrillation   . Tricuspid regurgitation   . Thyroid dysfunction   . LFT elevation   . Long term current use of anticoagulant 07/16/2010  . HYPERKALEMIA 08/15/2009  . OVERWEIGHT/OBESITY 08/15/2009  . CONGESTIVE HEART FAILURE, UNSPECIFIED 08/15/2009  . DIABETES MELLITUS, TYPE II 10/02/2007  . HYPERLIPIDEMIA 10/02/2007  . DEPRESSION 10/02/2007  . BRONCHITIS  10/02/2007  . EMPHYSEMA 10/02/2007  . GERD 10/02/2007  . BREAST BIOPSY, HX OF 10/02/2007   Past Medical History  Diagnosis Date  . Hyperkalemia   . Overweight(278.02)   . CHF (congestive heart failure)   . HLD (hyperlipidemia)   . Depression   . History of bronchitis   . History of emphysema   . Atrial fibrillation     Cardioverted to NSR 08/22/2009  . CKD (chronic kidney disease)   . Mitral regurgitation     mild - echo - 1/11 /  Mild, echo, May15, 2012  . Tricuspid regurgitation     moderate - echo - 1/11  . Drug therapy 1/11    coumadin  . Thyroid dysfunction     thyromegally,diffuse,nodule LLL,06/2009  . Ejection fraction < 50%     20-25%, echo.06/2009, /  EF 30-35%, diffuse hypokinesis, echo, Oct 16, 2010  . LFT elevation     06/2009  . Cardiomyopathy   . Tobacco abuse   . Hypertension   . DM2 (diabetes mellitus, type 2)     fasting 80-100  . GERD (gastroesophageal reflux disease)   . Dizziness   . Arthritis   . Gout     Past Surgical History  Procedure Laterality Date  . Cholecystectomy    . Hysterectomy unknow    . Tonsillectomy    . Breast biopsy    . Abdominal hysterectomy      No prescriptions prior to admission   Allergies  Allergen Reactions  . Codeine Hives and Nausea And Vomiting    History  Substance Use Topics  . Smoking status: Current Every Day Smoker -- 0.50 packs/day for 27  years    Types: Cigarettes  . Smokeless tobacco: Not on file  . Alcohol Use: No    Family History  Problem Relation Age of Onset  . Colon cancer    . Heart disease       Review of Systems  All other systems reviewed and are negative.    Objective:  Physical Exam  Vital signs in last 24 hours:    Labs:   Estimated body mass index is 39.13 kg/(m^2) as calculated from the following:   Height as of 03/18/12: 5\' 9"  (1.753 m).   Weight as of 03/18/12: 120.258 kg (265 lb 1.9 oz).   Imaging Review Plain radiographs demonstrate moderate degenerative  joint disease of the left knee(s). The overall alignment ismild varus. The bone quality appears to be adequate for age and reported activity level.  Assessment/Plan:  End stage arthritis, left knee   The patient history, physical examination, clinical judgment of the provider and imaging studies are consistent with end stage degenerative joint disease of the left knee(s) and total knee arthroplasty is deemed medically necessary. The treatment options including medical management, injection therapy arthroscopy and arthroplasty were discussed at length. The risks and benefits of total knee arthroplasty were presented and reviewed. The risks due to aseptic loosening, infection, stiffness, patella tracking problems, thromboembolic complications and other imponderables were discussed. The patient acknowledged the explanation, agreed to proceed with the plan and consent was signed. Patient is being admitted for inpatient treatment for surgery, pain control, PT, OT, prophylactic antibiotics, VTE prophylaxis, progressive ambulation and ADL's and discharge planning. The patient is planning to be discharged home with home health services

## 2013-03-12 NOTE — Anesthesia Preprocedure Evaluation (Signed)
Anesthesia Evaluation  Patient identified by MRN, date of birth, ID band Patient awake    Reviewed: Allergy & Precautions, H&P , NPO status , Patient's Chart, lab work & pertinent test results  Airway Mallampati: I TM Distance: >3 FB Neck ROM: full    Dental   Pulmonary COPDCurrent Smoker,          Cardiovascular hypertension, +CHF + dysrhythmias Atrial Fibrillation Rhythm:irregular Rate:Abnormal     Neuro/Psych PSYCHIATRIC DISORDERS    GI/Hepatic GERD-  ,  Endo/Other  diabetes, Type 2, Insulin Dependent  Renal/GU Renal InsufficiencyRenal disease     Musculoskeletal   Abdominal   Peds  Hematology   Anesthesia Other Findings   Reproductive/Obstetrics                           Anesthesia Physical Anesthesia Plan  ASA: III  Anesthesia Plan: General   Post-op Pain Management:    Induction: Intravenous  Airway Management Planned: Oral ETT  Additional Equipment:   Intra-op Plan:   Post-operative Plan: Extubation in OR  Informed Consent: I have reviewed the patients History and Physical, chart, labs and discussed the procedure including the risks, benefits and alternatives for the proposed anesthesia with the patient or authorized representative who has indicated his/her understanding and acceptance.     Plan Discussed with: CRNA, Anesthesiologist and Surgeon  Anesthesia Plan Comments:         Anesthesia Quick Evaluation

## 2013-03-12 NOTE — Anesthesia Postprocedure Evaluation (Signed)
  Anesthesia Post-op Note  Patient: [name]  Procedure(s) Performed: Procedure(s) with comments: TOTAL KNEE ARTHROPLASTY (Left) - Left total knee arthroplasty  Patient Location: PACU  Anesthesia Type:General and GA combined with regional for post-op pain  Level of Consciousness: awake, alert  and oriented  Airway and Oxygen Therapy: Patient Spontanous Breathing and Patient connected to nasal cannula oxygen  Post-op Pain: mild  Post-op Assessment: Post-op Vital signs reviewed, Patient's Cardiovascular Status Stable, Respiratory Function Stable, Patent Airway and Pain level controlled  Post-op Vital Signs: stable  Complications: No apparent anesthesia complications

## 2013-03-12 NOTE — Preoperative (Signed)
Beta Blockers   Reason not to administer Beta Blockers:Not Applicable  PM dose Coreg 1900 10.9.14

## 2013-03-12 NOTE — Anesthesia Procedure Notes (Signed)
Anesthesia Regional Block:  Femoral nerve block  Pre-Anesthetic Checklist: ,, timeout performed, Correct Patient, Correct Site, Correct Laterality, Correct Procedure, Correct Position, site marked, Risks and benefits discussed,  Surgical consent,  Pre-op evaluation,  At surgeon's request and post-op pain management  Laterality: Left  Prep: chloraprep and alcohol swabs       Needles:  Injection technique: Single-shot  Needle Type: Stimulator Needle - 80          Additional Needles:  Procedures: nerve stimulator Femoral nerve block  Nerve Stimulator or Paresthesia:  Response: 0.5 mA, 0.1 ms, 5 cm  Additional Responses:   Narrative:  Start time: 03/12/2013 11:20 AM End time: 03/12/2013 11:25 AM Injection made incrementally with aspirations every 5 mL.  Performed by: Personally  Anesthesiologist: Sharolyn Douglas MD  Additional Notes: Pt accepts procedure and risks. 15cc 0.5% Marcaine w/ epi w/ minor difficulty. Pt tolerated well. GES

## 2013-03-13 LAB — BASIC METABOLIC PANEL
BUN: 16 mg/dL (ref 6–23)
CO2: 25 mEq/L (ref 19–32)
Calcium: 8.1 mg/dL — ABNORMAL LOW (ref 8.4–10.5)
Chloride: 108 mEq/L (ref 96–112)
Creatinine, Ser: 1.13 mg/dL — ABNORMAL HIGH (ref 0.50–1.10)
GFR calc Af Amer: 57 mL/min — ABNORMAL LOW (ref >90)
GFR calc non Af Amer: 49 mL/min — ABNORMAL LOW (ref >90)
Glucose, Bld: 102 mg/dL — ABNORMAL HIGH (ref 70–99)
Potassium: 3.9 mEq/L (ref 3.5–5.1)
Sodium: 141 mEq/L (ref 135–145)

## 2013-03-13 LAB — CBC
HCT: 23.3 % — ABNORMAL LOW (ref 36.0–46.0)
Hemoglobin: 7.3 g/dL — ABNORMAL LOW (ref 12.0–15.0)
MCH: 23.8 pg — ABNORMAL LOW (ref 26.0–34.0)
MCHC: 31.3 g/dL (ref 30.0–36.0)
MCV: 75.9 fL — ABNORMAL LOW (ref 78.0–100.0)
Platelets: 345 10*3/uL (ref 150–400)
RBC: 3.07 MIL/uL — ABNORMAL LOW (ref 3.87–5.11)
RDW: 16.6 % — ABNORMAL HIGH (ref 11.5–15.5)
WBC: 7.7 10*3/uL (ref 4.0–10.5)

## 2013-03-13 LAB — PREPARE RBC (CROSSMATCH)

## 2013-03-13 LAB — GLUCOSE, CAPILLARY
Glucose-Capillary: 82 mg/dL (ref 70–99)
Glucose-Capillary: 115 mg/dL — ABNORMAL HIGH (ref 70–99)
Glucose-Capillary: 95 mg/dL (ref 70–99)
Glucose-Capillary: 119 mg/dL — ABNORMAL HIGH (ref 70–99)

## 2013-03-13 LAB — PROTIME-INR
INR: 1.41 (ref 0.00–1.49)
Prothrombin Time: 16.9 seconds — ABNORMAL HIGH (ref 11.6–15.2)

## 2013-03-13 MED ORDER — WARFARIN SODIUM 2.5 MG PO TABS
12.5000 mg | ORAL_TABLET | Freq: Once | ORAL | Status: AC
  Administered 2013-03-13: 12.5 mg via ORAL
  Filled 2013-03-13 (×2): qty 1

## 2013-03-13 MED ORDER — FUROSEMIDE 10 MG/ML IJ SOLN
20.0000 mg | Freq: Once | INTRAMUSCULAR | Status: AC
  Administered 2013-03-13: 20 mg via INTRAVENOUS
  Filled 2013-03-13: qty 2

## 2013-03-13 MED ORDER — DIPHENHYDRAMINE HCL 50 MG/ML IJ SOLN: 25.0000 mg | Freq: Once | INTRAMUSCULAR | Status: DC

## 2013-03-13 MED ORDER — ACETAMINOPHEN 325 MG PO TABS: 650.0000 mg | ORAL_TABLET | Freq: Once | ORAL | Status: DC

## 2013-03-13 NOTE — Evaluation (Signed)
Occupational Therapy Evaluation Patient Details Name: [name] MRN: [id number] DOB: 19-Jul-1945 Today's Date: 03/13/2013 Time: [id number] OT Time Calculation (min): 19 min  OT Assessment / Plan / Recommendation History of present illness Patient is a 67 y/o female admitted for left TKA.   Clinical Impression   Pt presents with below problem list. Pt planning to d/c to SNF for further rehab. All further OT needs can be met in next venue of care.     OT Assessment  All further OT needs can be met in the next venue of care    Follow Up Recommendations  SNF;Supervision/Assistance - 24 hour    Barriers to Discharge Decreased caregiver support    Equipment Recommendations  Other (comment) (defer to SNF)    Recommendations for Other Services    Frequency       Precautions / Restrictions Precautions Precautions: Knee;Fall Precaution Comments: Reviewed precautions with pt Restrictions Weight Bearing Restrictions: Yes LLE Weight Bearing: Weight bearing as tolerated   Pertinent Vitals/Pain Pain 5/10. Repositioned.     ADL  Eating/Feeding: Independent Where Assessed - Eating/Feeding: Chair Grooming: Set up Where Assessed - Grooming: Supported sitting Upper Body Bathing: Set up Where Assessed - Upper Body Bathing: Supported sitting Lower Body Bathing: Minimal assistance Where Assessed - Lower Body Bathing: Supported sit to stand Upper Body Dressing: Set up Where Assessed - Upper Body Dressing: Supported sitting Lower Body Dressing: Minimal assistance Where Assessed - Lower Body Dressing: Supported sit to Lobbyist: Magazine features editor Method: Sit to Loss adjuster, chartered: Therapist, occupational and Hygiene: Minimal assistance Where Assessed - Best boy and Hygiene: Other (comment) (sit to stand from [facility]) Tub/Shower Transfer Method: Not assessed Equipment Used: Gait belt;Rolling  walker Transfers/Ambulation Related to ADLs: Min guard-knee buckling. Min A/Min guard for transfers. ADL Comments: Pt knee buckling with ambulation. Pt fatigued during session. OT explained benefit of reaching to don/doff left sock as it increases ROM in knee. Recommended having someone come pick up her rugs in her house to prevent falls for safety.     OT Diagnosis: Acute pain  OT Problem List: Decreased strength;Decreased activity tolerance;Impaired balance (sitting and/or standing);Decreased knowledge of use of DME or AE;Decreased knowledge of precautions;Pain OT Treatment Interventions:     OT Goals(Current goals can be found in the care plan section) Acute Rehab OT Goals Patient Stated Goal: to have less pain  Visit Information  Last OT Received On: 03/13/13 Assistance Needed: +1 History of Present Illness: Patient is a 67 y/o female admitted for left TKA.       Prior [place] expects to be discharged to:: Skilled nursing facility Prior Function Level of Independence: Independent with assistive device(s) Comments: walked with cane versus walker prior to admission Communication Communication: No difficulties Dominant Hand: Right         Vision/Perception     Cognition  Cognition Arousal/Alertness: Awake/alert Behavior During Therapy: WFL for tasks assessed/performed Overall Cognitive Status: Within Functional Limits for tasks assessed    Extremity/Trunk Assessment Upper Extremity Assessment Upper Extremity Assessment: Overall WFL for tasks assessed Lower Extremity Assessment Lower Extremity Assessment: Defer to PT evaluation     Mobility Bed Mobility Bed Mobility: Sit to Supine;Scooting to HOB Supine to Sit: 3: Mod assist Sitting - Scoot to Edge of Bed: 5: Supervision Details for Bed Mobility Assistance: Assist with getting LE's onto bed. Cues for technique. Transfers Transfers: Sit to Stand;Stand to  Sit Sit to Stand: 4:  Min assist;4: Min guard;With upper extremity assist;From bed;From chair/3-in-1 Stand to Sit: 4: Min guard;With upper extremity assist;To bed;To chair/3-in-1 Details for Transfer Assistance: Min A for sit to stand from recliner chair, otherwise Min guard level.         Balance     End of Session OT - End of Session Equipment Utilized During Treatment: Gait belt;Rolling walker Activity Tolerance: Patient limited by fatigue Patient left: in bed;with call bell/phone within reach;with family/visitor present  [place], Sanders L OTR/L [id number] 03/13/2013, 4:08 PM

## 2013-03-13 NOTE — Progress Notes (Signed)
PT Cancellation Note  Patient Details Name: [name] MRN: [id number] DOB: [birth year]   Cancelled Treatment:    Reason Eval/Treat Not Completed: Medical issues which prohibited therapy.  Pt with decreased blood glucose, decreased BP and decreased Hgb this AM.  Pt scheduled to receive 2 units PRBCs.  PT to check back this PM.   [name] 03/13/2013, 10:41 AM  [name], PT  Office # [phone] Pager [phone]

## 2013-03-13 NOTE — Progress Notes (Signed)
Physical Therapy Treatment Patient Details Name: [name] MRN: [id number] DOB: Dec 25, 1945 Today's Date: 03/13/2013 Time: [id number] PT Time Calculation (min): 24 min  PT Assessment / Plan / Recommendation  History of Present Illness     PT Comments   Pt receiving 2 units of PRBCs today.  PT Rx performed after first unit transfused.  Follow Up Recommendations  SNF     Does the patient have the potential to tolerate intense rehabilitation     Barriers to Discharge        Equipment Recommendations  3in1 (PT)    Recommendations for Other Services    Frequency 7X/week   Progress towards PT Goals Progress towards PT goals: Progressing toward goals  Plan Current plan remains appropriate    Precautions / Restrictions Precautions Precautions: Knee;Fall Restrictions LLE Weight Bearing: Weight bearing as tolerated   Pertinent Vitals/Pain 8/10    Mobility  Bed Mobility Supine to Sit: 4: Min assist Sitting - Scoot to Edge of Bed: 4: Min assist Details for Bed Mobility Assistance: assist with LLE and verbal cues for sequencing Transfers Sit to Stand: 4: Min assist;From bed;With upper extremity assist Stand to Sit: 4: Min assist;To chair/3-in-1;With armrests;With upper extremity assist Details for Transfer Assistance: verbal cues for sequencing, safety Ambulation/Gait Ambulation/Gait Assistance: 4: Min assist Ambulation Distance (Feet): 5 Feet Assistive device: Rolling walker Gait Pattern: Step-to pattern;Antalgic Gait velocity: decreased    Exercises Total Joint Exercises Ankle Circles/Pumps: AROM;Both;10 reps Quad Sets: AROM;Left;10 reps Heel Slides: AAROM;Left;10 reps Hip ABduction/ADduction: AAROM;Left;10 reps   PT Diagnosis:    PT Problem List:   PT Treatment Interventions:     PT Goals (current goals can now be found in the care plan section)    Visit Information  Last PT Received On: 03/13/13 Assistance Needed: +1 Reason Eval/Treat Not Completed:  Medical issues which prohibited therapy    Subjective Data      Cognition  Cognition Arousal/Alertness: Awake/alert Behavior During Therapy: WFL for tasks assessed/performed Overall Cognitive Status: Within Functional Limits for tasks assessed    Balance     End of Session PT - End of Session Equipment Utilized During Treatment: Gait belt Activity Tolerance: Patient limited by fatigue;Patient limited by pain Patient left: in chair;with call bell/phone within reach;with nursing/sitter in room Nurse Communication: Mobility status   GP     [name] 03/13/2013, 1:59 PM  [name], PT  Office # [phone] Pager [phone]

## 2013-03-13 NOTE — Progress Notes (Signed)
Nutrition Brief Note  Patient identified on the Malnutrition Screening Tool (MST) Report for recent weight lost without trying and eating poorly because of a decreased appetite.  Per readings below, patient has had a 13% weight loss x 1 year; not significant for time frame and desirable given obesity.  Wt Readings from Last 15 Encounters:  03/12/13 229 lb 9.6 oz (104.146 kg)  03/12/13 229 lb 9.6 oz (104.146 kg)  03/10/13 229 lb 9.6 oz (104.146 kg)  03/18/12 265 lb 1.9 oz (120.258 kg)  10/31/11 266 lb (120.657 kg)  08/17/11 267 lb 6.7 oz (121.3 kg)  02/25/11 285 lb (129.275 kg)  12/07/10 278 lb (126.1 kg)  11/02/10 283 lb 6.4 oz (128.549 kg)  09/18/10 282 lb (127.914 kg)  08/15/09 265 lb (120.203 kg)  11/18/06 312 lb (141.522 kg)    Body mass index is 33.89 kg/(m^2). Patient meets criteria for Obesity Class I based on current BMI.   Current diet order is Carbohydrate Modified Medium Calorie, patient is consuming approximately 75% of meals at this time. Labs and medications reviewed.   No nutrition interventions warranted at this time. If nutrition issues arise, please consult RD.   Arthur Holms, RD, LDN Pager #: [phone] After-Hours Pager #: [phone]

## 2013-03-13 NOTE — Progress Notes (Signed)
ANTICOAGULATION CONSULT NOTE - follow up Pharmacy Consult for Coumadin Indication: VTE prophylaxis and afib  Allergies  Allergen Reactions  . Codeine Hives and Nausea And Vomiting    Patient Measurements: Height: 5\' 9"  (175.3 cm) (from preadmit visit 03/10/13) Weight: 229 lb 9.6 oz (104.146 kg) (from preadmit visit 03/10/13) IBW/kg (Calculated) : 66.2   Vital Signs: Temp: 98.3 F (36.8 C) (10/11 0516) Temp src: Oral (10/11 0516) BP: 107/48 mmHg (10/11 0516) Pulse Rate: 75 (10/11 0516)  Labs:  Recent Labs  03/10/13 1502 03/12/13 1007 03/13/13 0500  HGB 9.4*  --  7.3*  HCT 30.3*  --  23.3*  PLT 483*  --  345  APTT 42* 36  --   LABPROT 19.4* 16.6* 16.9*  INR 1.69* 1.38 1.41  CREATININE 0.93  --  1.13*    Estimated Creatinine Clearance: 61.2 ml/min (by C-G formula based on Cr of 1.13).    Assessment: 67 y.o female s/p L TKA 03/12/13.  Prior to admit she is on chronic coumadin 10 mg daily for  h/o afib. She is to resume her coumadin for post op VTE prophylaxis and h/o afib. Her last dose of coumadin prior to admit was 03/07/13. Today the INR is 1.41 after 1 dose of coumadin 12.5 mg.  Ortho has also ordered bilateral SCDs and TED hose.  Preadmit visit 03/10/13 the Hgb was low at 9.4 and pltc = 483K.  Now her post-op Hg is 7.3 and pltc 345.    PTA coumadin dose:  10 mg daily (last taken 03/07/13).   Goal of Therapy:  INR 2-3   Plan:  1. Repeat Coumadin 12.5 mg x1 today 2. Daily PT/INR 3. F/u for education needs Eudelia Bunch, Pharm.D. [id number] 03/13/2013 8:27 AM

## 2013-03-13 NOTE — Progress Notes (Signed)
Patient ID: [name], female   DOB: [birth year], 67 y.o.   MRN: [id number]   Subjective:  Patient reports pain as moderate . "Low blood sugar" this AM feeling weak.No SOB, CP or calf pain.  Objective:   VITALS:   Filed Vitals:   03/12/13 1604 03/12/13 1734 03/12/13 2102 03/13/13 0516  BP: 136/66 110/56 117/55 107/48  Pulse: 84 76 68 75  Temp: 98.6 F (37 C)  97.7 F (36.5 C) 98.3 F (36.8 C)  TempSrc:   Oral Oral  Resp: 18  18 18   Height: 5\' 9"  (1.753 m)     Weight: 104.146 kg (229 lb 9.6 oz)     SpO2: 100%  98% 99%   Left leg: Neurovascular intact Sensation intact distally Intact pulses distally Dorsiflexion/Plantar flexion intact Incision: dressing C/D/I Compartment soft   Lab Results  Component Value Date   WBC 7.7 03/13/2013   HGB 7.3* 03/13/2013   HCT 23.3* 03/13/2013   MCV 75.9* 03/13/2013   PLT 345 03/13/2013     Assessment/Plan: 1 Day Post-Op   Up with therapy Hypotensive will hold BP meds this AM. Acute blood loss anemia secondary to surgery and anemia will transfuse 2 units of PRBC,s today. On chronic coumadin INR 1.41 Monitor BUN creatine and urine output closely.   Erskine Emery 03/13/2013, 8:27 AM [phone]

## 2013-03-14 LAB — GLUCOSE, CAPILLARY
Glucose-Capillary: 44 mg/dL — CL (ref 70–99)
Glucose-Capillary: 87 mg/dL (ref 70–99)
Glucose-Capillary: 93 mg/dL (ref 70–99)
Glucose-Capillary: 123 mg/dL — ABNORMAL HIGH (ref 70–99)
Glucose-Capillary: 126 mg/dL — ABNORMAL HIGH (ref 70–99)

## 2013-03-14 LAB — TYPE AND SCREEN
ABO/RH(D): B POS
Antibody Screen: NEGATIVE
Unit division: 0
Unit division: 0

## 2013-03-14 LAB — PROTIME-INR
INR: 1.86 — ABNORMAL HIGH (ref 0.00–1.49)
Prothrombin Time: 20.9 seconds — ABNORMAL HIGH (ref 11.6–15.2)

## 2013-03-14 LAB — CBC
HCT: 26.3 % — ABNORMAL LOW (ref 36.0–46.0)
Hemoglobin: 8.6 g/dL — ABNORMAL LOW (ref 12.0–15.0)
MCH: 24.4 pg — ABNORMAL LOW (ref 26.0–34.0)
MCHC: 32.7 g/dL (ref 30.0–36.0)
MCV: 74.7 fL — ABNORMAL LOW (ref 78.0–100.0)
Platelets: 293 10*3/uL (ref 150–400)
RBC: 3.52 MIL/uL — ABNORMAL LOW (ref 3.87–5.11)
RDW: 16.4 % — ABNORMAL HIGH (ref 11.5–15.5)
WBC: 9.7 10*3/uL (ref 4.0–10.5)

## 2013-03-14 MED ORDER — SODIUM CHLORIDE 0.9 % IJ SOLN: 3.0000 mL | INTRAMUSCULAR | Status: DC | PRN

## 2013-03-14 MED ORDER — SODIUM CHLORIDE 0.9 % IJ SOLN
3.0000 mL | Freq: Two times a day (BID) | INTRAMUSCULAR | Status: DC
  Administered 2013-03-14 – 2013-03-15 (×3): 3 mL via INTRAVENOUS

## 2013-03-14 MED ORDER — WARFARIN SODIUM 10 MG PO TABS
10.0000 mg | ORAL_TABLET | Freq: Once | ORAL | Status: AC
  Administered 2013-03-14: 10 mg via ORAL
  Filled 2013-03-14 (×2): qty 1

## 2013-03-14 NOTE — Progress Notes (Signed)
Clinical Social Work Department BRIEF PSYCHOSOCIAL ASSESSMENT 03/14/2013  Patient:  [name], [name]     Account Number:  [id number]     Admit date:  03/12/2013  Clinical Social Worker:  Rolinda Roan  Date/Time:  03/14/2013 02:49 PM  Referred by:  Physician  Date Referred:  03/14/2013 Referred for  SNF Placement   Other Referral:   Interview type:  Patient Other interview type:    PSYCHOSOCIAL DATA Living Status:  ALONE Admitted from facility:   Level of care:   Primary support name:  [name] [phone] Primary support relationship to patient:  CHILD, ADULT Degree of support available:   Very supporitve.    CURRENT CONCERNS  Other Concerns:    SOCIAL WORK ASSESSMENT / PLAN Clinical Social Worker (CSW) met with patient to discuss short term rehab at a SNF. Patient was agreeable to SNF search in [place] because her mother passed away there. Patient gave CSW permission to call her son [name]. CSW contacted [name] and her reported that [facility] is their first choice because it is close to his house. CSW spoke with patient about faxing out to [facility] because it would be close to her son. Patient was agreeable to fax out to [facility]. Patient reported that she wanted a private room if possible. CSW faxed out to facilities in [facility] and sent a message about private room as a preference.   Assessment/plan status:  Psychosocial Support/Ongoing Assessment of Needs Other assessment/ plan:   Information/referral to community resources:   CSW gave patient SNF list.    PATIENT'S/FAMILY'S RESPONSE TO PLAN OF CARE: Patient was agreeable to fax out to [place].

## 2013-03-14 NOTE — Progress Notes (Signed)
ANTICOAGULATION CONSULT NOTE - follow up Pharmacy Consult for Coumadin Indication: VTE prophylaxis and afib  Allergies  Allergen Reactions  . Codeine Hives and Nausea And Vomiting    Patient Measurements: Height: 5\' 9"  (175.3 cm) (from preadmit visit 03/10/13) Weight: 229 lb 9.6 oz (104.146 kg) (from preadmit visit 03/10/13) IBW/kg (Calculated) : 66.2   Vital Signs: Temp: 99.4 F (37.4 C) (10/11 2200) BP: 135/56 mmHg (10/11 2200) Pulse Rate: 88 (10/11 2200)  Labs:  Recent Labs  03/12/13 1007 03/13/13 0500 03/14/13 0438  HGB  --  7.3* 8.6*  HCT  --  23.3* 26.3*  PLT  --  345 293  APTT 36  --   --   LABPROT 16.6* 16.9* 20.9*  INR 1.38 1.41 1.86*  CREATININE  --  1.13*  --     Estimated Creatinine Clearance: 61.2 ml/min (by C-G formula based on Cr of 1.13).    Assessment: 67 y.o female s/p L TKA 03/12/13.  Prior to admit she is on chronic coumadin 10 mg daily for  h/o afib. She is to resume her coumadin for post op VTE prophylaxis and h/o afib. Her last dose of coumadin prior to admit was 03/07/13. Today the INR is 1.86 after 2 doses of coumadin 12.5 mg.  Ortho has also ordered bilateral SCDs and TED hose.  Preadmit visit 03/10/13 the Hgn was low at 9.4.  Now her Hg is up to 8.6 from 7.3 after 2 U PRBCs yesterday.  No bleeding reported.  Ms. Corban reports that Dr. Sharol Given found blood in her knee from the coumadin and she has contacted Dr. Kae Heller office and she is hoping to transition to xarelto from coumadin.    PTA coumadin dose:  10 mg daily (last taken 03/07/13).   Goal of Therapy:  INR 2-3   Plan:  1. Coumadin 10 mg po x 1 dose today ( home dose for afib) 2. Daily PT/INR 3. F/u for transition to xarelto after pt confers w/ Dr. Brooke Bonito, Pharm.D. [id number] 03/14/2013 7:21 AM

## 2013-03-14 NOTE — Progress Notes (Signed)
Subjective: 2 Days Post-Op Procedure(s) (LRB): TOTAL KNEE ARTHROPLASTY (Left) Patient reports pain as mild.  Responded well to transusion for acute on chronic anemia.  Working well with therapy.  Objective: Vital signs in last 24 hours: Temp:  [97.9 F (36.6 C)-99.5 F (37.5 C)] 99.4 F (37.4 C) (10/11 2200) Pulse Rate:  [71-89] 88 (10/11 2200) Resp:  [16-20] 16 (10/11 2200) BP: (96-135)/(44-66) 135/56 mmHg (10/11 2200) SpO2:  [92 %-100 %] 100 % (10/11 2200)  Intake/Output from previous day: 10/11 0701 - 10/12 0700 In: 2373.3 [P.O.:560; I.V.:513.3; Blood:1300] Out: -  Intake/Output this shift:     Recent Labs  03/13/13 0500 03/14/13 0438  HGB 7.3* 8.6*    Recent Labs  03/13/13 0500 03/14/13 0438  WBC 7.7 9.7  RBC 3.07* 3.52*  HCT 23.3* 26.3*  PLT 345 293    Recent Labs  03/13/13 0500  NA 141  K 3.9  CL 108  CO2 25  BUN 16  CREATININE 1.13*  GLUCOSE 102*  CALCIUM 8.1*    Recent Labs  03/13/13 0500 03/14/13 0438  INR 1.41 1.86*    Sensation intact distally Intact pulses distally Dorsiflexion/Plantar flexion intact Incision: dressing C/D/I Compartment soft  Assessment/Plan: 2 Days Post-Op Procedure(s) (LRB): TOTAL KNEE ARTHROPLASTY (Left) Up with therapy Plan for discharge tomorrow  Mcarthur Rossetti 03/14/2013, 8:51 AM

## 2013-03-14 NOTE — Progress Notes (Addendum)
Clinical Social Work Department CLINICAL SOCIAL WORK PLACEMENT NOTE 03/14/2013  Patient:  JOHNELLA, KOLIN  Account Number:  [id number] Admit date:  03/12/2013  Clinical Social Worker:  Blima Rich, Latanya Presser  Date/time:  03/14/2013 02:56 PM  Clinical Social Work is seeking post-discharge placement for this patient at the following level of care:   [facility]   (*CSW will update this form in Epic as items are completed)   03/14/2013  Patient/family provided with [place] Department of Clinical Social Work's list of facilities offering this level of care within the geographic area requested by the patient (or if unable, by the patient's family).  03/14/2013  Patient/family informed of their freedom to choose among providers that offer the needed level of care, that participate in Medicare, Medicaid or managed care program needed by the patient, have an available bed and are willing to accept the patient.  03/14/2013  Patient/family informed of [facility]' ownership interest in [facility] as well as of the fact that they are under no obligation to receive care at this facility.  PASARR submitted to EDS on 03/14/2013 PASARR number received from EDS on 03/15/2013  FL2 transmitted to all facilities in geographic area requested by pt/family on  03/14/2013 FL2 transmitted to all facilities within larger geographic area on   Patient informed that his/her managed care company has contracts with or will negotiate with  certain facilities, including the following:     Patient/family informed of bed offers received:  03/15/2013 Patient chooses bed at [facility] Physician recommends and patient chooses bed at    Patient to be transferred to  on  03/15/2013 Patient to be transferred to facility by private vehicle  The following physician request were entered in Epic:   Additional Comments: Patient was agreeable to SNF search in [place].

## 2013-03-14 NOTE — Progress Notes (Signed)
Physical Therapy Treatment Patient Details Name: [name] MRN: [id number] DOB: 27-Aug-1945 Today's Date: 03/14/2013 Time: [id number] PT Time Calculation (min): 29 min  PT Assessment / Plan / Recommendation  History of Present Illness     PT Comments   Pt progressing slowly.  Encouragement needed to increase gait distance.  Follow Up Recommendations  SNF;Supervision/Assistance - 24 hour     Does the patient have the potential to tolerate intense rehabilitation     Barriers to Discharge        Equipment Recommendations  3in1 (PT)    Recommendations for Other Services    Frequency 7X/week   Progress towards PT Goals Progress towards PT goals: Progressing toward goals  Plan Current plan remains appropriate    Precautions / Restrictions Precautions Precautions: Knee;Fall Restrictions LLE Weight Bearing: Weight bearing as tolerated   Pertinent Vitals/Pain     Mobility  Bed Mobility Supine to Sit: 4: Min assist;With rails;HOB elevated Sitting - Scoot to Edge of Bed: 4: Min assist Details for Bed Mobility Assistance: assist with LLE, verbal cues for sequencing Transfers Sit to Stand: 4: Min assist;From bed;With upper extremity assist Stand to Sit: 4: Min guard;With upper extremity assist;With armrests;To chair/3-in-1 Details for Transfer Assistance: verbal cues for hand placement Ambulation/Gait Ambulation/Gait Assistance: 4: Min assist Ambulation Distance (Feet): 15 Feet Assistive device: Rolling walker Ambulation/Gait Assistance Details: verbal cues for posture, sequencing and RW management Gait Pattern: Step-to pattern;Antalgic Gait velocity: decreased    Exercises Total Joint Exercises Ankle Circles/Pumps: AROM;Both;10 reps Quad Sets: AROM;Left;10 reps Short Arc QuadSinclair Little;Left;10 reps Heel Slides: AAROM;Left;10 reps Hip ABduction/ADduction: AAROM;Left;10 reps Goniometric ROM: 0-75 L knee AAROM in supine   PT Diagnosis:    PT Problem List:   PT  Treatment Interventions:     PT Goals (current goals can now be found in the care plan section)    Visit Information  Last PT Received On: 03/14/13 Assistance Needed: +1    Subjective Data      Cognition  Cognition Arousal/Alertness: Awake/alert Behavior During Therapy: WFL for tasks assessed/performed Overall Cognitive Status: Within Functional Limits for tasks assessed    Balance     End of Session PT - End of Session Equipment Utilized During Treatment: Gait belt Activity Tolerance: Patient tolerated treatment well Patient left: in chair;with call bell/phone within reach   GP     [name] 03/14/2013, 10:21 AM  [name], PT  Office # [phone] Pager [phone]

## 2013-03-15 LAB — CBC
HCT: 27.5 % — ABNORMAL LOW (ref 36.0–46.0)
Hemoglobin: 8.8 g/dL — ABNORMAL LOW (ref 12.0–15.0)
MCH: 24.2 pg — ABNORMAL LOW (ref 26.0–34.0)
MCHC: 32 g/dL (ref 30.0–36.0)
MCV: 75.5 fL — ABNORMAL LOW (ref 78.0–100.0)
Platelets: 354 10*3/uL (ref 150–400)
RBC: 3.64 MIL/uL — ABNORMAL LOW (ref 3.87–5.11)
RDW: 16.8 % — ABNORMAL HIGH (ref 11.5–15.5)
WBC: 12.6 10*3/uL — ABNORMAL HIGH (ref 4.0–10.5)

## 2013-03-15 LAB — GLUCOSE, CAPILLARY
Glucose-Capillary: 33 mg/dL — CL (ref 70–99)
Glucose-Capillary: 36 mg/dL — CL (ref 70–99)
Glucose-Capillary: 76 mg/dL (ref 70–99)
Glucose-Capillary: 116 mg/dL — ABNORMAL HIGH (ref 70–99)
Glucose-Capillary: 141 mg/dL — ABNORMAL HIGH (ref 70–99)

## 2013-03-15 LAB — PROTIME-INR
INR: 1.97 — ABNORMAL HIGH (ref 0.00–1.49)
Prothrombin Time: 21.8 seconds — ABNORMAL HIGH (ref 11.6–15.2)

## 2013-03-15 MED ORDER — INSULIN DETEMIR 100 UNIT/ML ~~LOC~~ SOLN
10.0000 [IU] | Freq: Every day | SUBCUTANEOUS | Status: DC
  Filled 2013-03-15: qty 0.1

## 2013-03-15 MED ORDER — INSULIN DETEMIR 100 UNIT/ML ~~LOC~~ SOLN: 10.0000 [IU] | Freq: Every day | SUBCUTANEOUS | Status: DC

## 2013-03-15 MED ORDER — HYDROCODONE-ACETAMINOPHEN 5-325 MG PO TABS: 1.0000 | ORAL_TABLET | Freq: Four times a day (QID) | ORAL | Status: DC | PRN

## 2013-03-15 MED ORDER — INSULIN DETEMIR 100 UNIT/ML FLEXPEN: 10.0000 [IU] | PEN_INJECTOR | Freq: Every day | SUBCUTANEOUS | Status: DC

## 2013-03-15 MED ORDER — OXYCODONE-ACETAMINOPHEN 5-325 MG PO TABS: 1.0000 | ORAL_TABLET | ORAL | Status: DC | PRN

## 2013-03-15 NOTE — Progress Notes (Addendum)
Inpatient Diabetes Program Recommendations  AACE/ADA: New Consensus Statement on Inpatient Glycemic Control (2013)  Target Ranges:  Prepandial:   less than 140 mg/dL      Peak postprandial:   less than 180 mg/dL (1-2 hours)      Critically ill patients:  140 - 180 mg/dL   Reason for Visit: Results for [name], [name] (MRN [id number]) as of 03/15/2013 10:53  Ref. Range 03/14/2013 21:17 03/15/2013 04:12 03/15/2013 04:25 03/15/2013 04:35 03/15/2013 05:34  Glucose-Capillary Latest Range: 70-99 mg/dL 126 (H) 33 (LL)  36 (LL) 76   Note that patient received Levemir 38 units last PM.  Please reduce Levemir to 20 units q HS to prevent hypoglycemia. Adah Perl, RN, BC-ADM Inpatient Diabetes Coordinator Pager [phone]     11:00am Called and discussed with RN.  Patient being discharged to [facility] today. She states that she will call MD to discuss.

## 2013-03-15 NOTE — Progress Notes (Signed)
Clinical social worker assisted with patient discharge to skilled nursing facility[facility].  CSW addressed all family questions and concerns. CSW copied chart and added all important documents. CSW also set up patient transportation with [employer] operational officer. Clinical Social Worker will sign off for now as social work intervention is no longer needed.   Rhea Pink, MSW, [place]

## 2013-03-15 NOTE — Progress Notes (Signed)
Physical Therapy Treatment Patient Details Name: [name] MRN: [id number] DOB: [birth year] Today's Date: 03/15/2013 Time: [id number] PT Time Calculation (min): 40 min  PT Assessment / Plan / Recommendation  History of Present Illness Patient is a 67 y/o female admitted for left TKA.   PT Comments   Pt progressing with PT goals & mobility.  Increased ambulation distance today.  Cont's to require encouragement throughout session but moves fairly well just slow.    Follow Up Recommendations  SNF;Supervision/Assistance - 24 hour     Does the patient have the potential to tolerate intense rehabilitation     Barriers to Discharge        Equipment Recommendations  3in1 (PT)    Recommendations for Other Services    Frequency 7X/week   Progress towards PT Goals Progress towards PT goals: Progressing toward goals  Plan Current plan remains appropriate    Precautions / Restrictions Precautions Precautions: Knee;Fall Restrictions LLE Weight Bearing: Weight bearing as tolerated   Pertinent Vitals/Pain 7/10 Lt knee.  RN notified.      Mobility  Bed Mobility Bed Mobility: Supine to Sit;Sitting - Scoot to Edge of Bed;Sit to Supine Supine to Sit: 4: Min guard;HOB flat;With rails Sitting - Scoot to Edge of Bed: 4: Min assist;With rail Sit to Supine: 4: Min assist;HOB flat Details for Bed Mobility Assistance: (A) to support LLE as she was scooting closer to EOB & (A) to lift LE's back into bed.  Cues for technique.   Transfers Transfers: Sit to Stand;Stand to Sit Sit to Stand: 4: Min guard;With upper extremity assist;From bed Stand to Sit: 4: Min guard;With upper extremity assist;To bed Details for Transfer Assistance: Guarding for safety.  Pt slightly unsteady with standing but able to gain balance/stability without physical (A) Ambulation/Gait Ambulation/Gait Assistance: 4: Min guard Ambulation Distance (Feet): 60 Feet Assistive device: Rolling walker Ambulation/Gait  Assistance Details: Cues to roll RW rather than pick up to advance, cues for body positioning inside RW, & cues to relax UE's.   Pt becomes slightly irritated with cues therefore limited amount of cueing Gait Pattern: Step-to pattern;Decreased weight shift to left Gait velocity: decreased General Gait Details: relies heavily on RW with UE's Stairs: No Wheelchair Mobility Wheelchair Mobility: No    Exercises Total Joint Exercises Ankle Circles/Pumps: AROM;Both;15 reps Quad Sets: AROM;Both (12 reps) Heel Slides: AAROM;Strengthening;Left;Supine (12 reps) Hip ABduction/ADduction: AAROM;Strengthening;Left;Supine (12 reps) Straight Leg Raises: AAROM;Strengthening;Left;Supine (12 reps)   PT Diagnosis:    PT Problem List:   PT Treatment Interventions:     PT Goals (current goals can now be found in the care plan section) Acute Rehab PT Goals PT Goal Formulation: With patient Time For Goal Achievement: 03/26/13 Potential to Achieve Goals: Good  Visit Information  Last PT Received On: 03/15/13 Assistance Needed: +1 History of Present Illness: Patient is a 67 y/o female admitted for left TKA.    Subjective Data      Cognition  Cognition Arousal/Alertness: Awake/alert Behavior During Therapy: WFL for tasks assessed/performed Overall Cognitive Status: Within Functional Limits for tasks assessed    Balance     End of Session PT - End of Session Equipment Utilized During Treatment: Gait belt Activity Tolerance: Patient tolerated treatment well Patient left: in bed;with call bell/phone within reach Nurse Communication: Mobility status;Patient requests pain meds   GP     Sena Hitch 03/15/2013, 8:41 AM   Sarajane Marek, PTA [phone] 03/15/2013

## 2013-03-15 NOTE — Discharge Summary (Signed)
Physician Discharge Summary  Patient ID: [name] MRN: [id number] DOB/AGE: 1946/04/21 67 y.o.  Admit date: 03/12/2013 Discharge date: 03/15/2013  Admission Diagnoses: Osteoarthritis left knee  Discharge Diagnoses: Osteoarthritis left knee Active Problems:   * No active hospital problems. *   Discharged Condition: stable  Hospital Course: Patient's hospital course was essentially unremarkable. She underwent total knee arthroplasty. Postoperatively patient progressed slowly with therapy and was discharged to skilled nursing in stable condition.  Consults: None  Significant Diagnostic Studies: labs: Routine labs  Treatments: surgery: See operative note  Discharge Exam: Blood pressure 117/60, pulse 77, temperature 98 F (36.7 C), temperature source Oral, resp. rate 18, height 5\' 9"  (1.753 m), weight 104.146 kg (229 lb 9.6 oz), SpO2 100.00%. Incision/Wound: dressing clean dry and intact  Disposition: 01-Home or Self Care  Discharge Orders   Future Orders Complete By Expires   Call MD / Call 911  As directed    Comments:     If you experience chest pain or shortness of breath, CALL 911 and be transported to the hospital emergency room.  If you develope a fever above 101 F, pus (white drainage) or increased drainage or redness at the wound, or calf pain, call your surgeon's office.   Constipation Prevention  As directed    Comments:     Drink plenty of fluids.  Prune juice may be helpful.  You may use a stool softener, such as Colace (over the counter) 100 mg twice a day.  Use MiraLax (over the counter) for constipation as needed.   Diet - low sodium heart healthy  As directed    Increase activity slowly as tolerated  As directed    Weight bearing as tolerated  As directed    Questions:     Laterality:     Extremity:         Medication List         acetaminophen 500 MG tablet  Commonly known as:  TYLENOL  Take 1,000 mg by mouth 3 (three) times daily as needed for  pain.     allopurinol 100 MG tablet  Commonly known as:  ZYLOPRIM  Take 100 mg by mouth 2 (two) times daily.     carvedilol 12.5 MG tablet  Commonly known as:  COREG  Take 12.5 mg by mouth 2 (two) times daily with a meal.     colchicine 0.6 MG tablet  Take 0.6 mg by mouth 2 (two) times daily.     Desoximetasone 0.25 % ointment  Commonly known as:  TOPICORT  Apply 1 application topically daily. Apply to face and neck     enalapril 10 MG tablet  Commonly known as:  VASOTEC  Take 1 tablet (10 mg total) by mouth 2 (two) times daily.     HYDROcodone-acetaminophen 10-325 MG per tablet  Commonly known as:  NORCO  Take 1 tablet by mouth every 8 (eight) hours as needed for pain.     HYDROcodone-acetaminophen 5-325 MG per tablet  Commonly known as:  NORCO  Take 1 tablet by mouth every 6 (six) hours as needed for pain.     lansoprazole 15 MG capsule  Commonly known as:  PREVACID  Take 15 mg by mouth daily.     LEVEMIR FLEXPEN 100 UNIT/ML Sopn  Generic drug:  Insulin Detemir  Inject 38 Units into the skin at bedtime.     multivitamin with minerals Tabs tablet  Take 1 tablet by mouth daily. Centrum Silver  oxyCODONE-acetaminophen 5-325 MG per tablet  Commonly known as:  ROXICET  Take 1 tablet by mouth every 4 (four) hours as needed for pain.     VICTOZA 18 MG/3ML Soln injection  Generic drug:  Liraglutide  Inject 0.6 mg into the skin daily after supper. 4pm     warfarin 5 MG tablet  Commonly known as:  COUMADIN  Take 10 mg by mouth at bedtime.           Follow-up Information   Follow up with DUDA,MARCUS V, MD In 2 weeks.   Specialty:  Orthopedic Surgery   Contact information:   [place] [address] [phone]       Signed: Newt Minion 03/15/2013, 6:34 AM

## 2013-03-15 NOTE — Progress Notes (Signed)
Patient ID: [name], female   DOB: September 11, 1945, 67 y.o.   MRN: [id number] F. L2 signed paperwork complete for discharge to skilled nursing.

## 2013-03-16 ENCOUNTER — Encounter (HOSPITAL_COMMUNITY): Payer: Self-pay | Admitting: Orthopedic Surgery

## 2013-04-13 ENCOUNTER — Ambulatory Visit (INDEPENDENT_AMBULATORY_CARE_PROVIDER_SITE_OTHER): Payer: Medicare Other | Admitting: Pharmacist

## 2013-04-13 DIAGNOSIS — I4891 Unspecified atrial fibrillation: Secondary | ICD-10-CM

## 2013-04-13 DIAGNOSIS — Z7901 Long term (current) use of anticoagulants: Secondary | ICD-10-CM

## 2013-04-13 LAB — POCT INR: INR: 1.1

## 2013-04-13 MED ORDER — WARFARIN SODIUM 5 MG PO TABS: 10.0000 mg | ORAL_TABLET | Freq: Every day | ORAL | Status: DC

## 2013-04-19 ENCOUNTER — Ambulatory Visit (INDEPENDENT_AMBULATORY_CARE_PROVIDER_SITE_OTHER): Payer: Medicare Other | Admitting: Cardiology

## 2013-04-19 DIAGNOSIS — Z7901 Long term (current) use of anticoagulants: Secondary | ICD-10-CM

## 2013-04-19 DIAGNOSIS — I4891 Unspecified atrial fibrillation: Secondary | ICD-10-CM

## 2013-04-19 LAB — POCT INR: INR: 2.1

## 2013-04-26 ENCOUNTER — Ambulatory Visit (INDEPENDENT_AMBULATORY_CARE_PROVIDER_SITE_OTHER): Payer: Medicare Other | Admitting: Cardiovascular Disease

## 2013-04-26 DIAGNOSIS — Z7901 Long term (current) use of anticoagulants: Secondary | ICD-10-CM

## 2013-04-26 DIAGNOSIS — I4891 Unspecified atrial fibrillation: Secondary | ICD-10-CM

## 2013-04-26 LAB — POCT INR: INR: 2.4

## 2013-05-07 ENCOUNTER — Other Ambulatory Visit: Payer: Self-pay | Admitting: Cardiology

## 2013-05-12 ENCOUNTER — Other Ambulatory Visit: Payer: Self-pay | Admitting: *Deleted

## 2013-05-12 MED ORDER — WARFARIN SODIUM 5 MG PO TABS: ORAL_TABLET | ORAL | Status: DC

## 2013-05-15 ENCOUNTER — Encounter (HOSPITAL_COMMUNITY): Payer: Self-pay | Admitting: Emergency Medicine

## 2013-05-15 ENCOUNTER — Inpatient Hospital Stay (HOSPITAL_COMMUNITY)
Admission: EM | Admit: 2013-05-15 | Discharge: 2013-05-19 | DRG: 377 | Disposition: A | Discharge status: Home / Self Care | Payer: Medicare Other | Attending: Internal Medicine | Admitting: Internal Medicine

## 2013-05-15 DIAGNOSIS — R531 Weakness: Secondary | ICD-10-CM

## 2013-05-15 DIAGNOSIS — E663 Overweight: Secondary | ICD-10-CM

## 2013-05-15 DIAGNOSIS — Z6833 Body mass index (BMI) 33.0-33.9, adult: Secondary | ICD-10-CM

## 2013-05-15 DIAGNOSIS — R7989 Other specified abnormal findings of blood chemistry: Secondary | ICD-10-CM | POA: Diagnosis present

## 2013-05-15 DIAGNOSIS — E43 Unspecified severe protein-calorie malnutrition: Secondary | ICD-10-CM | POA: Insufficient documentation

## 2013-05-15 DIAGNOSIS — I959 Hypotension, unspecified: Secondary | ICD-10-CM | POA: Diagnosis present

## 2013-05-15 DIAGNOSIS — Z8249 Family history of ischemic heart disease and other diseases of the circulatory system: Secondary | ICD-10-CM

## 2013-05-15 DIAGNOSIS — I340 Nonrheumatic mitral (valve) insufficiency: Secondary | ICD-10-CM | POA: Diagnosis present

## 2013-05-15 DIAGNOSIS — Z79899 Other long term (current) drug therapy: Secondary | ICD-10-CM

## 2013-05-15 DIAGNOSIS — D500 Iron deficiency anemia secondary to blood loss (chronic): Secondary | ICD-10-CM | POA: Diagnosis present

## 2013-05-15 DIAGNOSIS — I509 Heart failure, unspecified: Secondary | ICD-10-CM | POA: Diagnosis present

## 2013-05-15 DIAGNOSIS — Z800 Family history of malignant neoplasm of digestive organs: Secondary | ICD-10-CM

## 2013-05-15 DIAGNOSIS — N179 Acute kidney failure, unspecified: Secondary | ICD-10-CM

## 2013-05-15 DIAGNOSIS — F329 Major depressive disorder, single episode, unspecified: Secondary | ICD-10-CM | POA: Diagnosis present

## 2013-05-15 DIAGNOSIS — M109 Gout, unspecified: Secondary | ICD-10-CM | POA: Diagnosis present

## 2013-05-15 DIAGNOSIS — R21 Rash and other nonspecific skin eruption: Secondary | ICD-10-CM

## 2013-05-15 DIAGNOSIS — M1710 Unilateral primary osteoarthritis, unspecified knee: Secondary | ICD-10-CM | POA: Diagnosis present

## 2013-05-15 DIAGNOSIS — F3289 Other specified depressive episodes: Secondary | ICD-10-CM

## 2013-05-15 DIAGNOSIS — L0292 Furuncle, unspecified: Secondary | ICD-10-CM

## 2013-05-15 DIAGNOSIS — I071 Rheumatic tricuspid insufficiency: Secondary | ICD-10-CM | POA: Diagnosis present

## 2013-05-15 DIAGNOSIS — I4891 Unspecified atrial fibrillation: Secondary | ICD-10-CM

## 2013-05-15 DIAGNOSIS — I472 Ventricular tachycardia, unspecified: Secondary | ICD-10-CM | POA: Diagnosis not present

## 2013-05-15 DIAGNOSIS — F17200 Nicotine dependence, unspecified, uncomplicated: Secondary | ICD-10-CM | POA: Diagnosis present

## 2013-05-15 DIAGNOSIS — I428 Other cardiomyopathies: Secondary | ICD-10-CM | POA: Diagnosis present

## 2013-05-15 DIAGNOSIS — N182 Chronic kidney disease, stage 2 (mild): Secondary | ICD-10-CM | POA: Diagnosis present

## 2013-05-15 DIAGNOSIS — Z8679 Personal history of other diseases of the circulatory system: Secondary | ICD-10-CM

## 2013-05-15 DIAGNOSIS — J40 Bronchitis, not specified as acute or chronic: Secondary | ICD-10-CM

## 2013-05-15 DIAGNOSIS — IMO0002 Reserved for concepts with insufficient information to code with codable children: Secondary | ICD-10-CM

## 2013-05-15 DIAGNOSIS — I059 Rheumatic mitral valve disease, unspecified: Secondary | ICD-10-CM | POA: Diagnosis present

## 2013-05-15 DIAGNOSIS — E875 Hyperkalemia: Secondary | ICD-10-CM

## 2013-05-15 DIAGNOSIS — I129 Hypertensive chronic kidney disease with stage 1 through stage 4 chronic kidney disease, or unspecified chronic kidney disease: Secondary | ICD-10-CM | POA: Diagnosis present

## 2013-05-15 DIAGNOSIS — E119 Type 2 diabetes mellitus without complications: Secondary | ICD-10-CM | POA: Diagnosis present

## 2013-05-15 DIAGNOSIS — Z885 Allergy status to narcotic agent status: Secondary | ICD-10-CM

## 2013-05-15 DIAGNOSIS — I480 Paroxysmal atrial fibrillation: Secondary | ICD-10-CM | POA: Diagnosis present

## 2013-05-15 DIAGNOSIS — E860 Dehydration: Secondary | ICD-10-CM

## 2013-05-15 DIAGNOSIS — I4729 Other ventricular tachycardia: Secondary | ICD-10-CM | POA: Diagnosis not present

## 2013-05-15 DIAGNOSIS — I429 Cardiomyopathy, unspecified: Secondary | ICD-10-CM

## 2013-05-15 DIAGNOSIS — K5731 Diverticulosis of large intestine without perforation or abscess with bleeding: Principal | ICD-10-CM | POA: Diagnosis present

## 2013-05-15 DIAGNOSIS — E079 Disorder of thyroid, unspecified: Secondary | ICD-10-CM | POA: Diagnosis present

## 2013-05-15 DIAGNOSIS — Z96659 Presence of unspecified artificial knee joint: Secondary | ICD-10-CM

## 2013-05-15 DIAGNOSIS — J438 Other emphysema: Secondary | ICD-10-CM

## 2013-05-15 DIAGNOSIS — Z9189 Other specified personal risk factors, not elsewhere classified: Secondary | ICD-10-CM

## 2013-05-15 DIAGNOSIS — Z720 Tobacco use: Secondary | ICD-10-CM

## 2013-05-15 DIAGNOSIS — K922 Gastrointestinal hemorrhage, unspecified: Secondary | ICD-10-CM | POA: Diagnosis present

## 2013-05-15 DIAGNOSIS — R634 Abnormal weight loss: Secondary | ICD-10-CM | POA: Diagnosis present

## 2013-05-15 DIAGNOSIS — N189 Chronic kidney disease, unspecified: Secondary | ICD-10-CM | POA: Diagnosis present

## 2013-05-15 DIAGNOSIS — K219 Gastro-esophageal reflux disease without esophagitis: Secondary | ICD-10-CM

## 2013-05-15 DIAGNOSIS — E785 Hyperlipidemia, unspecified: Secondary | ICD-10-CM

## 2013-05-15 DIAGNOSIS — I079 Rheumatic tricuspid valve disease, unspecified: Secondary | ICD-10-CM | POA: Diagnosis present

## 2013-05-15 DIAGNOSIS — E1129 Type 2 diabetes mellitus with other diabetic kidney complication: Secondary | ICD-10-CM

## 2013-05-15 DIAGNOSIS — Z7901 Long term (current) use of anticoagulants: Secondary | ICD-10-CM

## 2013-05-15 LAB — CBC WITH DIFFERENTIAL/PLATELET
Basophils Absolute: 0.1 10*3/uL (ref 0.0–0.1)
Basophils Relative: 1 % (ref 0–1)
Eosinophils Absolute: 0.2 10*3/uL (ref 0.0–0.7)
Eosinophils Relative: 2 % (ref 0–5)
HCT: 30.1 % — ABNORMAL LOW (ref 36.0–46.0)
Hemoglobin: 9.4 g/dL — ABNORMAL LOW (ref 12.0–15.0)
Lymphocytes Relative: 25 % (ref 12–46)
Lymphs Abs: 2.3 10*3/uL (ref 0.7–4.0)
MCH: 23.9 pg — ABNORMAL LOW (ref 26.0–34.0)
MCHC: 31.2 g/dL (ref 30.0–36.0)
MCV: 76.4 fL — ABNORMAL LOW (ref 78.0–100.0)
Monocytes Absolute: 0.4 10*3/uL (ref 0.1–1.0)
Monocytes Relative: 4 % (ref 3–12)
Neutro Abs: 6.4 10*3/uL (ref 1.7–7.7)
Neutrophils Relative %: 70 % (ref 43–77)
Platelets: 247 10*3/uL (ref 150–400)
RBC: 3.94 MIL/uL (ref 3.87–5.11)
RDW: 18.2 % — ABNORMAL HIGH (ref 11.5–15.5)
WBC: 9.3 10*3/uL (ref 4.0–10.5)

## 2013-05-15 LAB — COMPREHENSIVE METABOLIC PANEL
ALT: 19 U/L (ref 0–35)
AST: 18 U/L (ref 0–37)
Albumin: 3.1 g/dL — ABNORMAL LOW (ref 3.5–5.2)
Alkaline Phosphatase: 69 U/L (ref 39–117)
BUN: 30 mg/dL — ABNORMAL HIGH (ref 6–23)
CO2: 22 mEq/L (ref 19–32)
Calcium: 9.2 mg/dL (ref 8.4–10.5)
Chloride: 101 mEq/L (ref 96–112)
Creatinine, Ser: 1.59 mg/dL — ABNORMAL HIGH (ref 0.50–1.10)
GFR calc Af Amer: 37 mL/min — ABNORMAL LOW (ref >90)
GFR calc non Af Amer: 32 mL/min — ABNORMAL LOW (ref >90)
Glucose, Bld: 134 mg/dL — ABNORMAL HIGH (ref 70–99)
Potassium: 4.5 mEq/L (ref 3.5–5.1)
Sodium: 134 mEq/L — ABNORMAL LOW (ref 135–145)
Total Bilirubin: 0.2 mg/dL — ABNORMAL LOW (ref 0.3–1.2)
Total Protein: 6.3 g/dL (ref 6.0–8.3)

## 2013-05-15 LAB — CG4 I-STAT (LACTIC ACID): Lactic Acid, Venous: 1.83 mmol/L (ref 0.5–2.2)

## 2013-05-15 LAB — TROPONIN I: Troponin I: <0.3 ng/mL (ref <0.30)

## 2013-05-15 LAB — PROTIME-INR
INR: 2.57 — ABNORMAL HIGH (ref 0.00–1.49)
Prothrombin Time: 26.7 seconds — ABNORMAL HIGH (ref 11.6–15.2)

## 2013-05-15 LAB — OCCULT BLOOD, POC DEVICE: Fecal Occult Bld: POSITIVE — AB

## 2013-05-15 MED ORDER — SODIUM CHLORIDE 0.9 % IV SOLN
500.0000 mL | Freq: Once | INTRAVENOUS | Status: AC
  Administered 2013-05-15: 500 mL via INTRAVENOUS

## 2013-05-15 NOTE — ED Notes (Signed)
Pt here for possible GI Bleed, pt is symptomatic with weakness, cold intolerance, and hypotension of 87/55. BP now 94/66 after 500cc bolus per protocol.

## 2013-05-15 NOTE — ED Provider Notes (Signed)
CSN: [id number]     Arrival date & time 05/15/13  2212 History   First MD Initiated Contact with Patient 05/15/13 2258     Chief Complaint  Patient presents with  . Rectal Bleeding  . Weakness   (Consider location/radiation/quality/duration/timing/severity/associated sxs/prior Treatment) The history is provided by the patient. No language interpreter was used.  [name] is a 67 year old female with past nuchal history of hyperkalemia, congestive heart failure, hyperlipidemia, depression, A. fib currently on Coumadin, gout, diabetes, chronic kidney disease presenting to emergency department with rectal bleeding that started yesterday. Patient reports that with each bowel movement she's been having bright red bloody stools. Patient reports that she gets this "funny sensation" in her abdomen followed by a bloody bowel movement. Patient reports that when she had a bowel movement today the toilet was full bright red blood. Patient reports she's been feeling dizzy, reports room spinning sensation. Patient reported that she had the sensation of fainting, denied syncope. Reported that she has abdominal pain, described as "growling"-states feels as if she needs to go to the bathroom, cramping sensation. Patient reports she's been feeling extremely weak, reported that her sister had to help her get dressed this morning. Patient reports she has similar episode approximately 2 years ago, reported that she had a colonoscopy approximately 2 years ago negative findings. Denied fainting, chest pain, shortness of breath, difficulty breathing, numbness, tingling. PCP Dr. Baird Cancer  Past Medical History  Diagnosis Date  . Hyperkalemia   . Overweight(278.02)   . CHF (congestive heart failure)   . HLD (hyperlipidemia)   . Depression   . History of emphysema   . Atrial fibrillation     Cardioverted to NSR 08/22/2009  . CKD (chronic kidney disease)   . Mitral regurgitation     mild - echo - 1/11 /  Mild,  echo, May15, 2012  . Tricuspid regurgitation     moderate - echo - 1/11  . Drug therapy 1/11    coumadin  . Thyroid dysfunction     thyromegally,diffuse,nodule LLL,06/2009  . Ejection fraction < 50%     20-25%, echo.06/2009, /  EF 30-35%, diffuse hypokinesis, echo, Oct 16, 2010  . LFT elevation     06/2009  . Cardiomyopathy   . Tobacco abuse   . Hypertension   . GERD (gastroesophageal reflux disease)   . Dizziness   . Gout     "left knee" (03/12/2013)  . Pneumonia     "@ least twice" (03/12/2013)  . History of bronchitis   . DM2 (diabetes mellitus, type 2)     fasting 80-100 (03/12/2013)  . Arthritis     "left knee" (03/12/2013)   Past Surgical History  Procedure Laterality Date  . Total knee arthroplasty Left 03/12/2013  . Joint replacement    . Tonsillectomy  1960's  . Cholecystectomy  ~ 1978  . Vaginal hysterectomy  1987  . Tubal ligation    . Cataract extraction w/ intraocular lens  implant, bilateral Bilateral 2010-2012  . Breast biopsy Right 1980's  . Breast lumpectomy Right 1980's  . Total knee arthroplasty Left 03/12/2013    Procedure: TOTAL KNEE ARTHROPLASTY;  Surgeon: Newt Minion, MD;  Location: [facility];  Service: Orthopedics;  Laterality: Left;  Left total knee arthroplasty   Family History  Problem Relation Age of Onset  . Colon cancer    . Heart disease     History  Substance Use Topics  . Smoking status: Current Every Day Smoker -- 0.50  packs/day for 41 years    Types: Cigarettes  . Smokeless tobacco: Never Used  . Alcohol Use: No   OB History   Grav Para Term Preterm Abortions TAB SAB Ect Mult Living                 Review of Systems  Constitutional: Positive for chills. Negative for fever.  Respiratory: Negative for shortness of breath.   Cardiovascular: Negative for chest pain.  Gastrointestinal: Positive for abdominal pain and blood in stool. Negative for nausea and vomiting.  Neurological: Positive for dizziness and weakness. Negative for  syncope and numbness.  All other systems reviewed and are negative.    Allergies  Codeine  Home Medications   Current Outpatient Rx  Name  Route  Sig  Dispense  Refill  . acetaminophen (TYLENOL) 500 MG tablet   Oral   Take 1,000 mg by mouth 3 (three) times daily as needed for pain.         Marland Kitchen allopurinol (ZYLOPRIM) 100 MG tablet   Oral   Take 100 mg by mouth 2 (two) times daily as needed.          . carvedilol (COREG) 12.5 MG tablet   Oral   Take 12.5 mg by mouth 2 (two) times daily with a meal.         . colchicine 0.6 MG tablet   Oral   Take 0.6 mg by mouth 2 (two) times daily as needed.          . Desoximetasone (TOPICORT) 0.25 % ointment   Topical   Apply 1 application topically daily. Apply to face and neck         . enalapril (VASOTEC) 10 MG tablet      TAKE 1 TABLET (10 MG TOTAL) BY MOUTH 2 (TWO) TIMES DAILY.   60 tablet   0     PATIENT NEEDS APPOINTMENT FOR FURTHER REFILLS   . HYDROcodone-acetaminophen (NORCO) 10-325 MG per tablet   Oral   Take 1 tablet by mouth every 8 (eight) hours as needed for pain.         Marland Kitchen insulin detemir (LEVEMIR) 100 unit/ml SOLN   Subcutaneous   Inject 38 Units into the skin at bedtime.         . lansoprazole (PREVACID) 15 MG capsule   Oral   Take 15 mg by mouth daily.         . Liraglutide (VICTOZA) 18 MG/3ML SOLN   Subcutaneous   Inject 0.6 mg into the skin daily after supper. 4pm         . Multiple Vitamin (MULITIVITAMIN WITH MINERALS) TABS   Oral   Take 1 tablet by mouth daily. Centrum Silver         . oxyCODONE-acetaminophen (ROXICET) 5-325 MG per tablet   Oral   Take 1 tablet by mouth every 4 (four) hours as needed for pain.   30 tablet   0   . warfarin (COUMADIN) 10 MG tablet   Oral   Take 10 mg by mouth daily.          BP 94/66  Pulse 84  Temp(Src) 97.6 F (36.4 C) (Oral)  Resp 20  Ht 5\' 9"  (1.753 m)  Wt 227 lb (102.967 kg)  BMI 33.51 kg/m2  SpO2 100% Physical Exam  Nursing  note and vitals reviewed. Constitutional: She is oriented to person, place, and time. She appears well-developed and well-nourished. No distress.  HENT:  Head: Normocephalic  and atraumatic.  Mouth/Throat: Oropharynx is clear and moist. No oropharyngeal exudate.  Eyes: Conjunctivae and EOM are normal. Pupils are equal, round, and reactive to light. Right eye exhibits no discharge. Left eye exhibits no discharge. No scleral icterus.  Negative nystagmus  Neck: Normal range of motion. Neck supple.  Cardiovascular: Normal rate, regular rhythm and normal heart sounds.  Exam reveals no friction rub.   No murmur heard. Pulses:      Radial pulses are 2+ on the right side, and 2+ on the left side.       Dorsalis pedis pulses are 2+ on the right side, and 2+ on the left side.  Negative tachycardia identified  Pulmonary/Chest: Effort normal and breath sounds normal. No respiratory distress. She has no wheezes. She has no rales.  Abdominal: Bowel sounds are normal. There is no tenderness. There is no guarding.  Genitourinary:  Rectal exam: Negative swelling, erythema, inflammation, lesions, sores, hemorrhoids noted to the anus. Negative signs of fissures identified. Sphincter tone strong. Negative masses palpated. Negative pain upon palpation or pain noted during exam. Bright red blood noted on glove. Exam chaperoned  Musculoskeletal: Normal range of motion.  Neurological: She is alert and oriented to person, place, and time. She exhibits normal muscle tone. Coordination normal.  Skin: Skin is warm and dry. No rash noted. She is not diaphoretic. No erythema.  Psychiatric: She has a normal mood and affect. Her behavior is normal. Thought content normal.    ED Course  Procedures (including critical care time)  12:58 AM This provider spoke with Dr. Susanne Borders regarding history, case, presentation and labs. Dr. Susanne Borders to assess and admit patient.   Results for orders placed during the hospital encounter of  05/15/13  CBC WITH DIFFERENTIAL      Result Value Range   WBC 9.3  4.0 - 10.5 K/uL   RBC 3.94  3.87 - 5.11 MIL/uL   Hemoglobin 9.4 (*) 12.0 - 15.0 g/dL   HCT 30.1 (*) 36.0 - 46.0 %   MCV 76.4 (*) 78.0 - 100.0 fL   MCH 23.9 (*) 26.0 - 34.0 pg   MCHC 31.2  30.0 - 36.0 g/dL   RDW 18.2 (*) 11.5 - 15.5 %   Platelets 247  150 - 400 K/uL   Neutrophils Relative % 70  43 - 77 %   Neutro Abs 6.4  1.7 - 7.7 K/uL   Lymphocytes Relative 25  12 - 46 %   Lymphs Abs 2.3  0.7 - 4.0 K/uL   Monocytes Relative 4  3 - 12 %   Monocytes Absolute 0.4  0.1 - 1.0 K/uL   Eosinophils Relative 2  0 - 5 %   Eosinophils Absolute 0.2  0.0 - 0.7 K/uL   Basophils Relative 1  0 - 1 %   Basophils Absolute 0.1  0.0 - 0.1 K/uL  COMPREHENSIVE METABOLIC PANEL      Result Value Range   Sodium 134 (*) 135 - 145 mEq/L   Potassium 4.5  3.5 - 5.1 mEq/L   Chloride 101  96 - 112 mEq/L   CO2 22  19 - 32 mEq/L   Glucose, Bld 134 (*) 70 - 99 mg/dL   BUN 30 (*) 6 - 23 mg/dL   Creatinine, Ser 1.59 (*) 0.50 - 1.10 mg/dL   Calcium 9.2  8.4 - 10.5 mg/dL   Total Protein 6.3  6.0 - 8.3 g/dL   Albumin 3.1 (*) 3.5 - 5.2 g/dL  AST 18  0 - 37 U/L   ALT 19  0 - 35 U/L   Alkaline Phosphatase 69  39 - 117 U/L   Total Bilirubin 0.2 (*) 0.3 - 1.2 mg/dL   GFR calc non Af Amer 32 (*) >90 mL/min   GFR calc Af Amer 37 (*) >90 mL/min  PROTIME-INR      Result Value Range   Prothrombin Time 26.7 (*) 11.6 - 15.2 seconds   INR 2.57 (*) 0.00 - 1.49  TROPONIN I      Result Value Range   Troponin I <0.30  <0.30 ng/mL  LIPASE, BLOOD      Result Value Range   Lipase 19  11 - 59 U/L  OCCULT BLOOD, POC DEVICE      Result Value Range   Fecal Occult Bld POSITIVE (*) NEGATIVE  CG4 I-STAT (LACTIC ACID)      Result Value Range   Lactic Acid, Venous 1.83  0.5 - 2.2 mmol/L  TYPE AND SCREEN      Result Value Range   ABO/RH(D) B POS     Antibody Screen NEG     Sample Expiration 05/18/2013      Labs Review Labs Reviewed  CBC WITH  DIFFERENTIAL - Abnormal; Notable for the following:    Hemoglobin 9.4 (*)    HCT 30.1 (*)    MCV 76.4 (*)    MCH 23.9 (*)    RDW 18.2 (*)    All other components within normal limits  COMPREHENSIVE METABOLIC PANEL - Abnormal; Notable for the following:    Sodium 134 (*)    Glucose, Bld 134 (*)    BUN 30 (*)    Creatinine, Ser 1.59 (*)    Albumin 3.1 (*)    Total Bilirubin 0.2 (*)    GFR calc non Af Amer 32 (*)    GFR calc Af Amer 37 (*)    All other components within normal limits  PROTIME-INR - Abnormal; Notable for the following:    Prothrombin Time 26.7 (*)    INR 2.57 (*)    All other components within normal limits  OCCULT BLOOD, POC DEVICE - Abnormal; Notable for the following:    Fecal Occult Bld POSITIVE (*)    All other components within normal limits  TROPONIN I  LIPASE, BLOOD  CG4 I-STAT (LACTIC ACID)  TYPE AND SCREEN  ABO/RH   Imaging Review No results found.  EKG Interpretation    Date/Time:  Saturday May 15 2013 22:24:43 EST Ventricular Rate:  101 PR Interval:  176 QRS Duration: 75 QT Interval:  340 QTC Calculation: 441 R Axis:   76 Text Interpretation:  Sinus tachycardia Low voltage, precordial leads Borderline T wave abnormalities No significant change since last tracing Confirmed by Maryan Rued  MD, WHITNEY ([id number]) on 05/15/2013 11:56:23 PM            MDM   1. Lower GI bleed   2. Weakness     Medications  sodium chloride 0.9 % bolus 1,000 mL (not administered)  ondansetron (ZOFRAN) injection 4 mg (not administered)  0.9 %  sodium chloride infusion (0 mLs Intravenous Stopped 05/15/13 2341)  sodium chloride 0.9 % bolus 1,000 mL (1,000 mLs Intravenous New Bag/Given 05/16/13 0033)   Filed Vitals:   05/15/13 2226 05/15/13 2227 05/15/13 2302 05/16/13 0157  BP: 87/55 112/58 94/66 106/65  Pulse: 100  84 83  Temp: 97.6 F (36.4 C)   97.9 F (36.6 C)  TempSrc: Oral  Oral  Resp: 18  20 12   Height: 5\' 9"  (1.753 m)     Weight: 227 lb  (102.967 kg)     SpO2: 100%  100% 99%    Patient presenting to the ED with rectal bleeding and associated weakness that started yesterday.  Alert and oriented. GCS 15. Heart rate and rhythm normal. Pulses palpable, radial and DP 2+ bilaterally. Full ROM to upper and lower extremities. Negative pallor identified. Bowel sounds normal active in all 4 quadrants, soft, nontender upon palpation. Negative acute abdomen, negative peritoneal signs. Rectal exam noted bright red blood on glove with negative palpation of masses, lesions. Negative discomfort upon palpation to the rectum. Negative focal neurological deficits identified. Fecal occult positive. Lactic acid negative elevation. CBC noted hemoglobin of 9.4, hematocrit of 30.1-no transfusion needed at this time. CMP noted hyponatremia of 134, elevated BUN of 30, creatinine elevation of 1.59. Pro time elevated at 26.7, INR 2.5-patient on Coumadin. EKG sinus tachycardia of 101 beats per minute, negative ischemic findings or acute abnormalities identified. Troponin negative elevation. Lipase negative elevation. Type and screen in process. Patient had a bowel movement while in the emergency department, mainly of dark blood, noted blood clots. Patient placed on cardiac monitoring, pulse oximetry monitoring. IV fluids administered. Blood pressure monitored, patient stable. Discussed case with [facility] hospitalist, Dr. Susanne Borders. Dr. Susanne Borders to admit patient for Lower GI bleed. Patient stable for transfer.   Jamse Mead, PA-C 05/16/13 1451

## 2013-05-15 NOTE — ED Notes (Addendum)
Pt reports she has been having bright red bloody stools with abdominal pressure for the past 2 days, reports bleeding increased today and she began feeling weak. Pt called her PCP and was told to come to the ED to be evaluated. Pt reports that her INR was 1.4 after her knee surgery in October, pt was palced back on 10mg  Coumadin and was getting Lovenox injections at home, pt INR has remained between 2-3 since this time and has come off of the injections. Pt reports she last took Coumadin last night due to the bleeding today.

## 2013-05-16 ENCOUNTER — Encounter (HOSPITAL_COMMUNITY): Payer: Self-pay | Admitting: Internal Medicine

## 2013-05-16 DIAGNOSIS — I4891 Unspecified atrial fibrillation: Secondary | ICD-10-CM

## 2013-05-16 DIAGNOSIS — K922 Gastrointestinal hemorrhage, unspecified: Secondary | ICD-10-CM | POA: Diagnosis present

## 2013-05-16 DIAGNOSIS — N058 Unspecified nephritic syndrome with other morphologic changes: Secondary | ICD-10-CM

## 2013-05-16 DIAGNOSIS — E1129 Type 2 diabetes mellitus with other diabetic kidney complication: Secondary | ICD-10-CM

## 2013-05-16 LAB — GLUCOSE, CAPILLARY
Glucose-Capillary: 112 mg/dL — ABNORMAL HIGH (ref 70–99)
Glucose-Capillary: 113 mg/dL — ABNORMAL HIGH (ref 70–99)
Glucose-Capillary: 96 mg/dL (ref 70–99)
Glucose-Capillary: 98 mg/dL (ref 70–99)

## 2013-05-16 LAB — CBC
HCT: 26.7 % — ABNORMAL LOW (ref 36.0–46.0)
HCT: 24.7 % — ABNORMAL LOW (ref 36.0–46.0)
HCT: 24.5 % — ABNORMAL LOW (ref 36.0–46.0)
Hemoglobin: 8.3 g/dL — ABNORMAL LOW (ref 12.0–15.0)
Hemoglobin: 7.8 g/dL — ABNORMAL LOW (ref 12.0–15.0)
Hemoglobin: 7.8 g/dL — ABNORMAL LOW (ref 12.0–15.0)
MCH: 23.9 pg — ABNORMAL LOW (ref 26.0–34.0)
MCH: 24.4 pg — ABNORMAL LOW (ref 26.0–34.0)
MCH: 24.3 pg — ABNORMAL LOW (ref 26.0–34.0)
MCHC: 31.1 g/dL (ref 30.0–36.0)
MCHC: 31.6 g/dL (ref 30.0–36.0)
MCHC: 31.8 g/dL (ref 30.0–36.0)
MCV: 76.7 fL — ABNORMAL LOW (ref 78.0–100.0)
MCV: 77.2 fL — ABNORMAL LOW (ref 78.0–100.0)
MCV: 76.3 fL — ABNORMAL LOW (ref 78.0–100.0)
Platelets: 208 10*3/uL (ref 150–400)
Platelets: 209 10*3/uL (ref 150–400)
Platelets: 189 10*3/uL (ref 150–400)
RBC: 3.48 MIL/uL — ABNORMAL LOW (ref 3.87–5.11)
RBC: 3.2 MIL/uL — ABNORMAL LOW (ref 3.87–5.11)
RBC: 3.21 MIL/uL — ABNORMAL LOW (ref 3.87–5.11)
RDW: 18.3 % — ABNORMAL HIGH (ref 11.5–15.5)
RDW: 18.1 % — ABNORMAL HIGH (ref 11.5–15.5)
RDW: 18.2 % — ABNORMAL HIGH (ref 11.5–15.5)
WBC: 8 10*3/uL (ref 4.0–10.5)
WBC: 7 10*3/uL (ref 4.0–10.5)
WBC: 6.5 10*3/uL (ref 4.0–10.5)

## 2013-05-16 LAB — LIPASE, BLOOD: Lipase: 19 U/L (ref 11–59)

## 2013-05-16 LAB — TSH: TSH: 0.734 u[IU]/mL (ref 0.350–4.500)

## 2013-05-16 LAB — TYPE AND SCREEN
ABO/RH(D): B POS
Antibody Screen: NEGATIVE

## 2013-05-16 LAB — HEMOGLOBIN A1C
Hgb A1c MFr Bld: 5.1 % (ref <5.7)
Mean Plasma Glucose: 100 mg/dL (ref <117)

## 2013-05-16 LAB — ABO/RH: ABO/RH(D): B POS

## 2013-05-16 LAB — MRSA PCR SCREENING: MRSA by PCR: NEGATIVE

## 2013-05-16 MED ORDER — PANTOPRAZOLE SODIUM 20 MG PO TBEC
20.0000 mg | DELAYED_RELEASE_TABLET | Freq: Every day | ORAL | Status: DC
  Administered 2013-05-16 – 2013-05-19 (×4): 20 mg via ORAL
  Filled 2013-05-16 (×4): qty 1

## 2013-05-16 MED ORDER — SODIUM CHLORIDE 0.9 % IJ SOLN
3.0000 mL | Freq: Two times a day (BID) | INTRAMUSCULAR | Status: DC
  Administered 2013-05-16 – 2013-05-18 (×5): 3 mL via INTRAVENOUS

## 2013-05-16 MED ORDER — OXYCODONE-ACETAMINOPHEN 5-325 MG PO TABS
1.0000 | ORAL_TABLET | ORAL | Status: DC | PRN
  Administered 2013-05-17 – 2013-05-19 (×4): 1 via ORAL
  Filled 2013-05-16 (×4): qty 1

## 2013-05-16 MED ORDER — ACETAMINOPHEN 500 MG PO TABS
1000.0000 mg | ORAL_TABLET | ORAL | Status: DC | PRN
  Administered 2013-05-16: 1000 mg via ORAL
  Filled 2013-05-16: qty 2

## 2013-05-16 MED ORDER — ONDANSETRON HCL 4 MG/2ML IJ SOLN
4.0000 mg | Freq: Once | INTRAMUSCULAR | Status: AC
  Administered 2013-05-16: 4 mg via INTRAVENOUS
  Filled 2013-05-16: qty 2

## 2013-05-16 MED ORDER — SODIUM CHLORIDE 0.9 % IV BOLUS (SEPSIS)
1000.0000 mL | Freq: Once | INTRAVENOUS | Status: AC
  Administered 2013-05-16: 1000 mL via INTRAVENOUS

## 2013-05-16 MED ORDER — ONDANSETRON HCL 4 MG/2ML IJ SOLN
4.0000 mg | Freq: Four times a day (QID) | INTRAMUSCULAR | Status: DC | PRN
  Administered 2013-05-18: 4 mg via INTRAVENOUS
  Filled 2013-05-16: qty 2

## 2013-05-16 MED ORDER — PHYTONADIONE 5 MG PO TABS
5.0000 mg | ORAL_TABLET | Freq: Once | ORAL | Status: AC
  Administered 2013-05-16: 5 mg via ORAL
  Filled 2013-05-16: qty 1

## 2013-05-16 MED ORDER — CARVEDILOL 12.5 MG PO TABS
12.5000 mg | ORAL_TABLET | Freq: Two times a day (BID) | ORAL | Status: DC
  Filled 2013-05-16 (×2): qty 1

## 2013-05-16 MED ORDER — CARVEDILOL 3.125 MG PO TABS
3.1250 mg | ORAL_TABLET | Freq: Two times a day (BID) | ORAL | Status: DC
  Administered 2013-05-16: 3.125 mg via ORAL
  Filled 2013-05-16 (×5): qty 1

## 2013-05-16 MED ORDER — INSULIN DETEMIR 100 UNIT/ML ~~LOC~~ SOLN
20.0000 [IU] | Freq: Every day | SUBCUTANEOUS | Status: DC
  Administered 2013-05-16: 20 [IU] via SUBCUTANEOUS
  Filled 2013-05-16: qty 0.2

## 2013-05-16 MED ORDER — INSULIN ASPART 100 UNIT/ML ~~LOC~~ SOLN
0.0000 [IU] | SUBCUTANEOUS | Status: DC
  Administered 2013-05-18: 2 [IU] via SUBCUTANEOUS

## 2013-05-16 MED ORDER — ONDANSETRON HCL 4 MG PO TABS: 4.0000 mg | ORAL_TABLET | Freq: Four times a day (QID) | ORAL | Status: DC | PRN

## 2013-05-16 MED ORDER — SODIUM CHLORIDE 0.9 % IV SOLN
500.0000 mL | Freq: Once | INTRAVENOUS | Status: AC
  Administered 2013-05-16: 500 mL via INTRAVENOUS

## 2013-05-16 NOTE — Progress Notes (Signed)
10:10 AM I agree with HPI/GPe and A/P per Dr. Orvan Falconer     67 yr ?, known Afib, CHad2Vasc2 score=4 [risk of cva 9.4%] with associated  resolved cardiomyopathy, EF 50-55% 6/13, prior colonoscopy 12/20/2009 = pandiverticulosis, INT hemorrhoids, stage 2 chronic kidney disease, baseline microcytic anemia, chronic tobacco use Admitted from home to [facility] 12/14 with painless lower GI bleed, stool mixed with that, multiple episodes "every 50 minutes" since 05/14/2013, poor appetite States weight loss since knee surgery left knee 10/14 has difficulty to pain food and cook for herself at home and with ADL performance. No abdominal pain, no vomiting, no upper GI symptom, no fever no chills no shortness of breath     HEENT EOMI, NCAT, mild pallor Body mass index is 31.81 kg/(m^2). CHEST clinically clear no added sound CARDIAC S1-S2 irregularly irregular, telemetry = PVCs alternating A. fib ABDOMEN soft nontender no rebound NEURO grossly intact SKIN/MUSCULAR RANGE OF motion intact  Patient Active Problem List   Diagnosis Date Noted  .  acute Lower GI bleeding with mild hypotension-patient already has been seen by gastroenterology, they will locate further records and decide on further therapy inclusive and but not limited to colonoscopy . Appreciate greatly input  05/16/2013  . Atrial fibrillation, [place] 2 vascular score=4.  Not a candidate present for anticoagulation -given vitamin K on admission to reverse INR . Recheck INR in a.m.  05/16/2013  . Acute kidney injury superimposed on chronic kidney disease stage 2 , likely secondary to diabetes /hypertension -start IV fluids at 75 cc per hour, monitor volume status closely    . Cardiomyopathy-see above. Outpatient followup    .  mild hypotension-probably secondary to hypovolemic shock secondary to acute blood loss -will monitor . Get orthostatics , is not dizzy at bedside . Enalapril 10 mg twice a day held on admission Cut back Coreg to 3.125  daily with hold parameters    . LFT elevation-repeat Cmet in a.m.    . Long term current use of anticoagulant-see above  07/16/2010  .  gout-colchicine 0.6 mg held  08/15/2009  . OVERWEIGHT/OBESITY 08/15/2009  . DIABETES MELLITUS, TYPE II-on clear liquids, continue half dose of Levemir 20 units daily and slight , Victoza on hold  10/02/2007  . HYPERLIPIDEMIA-stable  10/02/2007  . DEPRESSION-stable  10/02/2007  . BREAST BIOPSY, HX OF-stable outpatient workup  10/02/2007   Verneita Griffes, MD [facility] [phone]

## 2013-05-16 NOTE — ED Provider Notes (Signed)
Medical screening examination/treatment/procedure(s) were performed by non-physician practitioner and as supervising physician I was immediately available for consultation/collaboration.  EKG Interpretation    Date/Time:  Saturday May 15 2013 22:24:43 EST Ventricular Rate:  101 PR Interval:  176 QRS Duration: 75 QT Interval:  340 QTC Calculation: 441 R Axis:   76 Text Interpretation:  Sinus tachycardia Low voltage, precordial leads Borderline T wave abnormalities No significant change since last tracing Confirmed by Maryan Rued  MD, Khalilah Hoke ([id number]) on 05/15/2013 11:56:23 PM              Blanchie Dessert, MD 05/16/13 2211

## 2013-05-16 NOTE — Consult Note (Signed)
Cross cover Dr. Benson Norway Reason for Consult:Rectal bleeding. Referring Physician: THP  Shelby CRUMB is an 67 y.o. female.  HPI: 67 year old black female, admitted last night with a 2 day history of hematochezia. She claims she was in her USOH till 05/14/13 when she started having BM's every 15-20 minutes with BRB in them. She claims she continued to bleed till yesterday and this prompted her to come to the ER. She has been on chronic anticoagulation for atrial fibrillation and decide to stop her Coumadin on Friday when she noticed the blood in her stool. She denies having any fever, chills or rigors. She describes some abdominal soreness but has not other GI complaints at this time. She had a colonoscopy done on 12/28/09 when she was found to have pandiverticulosis and medium sized internal hemorrhoids. She is on PPI's for GERD.  Past Medical History  Diagnosis Date  . Hyperkalemia   . Overweight(278.02)   . CHF (congestive heart failure)   . HLD (hyperlipidemia)   . Depression   . History of emphysema   . Atrial fibrillation     Cardioverted to NSR 08/22/2009  . CKD (chronic kidney disease)   . Mitral regurgitation     mild - echo - 1/11 /  Mild, echo, May15, 2012  . Tricuspid regurgitation     moderate - echo - 1/11  . Drug therapy 1/11    coumadin  . Thyroid dysfunction     thyromegally,diffuse,nodule LLL,06/2009  . Ejection fraction < 50%     20-25%, echo.06/2009, /  EF 30-35%, diffuse hypokinesis, echo, Oct 16, 2010  . LFT elevation     06/2009  . Cardiomyopathy   . Tobacco abuse   . Hypertension   . GERD (gastroesophageal reflux disease)   . Dizziness   . Gout     "left knee" (03/12/2013)  . Pneumonia     "@ least twice" (03/12/2013)  . History of bronchitis   . DM2 (diabetes mellitus, type 2)     fasting 80-100 (03/12/2013)  . Arthritis     "left knee" (03/12/2013)   Past Surgical History  Procedure Laterality Date  .     .     . Tonsillectomy  1960's  .  Cholecystectomy  ~ 1978  . Vaginal hysterectomy  1987  . Tubal ligation    . Cataract extraction w/ intraocular lens  implant, bilateral Bilateral 2010-2012  . Breast biopsy Right 1980's  . Breast lumpectomy Right 1980's  . Total knee arthroplasty Left 03/12/2013    Procedure: TOTAL KNEE ARTHROPLASTY;  Surgeon: Newt Minion, MD;  Location: [place];  Service: Orthopedics;  Laterality: Left;  Left total knee arthroplasty   Family History  Problem Relation Age of Onset  . Colon cancer Father and brother    . Heart disease     Social History:  reports that she has been smoking Cigarettes.  She has a 20.5 pack-year smoking history. She has never used smokeless tobacco. She reports that she does not drink alcohol or use illicit drugs.  Allergies:  Allergies  Allergen Reactions  . Codeine Hives and Nausea And Vomiting   Medications: I have reviewed the patient's current medications.  Results for orders placed during the hospital encounter of 05/15/13 (from the past 48 hour(s))  CBC WITH DIFFERENTIAL     Status: Abnormal   Collection Time    05/15/13 10:55 PM      Result Value Range   WBC 9.3  4.0 -  10.5 K/uL   RBC 3.94  3.87 - 5.11 MIL/uL   Hemoglobin 9.4 (*) 12.0 - 15.0 g/dL   HCT 30.1 (*) 36.0 - 46.0 %   MCV 76.4 (*) 78.0 - 100.0 fL   MCH 23.9 (*) 26.0 - 34.0 pg   MCHC 31.2  30.0 - 36.0 g/dL   RDW 18.2 (*) 11.5 - 15.5 %   Platelets 247  150 - 400 K/uL   Neutrophils Relative % 70  43 - 77 %   Neutro Abs 6.4  1.7 - 7.7 K/uL   Lymphocytes Relative 25  12 - 46 %   Lymphs Abs 2.3  0.7 - 4.0 K/uL   Monocytes Relative 4  3 - 12 %   Monocytes Absolute 0.4  0.1 - 1.0 K/uL   Eosinophils Relative 2  0 - 5 %   Eosinophils Absolute 0.2  0.0 - 0.7 K/uL   Basophils Relative 1  0 - 1 %   Basophils Absolute 0.1  0.0 - 0.1 K/uL  COMPREHENSIVE METABOLIC PANEL     Status: Abnormal   Collection Time    05/15/13 10:55 PM      Result Value Range   Sodium 134 (*) 135 - 145 mEq/L   Potassium 4.5   3.5 - 5.1 mEq/L   Chloride 101  96 - 112 mEq/L   CO2 22  19 - 32 mEq/L   Glucose, Bld 134 (*) 70 - 99 mg/dL   BUN 30 (*) 6 - 23 mg/dL   Creatinine, Ser 1.59 (*) 0.50 - 1.10 mg/dL   Calcium 9.2  8.4 - 10.5 mg/dL   Total Protein 6.3  6.0 - 8.3 g/dL   Albumin 3.1 (*) 3.5 - 5.2 g/dL   AST 18  0 - 37 U/L   ALT 19  0 - 35 U/L   Alkaline Phosphatase 69  39 - 117 U/L   Total Bilirubin 0.2 (*) 0.3 - 1.2 mg/dL   GFR calc non Af Amer 32 (*) >90 mL/min   GFR calc Af Amer 37 (*) >90 mL/min   Comment: (NOTE)     The eGFR has been calculated using the CKD EPI equation.     This calculation has not been validated in all clinical situations.     eGFR's persistently <90 mL/min signify possible Chronic Kidney     Disease.  PROTIME-INR     Status: Abnormal   Collection Time    05/15/13 10:55 PM      Result Value Range   Prothrombin Time 26.7 (*) 11.6 - 15.2 seconds   INR 2.57 (*) 0.00 - 1.49  TROPONIN I     Status: None   Collection Time    05/15/13 10:55 PM      Result Value Range   Troponin I <0.30  <0.30 ng/mL   Comment:            Due to the release kinetics of cTnI,     a negative result within the first hours     of the onset of symptoms does not rule out     myocardial infarction with certainty.     If myocardial infarction is still suspected,     repeat the test at appropriate intervals.  LIPASE, BLOOD     Status: None   Collection Time    05/15/13 10:55 PM      Result Value Range   Lipase 19  11 - 59 U/L  TYPE AND SCREEN  Status: None   Collection Time    05/15/13 10:55 PM      Result Value Range   ABO/RH(D) B POS     Antibody Screen NEG     Sample Expiration 05/18/2013    ABO/RH     Status: None   Collection Time    05/15/13 10:55 PM      Result Value Range   ABO/RH(D) B POS    OCCULT BLOOD, POC DEVICE     Status: Abnormal   Collection Time    05/15/13 11:31 PM      Result Value Range   Fecal Occult Bld POSITIVE (*) NEGATIVE  CG4 I-STAT (LACTIC ACID)     Status:  None   Collection Time    05/15/13 11:51 PM      Result Value Range   Lactic Acid, Venous 1.83  0.5 - 2.2 mmol/L  GLUCOSE, CAPILLARY     Status: Abnormal   Collection Time    05/16/13  8:36 AM      Result Value Range   Glucose-Capillary 112 (*) 70 - 99 mg/dL  CBC     Status: Abnormal   Collection Time    05/16/13  8:43 AM      Result Value Range   WBC 8.0  4.0 - 10.5 K/uL   RBC 3.48 (*) 3.87 - 5.11 MIL/uL   Hemoglobin 8.3 (*) 12.0 - 15.0 g/dL   HCT 26.7 (*) 36.0 - 46.0 %   MCV 76.7 (*) 78.0 - 100.0 fL   MCH 23.9 (*) 26.0 - 34.0 pg   MCHC 31.1  30.0 - 36.0 g/dL   RDW 18.3 (*) 11.5 - 15.5 %   Platelets 208  150 - 400 K/uL   Review of Systems  Constitutional: Negative.   HENT: Negative.   Eyes: Negative.   Respiratory: Negative.   Cardiovascular: Negative.   Gastrointestinal: Positive for constipation. Negative for abdominal pain.  Genitourinary: Negative.   Musculoskeletal: Positive for joint pain.  Skin: Negative.   Neurological: Negative.   Psychiatric/Behavioral: Positive for depression. Negative for suicidal ideas, hallucinations, memory loss and substance abuse. The patient is not nervous/anxious and does not have insomnia.    Blood pressure 92/54, pulse 88, temperature 97.6 F (36.4 C), temperature source Oral, resp. rate 14, height 5\' 9"  (1.753 m), weight 97.75 kg (215 lb 8 oz), SpO2 100.00%. Physical Exam  Constitutional: She is oriented to person, place, and time. She appears well-developed and well-nourished.  HENT:  Head: Normocephalic and atraumatic.  Eyes: Conjunctivae and EOM are normal. Pupils are equal, round, and reactive to light.  Neck: Normal range of motion. Neck supple.  Cardiovascular: Normal rate and regular rhythm.   Respiratory: Effort normal and breath sounds normal.  GI: Soft. Bowel sounds are normal.  Soft, obese, NTNABS  Musculoskeletal: Normal range of motion.  Neurological: She is alert and oriented to person, place, and time.  Skin:  Skin is warm and dry.  Psychiatric: She has a normal mood and affect. Her behavior is normal. Judgment and thought content normal.   Assessment/Plan: 1) Rectal bleeding-possibly a diverticular bleed. Will wait for her INR to normalize and watch her CBC's closely. She may need another colonoscopy prior to discharge as she has a family history of colon cancer in her father and brother. Will discuss with Dr. Benson Norway.  2) Anemia secondary to GI bleeding. 3) Pandiverticulosis. 4) Internal hemorroids.  5) CKD/DM/Hyperlipidemia 6) Tobacco abuse/empyhsema.  7) Cardiomyopathy/MR/TR/AF/history of CHF.  8) Depression. 9)  [place] obesity. Obadiah Dennard 05/16/2013, 9:15 AM

## 2013-05-16 NOTE — ED Notes (Signed)
Pt had 2 BM in ED, bloody red, PA informed

## 2013-05-16 NOTE — H&P (Signed)
[facility] History and Physical  [name] [id number] DOB: [birth year]    PCP:   Maximino Greenland, MD   Chief Complaint: painless BRBPR.  HPI: [name] is an 67 y.o. female with hx of CHF (EF 35%), afib on anticoagulation with Coumadin therapeutically, CKD, COPD, gout, DM2, presents to the ER with 2 days hx of painless rectal bleeding.  She felt a little lightheadedness, but no chest pain or shortness of breath.  Evalaution in the ER included Hb of 9.4g per dL, which is not far from her baseline Hb of about 10 grams per dL, her BUN was 30 with Cr of 1.6.  She said she had a colonoscopy by Dr Benson Norway about 2 years ago, and reportedly was negative, but I cannot find the report in Epic (Sounds like it was performed, however). The other significantly relevant history is that she lost 40 lbs over the past 8 months and her father and brother died of colon cancer.  Rewiew of Systems:  Constitutional: Negative for malaise, fever and chills.  Eyes: Negative for eye pain, redness and discharge, diplopia, visual changes, or flashes of light. ENMT: Negative for ear pain, hoarseness, nasal congestion, sinus pressure and sore throat. No headaches; tinnitus, drooling, or problem swallowing. Cardiovascular: Negative for chest pain, palpitations, diaphoresis, dyspnea and peripheral edema. ; No orthopnea, PND Respiratory: Negative for cough, hemoptysis, wheezing and stridor. No pleuritic chestpain. Gastrointestinal: Negative for nausea, vomiting, diarrhea, constipation, abdominal pain, melena, blood in stool, hematemesis,  Or jaundice. Genitourinary: Negative for frequency, dysuria, incontinence,flank pain and hematuria; Musculoskeletal: Negative for back pain and neck pain. Negative for swelling and trauma.;  Skin: . Negative for pruritus, rash, abrasions, bruising and skin lesion.; ulcerations Neuro: Negative for headache, lightheadedness and neck stiffness. Negative for weakness, altered  level of consciousness , altered mental status, extremity weakness, burning feet, involuntary movement, seizure and syncope.  Psych: negative for anxiety, depression, insomnia, tearfulness, panic attacks, hallucinations, paranoia, suicidal or homicidal ideation    Past Medical History  Diagnosis Date  . Hyperkalemia   . Overweight(278.02)   . CHF (congestive heart failure)   . HLD (hyperlipidemia)   . Depression   . History of emphysema   . Atrial fibrillation     Cardioverted to NSR 08/22/2009  . CKD (chronic kidney disease)   . Mitral regurgitation     mild - echo - 1/11 /  Mild, echo, May15, 2012  . Tricuspid regurgitation     moderate - echo - 1/11  . Drug therapy 1/11    coumadin  . Thyroid dysfunction     thyromegally,diffuse,nodule LLL,06/2009  . Ejection fraction < 50%     20-25%, echo.06/2009, /  EF 30-35%, diffuse hypokinesis, echo, Oct 16, 2010  . LFT elevation     06/2009  . Cardiomyopathy   . Tobacco abuse   . Hypertension   . GERD (gastroesophageal reflux disease)   . Dizziness   . Gout     "left knee" (03/12/2013)  . Pneumonia     "@ least twice" (03/12/2013)  . History of bronchitis   . DM2 (diabetes mellitus, type 2)     fasting 80-100 (03/12/2013)  . Arthritis     "left knee" (03/12/2013)    Past Surgical History  Procedure Laterality Date  . Total knee arthroplasty Left 03/12/2013  . Joint replacement    . Tonsillectomy  1960's  . Cholecystectomy  ~ 1978  . Vaginal hysterectomy  1987  . Tubal ligation    .  Cataract extraction w/ intraocular lens  implant, bilateral Bilateral 2010-2012  . Breast biopsy Right 1980's  . Breast lumpectomy Right 1980's  . Total knee arthroplasty Left 03/12/2013    Procedure: TOTAL KNEE ARTHROPLASTY;  Surgeon: Newt Minion, MD;  Location: [place];  Service: Orthopedics;  Laterality: Left;  Left total knee arthroplasty    Medications:  HOME MEDS: Prior to Admission medications   Medication Sig Start Date End Date  Taking? Authorizing Provider  acetaminophen (TYLENOL) 500 MG tablet Take 1,000 mg by mouth 3 (three) times daily as needed for pain.   Yes Historical Provider, MD  allopurinol (ZYLOPRIM) 100 MG tablet Take 100 mg by mouth 2 (two) times daily as needed.    Yes Historical Provider, MD  carvedilol (COREG) 12.5 MG tablet Take 12.5 mg by mouth 2 (two) times daily with a meal.   Yes Historical Provider, MD  colchicine 0.6 MG tablet Take 0.6 mg by mouth 2 (two) times daily as needed.    Yes Historical Provider, MD  Desoximetasone (TOPICORT) 0.25 % ointment Apply 1 application topically daily. Apply to face and neck   Yes Historical Provider, MD  enalapril (VASOTEC) 10 MG tablet TAKE 1 TABLET (10 MG TOTAL) BY MOUTH 2 (TWO) TIMES DAILY. 05/07/13  Yes Carlena Bjornstad, MD  HYDROcodone-acetaminophen [facility]) 10-325 MG per tablet Take 1 tablet by mouth every 8 (eight) hours as needed for pain.   Yes Historical Provider, MD  insulin detemir (LEVEMIR) 100 unit/ml SOLN Inject 38 Units into the skin at bedtime.   Yes Historical Provider, MD  lansoprazole (PREVACID) 15 MG capsule Take 15 mg by mouth daily.   Yes Historical Provider, MD  Liraglutide (VICTOZA) 18 MG/3ML SOLN Inject 0.6 mg into the skin daily after supper. 4pm   Yes Historical Provider, MD  Multiple Vitamin (MULITIVITAMIN WITH MINERALS) TABS Take 1 tablet by mouth daily. Centrum Silver   Yes Historical Provider, MD  oxyCODONE-acetaminophen (ROXICET) 5-325 MG per tablet Take 1 tablet by mouth every 4 (four) hours as needed for pain. 03/15/13  Yes Newt Minion, MD  warfarin (COUMADIN) 10 MG tablet Take 10 mg by mouth daily.   Yes Historical Provider, MD     Allergies:  Allergies  Allergen Reactions  . Codeine Hives and Nausea And Vomiting    Social History:   reports that she has been smoking Cigarettes.  She has a 20.5 pack-year smoking history. She has never used smokeless tobacco. She reports that she does not drink alcohol or use illicit  drugs.  Family History: Family History  Problem Relation Age of Onset  . Colon cancer    . Heart disease       Physical Exam: Filed Vitals:   05/15/13 2226 05/15/13 2227 05/15/13 2302 05/16/13 0157  BP: 87/55 112/58 94/66 106/65  Pulse: 100  84 83  Temp: 97.6 F (36.4 C)   97.9 F (36.6 C)  TempSrc: Oral   Oral  Resp: 18  20 12   Height: 5\' 9"  (1.753 m)     Weight: 102.967 kg (227 lb)     SpO2: 100%  100% 99%   Blood pressure 106/65, pulse 83, temperature 97.9 F (36.6 C), temperature source Oral, resp. rate 12, height 5\' 9"  (1.753 m), weight 102.967 kg (227 lb), SpO2 99.00%.  GEN:  Pleasant patient lying in the stretcher in no acute distress; cooperative with exam. PSYCH:  alert and oriented x4; does not appear anxious or depressed; affect is appropriate. HEENT: Mucous membranes  pink and anicteric; PERRLA; EOM intact; no cervical lymphadenopathy nor thyromegaly or carotid bruit; no JVD; There were no stridor. Neck is very supple. Breasts:: Not examined CHEST WALL: No tenderness CHEST: Normal respiration, clear to auscultation bilaterally.  HEART: Regular rate and rhythm.  There are no murmur, rub, or gallops.   BACK: No kyphosis or scoliosis; no CVA tenderness ABDOMEN: soft and non-tender; no masses, no organomegaly, normal abdominal bowel sounds; no pannus; no intertriginous candida. There is no rebound and no distention. Rectal Exam: Not done EXTREMITIES: No bone or joint deformity; age-appropriate arthropathy of the hands and knees; no edema; no ulcerations.  There is no calf tenderness. Genitalia: not examined PULSES: 2+ and symmetric SKIN: Normal hydration no rash or ulceration CNS: Cranial nerves 2-12 grossly intact no focal lateralizing neurologic deficit.  Speech is fluent; uvula elevated with phonation, facial symmetry and tongue midline. DTR are normal bilaterally, cerebella exam is intact, barbinski is negative and strengths are equaled bilaterally.  No sensory  loss.   Labs on Admission:  Basic Metabolic Panel:  Recent Labs Lab 05/15/13 2255  NA 134*  K 4.5  CL 101  CO2 22  GLUCOSE 134*  BUN 30*  CREATININE 1.59*  CALCIUM 9.2   Liver Function Tests:  Recent Labs Lab 05/15/13 2255  AST 18  ALT 19  ALKPHOS 69  BILITOT 0.2*  PROT 6.3  ALBUMIN 3.1*    Recent Labs Lab 05/15/13 2255  LIPASE 19   No results found for this basename: AMMONIA,  in the last 168 hours CBC:  Recent Labs Lab 05/15/13 2255  WBC 9.3  NEUTROABS 6.4  HGB 9.4*  HCT 30.1*  MCV 76.4*  PLT 247   Cardiac Enzymes:  Recent Labs Lab 05/15/13 2255  TROPONINI <0.30    CBG: No results found for this basename: GLUCAP,  in the last 168 hours   Radiological Exams on Admission: No results found.  Assessment/Plan Present on Admission:  . Lower GI bleeding . Atrial fibrillation . CKD (chronic kidney disease) . DIABETES MELLITUS, TYPE II . Mitral regurgitation . LFT elevation . Tobacco abuse . Tricuspid regurgitation . Thyroid dysfunction . Lower GI bleed   PLAN:  Will admit her for acute lower GI bleed.  I suspect this is diverticular bleed, but i could be AVM or malignancy.  I am concerned about her weight loss and strong family history of colon cancer, so likely she will need to have another colonoscopy.  I have not consulted GI, so please consult GI later today.  I have stopped her coumadin, and gave 5mg  of Vit K orally.  Also restricted her diet to clear liquid only.  For her DM, I will reduce her Levemir from 38 units to 20 units and added SSI.  Will hold her HTN meds, but will continue her betablocker.  Avoid giving her too much IVF, as her EF was low.  Will type and screen, and follow H/H every 6 hours.  She is stable, full code, and will be admitted to [facility] service.  Thank you for asking me to participate in her care.  Other plans as per orders.  Code Status: FULL Haskel Khan, MD. [facility] Pager [phone] 7pm to  7am.  05/16/2013, 4:09 AM

## 2013-05-16 NOTE — ED Notes (Signed)
Pt was transferred to a hospital bed due to her tall stature and knee pain.

## 2013-05-17 LAB — CBC
HCT: 27.5 % — ABNORMAL LOW (ref 36.0–46.0)
HCT: 25 % — ABNORMAL LOW (ref 36.0–46.0)
HCT: 25.9 % — ABNORMAL LOW (ref 36.0–46.0)
Hemoglobin: 8.8 g/dL — ABNORMAL LOW (ref 12.0–15.0)
Hemoglobin: 7.9 g/dL — ABNORMAL LOW (ref 12.0–15.0)
Hemoglobin: 8.1 g/dL — ABNORMAL LOW (ref 12.0–15.0)
MCH: 24.6 pg — ABNORMAL LOW (ref 26.0–34.0)
MCH: 24.2 pg — ABNORMAL LOW (ref 26.0–34.0)
MCH: 24 pg — ABNORMAL LOW (ref 26.0–34.0)
MCHC: 32 g/dL (ref 30.0–36.0)
MCHC: 31.6 g/dL (ref 30.0–36.0)
MCHC: 31.3 g/dL (ref 30.0–36.0)
MCV: 77 fL — ABNORMAL LOW (ref 78.0–100.0)
MCV: 76.7 fL — ABNORMAL LOW (ref 78.0–100.0)
MCV: 76.9 fL — ABNORMAL LOW (ref 78.0–100.0)
Platelets: 246 10*3/uL (ref 150–400)
Platelets: 196 10*3/uL (ref 150–400)
Platelets: 206 10*3/uL (ref 150–400)
RBC: 3.57 MIL/uL — ABNORMAL LOW (ref 3.87–5.11)
RBC: 3.26 MIL/uL — ABNORMAL LOW (ref 3.87–5.11)
RBC: 3.37 MIL/uL — ABNORMAL LOW (ref 3.87–5.11)
RDW: 17.9 % — ABNORMAL HIGH (ref 11.5–15.5)
RDW: 18 % — ABNORMAL HIGH (ref 11.5–15.5)
RDW: 17.9 % — ABNORMAL HIGH (ref 11.5–15.5)
WBC: 7.1 10*3/uL (ref 4.0–10.5)
WBC: 5.5 10*3/uL (ref 4.0–10.5)
WBC: 6.8 10*3/uL (ref 4.0–10.5)

## 2013-05-17 LAB — PROTIME-INR
INR: 1.6 — ABNORMAL HIGH (ref 0.00–1.49)
Prothrombin Time: 18.6 seconds — ABNORMAL HIGH (ref 11.6–15.2)

## 2013-05-17 LAB — GLUCOSE, CAPILLARY
Glucose-Capillary: 100 mg/dL — ABNORMAL HIGH (ref 70–99)
Glucose-Capillary: 108 mg/dL — ABNORMAL HIGH (ref 70–99)
Glucose-Capillary: 117 mg/dL — ABNORMAL HIGH (ref 70–99)
Glucose-Capillary: 142 mg/dL — ABNORMAL HIGH (ref 70–99)
Glucose-Capillary: 64 mg/dL — ABNORMAL LOW (ref 70–99)
Glucose-Capillary: 69 mg/dL — ABNORMAL LOW (ref 70–99)
Glucose-Capillary: 80 mg/dL (ref 70–99)
Glucose-Capillary: 99 mg/dL (ref 70–99)

## 2013-05-17 LAB — COMPREHENSIVE METABOLIC PANEL
ALT: 16 U/L (ref 0–35)
AST: 15 U/L (ref 0–37)
Albumin: 3.1 g/dL — ABNORMAL LOW (ref 3.5–5.2)
Alkaline Phosphatase: 64 U/L (ref 39–117)
BUN: 15 mg/dL (ref 6–23)
CO2: 21 mEq/L (ref 19–32)
Calcium: 9.3 mg/dL (ref 8.4–10.5)
Chloride: 105 mEq/L (ref 96–112)
Creatinine, Ser: 1 mg/dL (ref 0.50–1.10)
GFR calc Af Amer: 66 mL/min — ABNORMAL LOW (ref >90)
GFR calc non Af Amer: 57 mL/min — ABNORMAL LOW (ref >90)
Glucose, Bld: 77 mg/dL (ref 70–99)
Potassium: 3.9 mEq/L (ref 3.5–5.1)
Sodium: 136 mEq/L (ref 135–145)
Total Bilirubin: 0.3 mg/dL (ref 0.3–1.2)
Total Protein: 6.2 g/dL (ref 6.0–8.3)

## 2013-05-17 MED ORDER — ADULT MULTIVITAMIN W/MINERALS CH
1.0000 | ORAL_TABLET | Freq: Every day | ORAL | Status: DC
  Administered 2013-05-17 – 2013-05-19 (×3): 1 via ORAL
  Filled 2013-05-17 (×3): qty 1

## 2013-05-17 MED ORDER — SODIUM CHLORIDE 0.9 % IV SOLN
500.0000 mL | INTRAVENOUS | Status: DC
  Administered 2013-05-17 (×3): 500 mL via INTRAVENOUS

## 2013-05-17 MED ORDER — CARVEDILOL 12.5 MG PO TABS
12.5000 mg | ORAL_TABLET | Freq: Two times a day (BID) | ORAL | Status: DC
  Administered 2013-05-17 – 2013-05-19 (×4): 12.5 mg via ORAL
  Filled 2013-05-17 (×7): qty 1

## 2013-05-17 MED ORDER — RESOURCE INSTANT PROTEIN PO PWD PACKET
1.0000 | Freq: Three times a day (TID) | ORAL | Status: DC
  Administered 2013-05-17 – 2013-05-18 (×2): 6 g via ORAL
  Filled 2013-05-17: qty 227

## 2013-05-17 MED ORDER — BOOST / RESOURCE BREEZE PO LIQD
1.0000 | ORAL | Status: DC
  Administered 2013-05-17 – 2013-05-18 (×3): 1 via ORAL

## 2013-05-17 MED ORDER — INSULIN DETEMIR 100 UNIT/ML ~~LOC~~ SOLN
8.0000 [IU] | Freq: Every day | SUBCUTANEOUS | Status: DC
  Administered 2013-05-18 – 2013-05-19 (×2): 8 [IU] via SUBCUTANEOUS
  Filled 2013-05-17 (×4): qty 0.08

## 2013-05-17 MED ORDER — CARVEDILOL 12.5 MG PO TABS
12.5000 mg | ORAL_TABLET | Freq: Two times a day (BID) | ORAL | Status: DC
  Administered 2013-05-17: 12.5 mg via ORAL
  Filled 2013-05-17 (×3): qty 1

## 2013-05-17 NOTE — Progress Notes (Signed)
INITIAL NUTRITION ASSESSMENT  DOCUMENTATION CODES Per approved criteria  -Severe malnutrition in the context of chronic illness -Obesity Unspecified   INTERVENTION: Diet advancement per MD discretion Provide Resource Breeze once daily and Beneprotein TID until diet advanced Provide Glucerna Shakes BID and snacks BID when diet is advanced Provide Multivitamin with minerals daily Recommend Florastor  NUTRITION DIAGNOSIS: Inadequate oral intake related to poor appetite and difficulty preparing food as evidenced by 8% weight loss in less than 2 months.   Goal: Pt to meet >/= 90% of their estimated nutrition needs   Monitor:  Diet advancement Weight Labs  Reason for Assessment: Consult/ MST  67 y.o. female  Admitting Dx: Lower GI bleeding  ASSESSMENT: 67 y.o. female with hx of CHF (EF 35%), afib on anticoagulation with Coumadin therapeutically, CKD, COPD, gout, DM2, presents to the ER with 2 days hx of painless rectal bleeding.   Pt states that she has been losing weight for over a year now but, she has lost weight rapidly in the past 6 weeks. Pt states that after shoulder surgery 03/12/13 she was at [place] for 3 weeks and was eating very well; she was discharged the beginning of November and due to poor appetite and difficulty preparing food she has lost from 232 lbs to 214 lbs since (based on pt's report she has had 8% weight loss in less than 2 months). She states that since the beginning of November she has mostly been eating a sandwich and snack daily.  Today, pt reports poor appetite and reports diarrhea. Pt on clear liquid diet. Encouraged slow versus rapid weight loss; encouraged pt to snack more often, discussed ways to increase calories in diet, and encouraged pt to drink Glucerna Shakes to prevent/slow weight loss.   Nutrition Focused Physical Exam:  Subcutaneous Fat:  Orbital Region: wnl Upper Arm Region: moderate wasting Thoracic and Lumbar Region: wnl  Muscle:   Temple Region: moderate wasting Clavicle Bone Region: wnl Clavicle and Acromion Bone Region: wnl Scapular Bone Region: mild wasting Dorsal Hand: mild wasting Patellar Region: wnl Anterior Thigh Region: mild wasting Posterior Calf Region: moderate wasting  Edema: none  Pt meets criteria for SEVERE MALNUTRITION in the context of Chronic illness as evidenced by 8% weight loss in less than 2 months and estimated energy intake <75% of estimated energy needs for > 1 month.    Height: Ht Readings from Last 1 Encounters:  05/16/13 5\' 9"  (1.753 m)    Weight: Wt Readings from Last 1 Encounters:  05/17/13 214 lb 4.6 oz (97.2 kg)    Ideal Body Weight: 145 lbs  % Ideal Body Weight: 148%  Wt Readings from Last 10 Encounters:  05/17/13 214 lb 4.6 oz (97.2 kg)  03/12/13 229 lb 9.6 oz (104.146 kg)  03/12/13 229 lb 9.6 oz (104.146 kg)  03/10/13 229 lb 9.6 oz (104.146 kg)  03/18/12 265 lb 1.9 oz (120.258 kg)  10/31/11 266 lb (120.657 kg)  08/17/11 267 lb 6.7 oz (121.3 kg)  02/25/11 285 lb (129.275 kg)  12/07/10 278 lb (126.1 kg)  11/02/10 283 lb 6.4 oz (128.549 kg)    Usual Body Weight: 265 lbs (October 2013)  % Usual Body Weight: 81%  BMI:  Body mass index is 31.63 kg/(m^2).  Estimated Nutritional Needs: Kcal: 1600-1800 Protein: 90-100 grams Fluid: 2.5 L/day  Skin: WDL  Diet Order: Clear Liquid  EDUCATION NEEDS: -No education needs identified at this time   Intake/Output Summary (Last 24 hours) at 05/17/13 1136 Last data filed  at 05/17/13 0811  Gross per 24 hour  Intake   1560 ml  Output      0 ml  Net   1560 ml    Last BM: 12/15  Labs:   Recent Labs Lab 05/15/13 2255 05/17/13 0230  NA 134* 136  K 4.5 3.9  CL 101 105  CO2 22 21  BUN 30* 15  CREATININE 1.59* 1.00  CALCIUM 9.2 9.3  GLUCOSE 134* 77    CBG (last 3)   Recent Labs  05/17/13 0447 05/17/13 0519 05/17/13 0744  GLUCAP 69* 99 108*    Scheduled Meds: . carvedilol  12.5 mg Oral  BID WC  . insulin aspart  0-15 Units Subcutaneous Q4H  . insulin detemir  8 Units Subcutaneous QHS  . pantoprazole  20 mg Oral Daily  . sodium chloride  3 mL Intravenous Q12H    Continuous Infusions: . sodium chloride      Past Medical History  Diagnosis Date  . Hyperkalemia   . Overweight(278.02)   . CHF (congestive heart failure)   . HLD (hyperlipidemia)   . Depression   . History of emphysema   . Atrial fibrillation     Cardioverted to NSR 08/22/2009  . CKD (chronic kidney disease)   . Mitral regurgitation     mild - echo - 1/11 /  Mild, echo, May15, 2012  . Tricuspid regurgitation     moderate - echo - 1/11  . Drug therapy 1/11    coumadin  . Thyroid dysfunction     thyromegally,diffuse,nodule LLL,06/2009  . Ejection fraction < 50%     20-25%, echo.06/2009, /  EF 30-35%, diffuse hypokinesis, echo, Oct 16, 2010  . LFT elevation     06/2009  . Cardiomyopathy   . Tobacco abuse   . Hypertension   . GERD (gastroesophageal reflux disease)   . Dizziness   . Gout     "left knee" (03/12/2013)  . Pneumonia     "@ least twice" (03/12/2013)  . History of bronchitis   . DM2 (diabetes mellitus, type 2)     fasting 80-100 (03/12/2013)  . Arthritis     "left knee" (03/12/2013)    Past Surgical History  Procedure Laterality Date  . Total knee arthroplasty Left 03/12/2013  . Joint replacement    . Tonsillectomy  1960's  . Cholecystectomy  ~ 1978  . Vaginal hysterectomy  1987  . Tubal ligation    . Cataract extraction w/ intraocular lens  implant, bilateral Bilateral 2010-2012  . Breast biopsy Right 1980's  . Breast lumpectomy Right 1980's  . Total knee arthroplasty Left 03/12/2013    Procedure: TOTAL KNEE ARTHROPLASTY;  Surgeon: Newt Minion, MD;  Location: [place];  Service: Orthopedics;  Laterality: Left;  Left total knee arthroplasty    Pryor Ochoa RD, LDN Inpatient Clinical Dietitian Pager: [phone] After Hours Pager: [phone]

## 2013-05-17 NOTE — Care Management Note (Addendum)
    Page 1 of 2   05/19/2013     2:44:03 PM   CARE MANAGEMENT NOTE 05/19/2013  Patient:  [name], [name]   Account Number:  [id number]  Date Initiated:  05/17/2013  Documentation initiated by:  Dessa Phi  Subjective/Objective Assessment:   67 Y/O F ADMITTED W/LOWER [id number]     Action/Plan:   FROM HOME.HAS PCP,PHARMACY.   Anticipated DC Date:  05/19/2013   Anticipated DC Plan:  [place]  CM consult      Choice offered to / List presented to:  C-1 Patient        [place] arranged  [place] PT      [facility] agency  [place]   Status of service:  Completed, signed off Medicare Important Message given?   (If response is "NO", the following Medicare IM given date fields will be blank) Date Medicare IM given:   Date Additional Medicare IM given:    Discharge Disposition:  [place]  Per UR Regulation:  Reviewed for med. necessity/level of care/duration of stay  If discussed at Long Length of Stay Meetings, dates discussed:    Comments:   05/19/13 Shelby Whittingham RN,BSN NCM [phone] D/C [place] D/C,&HHPT. 05/18/13 Shelby Deupree RN,BSN NCM [phone] HPT ORDER,& FACE TO FACE FAXED W/CONFIRMATION TO [facility] W/FACE SHEET,H&P,D/C SUMMARY. PT-HH.CHOSE [facility] SINCE USED IN THE PAST.RECOMMEND HHPT.AWAITI FINAL HHPT ORDER.TC [facility] INFORMED OF REFERRAL,WILL FAX FACE SHEET,& H&P.PATIENT WANTS GEE-HHPT,INFORMED [facility] REP.  05/17/13 Shelby Hickok RN,BSN NCM [phone] ASSISTED PATIENT W/CANCELLING APPTS SINCE ADMITTED IN HOSPITAL:PCP APPT SCHEDULED,& [place] [facility] APPT.NO ANTICIPATED D/C NEEDS.NOTED VT THIS AM.

## 2013-05-17 NOTE — Evaluation (Signed)
Physical Therapy Evaluation Patient Details Name: [name] MRN: [id number] DOB: 05/14/1946 Today's Date: 05/17/2013 Time: [id number] PT Time Calculation (min): 26 min  PT Assessment / Plan / Recommendation History of Present Illness  67 yo female admitted with lower GI bleed. Recent hx of L TKA 03/2013  Clinical Impression  On eval, pt required Min guard assist for mobility-able to ambulate ~225 feet with walker. Pt performed a few TKA exercises-tolerated fairly well. Still note some swelling L knee-ice pack applied. Plan is for pt to return home and resume HHPT.     PT Assessment  Patient needs continued PT services    Follow Up Recommendations  Home health PT    Does the patient have the potential to tolerate intense rehabilitation      Barriers to Discharge        Equipment Recommendations  None recommended by PT    Recommendations for Other Services     Frequency Min 3X/week    Precautions / Restrictions Precautions Precautions: Fall Restrictions Weight Bearing Restrictions: No   Pertinent Vitals/Pain L knee 6/10. RN made aware      Mobility  Bed Mobility Bed Mobility: Supine to Sit;Sit to Supine Supine to Sit: 6: Modified independent (Device/Increase time) Sit to Supine: 6: Modified independent (Device/Increase time) Transfers Transfers: Sit to Stand;Stand to Sit Sit to Stand: 5: Supervision;From bed Stand to Sit: 5: Supervision;To bed Ambulation/Gait Ambulation/Gait Assistance: 4: Min guard Ambulation Distance (Feet): 225 Feet Assistive device: Rolling walker Ambulation/Gait Assistance Details: close guard for safety. LOB x 1 with change in direction and head turn.  Gait Pattern: Step-through pattern    Exercises Total Joint Exercises Quad Sets: AROM;Both;10 reps;Supine Straight Leg Raises: AROM;Left;Supine Long Arc Quad: AROM;Left;10 reps;Seated Knee Flexion: AROM;Left;10 reps;Seated Goniometric ROM: 0-110 degrees   PT Diagnosis: Difficulty  walking;Abnormality of gait  PT Problem List: Decreased mobility;Pain;Decreased range of motion;Decreased strength PT Treatment Interventions: DME instruction;Gait training;Functional mobility training;Therapeutic activities;Therapeutic exercise;Patient/family education     PT Goals(Current goals can be found in the care plan section) Acute Rehab PT Goals Patient Stated Goal: home soon; resume PT PT Goal Formulation: With patient Time For Goal Achievement: 05/31/13 Potential to Achieve Goals: Good  Visit Information  Last PT Received On: 05/17/13 Assistance Needed: +1 History of Present Illness: 67 yo female admitted with lower GI bleed. Recent hx of L TKA 03/2013       Prior [place] expects to be discharged to:: Private residence Living Arrangements: Alone (son works-at pt's home in evenings) Type of Home: House Home Access: Stairs to enter [employer] of Steps: 2 Entrance Stairs-Rails: Right Home Layout: One [place]: [place] - single point;Walker - 2 wheels;Bedside commode Prior Function Level of Independence: Independent with assistive device(s) Comments: alternates between cane and walker-use walker when knee(s) swells.  Communication Communication: No difficulties    Cognition  Cognition Arousal/Alertness: Awake/alert Behavior During Therapy: WFL for tasks assessed/performed Overall Cognitive Status: Within Functional Limits for tasks assessed    Extremity/Trunk Assessment Upper Extremity Assessment Upper Extremity Assessment: Overall WFL for tasks assessed Lower Extremity Assessment Lower Extremity Assessment: Overall WFL for tasks assessed;LLE deficits/detail LLE Deficits / Details: Strength at least 4/5 throughout Cervical / Trunk Assessment Cervical / Trunk Assessment: Normal   Balance    End of Session PT - End of Session Activity Tolerance: Patient tolerated treatment well Patient left: in bed;with call  bell/phone within reach;with family/visitor present  GP     Weston Anna, MPT  Pager: [phone]

## 2013-05-17 NOTE — Progress Notes (Signed)
Hypoglycemic Event  CBG: 69  Treatment: 15 GM carbohydrate snack  Symptoms: None  Follow-up CBG: Time: 20 CBG Result:99  Possible Reasons for Event: Inadequate meal intake  Comments/MD notified:On call NP notified; no new orders received.    [name]  Remember to initiate Hypoglycemia Order Set & complete

## 2013-05-17 NOTE — Progress Notes (Signed)
Stat dose coreg 12.5 given per MD order

## 2013-05-17 NOTE — Progress Notes (Signed)
Subjective: No further bleeding since this morning.  She feels well.  Objective: Vital signs in last 24 hours: Temp:  [97.5 F (36.4 C)-98.1 F (36.7 C)] 97.5 F (36.4 C) (12/15 1438) Pulse Rate:  [74-80] 74 (12/15 1438) Resp:  [16] 16 (12/15 1438) BP: (106-147)/(56-69) 118/56 mmHg (12/15 1807) SpO2:  [100 %] 100 % (12/15 1438) Weight:  [214 lb 4.6 oz (97.2 kg)] 214 lb 4.6 oz (97.2 kg) (12/15 0414) Last BM Date: 05/17/13  Intake/Output from previous day: 12/14 0701 - 12/15 0700 In: 1320 [P.O.:820; I.V.:500] Out: -  Intake/Output this shift:    General appearance: alert and no distress GI: soft, non-tender; bowel sounds normal; no masses,  no organomegaly  Lab Results:  Recent Labs  05/16/13 2221 05/17/13 0230 05/17/13 1020  WBC 6.5 7.1 5.5  HGB 7.8* 8.8* 7.9*  HCT 24.5* 27.5* 25.0*  PLT 189 246 196   BMET  Recent Labs  05/15/13 2255 05/17/13 0230  NA 134* 136  K 4.5 3.9  CL 101 105  CO2 22 21  GLUCOSE 134* 77  BUN 30* 15  CREATININE 1.59* 1.00  CALCIUM 9.2 9.3   LFT  Recent Labs  05/17/13 0230  PROT 6.2  ALBUMIN 3.1*  AST 15  ALT 16  ALKPHOS 64  BILITOT 0.3   PT/INR  Recent Labs  05/15/13 2255 05/17/13 0230  LABPROT 26.7* 18.6*  INR 2.57* 1.60*   Hepatitis Panel No results found for this basename: HEPBSAG, HCVAB, HEPAIGM, HEPBIGM,  in the last 72 hours C-Diff No results found for this basename: CDIFFTOX,  in the last 72 hours Fecal Lactopherrin No results found for this basename: FECLLACTOFRN,  in the last 72 hours  Studies/Results: No results found.  Medications:  Scheduled: . carvedilol  12.5 mg Oral BID WC  . feeding supplement (RESOURCE BREEZE)  1 Container Oral Q24H  . insulin aspart  0-15 Units Subcutaneous Q4H  . insulin detemir  8 Units Subcutaneous QHS  . multivitamin with minerals  1 tablet Oral Daily  . pantoprazole  20 mg Oral Daily  . protein supplement  1 scoop Oral TID WC  . sodium chloride  3 mL Intravenous  Q12H   Continuous: . sodium chloride 500 mL (05/17/13 1404)    Assessment/Plan: 1) Diverticular bleed. 2) Anemia.   Currently she is stable.  No further bleeding since this morning.  Her symptoms are consistent with a diverticular bleed.  No repeat colonoscopy is warranted at this time.  Plan: 1) If no further bleeding overnight she can be discharged home.  She may rebleed, but this is the nature of diverticular bleeds. 2) I will have her follow up in 2 weeks.   LOS: 2 days   Yanice Maqueda D 05/17/2013, 7:01 PM

## 2013-05-17 NOTE — Progress Notes (Signed)
Hypoglycemic Event  CBG: 64  Treatment: 15 GM carbohydrate snack  Symptoms: None  Follow-up CBG: Time:15 CBG Result:69  Possible Reasons for Event: Inadequate meal intake  Comments/MD notified: On-call NP notified no new orders at this time    [name]  Remember to initiate Hypoglycemia Order Set & complete

## 2013-05-17 NOTE — Progress Notes (Addendum)
[name] [id number] DOB: [birth year] DOA: 05/15/2013 PCP: Maximino Greenland, MD  Brief narrative: 78 yr ?, known Afib, CHad2Vasc2 score=4 [risk of cva 9.4%] with associated resolved cardiomyopathy, EF 50-55% 6/13, prior colonoscopy 12/20/2009 = pandiverticulosis, INT hemorrhoids, stage 2 chronic kidney disease, baseline microcytic anemia, chronic tobacco use  Admitted from home to [facility] 12/14 with painless lower GI bleed, stool mixed with that, multiple episodes "every 50 minutes" since 05/14/2013, poor appetite  States weight loss since knee surgery left knee 10/14 has difficulty to pain food and cook for herself at home and with ADL performance.  No abdominal pain, no vomiting, no upper GI symptom, no fever no chills no shortness of breath   Past medical history-As per Problem list Chart reviewed as below-  Consultants:  None currently  Procedures:  None  Antibiotics:  None   Subjective  Episodic V. tach about 60 beats on the monitor this morning, note that  Patient's beta blocker been cut back as patient was slightly hypotensive secondary to possible GI bleed   Objective    Interim History: None  Telemetry: Episodes of V. tach EKG however shows resolution PR interval 0.12 QRS axis 45 no ST-T wave elevations and actually normal sinus rhythm.  Objective: Filed Vitals:   05/16/13 2014 05/16/13 2346 05/17/13 0414 05/17/13 0959  BP: 115/61 112/67 147/67 106/56  Pulse: 78 80 74   Temp: 98.1 F (36.7 C)  98.1 F (36.7 C)   TempSrc: Oral  Oral   Resp: 16  16   Height:      Weight:   97.2 kg (214 lb 4.6 oz)   SpO2: 100%  100%     Intake/Output Summary (Last 24 hours) at 05/17/13 1030 Last data filed at 05/17/13 0811  Gross per 24 hour  Intake   1560 ml  Output      0 ml  Net   1560 ml    Exam:  General: EOMI, NCAT Cardiovascular: S1-S2 no murmur rub or gallop Respiratory: Clinically clear Abdomen: Soft nontender nondistended Skin no  lower extremity edema Neuro intact  Data Reviewed: Basic Metabolic Panel:  Recent Labs Lab 05/15/13 2255 05/17/13 0230  NA 134* 136  K 4.5 3.9  CL 101 105  CO2 22 21  GLUCOSE 134* 77  BUN 30* 15  CREATININE 1.59* 1.00  CALCIUM 9.2 9.3   Liver Function Tests:  Recent Labs Lab 05/15/13 2255 05/17/13 0230  AST 18 15  ALT 19 16  ALKPHOS 69 64  BILITOT 0.2* 0.3  PROT 6.3 6.2  ALBUMIN 3.1* 3.1*    Recent Labs Lab 05/15/13 2255  LIPASE 19   No results found for this basename: AMMONIA,  in the last 168 hours CBC:  Recent Labs Lab 05/15/13 2255 05/16/13 0843 05/16/13 2008 05/16/13 2221 05/17/13 0230  WBC 9.3 8.0 7.0 6.5 7.1  NEUTROABS 6.4  --   --   --   --   HGB 9.4* 8.3* 7.8* 7.8* 8.8*  HCT 30.1* 26.7* 24.7* 24.5* 27.5*  MCV 76.4* 76.7* 77.2* 76.3* 77.0*  PLT 247 208 209 189 246   Cardiac Enzymes:  Recent Labs Lab 05/15/13 2255  TROPONINI <0.30   BNP: No components found with this basename: POCBNP,  CBG:  Recent Labs Lab 05/16/13 2349 05/17/13 0417 05/17/13 0447 05/17/13 0519 05/17/13 0744  GLUCAP 117* 64* 69* 99 108*    Recent Results (from the past 240 hour(s))  MRSA PCR SCREENING     Status: None  Collection Time    05/16/13  9:12 AM      Result Value Range Status   MRSA by PCR NEGATIVE  NEGATIVE Final   Comment:            The GeneXpert MRSA Assay (FDA     approved for NASAL specimens     only), is one component of a     comprehensive MRSA colonization     surveillance program. It is not     intended to diagnose MRSA     infection nor to guide or     monitor treatment for     MRSA infections.     Studies:              All Imaging reviewed and is as per above notation   Scheduled Meds: . carvedilol  12.5 mg Oral BID WC  . insulin aspart  0-15 Units Subcutaneous Q4H  . insulin detemir  8 Units Subcutaneous QHS  . pantoprazole  20 mg Oral Daily  . sodium chloride  3 mL Intravenous Q12H   Continuous Infusions:     Assessment/Plan: Patient Active Problem List    Diagnosis  Date Noted   .  acute Lower GI bleeding with mild hypotension-patient already has been seen by gastroenterology, they will locate further records and decide on further therapy inclusive and but not limited to colonoscopy . Appreciate greatly input  05/16/2013   .  Atrial fibrillation, episodic V. tach, [place] 2 vascular score=4. Not a candidate present for anticoagulation -given vitamin K on admission to reverse INR . Repeat INR 1.60 12/15 -Coreg increased to home dose 0.5 twice a day  05/16/2013   .  Acute kidney injury superimposed on chronic kidney disease stage 2 , likely secondary to diabetes /hypertension -start IV fluids at 75 cc per hour, monitor volume status closely-resolving , scale back IV fluids    .  Cardiomyopathy-see above. Outpatient followup    .  mild hypotension-probably secondary to hypovolemic shock secondary to acute blood loss -will monitor .  Enalapril 10 mg twice a day held on admission    .  LFT elevation-repeat Cmet in a.m.-No elevations currently    .  Long term current use of anticoagulant-see above  07/16/2010   .  gout-colchicine 0.6 mg held  08/15/2009   .  OVERWEIGHT/OBESITY  08/15/2009   .  DIABETES MELLITUS, TYPE II-on clear liquids, co hypoglycemia overnight 12/14, cut back m8 units  10/02/2007   .  HYPERLIPIDEMIA-stable  10/02/2007   .  DEPRESSION-stable  10/02/2007   .  BREAST BIOPSY, HX OF-stable outpatient workup  10/02/2007     Await GI input   Code Status: Full Family Communication: None at bedside patient able to update Disposition Plan: Inpatient   Verneita Griffes, MD  [facility] Pager [phone] 05/17/2013, 10:30 AM    LOS: 2 days

## 2013-05-17 NOTE — Progress Notes (Signed)
Had liquid stool with flecks of red ?tissue in it.

## 2013-05-18 LAB — CBC
HCT: 26 % — ABNORMAL LOW (ref 36.0–46.0)
HCT: 25.6 % — ABNORMAL LOW (ref 36.0–46.0)
HCT: 24.1 % — ABNORMAL LOW (ref 36.0–46.0)
Hemoglobin: 8.2 g/dL — ABNORMAL LOW (ref 12.0–15.0)
Hemoglobin: 7.9 g/dL — ABNORMAL LOW (ref 12.0–15.0)
Hemoglobin: 7.6 g/dL — ABNORMAL LOW (ref 12.0–15.0)
MCH: 24.3 pg — ABNORMAL LOW (ref 26.0–34.0)
MCH: 23.9 pg — ABNORMAL LOW (ref 26.0–34.0)
MCH: 24.5 pg — ABNORMAL LOW (ref 26.0–34.0)
MCHC: 31.5 g/dL (ref 30.0–36.0)
MCHC: 30.9 g/dL (ref 30.0–36.0)
MCHC: 31.5 g/dL (ref 30.0–36.0)
MCV: 76.9 fL — ABNORMAL LOW (ref 78.0–100.0)
MCV: 77.6 fL — ABNORMAL LOW (ref 78.0–100.0)
MCV: 77.7 fL — ABNORMAL LOW (ref 78.0–100.0)
Platelets: 217 10*3/uL (ref 150–400)
Platelets: 192 10*3/uL (ref 150–400)
Platelets: 224 10*3/uL (ref 150–400)
RBC: 3.38 MIL/uL — ABNORMAL LOW (ref 3.87–5.11)
RBC: 3.3 MIL/uL — ABNORMAL LOW (ref 3.87–5.11)
RBC: 3.1 MIL/uL — ABNORMAL LOW (ref 3.87–5.11)
RDW: 18 % — ABNORMAL HIGH (ref 11.5–15.5)
RDW: 17.8 % — ABNORMAL HIGH (ref 11.5–15.5)
RDW: 18 % — ABNORMAL HIGH (ref 11.5–15.5)
WBC: 6.4 10*3/uL (ref 4.0–10.5)
WBC: 6.1 10*3/uL (ref 4.0–10.5)
WBC: 7.3 10*3/uL (ref 4.0–10.5)

## 2013-05-18 LAB — GLUCOSE, CAPILLARY
Glucose-Capillary: 102 mg/dL — ABNORMAL HIGH (ref 70–99)
Glucose-Capillary: 146 mg/dL — ABNORMAL HIGH (ref 70–99)
Glucose-Capillary: 85 mg/dL (ref 70–99)
Glucose-Capillary: 87 mg/dL (ref 70–99)
Glucose-Capillary: 91 mg/dL (ref 70–99)
Glucose-Capillary: 99 mg/dL (ref 70–99)

## 2013-05-18 MED ORDER — GLUCERNA SHAKE PO LIQD
237.0000 mL | Freq: Two times a day (BID) | ORAL | Status: DC
  Administered 2013-05-18 – 2013-05-19 (×2): 237 mL via ORAL
  Filled 2013-05-18 (×3): qty 237

## 2013-05-18 MED ORDER — INSULIN DETEMIR 100 UNIT/ML FLEXPEN: 8.0000 [IU] | Freq: Every day | SUBCUTANEOUS | Status: DC

## 2013-05-18 NOTE — Discharge Summary (Addendum)
Physician Discharge Summary  [name] [id number] DOB: 11-27-1945 DOA: 05/15/2013  PCP: Maximino Greenland, MD  Admit date: 05/15/2013 Discharge date: 05/18/2013  Time spent: 35 minutes  Recommendations for Outpatient Follow-up:   1. Close follow up with cardiologist and gastroenterologist to determine timing and duration of Coumadin. Understands the risks of not taking Coumadin and that there is a 10% risk of stroke per year with without this 2. Recommend CBC/complete metabolic panel in about 5-7 days 3. Please check A1c per guidelines   Discharge Diagnoses:  Principal Problem:   Lower GI bleeding Active Problems:   DIABETES MELLITUS, TYPE II   Atrial fibrillation   Tricuspid regurgitation   Thyroid dysfunction   LFT elevation   Mitral regurgitation   CKD (chronic kidney disease)   Tobacco abuse   Lower GI bleed   Discharge Condition: Stable  Diet recommendation: Low-residue, BRAT diet  Filed Weights   05/16/13 0802 05/17/13 0414 05/18/13 0512  Weight: 97.75 kg (215 lb 8 oz) 97.2 kg (214 lb 4.6 oz) 102.6 kg (226 lb 3.1 oz)    History of present illness:  53 yr ?, known Afib, CHad2Vasc2 score=4 [risk of cva 9.4%] with associated resolved cardiomyopathy, EF 50-55% 6/13, prior colonoscopy 12/20/2009 = pandiverticulosis, INT hemorrhoids, stage 2 chronic kidney disease, baseline microcytic anemia, chronic tobacco use  Admitted from home to [facility] 12/14 with painless lower GI bleed, stool mixed with that, multiple episodes "every 50 minutes" since 05/14/2013, poor appetite  States weight loss since knee surgery left knee 10/14 has difficulty to pain food and cook for herself at home and with ADL performance.  No abdominal pain, no vomiting, no upper GI symptom, no fever no chills no shortness of breath   Hospital Course:  Patient Active Problem List    Diagnosis  Date Noted   .  acute Lower GI bleeding [ history pandiverticulosis with internal hemorrhoid  12/20/2009 colonoscopy] with mild hypotension-patient hydrated in hospital, type and screen but did not need transfusion .  05/16/2013   .  Atrial fibrillation, episodic V. tach, [place] 2 vascular score=4. Not a candidate present for anticoagulation -given vitamin K on admission to reverse INR . Repeat INR 1.60 12/15 -Coreg increased to home dose 12.5 twice a day  05/16/2013   .  Acute kidney injury superimposed on chronic kidney disease stage 2 , likely secondary to diabetes /hypertension-initially volume depleted and kept on IV fluids this was discontinued 12/60    .  Cardiomyopathy-see above. Outpatient followup    .  mild hypotension-probably secondary to hypovolemic shock secondary to acute blood loss -will monitor . Enalapril 10 mg twice a day held   .  LFT elevation-No elevations currently    .  Long term current use of anticoagulant-see above  07/16/2010   .  gout-colchicine 0.6 mg held-restarted on admission  08/15/2009   .  OVERWEIGHT/OBESITY  08/15/2009   .  DIABETES MELLITUS, TYPE II-on clear liquids, no hypoglycemia overnight 12/14, cut back 8 units on discharge home as did experience some hyperglycemia during this admission as was not eating much  10/02/2007   .  HYPERLIPIDEMIA-stable  10/02/2007   .  DEPRESSION-stable  10/02/2007   .  BREAST BIOPSY, HX OF-stable outpatient workup  10/02/2007     Procedures:  None   Consultations:  Gastroenterology  Discharge Exam: Filed Vitals:   05/18/13 0448  BP: 137/70  Pulse: 82  Temp: 98.2 F (36.8 C)  Resp: 20    General: EOMI,  NCAT, pleasant, no distress Body mass index is 33.39 kg/(m^2). Cardiovascular: S1-S2 no murmur rub or gallop, sinus rhythm goes up to 120s nonsustained tachycardia Respiratory: Clinically clear no added sounds or rails  Discharge Instructions   Future Appointments Provider Department Dept Phone   06/07/2013 12:00 PM Carlena Bjornstad, MD [place] Office [phone]       Medication  List    STOP taking these medications       warfarin 10 MG tablet  Commonly known as:  COUMADIN      TAKE these medications       acetaminophen 500 MG tablet  Commonly known as:  TYLENOL  Take 1,000 mg by mouth 3 (three) times daily as needed for pain.     allopurinol 100 MG tablet  Commonly known as:  ZYLOPRIM  Take 100 mg by mouth 2 (two) times daily as needed.     carvedilol 12.5 MG tablet  Commonly known as:  COREG  Take 12.5 mg by mouth 2 (two) times daily with a meal.     colchicine 0.6 MG tablet  Take 0.6 mg by mouth 2 (two) times daily as needed.     Desoximetasone 0.25 % ointment  Commonly known as:  TOPICORT  Apply 1 application topically daily. Apply to face and neck     enalapril 10 MG tablet  Commonly known as:  VASOTEC  TAKE 1 TABLET (10 MG TOTAL) BY MOUTH 2 (TWO) TIMES DAILY.     HYDROcodone-acetaminophen 10-325 MG per tablet  Commonly known as:  NORCO  Take 1 tablet by mouth every 8 (eight) hours as needed for pain.     insulin detemir 100 unit/ml Soln  Commonly known as:  LEVEMIR  Inject 8 Units into the skin at bedtime.     lansoprazole 15 MG capsule  Commonly known as:  PREVACID  Take 15 mg by mouth daily.     multivitamin with minerals Tabs tablet  Take 1 tablet by mouth daily. Centrum Silver     oxyCODONE-acetaminophen 5-325 MG per tablet  Commonly known as:  ROXICET  Take 1 tablet by mouth every 4 (four) hours as needed for pain.     VICTOZA 18 MG/3ML Soln injection  Generic drug:  Liraglutide  Inject 0.6 mg into the skin daily after supper. 4pm       Allergies  Allergen Reactions  . Codeine Hives and Nausea And Vomiting      The results of significant diagnostics from this hospitalization (including imaging, microbiology, ancillary and laboratory) are listed below for reference.    Significant Diagnostic Studies: No results found.  Microbiology: Recent Results (from the past 240 hour(s))  MRSA PCR SCREENING     Status:  None   Collection Time    05/16/13  9:12 AM      Result Value Range Status   MRSA by PCR NEGATIVE  NEGATIVE Final   Comment:            The GeneXpert MRSA Assay (FDA     approved for NASAL specimens     only), is one component of a     comprehensive MRSA colonization     surveillance program. It is not     intended to diagnose MRSA     infection nor to guide or     monitor treatment for     MRSA infections.     Labs: Basic Metabolic Panel:  Recent Labs Lab 05/15/13 2255 05/17/13 0230  NA 134* 136  K 4.5 3.9  CL 101 105  CO2 22 21  GLUCOSE 134* 77  BUN 30* 15  CREATININE 1.59* 1.00  CALCIUM 9.2 9.3   Liver Function Tests:  Recent Labs Lab 05/15/13 2255 05/17/13 0230  AST 18 15  ALT 19 16  ALKPHOS 69 64  BILITOT 0.2* 0.3  PROT 6.3 6.2  ALBUMIN 3.1* 3.1*    Recent Labs Lab 05/15/13 2255  LIPASE 19   No results found for this basename: AMMONIA,  in the last 168 hours CBC:  Recent Labs Lab 05/15/13 2255  05/17/13 0230 05/17/13 1020 05/17/13 2300 05/18/13 0510 05/18/13 1000  WBC 9.3  < > 7.1 5.5 6.8 6.4 6.1  NEUTROABS 6.4  --   --   --   --   --   --   HGB 9.4*  < > 8.8* 7.9* 8.1* 8.2* 7.9*  HCT 30.1*  < > 27.5* 25.0* 25.9* 26.0* 25.6*  MCV 76.4*  < > 77.0* 76.7* 76.9* 76.9* 77.6*  PLT 247  < > 246 196 206 217 192  < > = values in this interval not displayed. Cardiac Enzymes:  Recent Labs Lab 05/15/13 2255  TROPONINI <0.30   BNP: BNP (last 3 results) No results found for this basename: PROBNP,  in the last 8760 hours CBG:  Recent Labs Lab 05/17/13 1613 05/17/13 2036 05/17/13 2359 05/18/13 0445 05/18/13 0728  GLUCAP 142* 80 85 91 87       Signed:  Jimmy Plessinger, [place]  [facility] 05/18/2013, 11:08 AM

## 2013-05-18 NOTE — Progress Notes (Signed)
Physical Therapy Treatment Patient Details Name: [name] MRN: [id number] DOB: 04/06/1946 Today's Date: 05/18/2013 Time: [id number] PT Time Calculation (min): 28 min  PT Assessment / Plan / Recommendation  History of Present Illness 67 yo female admitted with lower GI bleed. Recent hx of L TKA 03/2013   PT Comments   Pt improved this session and is mod I with all mobility.  Will benefit from Keller for continued L knee strengthening and ROM.  Follow Up Recommendations  Home health PT     Does the patient have the potential to tolerate intense rehabilitation     Barriers to Discharge        Equipment Recommendations  None recommended by PT    Recommendations for Other Services    Frequency Min 3X/week   Progress towards PT Goals Progress towards PT goals: Progressing toward goals  Plan Current plan remains appropriate    Precautions / Restrictions Precautions Precautions: Fall Restrictions Weight Bearing Restrictions: No   Pertinent Vitals/Pain No c/o pain    Mobility  Bed Mobility Supine to Sit: 6: Modified independent (Device/Increase time) Sit to Supine: 6: Modified independent (Device/Increase time) Transfers Sit to Stand: 6: Modified independent (Device/Increase time) Stand to Sit: 6: Modified independent (Device/Increase time) Ambulation/Gait Ambulation/Gait Assistance: 6: Modified independent (Device/Increase time) Ambulation Distance (Feet): 250 Feet    Exercises Total Joint Exercises Hip ABduction/ADduction: AROM;10 reps;Left Long Arc Quad: AROM;Left;10 reps Knee Flexion: AROM;Left;10 reps Other Exercises Other Exercises: sit to stand x 5   PT Diagnosis:    PT Problem List:   PT Treatment Interventions:     PT Goals (current goals can now be found in the care plan section)    Visit Information  Last PT Received On: 05/18/13 Assistance Needed: +1 History of Present Illness: 67 yo female admitted with lower GI bleed. Recent hx of L  TKA 03/2013    Subjective Data      Cognition  Cognition Arousal/Alertness: Awake/alert Behavior During Therapy: WFL for tasks assessed/performed Overall Cognitive Status: Within Functional Limits for tasks assessed    Balance  Dynamic Standing Balance Dynamic Standing - Balance Support: During functional activity Dynamic Standing - Level of Assistance: 6: Modified independent (Device/Increase time)  End of Session PT - End of Session Equipment Utilized During Treatment: Gait belt Activity Tolerance: Patient tolerated treatment well Patient left: in bed;with call bell/phone within reach   GP     [facility] 05/18/2013, 1:44 PM

## 2013-05-19 DIAGNOSIS — N189 Chronic kidney disease, unspecified: Secondary | ICD-10-CM

## 2013-05-19 DIAGNOSIS — E43 Unspecified severe protein-calorie malnutrition: Secondary | ICD-10-CM | POA: Insufficient documentation

## 2013-05-19 DIAGNOSIS — N179 Acute kidney failure, unspecified: Secondary | ICD-10-CM

## 2013-05-19 LAB — GLUCOSE, CAPILLARY
Glucose-Capillary: 103 mg/dL — ABNORMAL HIGH (ref 70–99)
Glucose-Capillary: 110 mg/dL — ABNORMAL HIGH (ref 70–99)
Glucose-Capillary: 93 mg/dL (ref 70–99)

## 2013-05-19 MED ORDER — OXYCODONE-ACETAMINOPHEN 5-325 MG PO TABS: 1.0000 | ORAL_TABLET | ORAL | Status: DC | PRN

## 2013-05-19 NOTE — Discharge Summary (Signed)
Physician Discharge Summary  [name] [id number] DOB: 1945-08-16 DOA: 05/15/2013  PCP: Maximino Greenland, MD  Admit date: 05/15/2013 Discharge date: 05/19/2013  Time spent: 35 minutes  Recommendations for Outpatient Follow-up:   1. Close follow up with cardiologist and gastroenterologist to determine timing and duration of Coumadin. Understands the risks of not taking Coumadin and that there is a 10% risk of stroke per year with without this 2. Recommend CBC/complete metabolic panel in about 5-7 days    Discharge Diagnoses:  Principal Problem:   Lower GI bleeding Active Problems:   DIABETES MELLITUS, TYPE II   Atrial fibrillation   Tricuspid regurgitation   Thyroid dysfunction   LFT elevation   Mitral regurgitation   CKD (chronic kidney disease)   Tobacco abuse   Lower GI bleed   Protein-calorie malnutrition, severe   Discharge Condition: Stable  Diet recommendation: Low-residue, BRAT diet  Filed Weights   05/17/13 0414 05/18/13 0512 05/19/13 0428  Weight: 97.2 kg (214 lb 4.6 oz) 102.6 kg (226 lb 3.1 oz) 103.602 kg (228 lb 6.4 oz)    History of present illness:  78 yr ?, known Afib, CHad2Vasc2 score=4 [risk of cva 9.4%] with associated resolved cardiomyopathy, EF 50-55% 6/13, prior colonoscopy 12/20/2009 = pandiverticulosis, INT hemorrhoids, stage 2 chronic kidney disease, baseline microcytic anemia, chronic tobacco use  Admitted from home to [facility] 12/14 with painless lower GI bleed, stool mixed with that, multiple episodes "every 50 minutes" since 05/14/2013, poor appetite  States weight loss since knee surgery left knee 10/14 has difficulty to pain food and cook for herself at home and with ADL performance.  No abdominal pain, no vomiting, no upper GI symptom, no fever no chills no shortness of breath   Hospital Course:  Patient Active Problem List    Diagnosis  Date Noted   .  acute Lower GI bleeding [ history pandiverticulosis with internal  hemorrhoid 12/20/2009 colonoscopy] with mild hypotension-patient hydrated in hospital, type and screen but did not need transfusion .  05/16/2013   .  Atrial fibrillation, episodic V. tach, [place] 2 vascular score=4. Not a candidate present for anticoagulation -given vitamin K on admission to reverse INR . Repeat INR 1.60 12/15 -Coreg increased to home dose 12.5 twice a day  -coumadin stopped--coordinate with GI and cards as to optimal time to restart if deemed necessary 05/16/2013   .  Acute kidney injury superimposed on chronic kidney disease stage 2 , likely secondary to diabetes /hypertension-initially volume depleted and kept on IV fluids this was discontinued 12/6 -serum creatinine 1.00 at time of d/c   .  Cardiomyopathy-see above. Outpatient followup    .  mild hypotension-probably secondary to hypovolemic shock secondary to acute blood loss -will monitor . Enalapril 10 mg twice a day held -restart coreg and enalapril as bp has improved and stable   .  LFT elevation-No elevations currently    .  Long term current use of anticoagulant-see above  07/16/2010   .  gout-colchicine 0.6 mg held-restarted on admission  -Rx percocet 5/325, #12, one po q 4prn pain, no refills 08/15/2009   .  OVERWEIGHT/OBESITY  08/15/2009   .  DIABETES MELLITUS, TYPE II-on clear liquids, no hypoglycemia overnight 12/14, cut back 8 units on discharge home as did experience some hyperglycemia during this admission as was not eating much  -HbA1C= 5.1 10/02/2007   .  HYPERLIPIDEMIA-stable  10/02/2007   .  DEPRESSION-stable  10/02/2007   .  BREAST BIOPSY, HX OF-stable outpatient  workup  10/02/2007     Procedures:  None   Consultations:  Gastroenterology  Discharge Exam: Filed Vitals:   05/19/13 0428  BP: 127/68  Pulse: 71  Temp: 97.6 F (36.4 C)  Resp: 16    General: EOMI, NCAT, pleasant, no distress Body mass index is 33.71 kg/(m^2). Cardiovascular: S1-S2 no murmur rub or gallop, RRR Respiratory:  Clinically clear no added sounds or rails Abd--soft/nt+BS  Discharge Instructions      Discharge Orders   Future Appointments Provider Department Dept Phone   06/07/2013 12:00 PM Carlena Bjornstad, MD [place] Office [phone]   Future Orders Complete By Expires   Diet Carb Modified  As directed    Increase activity slowly  As directed        Medication List    STOP taking these medications       warfarin 10 MG tablet  Commonly known as:  COUMADIN      TAKE these medications       acetaminophen 500 MG tablet  Commonly known as:  TYLENOL  Take 1,000 mg by mouth 3 (three) times daily as needed for pain.     allopurinol 100 MG tablet  Commonly known as:  ZYLOPRIM  Take 100 mg by mouth 2 (two) times daily as needed.     carvedilol 12.5 MG tablet  Commonly known as:  COREG  Take 12.5 mg by mouth 2 (two) times daily with a meal.     colchicine 0.6 MG tablet  Take 0.6 mg by mouth 2 (two) times daily as needed.     Desoximetasone 0.25 % ointment  Commonly known as:  TOPICORT  Apply 1 application topically daily. Apply to face and neck     enalapril 10 MG tablet  Commonly known as:  VASOTEC  TAKE 1 TABLET (10 MG TOTAL) BY MOUTH 2 (TWO) TIMES DAILY.     HYDROcodone-acetaminophen 10-325 MG per tablet  Commonly known as:  NORCO  Take 1 tablet by mouth every 8 (eight) hours as needed for pain.     insulin detemir 100 unit/ml Soln  Commonly known as:  LEVEMIR  Inject 8 Units into the skin at bedtime.     lansoprazole 15 MG capsule  Commonly known as:  PREVACID  Take 15 mg by mouth daily.     multivitamin with minerals Tabs tablet  Take 1 tablet by mouth daily. Centrum Silver     oxyCODONE-acetaminophen 5-325 MG per tablet  Commonly known as:  ROXICET  Take 1 tablet by mouth every 4 (four) hours as needed.     VICTOZA 18 MG/3ML Soln injection  Generic drug:  Liraglutide  Inject 0.6 mg into the skin daily after supper. 4pm       Allergies   Allergen Reactions  . Codeine Hives and Nausea And Vomiting      The results of significant diagnostics from this hospitalization (including imaging, microbiology, ancillary and laboratory) are listed below for reference.    Significant Diagnostic Studies: No results found.  Microbiology: Recent Results (from the past 240 hour(s))  MRSA PCR SCREENING     Status: None   Collection Time    05/16/13  9:12 AM      Result Value Range Status   MRSA by PCR NEGATIVE  NEGATIVE Final   Comment:            The GeneXpert MRSA Assay (FDA     approved for NASAL specimens     only),  is one component of a     comprehensive MRSA colonization     surveillance program. It is not     intended to diagnose MRSA     infection nor to guide or     monitor treatment for     MRSA infections.     Labs: Basic Metabolic Panel:  Recent Labs Lab 05/15/13 2255 05/17/13 0230  NA 134* 136  K 4.5 3.9  CL 101 105  CO2 22 21  GLUCOSE 134* 77  BUN 30* 15  CREATININE 1.59* 1.00  CALCIUM 9.2 9.3   Liver Function Tests:  Recent Labs Lab 05/15/13 2255 05/17/13 0230  AST 18 15  ALT 19 16  ALKPHOS 69 64  BILITOT 0.2* 0.3  PROT 6.3 6.2  ALBUMIN 3.1* 3.1*    Recent Labs Lab 05/15/13 2255  LIPASE 19   No results found for this basename: AMMONIA,  in the last 168 hours CBC:  Recent Labs Lab 05/15/13 2255  05/17/13 1020 05/17/13 2300 05/18/13 0510 05/18/13 1000 05/18/13 2236  WBC 9.3  < > 5.5 6.8 6.4 6.1 7.3  NEUTROABS 6.4  --   --   --   --   --   --   HGB 9.4*  < > 7.9* 8.1* 8.2* 7.9* 7.6*  HCT 30.1*  < > 25.0* 25.9* 26.0* 25.6* 24.1*  MCV 76.4*  < > 76.7* 76.9* 76.9* 77.6* 77.7*  PLT 247  < > 196 206 217 192 224  < > = values in this interval not displayed. Cardiac Enzymes:  Recent Labs Lab 05/15/13 2255  TROPONINI <0.30   BNP: BNP (last 3 results) No results found for this basename: PROBNP,  in the last 8760 hours CBG:  Recent Labs Lab 05/18/13 1120 05/18/13 1627  05/18/13 2006 05/18/13 2338 05/19/13 0424  GLUCAP 102* 99 146* 110* 103*       Signed:  Recie Cirrincione  [facility] 05/19/2013, 9:16 AM

## 2013-06-06 ENCOUNTER — Encounter: Payer: Self-pay | Admitting: Cardiology

## 2013-06-07 ENCOUNTER — Ambulatory Visit (INDEPENDENT_AMBULATORY_CARE_PROVIDER_SITE_OTHER): Payer: Medicare Other | Admitting: Cardiology

## 2013-06-07 ENCOUNTER — Encounter: Payer: Self-pay | Admitting: Cardiology

## 2013-06-07 VITALS — BP 126/60 | HR 72 | Ht 69.0 in | Wt 230.0 lb

## 2013-06-07 DIAGNOSIS — Z7901 Long term (current) use of anticoagulants: Secondary | ICD-10-CM

## 2013-06-07 DIAGNOSIS — I429 Cardiomyopathy, unspecified: Secondary | ICD-10-CM

## 2013-06-07 DIAGNOSIS — I428 Other cardiomyopathies: Secondary | ICD-10-CM

## 2013-06-07 DIAGNOSIS — K922 Gastrointestinal hemorrhage, unspecified: Secondary | ICD-10-CM

## 2013-06-07 DIAGNOSIS — I4891 Unspecified atrial fibrillation: Secondary | ICD-10-CM

## 2013-06-07 NOTE — Assessment & Plan Note (Signed)
As of today the patient's Coumadin is still on hold. I'll obtain recent GI notes. I have mentioned that I think she is held sinus for several years, but I cannot absolutely document this. I will want to try to restart Coumadin if it is safe.

## 2013-06-07 NOTE — Patient Instructions (Signed)
Your physician recommends that you continue on your current medications as directed. Please refer to the Current Medication list given to you today.  Your physician recommends that you schedule a follow-up appointment in: 4 weeks

## 2013-06-07 NOTE — Progress Notes (Signed)
HPI  Patient is seen today post hospitalization. I have spent an extensive amount of time reviewing the patient's hospital records. This includes reviewing the discharge summary and reviewing individual notes to assess the thought process of primary care and GI during her hospitalization. Patient had painless lower GI bleed on May 16, 2013. She did receive vitamin K. Her INR was not excessive. I history she had a colonoscopy in the past in 2011. She did not require blood transfusion. Her colonoscopy in the past had shown pan diverticulosis and internal hemorrhoids. Coumadin was stopped. She stabilized. She is now here for cardiology followup. She saw her gastroenterologist Dr. Benson Norway recently. I will obtain a copy of his recent office note.  In the past the patient had atrial fib and left ventricular dysfunction diagnosed in March, 2011. She was actually cardioverted at that time. As far as I can tell she has been in sinus rhythm since that time. Her left ventricular dysfunction improved and her ejection fraction normalized.  It is of note that the patient had undergone complete knee replacement in October, 2014.   Allergies  Allergen Reactions  . Codeine Hives and Nausea And Vomiting    Current Outpatient Prescriptions  Medication Sig Dispense Refill  . acetaminophen (TYLENOL) 500 MG tablet Take 1,000 mg by mouth 3 (three) times daily as needed for pain.      . carvedilol (COREG) 12.5 MG tablet Take 12.5 mg by mouth 2 (two) times daily with a meal.      . colchicine 0.6 MG tablet Take 0.6 mg by mouth 2 (two) times daily as needed.       . Desoximetasone (TOPICORT) 0.25 % ointment Apply 1 application topically daily. Apply to face and neck      . enalapril (VASOTEC) 10 MG tablet TAKE 1 TABLET (10 MG TOTAL) BY MOUTH 2 (TWO) TIMES DAILY.  60 tablet  0  . Febuxostat (ULORIC PO) Take 5 mg by mouth daily.      Marland Kitchen HYDROcodone-acetaminophen (NORCO) 10-325 MG per tablet Take 1 tablet by mouth  every 8 (eight) hours as needed for pain.      Marland Kitchen insulin detemir (LEVEMIR) 100 unit/ml SOLN Inject 8 Units into the skin at bedtime.  10 mL  0  . lansoprazole (PREVACID) 15 MG capsule Take 15 mg by mouth daily.      . Liraglutide (VICTOZA) 18 MG/3ML SOLN Inject 0.6 mg into the skin daily after supper. 4pm      . Multiple Vitamin (MULITIVITAMIN WITH MINERALS) TABS Take 1 tablet by mouth daily. Centrum Silver      . mupirocin ointment (BACTROBAN) 2 %       . oxyCODONE-acetaminophen (ROXICET) 5-325 MG per tablet Take 1 tablet by mouth every 4 (four) hours as needed.  12 tablet  0   No current facility-administered medications for this visit.    History   Social History  . Marital Status: Divorced    Spouse Name: N/A    Number of Children: N/A  . Years of Education: N/A   Occupational History  . Not on file.   Social History Main Topics  . Smoking status: Current Every Day Smoker -- 0.50 packs/day for 41 years    Types: Cigarettes  . Smokeless tobacco: Never Used  . Alcohol Use: No  . Drug Use: No  . Sexual Activity: No   Other Topics Concern  . Not on file   Social History Narrative  . No narrative on  file    Family History  Problem Relation Age of Onset  . Colon cancer    . Heart disease      Past Medical History  Diagnosis Date  . Hyperkalemia   . Overweight(278.02)   . CHF (congestive heart failure)   . HLD (hyperlipidemia)   . Depression   . History of emphysema   . Atrial fibrillation     Cardioverted to NSR 08/22/2009  . CKD (chronic kidney disease)   . Mitral regurgitation     mild - echo - 1/11 /  Mild, echo, May15, 2012  . Tricuspid regurgitation     moderate - echo - 1/11  . Drug therapy 1/11    coumadin  . Thyroid dysfunction     thyromegally,diffuse,nodule LLL,06/2009  . Ejection fraction < 50%     20-25%, echo.06/2009, /  EF 30-35%, diffuse hypokinesis, echo, Oct 16, 2010  . LFT elevation     06/2009  . Cardiomyopathy   . Tobacco abuse   .  Hypertension   . GERD (gastroesophageal reflux disease)   . Dizziness   . Gout     "left knee" (03/12/2013)  . Pneumonia     "@ least twice" (03/12/2013)  . History of bronchitis   . DM2 (diabetes mellitus, type 2)     fasting 80-100 (03/12/2013)  . Arthritis     "left knee" (03/12/2013)    Past Surgical History  Procedure Laterality Date  . Total knee arthroplasty Left 03/12/2013  . Joint replacement    . Tonsillectomy  1960's  . Cholecystectomy  ~ 1978  . Vaginal hysterectomy  1987  . Tubal ligation    . Cataract extraction w/ intraocular lens  implant, bilateral Bilateral 2010-2012  . Breast biopsy Right 1980's  . Breast lumpectomy Right 1980's  . Total knee arthroplasty Left 03/12/2013    Procedure: TOTAL KNEE ARTHROPLASTY;  Surgeon: Newt Minion, MD;  Location: [facility];  Service: Orthopedics;  Laterality: Left;  Left total knee arthroplasty    Patient Active Problem List   Diagnosis Date Noted  . Protein-calorie malnutrition, severe 05/19/2013  . Lower GI bleeding 05/16/2013  . Lower GI bleed 05/16/2013  . Tobacco abuse   . Furunculosis of multiple sites 08/19/2011  . Rash, skin 08/19/2011  . DM (diabetes mellitus) type II uncontrolled with renal manifestation 08/17/2011  . Dehydration 08/17/2011  . Acute kidney injury 08/17/2011  . CKD (chronic kidney disease) 08/17/2011  . History of atrial fibrillation 08/17/2011  . Cardiomyopathy   . Mitral regurgitation   . Atrial fibrillation   . Tricuspid regurgitation   . Thyroid dysfunction   . LFT elevation   . Long term current use of anticoagulant 07/16/2010  . HYPERKALEMIA 08/15/2009  . OVERWEIGHT/OBESITY 08/15/2009  . DIABETES MELLITUS, TYPE II 10/02/2007  . HYPERLIPIDEMIA 10/02/2007  . DEPRESSION 10/02/2007  . BRONCHITIS 10/02/2007  . EMPHYSEMA 10/02/2007  . GERD 10/02/2007  . BREAST BIOPSY, HX OF 10/02/2007    ROS   patient denies fever, chills, headache, sweats, rash, change in vision, change in  hearing, chest pain, cough, nausea or vomiting, urinary symptoms. All other systems are reviewed and are negative.  PHYSICAL EXAM  patient is oriented to person time and place. Affect is normal. There is no jugulovenous distention. Lungs are clear. Respiratory effort is nonlabored. Cardiac exam reveals S1 and S2. There are no clicks or significant murmurs. Abdomen is soft. There is no peripheral edema. There no musculoskeletal deformities. There are no skin  rashes.    Filed Vitals:   06/07/13 1214  BP: 126/60  Pulse: 72  Height: 5\' 9"  (1.753 m)  Weight: 230 lb (104.327 kg)    there was no need to proceed with EKG today.   ASSESSMENT & PLAN

## 2013-06-07 NOTE — Assessment & Plan Note (Signed)
The patient has been continued on Coumadin appropriately for the atrial fib that was seen in 2011. By my evaluations she has remained in sinus rhythm since that time. I am not able to document what her rhythm was during a recent hospitalization with her GI bleed. Optimally we will get her back on Coumadin. However I decided it is safe for her to remain off for at least several more weeks.

## 2013-06-07 NOTE — Assessment & Plan Note (Addendum)
Patient had very significant recent lower GI bleed. The GI group felt it was most likely diverticular based on colonoscopy in the past. I need to obtain notes from the patient's office visit with GI recently.  As part of today's evaluation I spent far greater than 25 minutes on the total evaluation. More than half of the 25 minutes has been spent with direct contact with the patient discussing all issues.

## 2013-06-07 NOTE — Assessment & Plan Note (Signed)
The patient's ejection fraction fortunately improved over time after she was converted back to sinus rhythm in 2011. Most recent echo was June, 2013. The echo was not repeated at the recent hospitalization. There is no urgency for this as of today.

## 2013-07-07 ENCOUNTER — Encounter: Payer: Self-pay | Admitting: Cardiology

## 2013-07-07 NOTE — Progress Notes (Signed)
I have reviewed and office note from Dr. Benson Norway of GI dated June 02, 2013. She was having no further bleeding. There were plans to have a followup hemoglobin. His note says "she wants to stay off Coumadin and she will discuss the issue with her cardiologist.. "  My impression from this is that it is okay to use Coumadin if her hemoglobin is stable and if that is okay with her  Daryel November, MD

## 2013-07-09 ENCOUNTER — Encounter: Payer: Self-pay | Admitting: Cardiology

## 2013-07-09 ENCOUNTER — Ambulatory Visit (INDEPENDENT_AMBULATORY_CARE_PROVIDER_SITE_OTHER): Payer: Medicare Other | Admitting: Cardiology

## 2013-07-09 VITALS — BP 110/60 | HR 58 | Ht 69.0 in | Wt 235.8 lb

## 2013-07-09 DIAGNOSIS — I4891 Unspecified atrial fibrillation: Secondary | ICD-10-CM

## 2013-07-09 DIAGNOSIS — Z7901 Long term (current) use of anticoagulants: Secondary | ICD-10-CM

## 2013-07-09 DIAGNOSIS — I429 Cardiomyopathy, unspecified: Secondary | ICD-10-CM

## 2013-07-09 DIAGNOSIS — I428 Other cardiomyopathies: Secondary | ICD-10-CM

## 2013-07-09 MED ORDER — ENALAPRIL MALEATE 10 MG PO TABS: 10.0000 mg | ORAL_TABLET | Freq: Two times a day (BID) | ORAL | Status: DC

## 2013-07-09 NOTE — Assessment & Plan Note (Signed)
In the past she had left ventricular dysfunction. However her last echo in June, 2013 revealed an ejection fraction of 55%. No further workup.

## 2013-07-09 NOTE — Progress Notes (Signed)
HPI  Patient is seen today to followup her atrial fibrillation. I saw her last June 07, 2013. She had a significant GI bleed. It was felt most likely to have been diverticular. She's had followup labs with GI and her hemoglobin is quite stable. I was able to review an office note from Dr. Benson Norway. In that note he did not say specifically whether Coumadin can be restarted. However it is my impression that this would not be contraindicated at this time. In the meantime, the patient has restarted Coumadin on her own. I made it very clear to her that she must have her INR followed. He will be better for her to have this done through Dr. [facility] office. This of course is perfectly fine with me. I would like to see her INR kept in the range of 2.0.  Allergies  Allergen Reactions  . Codeine Hives and Nausea And Vomiting    Current Outpatient Prescriptions  Medication Sig Dispense Refill  . acetaminophen (TYLENOL) 500 MG tablet Take 1,000 mg by mouth 3 (three) times daily as needed for pain.      . carvedilol (COREG) 12.5 MG tablet Take 12.5 mg by mouth 2 (two) times daily with a meal.      . colchicine 0.6 MG tablet Take 0.6 mg by mouth 2 (two) times daily as needed.       . Desoximetasone (TOPICORT) 0.25 % ointment Apply 1 application topically daily. Apply to face and neck      . enalapril (VASOTEC) 10 MG tablet TAKE 1 TABLET (10 MG TOTAL) BY MOUTH 2 (TWO) TIMES DAILY.  60 tablet  0  . Febuxostat (ULORIC PO) Take 5 mg by mouth daily.      Marland Kitchen HYDROcodone-acetaminophen (NORCO) 10-325 MG per tablet Take 1 tablet by mouth every 8 (eight) hours as needed for pain.      Marland Kitchen insulin detemir (LEVEMIR) 100 unit/ml SOLN Inject 8 Units into the skin at bedtime.  10 mL  0  . lansoprazole (PREVACID) 15 MG capsule Take 15 mg by mouth daily.      . Liraglutide (VICTOZA) 18 MG/3ML SOLN Inject 0.6 mg into the skin daily after supper. 4pm      . Multiple Vitamin (MULITIVITAMIN WITH MINERALS) TABS Take 1 tablet by  mouth daily. Centrum Silver      . mupirocin ointment (BACTROBAN) 2 %       . oxyCODONE-acetaminophen (ROXICET) 5-325 MG per tablet Take 1 tablet by mouth every 4 (four) hours as needed.  12 tablet  0   No current facility-administered medications for this visit.    History   Social History  . Marital Status: Divorced    Spouse Name: N/A    Number of Children: N/A  . Years of Education: N/A   Occupational History  . Not on file.   Social History Main Topics  . Smoking status: Current Every Day Smoker -- 0.50 packs/day for 41 years    Types: Cigarettes  . Smokeless tobacco: Never Used  . Alcohol Use: No  . Drug Use: No  . Sexual Activity: No   Other Topics Concern  . Not on file   Social History Narrative  . No narrative on file    Family History  Problem Relation Age of Onset  . Colon cancer    . Heart disease      Past Medical History  Diagnosis Date  . Hyperkalemia   . Overweight   . CHF (congestive  heart failure)   . HLD (hyperlipidemia)   . Depression   . History of emphysema   . Atrial fibrillation     Cardioverted to NSR 08/22/2009  . CKD (chronic kidney disease)   . Mitral regurgitation     mild - echo - 1/11 /  Mild, echo, May15, 2012  . Tricuspid regurgitation     moderate - echo - 1/11  . Drug therapy 1/11    coumadin  . Thyroid dysfunction     thyromegally,diffuse,nodule LLL,06/2009  . Ejection fraction < 50%     20-25%, echo.06/2009, /  EF 30-35%, diffuse hypokinesis, echo, Oct 16, 2010  . LFT elevation     06/2009  . Cardiomyopathy   . Tobacco abuse   . Hypertension   . GERD (gastroesophageal reflux disease)   . Dizziness   . Gout     "left knee" (03/12/2013)  . Pneumonia     "@ least twice" (03/12/2013)  . History of bronchitis   . DM2 (diabetes mellitus, type 2)     fasting 80-100 (03/12/2013)  . Arthritis     "left knee" (03/12/2013)    Past Surgical History  Procedure Laterality Date  . Total knee arthroplasty Left  03/12/2013  . Joint replacement    . Tonsillectomy  1960's  . Cholecystectomy  ~ 1978  . Vaginal hysterectomy  1987  . Tubal ligation    . Cataract extraction w/ intraocular lens  implant, bilateral Bilateral 2010-2012  . Breast biopsy Right 1980's  . Breast lumpectomy Right 1980's  . Total knee arthroplasty Left 03/12/2013    Procedure: TOTAL KNEE ARTHROPLASTY;  Surgeon: Newt Minion, MD;  Location: [facility];  Service: Orthopedics;  Laterality: Left;  Left total knee arthroplasty    Patient Active Problem List   Diagnosis Date Noted  . Protein-calorie malnutrition, severe 05/19/2013  . Lower GI bleeding 05/16/2013  . Lower GI bleed 05/16/2013  . Tobacco abuse   . Furunculosis of multiple sites 08/19/2011  . Rash, skin 08/19/2011  . DM (diabetes mellitus) type II uncontrolled with renal manifestation 08/17/2011  . Dehydration 08/17/2011  . Acute kidney injury 08/17/2011  . CKD (chronic kidney disease) 08/17/2011  . Cardiomyopathy   . Mitral regurgitation   . Atrial fibrillation   . Tricuspid regurgitation   . Thyroid dysfunction   . LFT elevation   . Long term current use of anticoagulant 07/16/2010  . HYPERKALEMIA 08/15/2009  . OVERWEIGHT/OBESITY 08/15/2009  . DIABETES MELLITUS, TYPE II 10/02/2007  . HYPERLIPIDEMIA 10/02/2007  . DEPRESSION 10/02/2007  . BRONCHITIS 10/02/2007  . EMPHYSEMA 10/02/2007  . GERD 10/02/2007  . BREAST BIOPSY, HX OF 10/02/2007    ROS   Patient denies fever, chills, headache, sweats, rash, change in vision, change in hearing, chest pain, cough, nausea or vomiting, urinary symptoms. She is walking with a cane. She is recovering from her orthopedic surgery.  PHYSICAL EXAM  Patient is oriented to person time and place. Affect is normal. She's walking with a cane. There is no jugulovenous distention. Lungs are clear. Respiratory effort is nonlabored. Cardiac exam reveals S1 and S2. The rhythm is regular. Abdomen is soft. There is no peripheral  edema.  Filed Vitals:   07/09/13 1136  BP: 110/60  Pulse: 58  Height: 5\' 9"  (1.753 m)  Weight: 235 lb 12.8 oz (106.958 kg)     ASSESSMENT & PLAN

## 2013-07-09 NOTE — Patient Instructions (Addendum)
INR checked with Dr.Sanders    Your physician wants you to follow-up in: 6 months. You will receive a reminder letter in the mail two months in advance. If you don't receive a letter, please call our office to schedule the follow-up appointment.

## 2013-07-09 NOTE — Assessment & Plan Note (Signed)
The patient was cardioverted in 2011. As far as I can tell she is held sinus since then. My choice would be to resume anticoagulation. However if she has recurrent bleeding this will have to be stopped. Coumadin has been started by the patient and we will arrange for early followup testing. She wants this to be done with her primary care office.

## 2013-07-09 NOTE — Assessment & Plan Note (Signed)
The patient has restarted Coumadin on her own. I've asked her to have her labs checked through Dr. [facility] office very soon.

## 2013-08-12 ENCOUNTER — Telehealth: Payer: Self-pay | Admitting: Pharmacist

## 2013-08-12 MED ORDER — APIXABAN 5 MG PO TABS: 5.0000 mg | ORAL_TABLET | Freq: Two times a day (BID) | ORAL | Status: DC

## 2013-08-12 NOTE — Telephone Encounter (Signed)
[name] with [facility] called to let us know they were starting patient on Eliquis 5 mg bid as she refused to go on coumadin.  Renal function, age, and weight appropriate for 5 mg bid.  INR is 1.0 today.  They are getting prior authorization done now, but needed 1-2 weeks of samples if possible.  Samples x 2 weeks left up front for patient.    To Dr. Ron Parker as Juluis Rainier only.

## 2013-08-23 ENCOUNTER — Other Ambulatory Visit: Payer: Self-pay | Admitting: Cardiology

## 2013-09-28 ENCOUNTER — Other Ambulatory Visit: Payer: Self-pay

## 2013-09-28 MED ORDER — ENALAPRIL MALEATE 10 MG PO TABS: 10.0000 mg | ORAL_TABLET | Freq: Two times a day (BID) | ORAL | Status: DC

## 2013-12-15 ENCOUNTER — Other Ambulatory Visit: Payer: Self-pay

## 2013-12-15 DIAGNOSIS — Z1231 Encounter for screening mammogram for malignant neoplasm of breast: Secondary | ICD-10-CM

## 2013-12-24 ENCOUNTER — Encounter (INDEPENDENT_AMBULATORY_CARE_PROVIDER_SITE_OTHER): Payer: Self-pay

## 2013-12-24 ENCOUNTER — Ambulatory Visit
Admission: RE | Admit: 2013-12-24 | Discharge: 2013-12-24 | Disposition: A | Discharge status: Home / Self Care | Payer: Medicare Other | Source: Ambulatory Visit

## 2013-12-24 DIAGNOSIS — Z1231 Encounter for screening mammogram for malignant neoplasm of breast: Secondary | ICD-10-CM

## 2014-01-28 ENCOUNTER — Ambulatory Visit: Payer: Medicare Other | Admitting: Cardiology

## 2014-02-04 ENCOUNTER — Ambulatory Visit: Payer: Medicare Other | Admitting: Cardiology

## 2014-03-11 ENCOUNTER — Ambulatory Visit: Payer: Medicare Other | Admitting: Cardiology

## 2014-03-29 ENCOUNTER — Other Ambulatory Visit: Payer: Self-pay | Admitting: Cardiology

## 2014-05-20 ENCOUNTER — Ambulatory Visit: Payer: Medicare Other | Admitting: Cardiology

## 2014-06-01 ENCOUNTER — Other Ambulatory Visit: Payer: Self-pay | Admitting: Cardiology

## 2014-06-01 MED ORDER — ENALAPRIL MALEATE 10 MG PO TABS: 10.0000 mg | ORAL_TABLET | Freq: Two times a day (BID) | ORAL | Status: DC

## 2014-06-06 ENCOUNTER — Ambulatory Visit (INDEPENDENT_AMBULATORY_CARE_PROVIDER_SITE_OTHER): Payer: Medicare Other | Admitting: Cardiology

## 2014-06-06 ENCOUNTER — Encounter: Payer: Self-pay | Admitting: Cardiology

## 2014-06-06 VITALS — BP 110/68 | HR 75 | Ht 69.0 in | Wt 276.0 lb

## 2014-06-06 DIAGNOSIS — I480 Paroxysmal atrial fibrillation: Secondary | ICD-10-CM

## 2014-06-06 DIAGNOSIS — I429 Cardiomyopathy, unspecified: Secondary | ICD-10-CM

## 2014-06-06 DIAGNOSIS — I447 Left bundle-branch block, unspecified: Secondary | ICD-10-CM

## 2014-06-06 DIAGNOSIS — I340 Nonrheumatic mitral (valve) insufficiency: Secondary | ICD-10-CM

## 2014-06-06 NOTE — Assessment & Plan Note (Signed)
Left bundle-branch block is new since a tracing of December, 2014. We will need to proceed with echo to reassess her LV function. If she were to have a substantial focal wall motion abnormality, we would need to consider more complete workup. Otherwise we will continue to follow.

## 2014-06-06 NOTE — Patient Instructions (Signed)
Your physician recommends that you continue on your current medications as directed. Please refer to the Current Medication list given to you today.  Your physician has requested that you have an echocardiogram. Echocardiography is a painless test that uses sound waves to create images of your heart. It provides your doctor with information about the size and shape of your heart and how well your heart's Ow and valves are working. This procedure takes approximately one hour. There are no restrictions for this procedure.   Your physician wants you to follow-up in: 6 months. You will receive a reminder letter in the mail two months in advance. If you don't receive a letter, please call our office to schedule the follow-up appointment.

## 2014-06-06 NOTE — Progress Notes (Signed)
Patient ID: [name], female   DOB: 09/24/1945, 69 y.o.   MRN: [id number]    HPI Patient is seen to follow-up atrial fibrillation. In January, 2015 she had a significant GI bleed. It was diverticular. Eventually she completely stabilized and her anticoagulation could be restarted. She was treated first with Coumadin and then changed to Eliquis. She is tolerating this well. Her atrial fibrillation is paroxysmal. Also in the past she had left ventricular dysfunction that improved over time. Her last echo was in 2013 with an ejection fraction in the 50% range.  Allergies  Allergen Reactions  . Codeine Hives and Nausea And Vomiting    Current Outpatient Prescriptions  Medication Sig Dispense Refill  . acetaminophen (TYLENOL) 500 MG tablet Take 1,000 mg by mouth 3 (three) times daily as needed for pain.    Marland Kitchen apixaban (ELIQUIS) 5 MG TABS tablet Take 1 tablet (5 mg total) by mouth 2 (two) times daily. 60 tablet   . carvedilol (COREG) 12.5 MG tablet TAKE 1 TABLET (12.5 MG TOTAL) BY MOUTH 2 (TWO) TIMES DAILY. 180 tablet 1  . colchicine 0.6 MG tablet Take 0.6 mg by mouth 2 (two) times daily as needed.     . Desoximetasone (TOPICORT) 0.25 % ointment Apply 1 application topically daily. Apply to face and neck    . enalapril (VASOTEC) 10 MG tablet Take 1 tablet (10 mg total) by mouth 2 (two) times daily. 180 tablet 0  . Febuxostat (ULORIC PO) Take 5 mg by mouth daily.    Marland Kitchen HYDROcodone-acetaminophen (NORCO) 10-325 MG per tablet Take 1 tablet by mouth every 8 (eight) hours as needed for pain.    Marland Kitchen insulin detemir (LEVEMIR) 100 unit/ml SOLN Inject 8 Units into the skin at bedtime. 10 mL 0  . lansoprazole (PREVACID) 15 MG capsule Take 15 mg by mouth daily.    . Liraglutide (VICTOZA) 18 MG/3ML SOLN Inject 0.6 mg into the skin daily after supper. 4pm    . Multiple Vitamin (MULITIVITAMIN WITH MINERALS) TABS Take 1 tablet by mouth daily. Centrum Silver    . mupirocin ointment (BACTROBAN) 2 %     .  oxyCODONE-acetaminophen (ROXICET) 5-325 MG per tablet Take 1 tablet by mouth every 4 (four) hours as needed. 12 tablet 0   No current facility-administered medications for this visit.    History   Social History  . Marital Status: Divorced    Spouse Name: N/A    Number of Children: N/A  . Years of Education: N/A   Occupational History  . Not on file.   Social History Main Topics  . Smoking status: Current Every Day Smoker -- 0.50 packs/day for 41 years    Types: Cigarettes  . Smokeless tobacco: Never Used  . Alcohol Use: No  . Drug Use: No  . Sexual Activity: No   Other Topics Concern  . Not on file   Social History Narrative    Family History  Problem Relation Age of Onset  . Colon cancer    . Heart disease    . Stroke Mother   . Prostate cancer Father     Past Medical History  Diagnosis Date  . Hyperkalemia   . Overweight(278.02)   . CHF (congestive heart failure)   . HLD (hyperlipidemia)   . Depression   . History of emphysema   . Atrial fibrillation     Cardioverted to NSR 08/22/2009  . CKD (chronic kidney disease)   . Mitral regurgitation  mild - echo - 1/11 /  Mild, echo, May15, 2012  . Tricuspid regurgitation     moderate - echo - 1/11  . Drug therapy 1/11    coumadin  . Thyroid dysfunction     thyromegally,diffuse,nodule LLL,06/2009  . Ejection fraction < 50%     20-25%, echo.06/2009, /  EF 30-35%, diffuse hypokinesis, echo, Oct 16, 2010  . LFT elevation     06/2009  . Cardiomyopathy   . Tobacco abuse   . Hypertension   . GERD (gastroesophageal reflux disease)   . Dizziness   . Gout     "left knee" (03/12/2013)  . Pneumonia     "@ least twice" (03/12/2013)  . History of bronchitis   . DM2 (diabetes mellitus, type 2)     fasting 80-100 (03/12/2013)  . Arthritis     "left knee" (03/12/2013)    Past Surgical History  Procedure Laterality Date  . Total knee arthroplasty Left 03/12/2013  . Joint replacement    . Tonsillectomy  1960's    . Cholecystectomy  ~ 1978  . Vaginal hysterectomy  1987  . Tubal ligation    . Cataract extraction w/ intraocular lens  implant, bilateral Bilateral 2010-2012  . Breast biopsy Right 1980's  . Breast lumpectomy Right 1980's  . Total knee arthroplasty Left 03/12/2013    Procedure: TOTAL KNEE ARTHROPLASTY;  Surgeon: Newt Minion, MD;  Location: [place];  Service: Orthopedics;  Laterality: Left;  Left total knee arthroplasty    Patient Active Problem List   Diagnosis Date Noted  . Protein-calorie malnutrition, severe 05/19/2013  . Lower GI bleeding 05/16/2013  . Lower GI bleed 05/16/2013  . Tobacco abuse   . Furunculosis of multiple sites 08/19/2011  . Rash, skin 08/19/2011  . DM (diabetes mellitus) type II uncontrolled with renal manifestation 08/17/2011  . Dehydration 08/17/2011  . Acute kidney injury 08/17/2011  . CKD (chronic kidney disease) 08/17/2011  . Cardiomyopathy   . Mitral regurgitation   . Atrial fibrillation   . Tricuspid regurgitation   . Thyroid dysfunction   . LFT elevation   . Long term current use of anticoagulant 07/16/2010  . HYPERKALEMIA 08/15/2009  . OVERWEIGHT/OBESITY 08/15/2009  . DIABETES MELLITUS, TYPE II 10/02/2007  . HYPERLIPIDEMIA 10/02/2007  . DEPRESSION 10/02/2007  . BRONCHITIS 10/02/2007  . EMPHYSEMA 10/02/2007  . GERD 10/02/2007  . BREAST BIOPSY, HX OF 10/02/2007    ROS  Patient denies fever, chills, headache, sweats, rash, change in vision, change in hearing, chest pain, cough, nausea or vomiting, urinary symptoms. All other systems are reviewed and are negative.  PHYSICAL EXAM Patient is oriented to person time and place. Affect is normal. She is overweight. Head is atraumatic. Sclera and conjunctiva are normal. There is no jugular venous distention. Lungs are clear. Respiratory effort is nonlabored. Cardiac exam reveals S1 and S2. The rhythm is regular. Abdomen is soft. There is no peripheral edema.  Filed Vitals:   06/06/14 1402  BP:  110/68  Pulse: 75  Height: 5\' 9"  (1.753 m)  Weight: 276 lb (125.193 kg)    EKG is done today and reviewed by me. There is sinus rhythm. She has left bundle-branch block today. This has not been seen in the past.  ASSESSMENT & PLAN

## 2014-06-06 NOTE — Assessment & Plan Note (Signed)
In the past she had LV dysfunction. Eventually this improved. Follow-up echo will now be done.

## 2014-06-06 NOTE — Assessment & Plan Note (Signed)
Patient has paroxysmal atrial fibrillation. She's holding sinus rhythm. Her anticoagulation is being continued.

## 2014-06-15 ENCOUNTER — Ambulatory Visit (HOSPITAL_COMMUNITY): Payer: Medicare Other | Attending: Cardiovascular Disease | Admitting: Cardiology

## 2014-06-15 ENCOUNTER — Encounter: Payer: Self-pay | Admitting: Cardiology

## 2014-06-15 DIAGNOSIS — E119 Type 2 diabetes mellitus without complications: Secondary | ICD-10-CM | POA: Diagnosis not present

## 2014-06-15 DIAGNOSIS — I447 Left bundle-branch block, unspecified: Secondary | ICD-10-CM | POA: Insufficient documentation

## 2014-06-15 DIAGNOSIS — I10 Essential (primary) hypertension: Secondary | ICD-10-CM | POA: Diagnosis not present

## 2014-06-15 DIAGNOSIS — E785 Hyperlipidemia, unspecified: Secondary | ICD-10-CM | POA: Insufficient documentation

## 2014-06-15 DIAGNOSIS — Z720 Tobacco use: Secondary | ICD-10-CM | POA: Insufficient documentation

## 2014-06-15 NOTE — Progress Notes (Signed)
Patient ID: ENEIDA BASSI, female   DOB: 1946-04-30, 69 y.o.   MRN: [id number]  The patient has a cardiomyopathy that appeared to improve over time. Denies sore in the office recently, we documented left bundle branch block for the first time. Therefore an echo was done to be sure that there was not a new focal wall motion abnormality. Ejection fraction is read as 25-30%. With my review I may be willing to call at as high as 35%. I believe there has probably been some reduction since her echo in 2013. However I do not see a definite new focal wall motion abnormality. This current wall motion is in the setting of left bundle branch block. The plan at this time will be to continue to carefully readjust medicines for her cardiomyopathy.  Daryel November, MD

## 2014-06-15 NOTE — Progress Notes (Signed)
Echo performed. 

## 2014-08-02 ENCOUNTER — Ambulatory Visit: Payer: Self-pay | Admitting: Cardiology

## 2014-08-02 DIAGNOSIS — I480 Paroxysmal atrial fibrillation: Secondary | ICD-10-CM

## 2014-08-02 DIAGNOSIS — Z7901 Long term (current) use of anticoagulants: Secondary | ICD-10-CM

## 2014-08-26 ENCOUNTER — Encounter: Payer: Self-pay | Admitting: Cardiology

## 2014-08-26 ENCOUNTER — Ambulatory Visit (INDEPENDENT_AMBULATORY_CARE_PROVIDER_SITE_OTHER): Payer: Medicare Other | Admitting: Cardiology

## 2014-08-26 VITALS — BP 124/68 | HR 76 | Ht 69.0 in | Wt 272.8 lb

## 2014-08-26 DIAGNOSIS — R9431 Abnormal electrocardiogram [ECG] [EKG]: Secondary | ICD-10-CM | POA: Diagnosis not present

## 2014-08-26 DIAGNOSIS — I447 Left bundle-branch block, unspecified: Secondary | ICD-10-CM

## 2014-08-26 DIAGNOSIS — R142 Eructation: Secondary | ICD-10-CM

## 2014-08-26 DIAGNOSIS — R079 Chest pain, unspecified: Secondary | ICD-10-CM

## 2014-08-26 DIAGNOSIS — I429 Cardiomyopathy, unspecified: Secondary | ICD-10-CM

## 2014-08-26 NOTE — Patient Instructions (Signed)
Your physician has recommended you make the following change in your medication: take an over the counter heartburn/gerd medication such as omeprazole 30 mins. before breakfast and supper for 2 weeks.  Your physician has requested that you have an adenosine myoview. For further information please visit [email or web address]. Please follow instruction sheet, as given.  Your physician recommends that you schedule a follow-up appointment in: 5 weeks.

## 2014-08-26 NOTE — Assessment & Plan Note (Signed)
The patient's interventricular conduction delay has some effect on her left ventricular function. This is why I am not definitely inclined to recommend ICD given that her EF appears to be in the 35% range currently. She might be a candidate for CRT. However her QRS is only 128 ms.

## 2014-08-26 NOTE — Assessment & Plan Note (Signed)
Ejection fraction seems to be in the range of 35% now. It is varied over time. As of today I will not be changing her meds.

## 2014-08-26 NOTE — Progress Notes (Signed)
Cardiology Office Note   Date:  08/26/2014   ID:  [name], DOB 18-Jul-1945, MRN [id number]  PCP:  Maximino Greenland, MD  Cardiologist:  Dola Argyle, MD   Chief Complaint  Patient presents with  . Appointment    Follow-up cardiomyopathy      History of Present Illness: [name] is a 69 y.o. female who presents today to follow-up cardiomyopathy. When I saw her last in January, 2016, we decided to proceed with a follow-up 2-D echo. I have personally reviewed this. I feel that the EF is probably in the 35% range. As of today I'm not changing any of her medicines. I will have to review again over time if ICD should be considered. However, the patient is complaining today of some epigastric discomfort with belching. I think it is most likely GI in origin. However ischemia has to be kept in mind. I have reviewed the prior records to find data concerning the original assessment of her LV function. I cannot document that catheterization or nuclear study was ever done.  Past Medical History  Diagnosis Date  . Hyperkalemia   . Overweight(278.02)   . CHF (congestive heart failure)   . HLD (hyperlipidemia)   . Depression   . History of emphysema   . Atrial fibrillation     Cardioverted to NSR 08/22/2009  . CKD (chronic kidney disease)   . Mitral regurgitation     mild - echo - 1/11 /  Mild, echo, May15, 2012  . Tricuspid regurgitation     moderate - echo - 1/11  . Drug therapy 1/11    coumadin  . Thyroid dysfunction     thyromegally,diffuse,nodule LLL,06/2009  . Ejection fraction < 50%     20-25%, echo.06/2009, /  EF 30-35%, diffuse hypokinesis, echo, Oct 16, 2010  . LFT elevation     06/2009  . Cardiomyopathy   . Tobacco abuse   . Hypertension   . GERD (gastroesophageal reflux disease)   . Dizziness   . Gout     "left knee" (03/12/2013)  . Pneumonia     "@ least twice" (03/12/2013)  . History of bronchitis   . DM2 (diabetes mellitus, type 2)     fasting 80-100  (03/12/2013)  . Arthritis     "left knee" (03/12/2013)    Past Surgical History  Procedure Laterality Date  . Total knee arthroplasty Left 03/12/2013  . Joint replacement    . Tonsillectomy  1960's  . Cholecystectomy  ~ 1978  . Vaginal hysterectomy  1987  . Tubal ligation    . Cataract extraction w/ intraocular lens  implant, bilateral Bilateral 2010-2012  . Breast biopsy Right 1980's  . Breast lumpectomy Right 1980's  . Total knee arthroplasty Left 03/12/2013    Procedure: TOTAL KNEE ARTHROPLASTY;  Surgeon: Newt Minion, MD;  Location: [facility];  Service: Orthopedics;  Laterality: Left;  Left total knee arthroplasty    Patient Active Problem List   Diagnosis Date Noted  . Left bundle branch block (LBBB) 06/06/2014  . Protein-calorie malnutrition, severe 05/19/2013  . Lower GI bleeding 05/16/2013  . Lower GI bleed 05/16/2013  . Tobacco abuse   . Furunculosis of multiple sites 08/19/2011  . Rash, skin 08/19/2011  . DM (diabetes mellitus) type II uncontrolled with renal manifestation 08/17/2011  . Dehydration 08/17/2011  . Acute kidney injury 08/17/2011  . CKD (chronic kidney disease) 08/17/2011  . Cardiomyopathy   . Mitral regurgitation   . Paroxysmal atrial  fibrillation   . Tricuspid regurgitation   . Thyroid dysfunction   . LFT elevation   . Long term current use of anticoagulant 07/16/2010  . HYPERKALEMIA 08/15/2009  . OVERWEIGHT/OBESITY 08/15/2009  . DIABETES MELLITUS, TYPE II 10/02/2007  . HYPERLIPIDEMIA 10/02/2007  . DEPRESSION 10/02/2007  . BRONCHITIS 10/02/2007  . EMPHYSEMA 10/02/2007  . GERD 10/02/2007  . BREAST BIOPSY, HX OF 10/02/2007      Current Outpatient Prescriptions  Medication Sig Dispense Refill  . acetaminophen (TYLENOL) 500 MG tablet Take 1,000 mg by mouth 3 (three) times daily as needed for pain.    Marland Kitchen apixaban (ELIQUIS) 5 MG TABS tablet Take 1 tablet (5 mg total) by mouth 2 (two) times daily. 60 tablet   . carvedilol (COREG) 12.5 MG tablet  TAKE 1 TABLET (12.5 MG TOTAL) BY MOUTH 2 (TWO) TIMES DAILY. 180 tablet 1  . Cholecalciferol (VITAMIN D) 2000 UNITS CAPS Take 2,000 Units by mouth daily.    . colchicine 0.6 MG tablet Take 0.6 mg by mouth 2 (two) times daily as needed.     . Desoximetasone (TOPICORT) 0.25 % ointment Apply 1 application topically daily. Apply to face and neck    . enalapril (VASOTEC) 10 MG tablet Take 1 tablet (10 mg total) by mouth 2 (two) times daily. 180 tablet 0  . fluocinonide cream (LIDEX) 0.05 % Apply topically as needed. Apply to dry skin as needed  0  . insulin detemir (LEVEMIR) 100 unit/ml SOLN Inject 8 Units into the skin at bedtime. (Patient taking differently: Inject 38 Units into the skin at bedtime. ) 10 mL 0  . Liraglutide (VICTOZA) 18 MG/3ML SOLN Inject 0.6 mg into the skin daily after supper. 4pm    . LIVALO 2 MG TABS Take 2 mg by mouth daily. Patient take one (1) tablet Monday, Wednesday, Friday    . Multiple Vitamin (MULITIVITAMIN WITH MINERALS) TABS Take 1 tablet by mouth daily. Centrum Silver    . mupirocin ointment (BACTROBAN) 2 %     . ranitidine (ZANTAC) 150 MG tablet Take 150 mg by mouth daily.    . vitamin C (ASCORBIC ACID) 500 MG tablet Take 500 mg by mouth daily.     No current facility-administered medications for this visit.    Allergies:   Codeine    Social History:  The patient  reports that she has been smoking Cigarettes.  She has a 20.5 pack-year smoking history. She has never used smokeless tobacco. She reports that she does not drink alcohol or use illicit drugs.   Family History:  The patient's family history includes Colon cancer in an other family member; Heart disease in an other family member; Prostate cancer in her father; Stroke in her mother.    ROS:  Please see the history of present illness.     Patient denies fever, chills, headache, sweats, rash, change in vision, change in hearing, chest pain, cough, nausea or vomiting, urinary symptoms. All other systems are  reviewed and are negative.  PHYSICAL EXAM: VS:  BP 124/68 mmHg  Pulse 76  Ht 5\' 9"  (0000000 m)  Wt 272 lb 12.8 oz (123.741 kg)  BMI 40.27 kg/m2 , Patient is oriented person time and place. Affect is normal. She is overweight. Head is atraumatic. Sclera and conjunctiva are normal. There is no jugular venous distention. Lungs are clear. Respiratory effort is not labored. Cardiac exam reveals S1 and S2. The abdomen is soft. There is no peripheral edema P there are no musculoskeletal deformities.  There are no skin rashes.   EKG:   EKG is done today and reviewed by me. Patient has an interventricular conduction delay of the left bundle-branch block type. There is no significant change when compared to the prior tracing.   Recent Labs: No results found for requested labs within last 365 days.    Lipid Panel    Component Value Date/Time   CHOL  06/27/2009 0328    149        ATP III CLASSIFICATION:  <200     mg/dL   Desirable  200-239  mg/dL   Borderline High  >=240    mg/dL   High          TRIG 72 06/27/2009 0328   HDL 31* 06/27/2009 0328   CHOLHDL 4.8 06/27/2009 0328   VLDL 14 06/27/2009 0328   LDLCALC * 06/27/2009 0328    104        Total Cholesterol/HDL:CHD Risk Coronary Heart Disease Risk Table                     Men   Women  1/2 Average Risk   3.4   3.3  Average Risk       5.0   4.4  2 X Average Risk   9.6   7.1  3 X Average Risk  23.4   11.0        Use the calculated Patient Ratio above and the CHD Risk Table to determine the patient's CHD Risk.        ATP III CLASSIFICATION (LDL):  <100     mg/dL   Optimal  100-129  mg/dL   Near or Above                    Optimal  130-159  mg/dL   Borderline  160-189  mg/dL   High  >190     mg/dL   Very High      Wt Readings from Last 3 Encounters:  08/26/14 272 lb 12.8 oz (123.741 kg)  06/06/14 276 lb (125.193 kg)  07/09/13 235 lb 12.8 oz (106.958 kg)      Current medicines are reviewed  The patient understands her  medications.     ASSESSMENT AND PLAN:

## 2014-08-26 NOTE — Assessment & Plan Note (Signed)
The patient's belching and epigastric discomfort is most likely GI reflux. I have encouraged her to take a PPI twice a day. I gave her instructions. However I do feel that we need to rule out ischemia. Adenosine Myoview scan will be done.

## 2014-08-29 ENCOUNTER — Other Ambulatory Visit: Payer: Self-pay | Admitting: Cardiology

## 2014-09-07 ENCOUNTER — Ambulatory Visit (HOSPITAL_COMMUNITY): Payer: Medicare Other | Attending: Cardiology | Admitting: Radiology

## 2014-09-07 DIAGNOSIS — I447 Left bundle-branch block, unspecified: Secondary | ICD-10-CM | POA: Insufficient documentation

## 2014-09-07 DIAGNOSIS — I10 Essential (primary) hypertension: Secondary | ICD-10-CM | POA: Diagnosis not present

## 2014-09-07 DIAGNOSIS — Z794 Long term (current) use of insulin: Secondary | ICD-10-CM | POA: Insufficient documentation

## 2014-09-07 DIAGNOSIS — R9431 Abnormal electrocardiogram [ECG] [EKG]: Secondary | ICD-10-CM | POA: Insufficient documentation

## 2014-09-07 DIAGNOSIS — E119 Type 2 diabetes mellitus without complications: Secondary | ICD-10-CM | POA: Diagnosis not present

## 2014-09-07 DIAGNOSIS — R079 Chest pain, unspecified: Secondary | ICD-10-CM | POA: Insufficient documentation

## 2014-09-07 MED ORDER — REGADENOSON 0.4 MG/5ML IV SOLN
0.4000 mg | Freq: Once | INTRAVENOUS | Status: AC
Start: 2014-09-07 — End: 2014-09-07
  Administered 2014-09-07: 0.4 mg via INTRAVENOUS

## 2014-09-07 MED ORDER — TECHNETIUM TC 99M SESTAMIBI GENERIC - CARDIOLITE
30.0000 | Freq: Once | INTRAVENOUS | Status: AC | PRN
  Administered 2014-09-07: 30 via INTRAVENOUS

## 2014-09-07 NOTE — Progress Notes (Signed)
[facility] [address] [place], [place] [address] [phone]    Cardiology Nuclear Med Study  [name] is a 69 y.o. female     MRN : [id number]     DOB: 1946/04/26  Procedure Date: 09/07/2014  Nuclear Med Background Indication for Stress Test:  Evaluation for Ischemia and Abnormal EKG History:  No known CAD, PAF, Emphysema Cardiac Risk Factors: Hypertension, IDDM Type 2 and LBBB  Symptoms:  Chest Pain   Nuclear Pre-Procedure Caffeine/Decaff Intake:  None NPO After: 7:00pm   Lungs:  clear O2 Sat: 98% on room air. IV 0.9% NS with Angio Cath:  22g  IV Site: R Antecubital  IV Started by:  Crissie Figures, RN  Chest Size (in):  44 Cup Size: DD  Height: 5\' 9"  (1.753 m)  Weight:  280 lb (127.007 kg)  BMI:  Body mass index is 41.33 kg/(m^2). Tech Comments:  CBG @ 8 AM=114    Nuclear Med Study 1 or 2 day study: 2 day  Stress Test Type:  Lexiscan  Reading MD: N/A  Order Authorizing Provider:  Dola Argyle, MD  Resting Radionuclide: Technetium 69m Sestamibi  Resting Radionuclide Dose: 33.0 mCi on 09/08/14   Stress Radionuclide:  Technetium 29m Sestamibi  Stress Radionuclide Dose: 33.0 mCi on 09/07/14           Stress Protocol Rest HR: 82 Stress HR: 93  Rest BP: 156/76 Stress BP: 150/69  Exercise Time (min): n/a METS: n/a   Predicted Max HR: 151 bpm % Max HR: 61.59 bpm Rate Pressure Product: 14136   Dose of Adenosine (mg):  n/a Dose of Lexiscan: 0.4 mg  Dose of Atropine (mg): n/a Dose of Dobutamine: n/a mcg/kg/min (at max HR)  Stress Test Technologist: Glade Lloyd, BS-ES  Nuclear Technologist:  Earl Many, CNMT     Rest Procedure:  Myocardial perfusion imaging was performed at rest 45 minutes following the intravenous administration of Technetium 35m Sestamibi. Rest ECG: NSR-LBBB  Stress Procedure:  The patient received IV Lexiscan 0.4 mg over 15-seconds.  Technetium 41m Sestamibi injected at 30-seconds.  Quantitative spect images  were obtained after a 45 minute delay.  During the infusion of Lexiscan the patient had a cough and a warm sensation.  These symptoms resolved in recovery.  Stress ECG: No significant change from baseline ECG  QPS Raw Data Images:  Normal; no motion artifact; normal heart/lung ratio. Stress Images:  There is a very small apical defect seen at both rest and stress mild severity with otherwise homogeneous radiotracer uptake at both rest and stress. Rest Images:  As above Subtraction (SDS):  No evidence of ischemia. Transient Ischemic Dilatation (Normal <1.22):  1.18 Lung/Heart Ratio (Normal <0.45):  0.32  Quantitative Gated Spect Images QGS EDV:  163 ml QGS ESV:  97 ml  Impression Exercise Capacity:  Lexiscan with no exercise. BP Response:  Normal blood pressure response. Clinical Symptoms:  Typical symptoms with Lexiscan ECG Impression:  No significant ST segment change suggestive of ischemia. Comparison with Prior Nuclear Study: No images to compare  Overall Impression:  Intermediate risk stress nuclear study Secondary to ejection fraction of 41%. There is no ischemia identified..  LV Ejection Fraction: 41%.  LV Wall Motion:  Septal wall hypokinesis consistent with left bundle branch block with otherwise normal contractile performance  [name], MD

## 2014-09-08 ENCOUNTER — Ambulatory Visit (HOSPITAL_COMMUNITY): Payer: Medicare Other | Attending: Cardiology

## 2014-09-08 MED ORDER — TECHNETIUM TC 99M SESTAMIBI GENERIC - CARDIOLITE
33.0000 | Freq: Once | INTRAVENOUS | Status: AC | PRN
  Administered 2014-09-08: 33 via INTRAVENOUS

## 2014-09-30 ENCOUNTER — Ambulatory Visit: Payer: Medicare Other | Admitting: Cardiology

## 2014-10-17 ENCOUNTER — Ambulatory Visit: Payer: Medicare Other | Admitting: Cardiology

## 2015-02-26 ENCOUNTER — Other Ambulatory Visit: Payer: Self-pay | Admitting: Cardiology

## 2015-03-09 ENCOUNTER — Telehealth: Payer: Self-pay

## 2015-03-09 NOTE — Telephone Encounter (Signed)
New message      Former Dr Ron Parker patient.  She got a letter last week stating Dr Ron Parker was retiring.  She want to know who did he recommend for her to see?  She want someone like Dr Ron Parker (bedside manner).  She needs to make a follow up appt, but want a nurse to answer these questions.  OK to wait until Jeani Hawking returns to call

## 2015-03-09 NOTE — Telephone Encounter (Signed)
Spoke with pt and informed her that Dr. Ron Parker had not made any suggestions in her chart in regards to which cardiologist she should see now that he is retired. Pt stated that she wanted someone similar to Dr. Ron Parker (in regards to bedside manner). Advised pt that ultimately it is up to her who she would like to see. Pt willing to see Dr. Irish Lack. Pt will call back tomorrow to schedule appt as she needs to schedule a mammogram as well and would like for those appts to be on the same day. Dr. Irish Lack would like for any pt's transitioning from Dr. Ron Parker to be placed in 30 minute slots.

## 2015-03-14 ENCOUNTER — Other Ambulatory Visit: Payer: Self-pay

## 2015-03-14 DIAGNOSIS — Z1231 Encounter for screening mammogram for malignant neoplasm of breast: Secondary | ICD-10-CM

## 2015-03-22 ENCOUNTER — Ambulatory Visit
Admission: RE | Admit: 2015-03-22 | Discharge: 2015-03-22 | Disposition: A | Discharge status: Home / Self Care | Payer: Medicare Other | Source: Ambulatory Visit

## 2015-03-22 DIAGNOSIS — Z1231 Encounter for screening mammogram for malignant neoplasm of breast: Secondary | ICD-10-CM

## 2015-03-27 ENCOUNTER — Ambulatory Visit: Payer: Medicare Other | Admitting: Interventional Cardiology

## 2015-05-04 ENCOUNTER — Ambulatory Visit: Payer: Medicare Other | Admitting: Interventional Cardiology

## 2015-05-24 ENCOUNTER — Other Ambulatory Visit: Payer: Self-pay | Admitting: Cardiology

## 2015-06-16 ENCOUNTER — Ambulatory Visit: Payer: Medicare Other | Admitting: Interventional Cardiology

## 2015-06-21 ENCOUNTER — Telehealth: Payer: Self-pay | Admitting: Interventional Cardiology

## 2015-06-21 NOTE — Telephone Encounter (Signed)
Follow Up   Nurse with [facility] called request a call back to discuss the pt has CHF and if so please send the Ejection fraction    FAX: [phone]

## 2015-06-22 DIAGNOSIS — E1122 Type 2 diabetes mellitus with diabetic chronic kidney disease: Secondary | ICD-10-CM | POA: Diagnosis not present

## 2015-06-22 DIAGNOSIS — N183 Chronic kidney disease, stage 3 (moderate): Secondary | ICD-10-CM | POA: Diagnosis not present

## 2015-06-22 DIAGNOSIS — I130 Hypertensive heart and chronic kidney disease with heart failure and stage 1 through stage 4 chronic kidney disease, or unspecified chronic kidney disease: Secondary | ICD-10-CM | POA: Diagnosis not present

## 2015-06-22 DIAGNOSIS — M791 Myalgia: Secondary | ICD-10-CM | POA: Diagnosis not present

## 2015-06-22 DIAGNOSIS — N08 Glomerular disorders in diseases classified elsewhere: Secondary | ICD-10-CM | POA: Diagnosis not present

## 2015-06-22 NOTE — Telephone Encounter (Signed)
Cover sheet faxed over to Havery Moros with [facility] asking her to send the EF request form over so I can then  Return the Echo to her.

## 2015-07-05 ENCOUNTER — Other Ambulatory Visit: Payer: Self-pay | Admitting: Cardiology

## 2015-07-17 ENCOUNTER — Ambulatory Visit (INDEPENDENT_AMBULATORY_CARE_PROVIDER_SITE_OTHER): Payer: Medicare Other | Admitting: Interventional Cardiology

## 2015-07-17 ENCOUNTER — Encounter: Payer: Self-pay | Admitting: Interventional Cardiology

## 2015-07-17 VITALS — BP 120/70 | HR 60 | Ht 69.0 in | Wt 276.0 lb

## 2015-07-17 DIAGNOSIS — I429 Cardiomyopathy, unspecified: Secondary | ICD-10-CM

## 2015-07-17 DIAGNOSIS — E119 Type 2 diabetes mellitus without complications: Secondary | ICD-10-CM | POA: Diagnosis not present

## 2015-07-17 DIAGNOSIS — I480 Paroxysmal atrial fibrillation: Secondary | ICD-10-CM

## 2015-07-17 DIAGNOSIS — I447 Left bundle-branch block, unspecified: Secondary | ICD-10-CM | POA: Diagnosis not present

## 2015-07-17 DIAGNOSIS — Z720 Tobacco use: Secondary | ICD-10-CM

## 2015-07-17 DIAGNOSIS — Z7901 Long term (current) use of anticoagulants: Secondary | ICD-10-CM

## 2015-07-17 DIAGNOSIS — I428 Other cardiomyopathies: Secondary | ICD-10-CM

## 2015-07-17 NOTE — Progress Notes (Signed)
Patient ID: [name], female   DOB: 1945-07-01, 70 y.o.   MRN: [id number]     Cardiology Office Note   Date:  07/17/2015   ID:  [name], DOB [birth year], MRN [id number]  PCP:  Maximino Greenland, MD    No chief complaint on file. cardiomyopathy   Wt Readings from Last 3 Encounters:  07/17/15 276 lb (125.193 kg)  09/07/14 280 lb (127.007 kg)  08/26/14 272 lb 12.8 oz (123.741 kg)       History of Present Illness: [name] is a 70 y.o. female  With a  Nonischemic cardiomyopathy.  She had a negative nuclear stress test in 2016.    She has had a cough of late.  She has had a cold.  She has had pneumonia in the pst, most recently in 2016 October.    Her balance has been off as well.   She has not required diuretics for some time.  She has not had any fluid retention.  In the psat, she has had [facility] with fluid retention, but has not had any recently.    She walks for exercise, but only short distances.  She works in the yard.  Longest walks are shopping trips.  No dedicated walking.   No orthopnea.      Past Medical History  Diagnosis Date  . Hyperkalemia   . Overweight(278.02)   . CHF (congestive heart failure) ([facility])   . HLD (hyperlipidemia)   . Depression   . History of emphysema   . Atrial fibrillation ([facility])     Cardioverted to NSR 08/22/2009  . CKD (chronic kidney disease)   . Mitral regurgitation     mild - echo - 1/11 /  Mild, echo, May15, 2012  . Tricuspid regurgitation     moderate - echo - 1/11  . Drug therapy 1/11    coumadin  . Thyroid dysfunction     thyromegally,diffuse,nodule LLL,06/2009  . Ejection fraction < 50%     20-25%, echo.06/2009, /  EF 30-35%, diffuse hypokinesis, echo, Oct 16, 2010  . LFT elevation     06/2009  . Cardiomyopathy   . Tobacco abuse   . Hypertension   . GERD (gastroesophageal reflux disease)   . Dizziness   . Gout     "left knee" (03/12/2013)  . Pneumonia     "@ least twice" (03/12/2013)  . History of  bronchitis   . DM2 (diabetes mellitus, type 2) ([facility])     fasting 80-100 (03/12/2013)  . Arthritis     "left knee" (03/12/2013)    Past Surgical History  Procedure Laterality Date  . Total knee arthroplasty Left 03/12/2013  . Joint replacement    . Tonsillectomy  1960's  . Cholecystectomy  ~ 1978  . Vaginal hysterectomy  1987  . Tubal ligation    . Cataract extraction w/ intraocular lens  implant, bilateral Bilateral 2010-2012  . Breast biopsy Right 1980's  . Breast lumpectomy Right 1980's  . Total knee arthroplasty Left 03/12/2013    Procedure: TOTAL KNEE ARTHROPLASTY;  Surgeon: Newt Minion, MD;  Location: [facility];  Service: Orthopedics;  Laterality: Left;  Left total knee arthroplasty     Current Outpatient Prescriptions  Medication Sig Dispense Refill  . acetaminophen (TYLENOL) 500 MG tablet Take 1,000 mg by mouth 3 (three) times daily as needed for pain.    Marland Kitchen apixaban (ELIQUIS) 5 MG TABS tablet Take 1 tablet (5 mg total) by mouth 2 (two) times  daily. 60 tablet   . carvedilol (COREG) 12.5 MG tablet TAKE 1 TABLET (12.5 MG TOTAL) BY MOUTH 2 (TWO) TIMES DAILY. 60 tablet 0  . Cholecalciferol (VITAMIN D) 2000 UNITS CAPS Take 2,000 Units by mouth daily.    . colchicine 0.6 MG tablet Take 0.6 mg by mouth 2 (two) times daily as needed (GOUT).     . Desoximetasone (TOPICORT) 0.25 % ointment Apply 1 application topically daily. Apply to face and neck    . enalapril (VASOTEC) 10 MG tablet TAKE 1 TABLET (10 MG TOTAL) BY MOUTH 2 (TWO) TIMES DAILY. 60 tablet 0  . fluocinonide cream (LIDEX) 0.05 % Apply topically as needed. Apply to dry skin as needed  0  . Liraglutide (VICTOZA) 18 MG/3ML SOLN Inject 0.6 mg into the skin daily after supper. 4pm    . LIVALO 2 MG TABS Take 2 mg by mouth daily. Patient take one (1) tablet Monday, Wednesday, Friday    . Multiple Vitamin (MULITIVITAMIN WITH MINERALS) TABS Take 1 tablet by mouth daily. Centrum Silver    . mupirocin ointment (BACTROBAN) 2 % Apply 1  application topically daily.     . ranitidine (ZANTAC) 150 MG tablet Take 150 mg by mouth daily.    . vitamin C (ASCORBIC ACID) 500 MG tablet Take 500 mg by mouth daily.    Marland Kitchen LEVEMIR FLEXTOUCH 100 UNIT/ML Pen Inject 30 Units into the skin daily at 10 pm.     . ONETOUCH VERIO test strip USE AS DIRECTED TO CHECK BLOOD SUGARS 1 TIME PER DAY DX: E11.22  11   No current facility-administered medications for this visit.    Allergies:   Codeine    Social History:  The patient  reports that she has been smoking Cigarettes.  She has a 20.5 pack-year smoking history. She has never used smokeless tobacco. She reports that she does not drink alcohol or use illicit drugs.   Family History:  The patient's family history includes Heart attack in her son; Hypertension in her sister and son; Prostate cancer in her father; Stroke in her mother.    ROS:  Please see the history of present illness.   Otherwise, review of systems are positive for cough.   All other systems are reviewed and negative.    PHYSICAL EXAM: VS:  BP 120/70 mmHg  Pulse 60  Ht 5\' 9"  (1.753 m)  Wt 276 lb (125.193 kg)  BMI 40.74 kg/m2 , BMI Body mass index is 40.74 kg/(m^2). GEN: Well nourished, well developed, in no acute distress HEENT: normal Neck: no JVD, carotid bruits, or masses Cardiac: RRR; no murmurs, rubs, or gallops,no edema  Respiratory:  clear to auscultation bilaterally, normal work of breathing GI: soft, nontender, nondistended, + BS MS: no deformity or atrophy Skin: warm and dry, no rash Neuro:  Strength and sensation are intact Psych: euthymic mood, full affect   EKG:   The ekg ordered today demonstrates NSR, LBBB   Recent Labs: No results found for requested labs within last 365 days.   Lipid Panel    Component Value Date/Time   CHOL  06/27/2009 0328    149        ATP III CLASSIFICATION:  <200     mg/dL   Desirable  200-239  mg/dL   Borderline High  >=240    mg/dL   High          TRIG 72  06/27/2009 0328   HDL 31* 06/27/2009 0328   CHOLHDL 4.8  06/27/2009 0328   VLDL 14 06/27/2009 0328   LDLCALC * 06/27/2009 0328    104        Total Cholesterol/HDL:CHD Risk Coronary Heart Disease Risk Table                     Men   Women  1/2 Average Risk   3.4   3.3  Average Risk       5.0   4.4  2 X Average Risk   9.6   7.1  3 X Average Risk  23.4   11.0        Use the calculated Patient Ratio above and the CHD Risk Table to determine the patient's CHD Risk.        ATP III CLASSIFICATION (LDL):  <100     mg/dL   Optimal  100-129  mg/dL   Near or Above                    Optimal  130-159  mg/dL   Borderline  160-189  mg/dL   High  >190     mg/dL   Very High     Other studies Reviewed: Additional studies/ records that were reviewed today with results demonstrating: LBBB is old.   ASSESSMENT AND PLAN:  1. Nonischemic cardiomyopathy:  COntinue medical therapy.  Currently appears to be NYHA class I, although I suspect she may minimize her activities to avoid symptoms. She does not do any dedicated exercise. Will repeat echocardiogram. Potentially, she would be a candidate for a biventricular ICD and referral to EP. 2. Tobacco abuse: She needs to stop smoking. 3. Hypertension: Continue current medicines. Blood pressure is well controlled. 4. DM: Continue medical therapy.  Followed by primary care doctor. 5. Paroxysmal atrial fibrillation: No palpitations. Continue Eliquis for stroke prevention.   Current medicines are reviewed at length with the patient today.  The patient concerns regarding her medicines were addressed.  The following changes have been made:    Labs/ tests ordered today include:  No orders of the defined types were placed in this encounter.    Recommend 150 minutes/week of aerobic exercise Low fat, low carb, high fiber diet recommended  Disposition:   FU in 6 months   Teresita Madura., MD  07/17/2015 3:40 PM    [facility]  [facility], [place], [place]  [address] Phone: [phone]; Fax: [phone]

## 2015-07-17 NOTE — Patient Instructions (Signed)
Medication Instructions:  Same-no changes  Labwork: None  Testing/Procedures: Your physician has requested that you have an echocardiogram. Echocardiography is a painless test that uses sound waves to create images of your heart. It provides your doctor with information about the size and shape of your heart and how well your heart's Sanjuan and valves are working. This procedure takes approximately one hour. There are no restrictions for this procedure.   Follow-Up: Your physician wants you to follow-up in: 6 months. You will receive a reminder letter in the mail two months in advance. If you don't receive a letter, please call our office to schedule the follow-up appointment.     If you need a refill on your cardiac medications before your next appointment, please call your pharmacy.   

## 2015-07-20 ENCOUNTER — Telehealth: Payer: Self-pay | Admitting: Interventional Cardiology

## 2015-07-20 NOTE — Telephone Encounter (Signed)
  New Message  Caryl Pina- RN w/ [facility] calling to get confirmation that pt has dx of HF and most recent EF. Please advise.   Fax- [phone]

## 2015-07-28 ENCOUNTER — Other Ambulatory Visit: Payer: Self-pay

## 2015-07-28 ENCOUNTER — Ambulatory Visit (HOSPITAL_COMMUNITY): Payer: Medicare Other | Attending: Cardiovascular Disease

## 2015-07-28 DIAGNOSIS — Z8249 Family history of ischemic heart disease and other diseases of the circulatory system: Secondary | ICD-10-CM | POA: Diagnosis not present

## 2015-07-28 DIAGNOSIS — I429 Cardiomyopathy, unspecified: Secondary | ICD-10-CM | POA: Insufficient documentation

## 2015-07-28 DIAGNOSIS — E785 Hyperlipidemia, unspecified: Secondary | ICD-10-CM | POA: Diagnosis not present

## 2015-07-28 DIAGNOSIS — I509 Heart failure, unspecified: Secondary | ICD-10-CM | POA: Diagnosis not present

## 2015-07-28 DIAGNOSIS — Z720 Tobacco use: Secondary | ICD-10-CM | POA: Insufficient documentation

## 2015-07-28 DIAGNOSIS — I517 Cardiomegaly: Secondary | ICD-10-CM | POA: Insufficient documentation

## 2015-07-28 DIAGNOSIS — E1122 Type 2 diabetes mellitus with diabetic chronic kidney disease: Secondary | ICD-10-CM | POA: Diagnosis not present

## 2015-07-28 DIAGNOSIS — I340 Nonrheumatic mitral (valve) insufficiency: Secondary | ICD-10-CM | POA: Diagnosis not present

## 2015-07-28 DIAGNOSIS — I428 Other cardiomyopathies: Secondary | ICD-10-CM

## 2015-07-28 DIAGNOSIS — I071 Rheumatic tricuspid insufficiency: Secondary | ICD-10-CM | POA: Insufficient documentation

## 2015-08-03 ENCOUNTER — Other Ambulatory Visit: Payer: Self-pay | Admitting: Cardiology

## 2015-08-08 ENCOUNTER — Telehealth: Payer: Self-pay | Admitting: Interventional Cardiology

## 2015-08-08 NOTE — Telephone Encounter (Signed)
New message      Calling to confirm diagnosis of CHF and get most recent EF.  She says she does not have the ejection fraction form.  Please fax to [phone]

## 2015-08-10 DIAGNOSIS — R0789 Other chest pain: Secondary | ICD-10-CM | POA: Diagnosis not present

## 2015-08-10 DIAGNOSIS — R1013 Epigastric pain: Secondary | ICD-10-CM | POA: Diagnosis not present

## 2015-08-10 DIAGNOSIS — E785 Hyperlipidemia, unspecified: Secondary | ICD-10-CM | POA: Diagnosis not present

## 2015-08-23 ENCOUNTER — Other Ambulatory Visit: Payer: Self-pay | Admitting: Cardiology

## 2015-09-27 DIAGNOSIS — N08 Glomerular disorders in diseases classified elsewhere: Secondary | ICD-10-CM | POA: Diagnosis not present

## 2015-09-27 DIAGNOSIS — Z0000 Encounter for general adult medical examination without abnormal findings: Secondary | ICD-10-CM | POA: Diagnosis not present

## 2015-09-27 DIAGNOSIS — E1122 Type 2 diabetes mellitus with diabetic chronic kidney disease: Secondary | ICD-10-CM | POA: Diagnosis not present

## 2015-09-27 DIAGNOSIS — E559 Vitamin D deficiency, unspecified: Secondary | ICD-10-CM | POA: Diagnosis not present

## 2015-09-27 DIAGNOSIS — N183 Chronic kidney disease, stage 3 (moderate): Secondary | ICD-10-CM | POA: Diagnosis not present

## 2015-09-27 DIAGNOSIS — I1310 Hypertensive heart and chronic kidney disease without heart failure, with stage 1 through stage 4 chronic kidney disease, or unspecified chronic kidney disease: Secondary | ICD-10-CM | POA: Diagnosis not present

## 2015-10-10 ENCOUNTER — Encounter: Payer: Self-pay | Admitting: Interventional Cardiology

## 2015-11-08 DIAGNOSIS — K921 Melena: Secondary | ICD-10-CM | POA: Diagnosis not present

## 2015-11-08 DIAGNOSIS — E1122 Type 2 diabetes mellitus with diabetic chronic kidney disease: Secondary | ICD-10-CM | POA: Diagnosis not present

## 2015-11-08 DIAGNOSIS — N183 Chronic kidney disease, stage 3 (moderate): Secondary | ICD-10-CM | POA: Diagnosis not present

## 2015-11-08 DIAGNOSIS — N08 Glomerular disorders in diseases classified elsewhere: Secondary | ICD-10-CM | POA: Diagnosis not present

## 2015-12-27 DIAGNOSIS — N183 Chronic kidney disease, stage 3 (moderate): Secondary | ICD-10-CM | POA: Diagnosis not present

## 2015-12-27 DIAGNOSIS — I1310 Hypertensive heart and chronic kidney disease without heart failure, with stage 1 through stage 4 chronic kidney disease, or unspecified chronic kidney disease: Secondary | ICD-10-CM | POA: Diagnosis not present

## 2015-12-27 DIAGNOSIS — E1122 Type 2 diabetes mellitus with diabetic chronic kidney disease: Secondary | ICD-10-CM | POA: Diagnosis not present

## 2015-12-27 DIAGNOSIS — N08 Glomerular disorders in diseases classified elsewhere: Secondary | ICD-10-CM | POA: Diagnosis not present

## 2015-12-27 DIAGNOSIS — R1013 Epigastric pain: Secondary | ICD-10-CM | POA: Diagnosis not present

## 2016-02-14 ENCOUNTER — Encounter: Payer: Self-pay | Admitting: Neurology

## 2016-02-18 NOTE — Progress Notes (Signed)
Patient ID: [name], female   DOB: 06-22-1945, 70 y.o.   MRN: [id number]     Cardiology Office Note   Date:  02/19/2016   ID:  [name], DOB 1946/01/14, MRN [id number]  PCP:  Shelby Greenland, MD    No chief complaint on file. cardiomyopathy   Wt Readings from Last 3 Encounters:  02/19/16 262 lb (118.8 kg)  07/17/15 276 lb (125.2 kg)  09/07/14 280 lb (127 kg)       History of Present Illness: [name] is a 70 y.o. female  With a  Nonischemic cardiomyopathy, EF 25-30% in 2017.  She had a negative nuclear stress test in 2016.    She has had a cough of late.  She has had a cold.  She has had pneumonia in the pst, most recently in 2016 October.    Her balance has been off as well.   She has not required diuretics for some time.  She has not had any fluid retention.  In the pas t, she has had [facility] with fluid retention, but has not had any recently.    She walks for exercise, but only short distances.  She works in the yard.  Longest walks are shopping trips.  No dedicated walking.   No orthopnea.  She continues to smoke- but is gradually cutting back rather than stopping all at once.  She has lost weight since the last visit.      Past Medical History:  Diagnosis Date  . Arthritis    "left knee" (03/12/2013)  . Atrial fibrillation ([place])    Cardioverted to NSR 08/22/2009  . Cardiomyopathy   . CHF (congestive heart failure) ([place])   . CKD (chronic kidney disease)   . Depression   . Dizziness   . DM2 (diabetes mellitus, type 2) ([place])    fasting 80-100 (03/12/2013)  . Drug therapy 1/11   coumadin  . Ejection fraction < 50%    20-25%, echo.06/2009, /  EF 30-35%, diffuse hypokinesis, echo, Oct 16, 2010  . GERD (gastroesophageal reflux disease)   . Gout    "left knee" (03/12/2013)  . History of bronchitis   . History of emphysema   . HLD (hyperlipidemia)   . Hyperkalemia   . Hypertension   . LFT elevation    06/2009  . Mitral regurgitation    mild -  echo - 1/11 /  Mild, echo, May15, 2012  . Overweight(278.02)   . Pneumonia    "@ least twice" (03/12/2013)  . Thyroid dysfunction    thyromegally,diffuse,nodule LLL,06/2009  . Tobacco abuse   . Tricuspid regurgitation    moderate - echo - 1/11    Past Surgical History:  Procedure Laterality Date  . BREAST BIOPSY Right 1980's  . BREAST LUMPECTOMY Right 1980's  . CATARACT EXTRACTION W/ INTRAOCULAR LENS  IMPLANT, BILATERAL Bilateral 2010-2012  . CHOLECYSTECTOMY  ~ 1978  . JOINT REPLACEMENT    . TONSILLECTOMY  1960's  . TOTAL KNEE ARTHROPLASTY Left 03/12/2013  . TOTAL KNEE ARTHROPLASTY Left 03/12/2013   Procedure: TOTAL KNEE ARTHROPLASTY;  Surgeon: Newt Minion, MD;  Location: [place];  Service: Orthopedics;  Laterality: Left;  Left total knee arthroplasty  . TUBAL LIGATION    . VAGINAL HYSTERECTOMY  1987     Current Outpatient Prescriptions  Medication Sig Dispense Refill  . acetaminophen (TYLENOL) 500 MG tablet Take 1,000 mg by mouth 3 (three) times daily as needed for pain.    Marland Kitchen apixaban (  ELIQUIS) 5 MG TABS tablet Take 1 tablet (5 mg total) by mouth 2 (two) times daily. 60 tablet   . carvedilol (COREG) 12.5 MG tablet TAKE 1 TABLET (12.5 MG TOTAL) BY MOUTH 2 (TWO) TIMES DAILY. 60 tablet 0  . carvedilol (COREG) 12.5 MG tablet TAKE 1 TABLET (12.5 MG TOTAL) BY MOUTH 2 (TWO) TIMES DAILY. 180 tablet 3  . Cholecalciferol (VITAMIN D) 2000 UNITS CAPS Take 2,000 Units by mouth daily.    . colchicine 0.6 MG tablet Take 0.6 mg by mouth 2 (two) times daily as needed (GOUT).     . Desoximetasone (TOPICORT) 0.25 % ointment Apply 1 application topically daily. Apply to face and neck    . enalapril (VASOTEC) 10 MG tablet TAKE 1 TABLET (10 MG TOTAL) BY MOUTH 2 (TWO) TIMES DAILY. 60 tablet 6  . fluocinonide cream (LIDEX) 0.05 % Apply topically as needed. Apply to dry skin as needed  0  . LEVEMIR FLEXTOUCH 100 UNIT/ML Pen Inject 30 Units into the skin daily at 10 pm.     . Liraglutide (VICTOZA) 18  MG/3ML SOLN Inject 0.6 mg into the skin daily after supper. 4pm    . LIVALO 2 MG TABS Take 2 mg by mouth daily. Patient take one (1) tablet Monday, Wednesday, Friday    . Multiple Vitamin (MULITIVITAMIN WITH MINERALS) TABS Take 1 tablet by mouth daily. Centrum Silver    . mupirocin ointment (BACTROBAN) 2 % Apply 1 application topically daily.     [name] VERIO test strip USE AS DIRECTED TO CHECK BLOOD SUGARS 1 TIME PER DAY DX: E11.22  11  . ranitidine (ZANTAC) 150 MG tablet Take 150 mg by mouth daily.    . vitamin C (ASCORBIC ACID) 500 MG tablet Take 500 mg by mouth daily.     No current facility-administered medications for this visit.     Allergies:   Codeine    Social History:  The patient  reports that she has been smoking Cigarettes.  She has a 20.50 pack-year smoking history. She has never used smokeless tobacco. She reports that she does not drink alcohol or use drugs.   Family History:  The patient's family history includes Heart attack in her son; Hypertension in her sister and son; Prostate cancer in her father; Stroke in her maternal grandfather, mother, and paternal grandmother.    ROS:  Please see the history of present illness.   Otherwise, review of systems are positive for cough.   All other systems are reviewed and negative.    PHYSICAL EXAM: VS:  BP 110/60   Pulse 72   Ht 5\' 9"  (1.753 m)   Wt 262 lb (118.8 kg)   BMI 38.69 kg/m  , BMI Body mass index is 38.69 kg/m. GEN: Well nourished, well developed, in no acute distress  HEENT: normal  Neck: no JVD, carotid bruits, or masses Cardiac: RRR; no murmurs, rubs, or gallops,no edema  Respiratory:  clear to auscultation bilaterally, normal work of breathing GI: soft, nontender, nondistended, + BS MS: no deformity or atrophy  Skin: warm and dry, no rash Neuro:  Strength and sensation are intact Psych: euthymic mood, full affect   EKG:   The ekg ordered previously demonstrates NSR, LBBB   Recent Labs: No  results found for requested labs within last 8760 hours.   Lipid Panel    Component Value Date/Time   CHOL  06/27/2009 0328    149        ATP III CLASSIFICATION:  <  200     mg/dL   Desirable  200-239  mg/dL   Borderline High  >=240    mg/dL   High          TRIG 72 06/27/2009 0328   HDL 31 (L) 06/27/2009 0328   CHOLHDL 4.8 06/27/2009 0328   VLDL 14 06/27/2009 0328   LDLCALC (H) 06/27/2009 0328    104        Total Cholesterol/HDL:CHD Risk Coronary Heart Disease Risk Table                     Men   Women  1/2 Average Risk   3.4   3.3  Average Risk       5.0   4.4  2 X Average Risk   9.6   7.1  3 X Average Risk  23.4   11.0        Use the calculated Patient Ratio above and the CHD Risk Table to determine the patient's CHD Risk.        ATP III CLASSIFICATION (LDL):  <100     mg/dL   Optimal  100-129  mg/dL   Near or Above                    Optimal  130-159  mg/dL   Borderline  160-189  mg/dL   High  >190     mg/dL   Very High     Other studies Reviewed: Additional studies/ records that were reviewed today with results demonstrating: LBBB is old.   ASSESSMENT AND PLAN:  1. Nonischemic cardiomyopathy:  COntinue medical therapy.  Currently appears to be NYHA class I, although I suspect she may minimize her activities to avoid symptoms. She does not do any dedicated exercise. Will repeat echocardiogram. Potentially, she would be a candidate for a biventricular ICD and referral to EP.  No need for diuretics at this time.  She will call us if she has sx of fluid retention. 2. Tobacco abuse: She needs to stop smoking. 3. Hypertension: Continue current medicines. Blood pressure is well controlled. 4. DM: Continue medical therapy.  Followed by primary care doctor. 5. Paroxysmal atrial fibrillation: No palpitations. Continue Eliquis for stroke prevention.   Current medicines are reviewed at length with the patient today.  The patient concerns regarding her medicines were  addressed.  The following changes have been made:    Labs/ tests ordered today include:  No orders of the defined types were placed in this encounter.   Recommend 150 minutes/week of aerobic exercise Low fat, low carb, high fiber diet recommended  Disposition:   FU in 6 months   Signed, Larae Grooms, MD  02/19/2016 12:37 PM    [facility] [address], [place], [place]  [address] Phone: [phone]; Fax: [phone]

## 2016-02-19 ENCOUNTER — Encounter: Payer: Self-pay | Admitting: Interventional Cardiology

## 2016-02-19 ENCOUNTER — Ambulatory Visit (INDEPENDENT_AMBULATORY_CARE_PROVIDER_SITE_OTHER): Payer: Medicare Other | Admitting: Interventional Cardiology

## 2016-02-19 VITALS — BP 110/60 | HR 72 | Ht 69.0 in | Wt 262.0 lb

## 2016-02-19 DIAGNOSIS — Z720 Tobacco use: Secondary | ICD-10-CM

## 2016-02-19 DIAGNOSIS — I480 Paroxysmal atrial fibrillation: Secondary | ICD-10-CM

## 2016-02-19 DIAGNOSIS — E119 Type 2 diabetes mellitus without complications: Secondary | ICD-10-CM | POA: Diagnosis not present

## 2016-02-19 DIAGNOSIS — I429 Cardiomyopathy, unspecified: Secondary | ICD-10-CM

## 2016-02-19 NOTE — Patient Instructions (Signed)
Medication Instructions:  Same-no changes  Labwork: None  Testing/Procedures: None  Follow-Up: In 6 months.     If you need a refill on your cardiac medications before your next appointment, please call your pharmacy.

## 2016-03-07 ENCOUNTER — Telehealth: Payer: Self-pay | Admitting: Interventional Cardiology

## 2016-03-07 ENCOUNTER — Other Ambulatory Visit: Payer: Self-pay | Admitting: Internal Medicine

## 2016-03-07 DIAGNOSIS — Z1231 Encounter for screening mammogram for malignant neoplasm of breast: Secondary | ICD-10-CM

## 2016-03-07 NOTE — Telephone Encounter (Signed)
New message   Pt verbalized that she is ready for Dr.varanasi to put her on a fluid pill she gained 7 pounds in 3 days She wants the pills to take as needed   Pt c/o swelling: STAT is pt has developed SOB within 24 hours  1. How long have you been experiencing swelling? 3 days  2. Where is the swelling located? Feet leg and ankles  3.  Are you currently taking a "fluid pill"? no  4.  Are you currently SOB? no  5.  Have you traveled recently? no

## 2016-03-07 NOTE — Telephone Encounter (Signed)
**Note De-Identified Eusebia Grulke Obfuscation** The pt states that she gained 7 lbs in a 3 day period. She does weigh herself daily and her weights are automatically sent to [facility]. [facility] alerted her PCP of her weight gain and then her PCP recommended that she call us.  The pt states that her weight is better today but she continues to have some SOB and swelling in her feet, ankles and legs.  She is requesting a diuretic that she can take as needed and she wants Dr Irish Lack to know that Dr Ron Parker had her taking Furosemide and Potassium as needed for SOB, edema and/or weight gain at one time.  Please advise.

## 2016-03-08 MED ORDER — POTASSIUM CHLORIDE CRYS ER 20 MEQ PO TBCR: 20.0000 meq | EXTENDED_RELEASE_TABLET | Freq: Every day | ORAL | 3 refills | Status: DC

## 2016-03-08 MED ORDER — FUROSEMIDE 40 MG PO TABS: 40.0000 mg | ORAL_TABLET | Freq: Every day | ORAL | 3 refills | Status: DC | PRN

## 2016-03-08 NOTE — Telephone Encounter (Signed)
OK to call in Furosemide 40 mg daily prn fluid overload.  WHen she takes Lasix, she should take potassium 20 mEq that day as well.   She can take this daily for the next three days.  BMet early next week.

## 2016-03-08 NOTE — Telephone Encounter (Signed)
I spoke with patient. I advised her of Dr Hassell Done recommendations and the medication changes. She voiced understanding, gave verbal read back, and agreed with plan. Rx's sent to pharmacy.

## 2016-03-14 ENCOUNTER — Encounter (INDEPENDENT_AMBULATORY_CARE_PROVIDER_SITE_OTHER): Payer: Self-pay | Admitting: Diagnostic Neuroimaging

## 2016-03-14 ENCOUNTER — Ambulatory Visit (INDEPENDENT_AMBULATORY_CARE_PROVIDER_SITE_OTHER): Payer: Medicare Other | Admitting: Diagnostic Neuroimaging

## 2016-03-14 DIAGNOSIS — R200 Anesthesia of skin: Secondary | ICD-10-CM

## 2016-03-14 DIAGNOSIS — Z0289 Encounter for other administrative examinations: Secondary | ICD-10-CM

## 2016-03-14 NOTE — Procedures (Signed)
   [facility]  NCS (NERVE CONDUCTION STUDY) WITH EMG (ELECTROMYOGRAPHY) REPORT   STUDY DATE: 03/14/16 PATIENT NAME: [name] DOB: [birth year] MRN: [id number]  ORDERING CLINICIAN: Johnnye Lana  TECHNOLOGIST: Laretta Alstrom  ELECTROMYOGRAPHER: Earlean Polka. Jomaira Darr, MD  CLINICAL INFORMATION: 71 year old female with intermittent numbness and weakness in hands x 1 month, and intermittent numbness in left foot when sitting on commode for a long time. History of diabetes, well controlled on insulin. Evaluate for neuropathy.    FINDINGS: NERVE CONDUCTION STUDY: Bilateral median motor response is prolonged distal latencies, normal amplitudes, normal conduction velocities and normal F-wave latencies. Bilateral ulnar motor responses and F wave latencies are normal. Left peroneal and left tibial motor responses are normal.  Bilateral median sensory responses have prolonged peak latencies and normal amplitudes. Bilateral ulnar sensory responses are normal. Left peroneal sensory response is normal.   NEEDLE ELECTROMYOGRAPHY: Needle examination of left upper extremity deltoid, biceps, triceps, flexor carpi radialis, first dorsal interosseous is normal. Patient is on anticoagulation and therefore cervical paraspinal muscles were deferred.   IMPRESSION:  Abnormal study demonstrate: 1. Mild bilateral median neuropathies at the wrist consistent with mild bilateral carpal tunnel syndrome. 2. No definite evidence of underlying widespread large fiber neuropathy.    INTERPRETING PHYSICIAN:  Penni Bombard, MD Certified in Neurology, Neurophysiology and Neuroimaging  [facility] [address], [place], [place] [address] [phone]

## 2016-03-28 ENCOUNTER — Ambulatory Visit: Payer: Medicare Other

## 2016-03-28 DIAGNOSIS — E1122 Type 2 diabetes mellitus with diabetic chronic kidney disease: Secondary | ICD-10-CM | POA: Diagnosis not present

## 2016-03-28 DIAGNOSIS — N183 Chronic kidney disease, stage 3 (moderate): Secondary | ICD-10-CM | POA: Diagnosis not present

## 2016-03-28 DIAGNOSIS — Z23 Encounter for immunization: Secondary | ICD-10-CM | POA: Diagnosis not present

## 2016-03-28 DIAGNOSIS — N08 Glomerular disorders in diseases classified elsewhere: Secondary | ICD-10-CM | POA: Diagnosis not present

## 2016-03-28 DIAGNOSIS — I1310 Hypertensive heart and chronic kidney disease without heart failure, with stage 1 through stage 4 chronic kidney disease, or unspecified chronic kidney disease: Secondary | ICD-10-CM | POA: Diagnosis not present

## 2016-04-10 DIAGNOSIS — Z23 Encounter for immunization: Secondary | ICD-10-CM | POA: Diagnosis not present

## 2016-04-10 DIAGNOSIS — H16223 Keratoconjunctivitis sicca, not specified as Sjogren's, bilateral: Secondary | ICD-10-CM | POA: Diagnosis not present

## 2016-04-10 DIAGNOSIS — H35022 Exudative retinopathy, left eye: Secondary | ICD-10-CM | POA: Diagnosis not present

## 2016-04-10 DIAGNOSIS — H40013 Open angle with borderline findings, low risk, bilateral: Secondary | ICD-10-CM | POA: Diagnosis not present

## 2016-04-18 ENCOUNTER — Ambulatory Visit: Payer: Medicare Other

## 2016-05-14 ENCOUNTER — Ambulatory Visit
Admission: RE | Admit: 2016-05-14 | Discharge: 2016-05-14 | Disposition: A | Discharge status: Home / Self Care | Payer: Medicare Other | Source: Ambulatory Visit | Attending: Internal Medicine | Admitting: Internal Medicine

## 2016-05-14 DIAGNOSIS — Z1231 Encounter for screening mammogram for malignant neoplasm of breast: Secondary | ICD-10-CM | POA: Diagnosis not present

## 2016-07-17 DIAGNOSIS — H43812 Vitreous degeneration, left eye: Secondary | ICD-10-CM | POA: Diagnosis not present

## 2016-07-17 DIAGNOSIS — H43811 Vitreous degeneration, right eye: Secondary | ICD-10-CM | POA: Diagnosis not present

## 2016-07-17 DIAGNOSIS — E119 Type 2 diabetes mellitus without complications: Secondary | ICD-10-CM | POA: Diagnosis not present

## 2016-07-17 DIAGNOSIS — I1310 Hypertensive heart and chronic kidney disease without heart failure, with stage 1 through stage 4 chronic kidney disease, or unspecified chronic kidney disease: Secondary | ICD-10-CM | POA: Diagnosis not present

## 2016-07-17 DIAGNOSIS — N08 Glomerular disorders in diseases classified elsewhere: Secondary | ICD-10-CM | POA: Diagnosis not present

## 2016-07-17 DIAGNOSIS — E1122 Type 2 diabetes mellitus with diabetic chronic kidney disease: Secondary | ICD-10-CM | POA: Diagnosis not present

## 2016-07-17 DIAGNOSIS — Z961 Presence of intraocular lens: Secondary | ICD-10-CM | POA: Diagnosis not present

## 2016-07-17 DIAGNOSIS — N183 Chronic kidney disease, stage 3 (moderate): Secondary | ICD-10-CM | POA: Diagnosis not present

## 2016-09-01 ENCOUNTER — Other Ambulatory Visit: Payer: Self-pay | Admitting: Interventional Cardiology

## 2016-09-10 ENCOUNTER — Other Ambulatory Visit: Payer: Self-pay | Admitting: Interventional Cardiology

## 2016-09-13 ENCOUNTER — Ambulatory Visit: Payer: Medicare Other | Admitting: Interventional Cardiology

## 2016-11-01 ENCOUNTER — Other Ambulatory Visit: Payer: Self-pay | Admitting: Interventional Cardiology

## 2016-11-05 ENCOUNTER — Telehealth: Payer: Self-pay | Admitting: *Deleted

## 2016-11-05 NOTE — Telephone Encounter (Signed)
Pt last saw Dr Irish Lack 02/19/16, cancelled f/u in 09/2016, rescheduled f/u for 11/07/16.  Pt's last labs were on 09/27/15 Creat 1.08, pt needs repeat labwork CBC/BMP drawn at [facility] on 11/07/16.  Note placed on appt in Epic.  Weight 118.8kg, age-11, based on specified criteria and previous labwork pt is on appropriate dosage of Eliquis BID, will await labwork from [facility] with Dr Irish Lack on 11/07/16 and then refill rx.

## 2016-11-07 ENCOUNTER — Ambulatory Visit: Payer: Medicare Other | Admitting: Interventional Cardiology

## 2016-11-07 ENCOUNTER — Other Ambulatory Visit: Payer: Self-pay | Admitting: *Deleted

## 2016-11-07 MED ORDER — FUROSEMIDE 40 MG PO TABS: 40.0000 mg | ORAL_TABLET | Freq: Every day | ORAL | 0 refills | Status: DC | PRN

## 2016-11-07 NOTE — Telephone Encounter (Signed)
Appointment rescheduled to 11/12/16 at 11:00am.

## 2016-11-12 ENCOUNTER — Encounter (INDEPENDENT_AMBULATORY_CARE_PROVIDER_SITE_OTHER): Payer: Self-pay

## 2016-11-12 ENCOUNTER — Ambulatory Visit (INDEPENDENT_AMBULATORY_CARE_PROVIDER_SITE_OTHER): Payer: Medicare Other | Admitting: Interventional Cardiology

## 2016-11-12 ENCOUNTER — Encounter: Payer: Self-pay | Admitting: Interventional Cardiology

## 2016-11-12 VITALS — BP 120/72 | HR 65 | Ht 67.0 in | Wt 257.8 lb

## 2016-11-12 DIAGNOSIS — I480 Paroxysmal atrial fibrillation: Secondary | ICD-10-CM

## 2016-11-12 DIAGNOSIS — I10 Essential (primary) hypertension: Secondary | ICD-10-CM

## 2016-11-12 DIAGNOSIS — E119 Type 2 diabetes mellitus without complications: Secondary | ICD-10-CM

## 2016-11-12 DIAGNOSIS — I428 Other cardiomyopathies: Secondary | ICD-10-CM | POA: Diagnosis not present

## 2016-11-12 NOTE — Patient Instructions (Signed)
Medication Instructions:  Your physician recommends that you continue on your current medications as directed. Please refer to the Current Medication list given to you today.   Labwork: You will have your labs drawn from Dr. Lynder Parents office tomorrow and have them send [place] a copy. We need a BMET and CBC  Testing/Procedures: None ordered  Follow-Up: Your physician wants you to follow-up in: 9 months with Dr. Irish Lack. You will receive a reminder letter in the mail two months in advance. If you don't receive a letter, please call our office to schedule the follow-up appointment.   Any Other Special Instructions Will Be Listed Below (If Applicable).     If you need a refill on your cardiac medications before your next appointment, please call your pharmacy.

## 2016-11-12 NOTE — Progress Notes (Signed)
Cardiology Office Note   Date:  11/12/2016   ID:  [name], DOB August 23, 1945, MRN [id number]  PCP:  [name], MD    No chief complaint on file. Nonischemic cardiomyopathy   Wt Readings from Last 3 Encounters:  11/12/16 257 lb 12.8 oz (116.9 kg)  02/19/16 262 lb (118.8 kg)  07/17/15 276 lb (125.2 kg)       History of Present Illness: [name] is a 71 y.o. female  Who was followed by Dr. Ron Parker, With a  Nonischemic cardiomyopathy, EF 25-30% in 2017.  She had a negative nuclear stress test in 2016.     She had PNA in 10/16.  SHe reports decreased balance.   EF rechecked in 2/17 and was still in the 25-30% range.  She has intermittent leg swelling.  She uses furosemide, as needed.  Denies : Chest pain. Dizziness. Leg edema. Nitroglycerin use. Orthopnea. Palpitations. Paroxysmal nocturnal dyspnea.  Syncope.   No bleeding problems.  Mild DOE.  She is eating less and decreasing portion sizes to lose weight.        Past Medical History:  Diagnosis Date  . Arthritis    "left knee" (03/12/2013)  . Atrial fibrillation ([place])    Cardioverted to NSR 08/22/2009  . Cardiomyopathy   . CHF (congestive heart failure) ([place])   . CKD (chronic kidney disease)   . Depression   . Dizziness   . DM2 (diabetes mellitus, type 2) ([place])    fasting 80-100 (03/12/2013)  . Drug therapy 1/11   coumadin  . Ejection fraction < 50%    20-25%, echo.06/2009, /  EF 30-35%, diffuse hypokinesis, echo, Oct 16, 2010  . GERD (gastroesophageal reflux disease)   . Gout    "left knee" (03/12/2013)  . History of bronchitis   . History of emphysema   . HLD (hyperlipidemia)   . Hyperkalemia   . Hypertension   . LFT elevation    06/2009  . Mitral regurgitation    mild - echo - 1/11 /  Mild, echo, May15, 2012  . Overweight(278.02)   . Pneumonia    "@ least twice" (03/12/2013)  . Thyroid dysfunction    thyromegally,diffuse,nodule LLL,06/2009  . Tobacco abuse   . Tricuspid  regurgitation    moderate - echo - 1/11    Past Surgical History:  Procedure Laterality Date  . BREAST BIOPSY Right 1980's  . BREAST LUMPECTOMY Right 1980's  . CATARACT EXTRACTION W/ INTRAOCULAR LENS  IMPLANT, BILATERAL Bilateral 2010-2012  . CHOLECYSTECTOMY  ~ 1978  . JOINT REPLACEMENT    . TONSILLECTOMY  1960's  . TOTAL KNEE ARTHROPLASTY Left 03/12/2013  . TOTAL KNEE ARTHROPLASTY Left 03/12/2013   Procedure: TOTAL KNEE ARTHROPLASTY;  Surgeon: Newt Minion, MD;  Location: [facility];  Service: Orthopedics;  Laterality: Left;  Left total knee arthroplasty  . TUBAL LIGATION    . VAGINAL HYSTERECTOMY  1987     Current Outpatient Prescriptions  Medication Sig Dispense Refill  . acetaminophen (TYLENOL) 500 MG tablet Take 1,000 mg by mouth 3 (three) times daily as needed for pain.    Marland Kitchen apixaban (ELIQUIS) 5 MG TABS tablet Take 1 tablet (5 mg total) by mouth 2 (two) times daily. 60 tablet   . carvedilol (COREG) 12.5 MG tablet TAKE 1 TABLET BY MOUTH 2 TIMES DAILY 180 tablet 2  . Cholecalciferol (VITAMIN D) 2000 UNITS CAPS Take 2,000 Units by mouth daily.    . colchicine 0.6 MG tablet Take 0.6  mg by mouth 2 (two) times daily as needed (GOUT).     . enalapril (VASOTEC) 10 MG tablet TAKE 1 TABLET (10 MG TOTAL) BY MOUTH 2 (TWO) TIMES DAILY. 60 tablet 4  . fluocinonide cream (LIDEX) 0.05 % Apply topically as needed (as directed for dry skin).   0  . furosemide (LASIX) 40 MG tablet Take 1 tablet (40 mg total) by mouth daily as needed. For fluid overload. 90 tablet 0  . LEVEMIR FLEXTOUCH 100 UNIT/ML Pen Inject 12 Units into the skin daily at 10 pm.     . Liraglutide (VICTOZA) 18 MG/3ML SOLN Inject 0.6 mg into the skin daily after supper. 4pm    . LIVALO 2 MG TABS Take 2 mg by mouth as directed. Patient take one (1) tablet Monday, Wednesday, Friday    . Multiple Vitamin (MULITIVITAMIN WITH MINERALS) TABS Take 1 tablet by mouth daily. Centrum Silver    . mupirocin ointment (BACTROBAN) 2 % Apply 1  application topically daily.     [name] VERIO test strip USE AS DIRECTED TO CHECK BLOOD SUGARS 1 TIME PER DAY DX: E11.22  11  . ranitidine (ZANTAC) 150 MG tablet Take 150 mg by mouth daily as needed for heartburn.     . vitamin C (ASCORBIC ACID) 500 MG tablet Take 500 mg by mouth daily.    . potassium chloride SA (KLOR-CON M20) 20 MEQ tablet Take 1 tablet (20 mEq total) by mouth daily. When taking furosemide (Lasix). 90 tablet 3   No current facility-administered medications for this visit.     Allergies:   Codeine and Warfarin and related    Social History:  The patient  reports that she has been smoking Cigarettes.  She has a 20.50 pack-year smoking history. She has never used smokeless tobacco. She reports that she does not drink alcohol or use drugs.   Family History:  The patient's family history includes Heart attack in her son; Hypertension in her sister and son; Prostate cancer in her father; Stroke in her maternal grandfather, mother, and paternal grandmother.    ROS:  Please see the history of present illness.   Otherwise, review of systems are positive for occasional sx of low blood sugar- some shaking.   All other systems are reviewed and negative.    PHYSICAL EXAM: VS:  BP 120/72   Pulse 65   Ht 5\' 7"  (1.702 m)   Wt 257 lb 12.8 oz (116.9 kg)   SpO2 99%   BMI 40.38 kg/m  , BMI Body mass index is 40.38 kg/m. GEN: Well nourished, well developed, in no acute distress  HEENT: normal  Neck: no JVD, carotid bruits, or masses Cardiac: RRR; no murmurs, rubs, or gallops,no edema  Respiratory:  clear to auscultation bilaterally, normal work of breathing GI: soft, nontender, nondistended, + BS MS: no deformity or atrophy  Skin: warm and dry, no rash Neuro:  Strength and sensation are intact Psych: euthymic mood, full affect   EKG:   The ekg ordered today demonstrates NSR, LBBB- no change from 2017   Recent Labs: No results found for requested labs within last 8760  hours.   Lipid Panel    Component Value Date/Time   CHOL  06/27/2009 0328    149        ATP III CLASSIFICATION:  <200     mg/dL   Desirable  200-239  mg/dL   Borderline High  >=240    mg/dL   High  TRIG 72 06/27/2009 0328   HDL 31 (L) 06/27/2009 0328   CHOLHDL 4.8 06/27/2009 0328   VLDL 14 06/27/2009 0328   LDLCALC (H) 06/27/2009 0328    104        Total Cholesterol/HDL:CHD Risk Coronary Heart Disease Risk Table                     Men   Women  1/2 Average Risk   3.4   3.3  Average Risk       5.0   4.4  2 X Average Risk   9.6   7.1  3 X Average Risk  23.4   11.0        Use the calculated Patient Ratio above and the CHD Risk Table to determine the patient's CHD Risk.        ATP III CLASSIFICATION (LDL):  <100     mg/dL   Optimal  100-129  mg/dL   Near or Above                    Optimal  130-159  mg/dL   Borderline  160-189  mg/dL   High  >190     mg/dL   Very High     Other studies Reviewed: Additional studies/ records that were reviewed today with results demonstrating: 2017 echo reviewed.   ASSESSMENT AND PLAN:  1. Nonischemic cardiomyopathy : Appears euvolemic.  Well compensated.  Increase activity to the target mentioned below. 2. Hypertension: Controlled. Continue current meds.  3. Paroxysmal atrial fibrillation: Maintaining NSR.  No bleeding issues.  Tolerating Eliquis.  Having labs tomorrow with PMD.  Please check CBC and BMet.  4. Diabetes: Followed by Dr. Baird Cancer. 5. Tobacco abuse: She needs to stop smoking.   Current medicines are reviewed at length with the patient today.  The patient concerns regarding her medicines were addressed.  The following changes have been made:  No change  Labs/ tests ordered today include:  No orders of the defined types were placed in this encounter.   Recommend 150 minutes/week of aerobic exercise Low fat, low carb, high fiber diet recommended  Disposition:   FU in 9 months   Signed, Larae Grooms,  MD  11/12/2016 11:50 AM    [facility] [place], [place], [place]  [address] Phone: [phone]; Fax: [phone]

## 2016-11-13 DIAGNOSIS — N08 Glomerular disorders in diseases classified elsewhere: Secondary | ICD-10-CM | POA: Diagnosis not present

## 2016-11-13 DIAGNOSIS — E1122 Type 2 diabetes mellitus with diabetic chronic kidney disease: Secondary | ICD-10-CM | POA: Diagnosis not present

## 2016-11-13 DIAGNOSIS — N183 Chronic kidney disease, stage 3 (moderate): Secondary | ICD-10-CM | POA: Diagnosis not present

## 2016-11-13 DIAGNOSIS — I1310 Hypertensive heart and chronic kidney disease without heart failure, with stage 1 through stage 4 chronic kidney disease, or unspecified chronic kidney disease: Secondary | ICD-10-CM | POA: Diagnosis not present

## 2016-11-13 DIAGNOSIS — E559 Vitamin D deficiency, unspecified: Secondary | ICD-10-CM | POA: Diagnosis not present

## 2016-11-13 DIAGNOSIS — Z720 Tobacco use: Secondary | ICD-10-CM | POA: Diagnosis not present

## 2016-11-13 DIAGNOSIS — Z1389 Encounter for screening for other disorder: Secondary | ICD-10-CM | POA: Diagnosis not present

## 2016-11-13 DIAGNOSIS — Z0000 Encounter for general adult medical examination without abnormal findings: Secondary | ICD-10-CM | POA: Diagnosis not present

## 2016-11-14 MED ORDER — APIXABAN 5 MG PO TABS: 5.0000 mg | ORAL_TABLET | Freq: Two times a day (BID) | ORAL | 3 refills | Status: DC

## 2016-11-14 NOTE — Telephone Encounter (Signed)
Received labs that were done at [facility] yesterday. SCr 1.20. Age 71 yrs, wt. 116.9 Kg. Eliquis 5mg  BID refilled. Pt saw Dr Irish Lack on 11/12/16.

## 2016-11-28 ENCOUNTER — Other Ambulatory Visit: Payer: Self-pay | Admitting: *Deleted

## 2016-11-28 MED ORDER — ENALAPRIL MALEATE 10 MG PO TABS: ORAL_TABLET | ORAL | 3 refills | Status: DC

## 2017-02-26 DIAGNOSIS — Z23 Encounter for immunization: Secondary | ICD-10-CM | POA: Diagnosis not present

## 2017-02-26 DIAGNOSIS — I130 Hypertensive heart and chronic kidney disease with heart failure and stage 1 through stage 4 chronic kidney disease, or unspecified chronic kidney disease: Secondary | ICD-10-CM | POA: Diagnosis not present

## 2017-02-26 DIAGNOSIS — R0602 Shortness of breath: Secondary | ICD-10-CM | POA: Diagnosis not present

## 2017-02-26 DIAGNOSIS — R2689 Other abnormalities of gait and mobility: Secondary | ICD-10-CM | POA: Diagnosis not present

## 2017-02-26 DIAGNOSIS — N08 Glomerular disorders in diseases classified elsewhere: Secondary | ICD-10-CM | POA: Diagnosis not present

## 2017-02-26 DIAGNOSIS — E1122 Type 2 diabetes mellitus with diabetic chronic kidney disease: Secondary | ICD-10-CM | POA: Diagnosis not present

## 2017-02-26 DIAGNOSIS — N183 Chronic kidney disease, stage 3 (moderate): Secondary | ICD-10-CM | POA: Diagnosis not present

## 2017-03-13 ENCOUNTER — Other Ambulatory Visit: Payer: Self-pay | Admitting: Interventional Cardiology

## 2017-04-21 ENCOUNTER — Other Ambulatory Visit: Payer: Self-pay

## 2017-04-21 DIAGNOSIS — R5381 Other malaise: Secondary | ICD-10-CM

## 2017-04-22 DIAGNOSIS — N644 Mastodynia: Secondary | ICD-10-CM | POA: Diagnosis not present

## 2017-04-22 DIAGNOSIS — S2001XA Contusion of right breast, initial encounter: Secondary | ICD-10-CM | POA: Diagnosis not present

## 2017-05-01 DIAGNOSIS — E1122 Type 2 diabetes mellitus with diabetic chronic kidney disease: Secondary | ICD-10-CM | POA: Diagnosis not present

## 2017-05-01 DIAGNOSIS — I429 Cardiomyopathy, unspecified: Secondary | ICD-10-CM | POA: Diagnosis not present

## 2017-05-01 DIAGNOSIS — N08 Glomerular disorders in diseases classified elsewhere: Secondary | ICD-10-CM | POA: Diagnosis not present

## 2017-05-01 DIAGNOSIS — N183 Chronic kidney disease, stage 3 (moderate): Secondary | ICD-10-CM | POA: Diagnosis not present

## 2017-05-21 ENCOUNTER — Other Ambulatory Visit: Payer: Self-pay | Admitting: Interventional Cardiology

## 2017-06-10 ENCOUNTER — Other Ambulatory Visit: Payer: Self-pay | Admitting: Internal Medicine

## 2017-06-10 DIAGNOSIS — Z1231 Encounter for screening mammogram for malignant neoplasm of breast: Secondary | ICD-10-CM

## 2017-07-01 ENCOUNTER — Ambulatory Visit
Admission: RE | Admit: 2017-07-01 | Discharge: 2017-07-01 | Disposition: A | Discharge status: Home / Self Care | Payer: Medicare Other | Source: Ambulatory Visit | Attending: Internal Medicine | Admitting: Internal Medicine

## 2017-07-01 DIAGNOSIS — Z1231 Encounter for screening mammogram for malignant neoplasm of breast: Secondary | ICD-10-CM | POA: Diagnosis not present

## 2017-07-02 ENCOUNTER — Other Ambulatory Visit: Payer: Self-pay | Admitting: Internal Medicine

## 2017-07-02 DIAGNOSIS — R928 Other abnormal and inconclusive findings on diagnostic imaging of breast: Secondary | ICD-10-CM

## 2017-07-08 ENCOUNTER — Ambulatory Visit
Admission: RE | Admit: 2017-07-08 | Discharge: 2017-07-08 | Disposition: A | Discharge status: Home / Self Care | Payer: Medicare Other | Source: Ambulatory Visit | Attending: Internal Medicine | Admitting: Internal Medicine

## 2017-07-08 ENCOUNTER — Other Ambulatory Visit: Payer: Self-pay | Admitting: Internal Medicine

## 2017-07-08 DIAGNOSIS — N6489 Other specified disorders of breast: Secondary | ICD-10-CM | POA: Diagnosis not present

## 2017-07-08 DIAGNOSIS — N6310 Unspecified lump in the right breast, unspecified quadrant: Secondary | ICD-10-CM

## 2017-07-08 DIAGNOSIS — R928 Other abnormal and inconclusive findings on diagnostic imaging of breast: Secondary | ICD-10-CM | POA: Diagnosis not present

## 2017-08-05 ENCOUNTER — Other Ambulatory Visit: Payer: Medicare Other

## 2017-08-07 DIAGNOSIS — N183 Chronic kidney disease, stage 3 (moderate): Secondary | ICD-10-CM | POA: Diagnosis not present

## 2017-08-07 DIAGNOSIS — N08 Glomerular disorders in diseases classified elsewhere: Secondary | ICD-10-CM | POA: Diagnosis not present

## 2017-08-07 DIAGNOSIS — I429 Cardiomyopathy, unspecified: Secondary | ICD-10-CM | POA: Diagnosis not present

## 2017-08-07 DIAGNOSIS — E1122 Type 2 diabetes mellitus with diabetic chronic kidney disease: Secondary | ICD-10-CM | POA: Diagnosis not present

## 2017-08-12 ENCOUNTER — Ambulatory Visit
Admission: RE | Admit: 2017-08-12 | Discharge: 2017-08-12 | Disposition: A | Discharge status: Home / Self Care | Payer: Medicare Other | Source: Ambulatory Visit | Attending: Internal Medicine | Admitting: Internal Medicine

## 2017-08-12 ENCOUNTER — Other Ambulatory Visit: Payer: Self-pay | Admitting: Internal Medicine

## 2017-08-12 DIAGNOSIS — N6489 Other specified disorders of breast: Secondary | ICD-10-CM | POA: Diagnosis not present

## 2017-08-12 DIAGNOSIS — N6310 Unspecified lump in the right breast, unspecified quadrant: Secondary | ICD-10-CM

## 2017-09-02 ENCOUNTER — Other Ambulatory Visit: Payer: Medicare Other

## 2017-09-04 ENCOUNTER — Other Ambulatory Visit: Payer: Medicare Other

## 2017-09-08 ENCOUNTER — Ambulatory Visit
Admission: RE | Admit: 2017-09-08 | Discharge: 2017-09-08 | Disposition: A | Discharge status: Home / Self Care | Payer: Medicare Other | Source: Ambulatory Visit | Attending: Internal Medicine | Admitting: Internal Medicine

## 2017-09-08 DIAGNOSIS — N6489 Other specified disorders of breast: Secondary | ICD-10-CM | POA: Diagnosis not present

## 2017-09-08 DIAGNOSIS — N6310 Unspecified lump in the right breast, unspecified quadrant: Secondary | ICD-10-CM

## 2017-11-10 ENCOUNTER — Other Ambulatory Visit: Payer: Self-pay | Admitting: Interventional Cardiology

## 2017-12-02 ENCOUNTER — Other Ambulatory Visit: Payer: Self-pay | Admitting: Interventional Cardiology

## 2017-12-11 ENCOUNTER — Telehealth: Payer: Self-pay | Admitting: Interventional Cardiology

## 2017-12-11 NOTE — Telephone Encounter (Signed)
New Message:       Pt c/o swelling: STAT is pt has developed SOB within 24 hours  1) How much weight have you gained and in what time span? 6 lbs in 3 days  2) If swelling, where is the swelling located? Legs  3) Are you currently taking a fluid pill? Yes  4) Are you currently SOB?   5) Do you have a log of your daily weights (if so, list)? No  6) Have you gained 3 pounds in a day or 5 pounds in a week? 6 lbs  7) Have you traveled recently? No   [name] from [facility] is calling on behalf of the pt who was complaining of swelling on yesterday. She states the pt took a fluid pill and it has went down today. She states the pt was stating she may take two fluid pills on today. She stated to the pt she may want to wait before she takes an extra pill and speak with [place] first. Pt states she is feeling fine and thinks her swelling may be related to some food she may have at too late at night. If you would like to call [name] back her number is [phone] ext [phone]

## 2017-12-11 NOTE — Telephone Encounter (Signed)
Agree. I am covering Dr. Hassell Done inbox.   Lauree Chandler

## 2017-12-11 NOTE — Telephone Encounter (Signed)
Spoke with pt who states she had a salty meal a couple of days ago. She is up 6lbs today after taking 40mg  of lasix on 7/11. She denies SOB but has BLE edema. I have advised pt to double her lasix today along with taking her K-dur supplement. She is to weigh herself tomorrow morning. If she is still over her base weight, she should take a normal dose of her lasix with K supplement on Friday. She will continue to weigh herself and take 40mg  lasix qd until she is at her base weight. Pt will call if office if she has any additional issues or becomes more symptomatic.   Pt states she will call to schedule her yearly visit in the near future.

## 2017-12-18 ENCOUNTER — Other Ambulatory Visit: Payer: Self-pay | Admitting: Interventional Cardiology

## 2017-12-27 ENCOUNTER — Other Ambulatory Visit: Payer: Self-pay | Admitting: Interventional Cardiology

## 2018-01-23 ENCOUNTER — Other Ambulatory Visit: Payer: Self-pay | Admitting: Interventional Cardiology

## 2018-01-29 ENCOUNTER — Other Ambulatory Visit: Payer: Self-pay | Admitting: Interventional Cardiology

## 2018-02-06 ENCOUNTER — Other Ambulatory Visit: Payer: Self-pay | Admitting: *Deleted

## 2018-02-06 MED ORDER — ENALAPRIL MALEATE 10 MG PO TABS: 10.0000 mg | ORAL_TABLET | Freq: Two times a day (BID) | ORAL | 1 refills | Status: DC

## 2018-03-07 ENCOUNTER — Other Ambulatory Visit: Payer: Self-pay | Admitting: Interventional Cardiology

## 2018-03-09 NOTE — Telephone Encounter (Signed)
Outpatient Medication Detail    Disp Refills Start End   enalapril (VASOTEC) 10 MG tablet 60 tablet 1 02/06/2018    Sig - Route: Take 1 tablet (10 mg total) by mouth 2 (two) times daily. Please call [phone] to schedule appointment for additional refills thanks. - Oral   Sent to pharmacy as: enalapril (VASOTEC) 10 MG tablet   E-Prescribing Status: Receipt confirmed by pharmacy (02/06/2018 5:20 PM EDT)   Pharmacy   [id number] - [place], [address]

## 2018-03-11 ENCOUNTER — Other Ambulatory Visit: Payer: Self-pay | Admitting: Interventional Cardiology

## 2018-03-11 ENCOUNTER — Telehealth: Payer: Self-pay

## 2018-03-11 NOTE — Telephone Encounter (Signed)
The patient was told that Dr. Baird Cancer said to decrease her insulin by 3 units because the pt called and said that her sugars has been running down in the 70s that it was 97 yesterday and had dropped back down to 73 today and that is what it usually runs.

## 2018-04-01 ENCOUNTER — Ambulatory Visit: Payer: Medicare Other | Admitting: Interventional Cardiology

## 2018-04-05 ENCOUNTER — Other Ambulatory Visit: Payer: Self-pay | Admitting: Interventional Cardiology

## 2018-04-21 ENCOUNTER — Ambulatory Visit: Payer: Medicare Other | Admitting: Interventional Cardiology

## 2018-04-21 ENCOUNTER — Encounter: Payer: Self-pay | Admitting: Interventional Cardiology

## 2018-04-21 VITALS — BP 110/80 | HR 95 | Ht 67.0 in | Wt 236.1 lb

## 2018-04-21 DIAGNOSIS — I10 Essential (primary) hypertension: Secondary | ICD-10-CM

## 2018-04-21 DIAGNOSIS — I480 Paroxysmal atrial fibrillation: Secondary | ICD-10-CM | POA: Diagnosis not present

## 2018-04-21 DIAGNOSIS — E119 Type 2 diabetes mellitus without complications: Secondary | ICD-10-CM | POA: Diagnosis not present

## 2018-04-21 DIAGNOSIS — I428 Other cardiomyopathies: Secondary | ICD-10-CM

## 2018-04-21 MED ORDER — ENALAPRIL MALEATE 10 MG PO TABS: 5.0000 mg | ORAL_TABLET | Freq: Every day | ORAL | 0 refills | Status: DC

## 2018-04-21 MED ORDER — CARVEDILOL 25 MG PO TABS: 25.0000 mg | ORAL_TABLET | Freq: Two times a day (BID) | ORAL | 3 refills | Status: DC

## 2018-04-21 MED ORDER — FUROSEMIDE 40 MG PO TABS: 40.0000 mg | ORAL_TABLET | Freq: Every day | ORAL | 3 refills | Status: DC | PRN

## 2018-04-21 NOTE — Patient Instructions (Signed)
Medication Instructions:  Your physician has recommended you make the following change in your medication:   INCREASE: carvedilol (coreg) to 25 mg twice a day   If you need a refill on your cardiac medications before your next appointment, please call your pharmacy.   Lab work: TODAY: CBC, BMET, MG  If you have labs (blood work) drawn today and your tests are completely normal, you will receive your results only by: Marland Kitchen MyChart Message (if you have MyChart) OR . A paper copy in the mail If you have any lab test that is abnormal or we need to change your treatment, we will call you to review the results.  Testing/Procedures: None ordered  Follow-Up: Your physician recommends that you schedule a follow-up appointment in: 2 weeks in the Hypertension Clinic   At [facility], you and your health needs are our priority.  As part of our continuing mission to provide you with exceptional heart care, we have created designated Provider Care Teams.  These Care Teams include your primary Cardiologist (physician) and Advanced Practice Providers (APPs -  Physician Assistants and Nurse Practitioners) who all work together to provide you with the care you need, when you need it. . You will need a follow up appointment in 9 months.  Please call our office 2 months in advance to schedule this appointment.  You may see Casandra Doffing, MD or one of the following Advanced Practice Providers on your designated Care Team:   . Lyda Jester, PA-C . Dayna Dunn, PA-C . Ermalinda Barrios, PA-C  Any Other Special Instructions Will Be Listed Below (If Applicable).

## 2018-04-21 NOTE — Progress Notes (Signed)
Cardiology Office Note   Date:  04/21/2018   ID:  [name], DOB 1946/01/15, MRN [id number]  PCP:  [name], MD    No chief complaint on file.  NICM  Wt Readings from Last 3 Encounters:  04/21/18 236 lb 1.9 oz (107.1 kg)  11/12/16 257 lb 12.8 oz (116.9 kg)  02/19/16 262 lb (118.8 kg)       History of Present Illness: [name] is a 72 y.o. female  Who was followed by Dr. Ron Parker, With a Nonischemic cardiomyopathy, EF 25-30% in 2017. She had a negative nuclear stress test in 2016.    She had PNA in 10/16.  SHe reports decreased balance.   EF rechecked in 2/17 and was still in the 25-30% range.  She has intermittent leg swelling.  She uses furosemide, as needed.  Last week, she took some extra fluid medicine for swelling.  Otherwise, she feels she fatigued since the last visit.    Her weight at home has been stable.    Denies : Chest pain. Dizziness.  Nitroglycerin use. Orthopnea. Palpitations. Paroxysmal nocturnal dyspnea. Syncope.   Not exercising.  Walk to the mailbox is the only exercise in addition to walking around the house.  Legs are not strong enough for shopping.    Enalapril was decreased to 5 mg daily.   SHe continues to lose weight over the past 3 years.   Daughter-in-law was in a severe car accident and is currently in the hospital.  This is a source of stress.  Past Medical History:  Diagnosis Date  . Arthritis    "left knee" (03/12/2013)  . Atrial fibrillation ([place])    Cardioverted to NSR 08/22/2009  . Cardiomyopathy   . CHF (congestive heart failure) ([place])   . CKD (chronic kidney disease)   . Depression   . Dizziness   . DM2 (diabetes mellitus, type 2) ([place])    fasting 80-100 (03/12/2013)  . Drug therapy 1/11   coumadin  . Ejection fraction < 50%    20-25%, echo.06/2009, /  EF 30-35%, diffuse hypokinesis, echo, Oct 16, 2010  . GERD (gastroesophageal reflux disease)   . Gout    "left knee" (03/12/2013)  .  History of bronchitis   . History of emphysema   . HLD (hyperlipidemia)   . Hyperkalemia   . Hypertension   . LFT elevation    06/2009  . Mitral regurgitation    mild - echo - 1/11 /  Mild, echo, May15, 2012  . Overweight(278.02)   . Pneumonia    "@ least twice" (03/12/2013)  . Thyroid dysfunction    thyromegally,diffuse,nodule LLL,06/2009  . Tobacco abuse   . Tricuspid regurgitation    moderate - echo - 1/11    Past Surgical History:  Procedure Laterality Date  . BREAST BIOPSY Right 1980's  . BREAST LUMPECTOMY Right 1980's  . CATARACT EXTRACTION W/ INTRAOCULAR LENS  IMPLANT, BILATERAL Bilateral 2010-2012  . CHOLECYSTECTOMY  ~ 1978  . JOINT REPLACEMENT    . TONSILLECTOMY  1960's  . TOTAL KNEE ARTHROPLASTY Left 03/12/2013  . TOTAL KNEE ARTHROPLASTY Left 03/12/2013   Procedure: TOTAL KNEE ARTHROPLASTY;  Surgeon: Newt Minion, MD;  Location: [facility];  Service: Orthopedics;  Laterality: Left;  Left total knee arthroplasty  . TUBAL LIGATION    . VAGINAL HYSTERECTOMY  1987     Current Outpatient Medications  Medication Sig Dispense Refill  . acetaminophen (TYLENOL) 500 MG tablet Take 1,000 mg by mouth  3 (three) times daily as needed for pain.    Marland Kitchen apixaban (ELIQUIS) 5 MG TABS tablet Take 1 tablet (5 mg total) by mouth 2 (two) times daily. 180 tablet 3  . carvedilol (COREG) 12.5 MG tablet Take 1 tablet (12.5 mg total) by mouth 2 (two) times daily with a meal. Please keep upcoming appt in November. Final attempt 60 tablet 0  . Cholecalciferol (VITAMIN D) 2000 UNITS CAPS Take 2,000 Units by mouth daily.    . colchicine 0.6 MG tablet Take 0.6 mg by mouth 2 (two) times daily as needed (GOUT).     . enalapril (VASOTEC) 10 MG tablet Take 1 tablet (10 mg total) by mouth 2 (two) times daily. Please keep upcoming appt in November before anymore refills. Final attempt 60 tablet 0  . fluocinonide cream (LIDEX) 0.05 % Apply topically as needed (as directed for dry skin).   0  . furosemide  (LASIX) 40 MG tablet TAKE 1 TABLET (40 MG TOTAL) BY MOUTH DAILY AS NEEDED. FOR FLUID OVERLOAD. 90 tablet 2  . KLOR-CON M20 20 MEQ tablet TAKE 1 TABLET (20 MEQ TOTAL) BY MOUTH DAILY. WHEN TAKING FUROSEMIDE (LASIX). 90 tablet 2  . LEVEMIR FLEXTOUCH 100 UNIT/ML Pen Inject 3 Units into the skin daily at 10 pm.     . Liraglutide (VICTOZA) 18 MG/3ML SOLN Inject 0.6 mg into the skin daily after supper. 4pm    . LIVALO 2 MG TABS Take 2 mg by mouth as directed. Patient take one (1) tablet Monday, Wednesday, Friday    . Magnesium 250 MG TABS TAKE 1 TABLET BY MOUTH EVERY DAY WITH EVENING MEAL  0  . Multiple Vitamin (MULITIVITAMIN WITH MINERALS) TABS Take 1 tablet by mouth daily. Centrum Silver    . mupirocin ointment (BACTROBAN) 2 % Apply 1 application topically daily.     [name] VERIO test strip USE AS DIRECTED TO CHECK BLOOD SUGARS 1 TIME PER DAY DX: E11.22  11  . ranitidine (ZANTAC) 150 MG tablet Take 150 mg by mouth daily as needed for heartburn.     . vitamin C (ASCORBIC ACID) 500 MG tablet Take 500 mg by mouth daily.     No current facility-administered medications for this visit.     Allergies:   Codeine and Warfarin and related    Social History:  The patient  reports that she has been smoking cigarettes. She has a 20.50 pack-year smoking history. She has never used smokeless tobacco. She reports that she does not drink alcohol or use drugs.   Family History:  The patient's family history includes Colon cancer in her unknown relative; Heart attack in her son; Heart disease in her unknown relative; Hypertension in her sister and son; Prostate cancer in her father; Stroke in her maternal grandfather, mother, and paternal grandmother.    ROS:  Please see the history of present illness.   Otherwise, review of systems are positive for stress with her daughter in law's injuries.   All other systems are reviewed and negative.    PHYSICAL EXAM: VS:  BP 110/80   Pulse 95   Ht 5\' 7"  (1.702 m)    Wt 236 lb 1.9 oz (107.1 kg)   BMI 36.98 kg/m  , BMI Body mass index is 36.98 kg/m. GEN: Well nourished, well developed, in no acute distress  HEENT: normal  Neck: no JVD, carotid bruits, or masses Cardiac: RRR; no murmurs, rubs, or gallops,no edema  Respiratory:  clear to auscultation bilaterally, normal work  of breathing GI: soft, nontender, nondistended, + BS MS: no deformity or atrophy  Skin: warm and dry, no rash Neuro:  Strength and sensation are intact Psych: euthymic mood, full affect   EKG:   The ekg ordered today demonstrates possible atrial tach- unusual P wave axis, PVCs, nonspecific ST changes   Recent Labs: No results found for requested labs within last 8760 hours.   Lipid Panel    Component Value Date/Time   CHOL  06/27/2009 0328    149        ATP III CLASSIFICATION:  <200     mg/dL   Desirable  200-239  mg/dL   Borderline High  >=240    mg/dL   High          TRIG 72 06/27/2009 0328   HDL 31 (L) 06/27/2009 0328   CHOLHDL 4.8 06/27/2009 0328   VLDL 14 06/27/2009 0328   LDLCALC (H) 06/27/2009 0328    104        Total Cholesterol/HDL:CHD Risk Coronary Heart Disease Risk Table                     Men   Women  1/2 Average Risk   3.4   3.3  Average Risk       5.0   4.4  2 X Average Risk   9.6   7.1  3 X Average Risk  23.4   11.0        Use the calculated Patient Ratio above and the CHD Risk Table to determine the patient's CHD Risk.        ATP III CLASSIFICATION (LDL):  <100     mg/dL   Optimal  100-129  mg/dL   Near or Above                    Optimal  130-159  mg/dL   Borderline  160-189  mg/dL   High  >190     mg/dL   Very High     Other studies Reviewed: Additional studies/ records that were reviewed today with results demonstrating: .   ASSESSMENT AND PLAN:  1. NICM: Appears euvolemic.  Heart rate is increased.  Will increase carvedilol to 25 mg twice daily.  May be an atrial tachycardia based on the unusual P wave axis.  Would like to  see her heart rate closer to 60 given her prior nonischemic cardiomyopathy. 2. Chronic renal insufficiency: Stage III.  ACE inhibitor has been decreased to 5 mg once a day.  Check BMet.  3. Recheck labs.  Also on Eliquis for atrial fibrillation.  Check CBC.   Current medicines are reviewed at length with the patient today.  The patient concerns regarding her medicines were addressed.  The following changes have been made:  Increase carvedilol.  Refill meds.  Labs/ tests ordered today include:  No orders of the defined types were placed in this encounter.   Recommend 150 minutes/week of aerobic exercise Low fat, low carb, high fiber diet recommended  Disposition:   FU in 3 weeks with PharmD... 9 months with me.   Signed, Larae Grooms, MD  04/21/2018 3:21 PM    [facility] [address], [place], [place]  [address] Phone: [phone]; Fax: [phone]

## 2018-04-22 ENCOUNTER — Other Ambulatory Visit: Payer: Self-pay | Admitting: Nurse Practitioner

## 2018-04-22 LAB — CBC
Hematocrit: 43.7 % (ref 34.0–46.6)
Hemoglobin: 13.8 g/dL (ref 11.1–15.9)
MCH: 26.3 pg — ABNORMAL LOW (ref 26.6–33.0)
MCHC: 31.6 g/dL (ref 31.5–35.7)
MCV: 83 fL (ref 79–97)
Platelets: 228 10*3/uL (ref 150–450)
RBC: 5.25 x10E6/uL (ref 3.77–5.28)
RDW: 14 % (ref 12.3–15.4)
WBC: 8.2 10*3/uL (ref 3.4–10.8)

## 2018-04-22 LAB — BASIC METABOLIC PANEL
BUN/Creatinine Ratio: 21 (ref 12–28)
BUN: 33 mg/dL — ABNORMAL HIGH (ref 8–27)
CO2: 21 mmol/L (ref 20–29)
Calcium: 9.9 mg/dL (ref 8.7–10.3)
Chloride: 102 mmol/L (ref 96–106)
Creatinine, Ser: 1.56 mg/dL — ABNORMAL HIGH (ref 0.57–1.00)
GFR calc Af Amer: 38 mL/min/{1.73_m2} — ABNORMAL LOW (ref >59)
GFR calc non Af Amer: 33 mL/min/{1.73_m2} — ABNORMAL LOW (ref >59)
Glucose: 94 mg/dL (ref 65–99)
Potassium: 5.3 mmol/L — ABNORMAL HIGH (ref 3.5–5.2)
Sodium: 139 mmol/L (ref 134–144)

## 2018-04-22 LAB — MAGNESIUM: Magnesium: 2.4 mg/dL — ABNORMAL HIGH (ref 1.6–2.3)

## 2018-04-27 ENCOUNTER — Other Ambulatory Visit: Payer: Self-pay | Admitting: Internal Medicine

## 2018-04-29 ENCOUNTER — Ambulatory Visit (INDEPENDENT_AMBULATORY_CARE_PROVIDER_SITE_OTHER): Payer: Medicare Other | Admitting: Internal Medicine

## 2018-04-29 VITALS — BP 132/78 | HR 65 | Temp 97.7°F | Ht 67.0 in | Wt 237.4 lb

## 2018-04-29 DIAGNOSIS — N183 Chronic kidney disease, stage 3 unspecified: Secondary | ICD-10-CM

## 2018-04-29 DIAGNOSIS — Z23 Encounter for immunization: Secondary | ICD-10-CM

## 2018-04-29 DIAGNOSIS — E1122 Type 2 diabetes mellitus with diabetic chronic kidney disease: Secondary | ICD-10-CM | POA: Diagnosis not present

## 2018-04-29 DIAGNOSIS — Z794 Long term (current) use of insulin: Secondary | ICD-10-CM

## 2018-04-29 DIAGNOSIS — Z6837 Body mass index (BMI) 37.0-37.9, adult: Secondary | ICD-10-CM

## 2018-04-29 DIAGNOSIS — I428 Other cardiomyopathies: Secondary | ICD-10-CM

## 2018-04-29 DIAGNOSIS — E66812 Obesity, class 2: Secondary | ICD-10-CM

## 2018-04-29 DIAGNOSIS — I130 Hypertensive heart and chronic kidney disease with heart failure and stage 1 through stage 4 chronic kidney disease, or unspecified chronic kidney disease: Secondary | ICD-10-CM | POA: Diagnosis not present

## 2018-04-30 ENCOUNTER — Other Ambulatory Visit: Payer: Self-pay | Admitting: Interventional Cardiology

## 2018-04-30 LAB — CMP14+EGFR
ALT: 16 IU/L (ref 0–32)
AST: 19 IU/L (ref 0–40)
Albumin/Globulin Ratio: 1.5 (ref 1.2–2.2)
Albumin: 4.2 g/dL (ref 3.5–4.8)
Alkaline Phosphatase: 93 IU/L (ref 39–117)
BUN/Creatinine Ratio: 27 (ref 12–28)
BUN: 38 mg/dL — ABNORMAL HIGH (ref 8–27)
Bilirubin Total: 0.2 mg/dL (ref 0.0–1.2)
CO2: 22 mmol/L (ref 20–29)
Calcium: 9.6 mg/dL (ref 8.7–10.3)
Chloride: 101 mmol/L (ref 96–106)
Creatinine, Ser: 1.42 mg/dL — ABNORMAL HIGH (ref 0.57–1.00)
GFR calc Af Amer: 42 mL/min/{1.73_m2} — ABNORMAL LOW (ref >59)
GFR calc non Af Amer: 37 mL/min/{1.73_m2} — ABNORMAL LOW (ref >59)
Globulin, Total: 2.8 g/dL (ref 1.5–4.5)
Glucose: 76 mg/dL (ref 65–99)
Potassium: 4.3 mmol/L (ref 3.5–5.2)
Sodium: 138 mmol/L (ref 134–144)
Total Protein: 7 g/dL (ref 6.0–8.5)

## 2018-04-30 LAB — HEMOGLOBIN A1C
Est. average glucose Bld gHb Est-mCnc: 123 mg/dL
Hgb A1c MFr Bld: 5.9 % — ABNORMAL HIGH (ref 4.8–5.6)

## 2018-05-01 ENCOUNTER — Encounter: Payer: Self-pay | Admitting: Internal Medicine

## 2018-05-01 NOTE — Progress Notes (Signed)
Subjective:     Patient ID: [name] , female    DOB: 11-May-1946 , 72 y.o.   MRN: [id number]   Chief Complaint  Patient presents with  . Diabetes  . Hypertension    HPI  Diabetes  She presents for her follow-up diabetic visit. She has type 2 diabetes mellitus. Her disease course has been improving. There are no hypoglycemic associated symptoms. There are no diabetic associated symptoms. There are no hypoglycemic complications. Diabetic complications include nephropathy. Risk factors for coronary artery disease include diabetes mellitus, dyslipidemia, hypertension, post-menopausal, sedentary lifestyle and obesity. She is compliant with treatment most of the time. She is following a generally healthy diet. Her breakfast blood glucose is taken between 8-9 am. Her breakfast blood glucose range is generally 70-90 mg/dl. Eye exam is current.  Hypertension  This is a chronic problem. The current episode started more than 1 year ago. The problem has been gradually improving since onset. The problem is controlled.   Reports compliance with meds.   Past Medical History:  Diagnosis Date  . Arthritis    "left knee" (03/12/2013)  . Atrial fibrillation ([place])    Cardioverted to NSR 08/22/2009  . Cardiomyopathy   . CHF (congestive heart failure) ([place])   . CKD (chronic kidney disease)   . Depression   . Dizziness   . DM2 (diabetes mellitus, type 2) ([place])    fasting 80-100 (03/12/2013)  . Drug therapy 1/11   coumadin  . Ejection fraction < 50%    20-25%, echo.06/2009, /  EF 30-35%, diffuse hypokinesis, echo, Oct 16, 2010  . GERD (gastroesophageal reflux disease)   . Gout    "left knee" (03/12/2013)  . History of bronchitis   . History of emphysema   . HLD (hyperlipidemia)   . Hyperkalemia   . Hypertension   . LFT elevation    06/2009  . Mitral regurgitation    mild - echo - 1/11 /  Mild, echo, May15, 2012  . Overweight(278.02)   . Pneumonia    "@ least twice" (03/12/2013)  .  Thyroid dysfunction    thyromegally,diffuse,nodule LLL,06/2009  . Tobacco abuse   . Tricuspid regurgitation    moderate - echo - 1/11     Family History  Problem Relation Age of Onset  . Stroke Mother   . Prostate cancer Father   . Colon cancer Unknown   . Heart disease Unknown   . Heart attack Son   . Hypertension Sister   . Hypertension Son        [id number]  . Stroke Maternal Grandfather   . Stroke Paternal Grandmother      Current Outpatient Medications:  .  acetaminophen (TYLENOL) 500 MG tablet, Take 1,000 mg by mouth 3 (three) times daily as needed for pain., Disp: , Rfl:  .  carvedilol (COREG) 25 MG tablet, Take 1 tablet (25 mg total) by mouth 2 (two) times daily with a meal., Disp: 180 tablet, Rfl: 3 .  Cholecalciferol (VITAMIN D) 2000 UNITS CAPS, Take 2,000 Units by mouth daily., Disp: , Rfl:  .  colchicine 0.6 MG tablet, Take 0.6 mg by mouth 2 (two) times daily as needed (GOUT). , Disp: , Rfl:  .  ELIQUIS 5 MG TABS tablet, TAKE 1 TABLET BY ORAL ROUTE 2 TIMES EVERY DAY, Disp: 180 tablet, Rfl: 1 .  enalapril (VASOTEC) 10 MG tablet, Take 0.5 tablets (5 mg total) by mouth daily., Disp: 90 tablet, Rfl: 0 .  fluocinonide cream (LIDEX) 0.05 %,  Apply topically as needed (as directed for dry skin). , Disp: , Rfl: 0 .  furosemide (LASIX) 40 MG tablet, Take 1 tablet (40 mg total) by mouth daily as needed for edema. For fluid overload., Disp: 90 tablet, Rfl: 3 .  LEVEMIR FLEXTOUCH 100 UNIT/ML Pen, Inject 6 Units into the skin daily at 10 pm. , Disp: , Rfl:  .  Liraglutide (VICTOZA) 18 MG/3ML SOLN, Inject 0.6 mg into the skin daily after supper. 4pm, Disp: , Rfl:  .  LIVALO 2 MG TABS, TAKE 1 TABLET (2MG) BY MOUTH EVERY DAY, Disp: 90 tablet, Rfl: 0 .  Magnesium 250 MG TABS, TAKE 1 TABLET BY MOUTH EVERY DAY WITH EVENING MEAL, Disp: , Rfl: 0 .  Multiple Vitamin (MULITIVITAMIN WITH MINERALS) TABS, Take 1 tablet by mouth daily. Centrum Silver, Disp: , Rfl:  .  mupirocin ointment (BACTROBAN) 2 %,  Apply 1 application topically daily. , Disp: , Rfl:  .  ONETOUCH VERIO test strip, USE AS DIRECTED TO CHECK BLOOD SUGARS 1 TIME PER DAY DX: E11.22, Disp: , Rfl: 11 .  vitamin C (ASCORBIC ACID) 500 MG tablet, Take 500 mg by mouth daily., Disp: , Rfl:  .  KLOR-CON M20 20 MEQ tablet, TAKE 1 TABLET (20 MEQ TOTAL) BY MOUTH DAILY. WHEN TAKING FUROSEMIDE (LASIX)., Disp: 90 tablet, Rfl: 2 .  ranitidine (ZANTAC) 150 MG tablet, Take 150 mg by mouth daily as needed for heartburn. , Disp: , Rfl:    Allergies  Allergen Reactions  . Codeine Hives and Nausea And Vomiting  . Warfarin And Related     Severe rectal bleeding     Review of Systems  Constitutional: Negative.   Respiratory: Negative.   Cardiovascular: Negative.   Gastrointestinal: Negative.   Genitourinary: Negative.   Neurological: Negative.   Psychiatric/Behavioral: Negative.      Today's Vitals   04/29/18 1139  BP: 132/78  Pulse: 65  Temp: 97.7 F (36.5 C)  TempSrc: Oral  Weight: 237 lb 6.4 oz (107.7 kg)  Height: '5\' 7"'  (1.702 m)   Body mass index is 37.18 kg/m.   Objective:  Physical Exam  Constitutional: She is oriented to person, place, and time. She appears well-developed and well-nourished.  HENT:  Head: Normocephalic and atraumatic.  Eyes: EOM are normal.  Cardiovascular: Normal rate, regular rhythm and normal heart sounds.  Pulmonary/Chest: Effort normal and breath sounds normal.  Neurological: She is alert and oriented to person, place, and time.  Psychiatric: She has a normal mood and affect.  Nursing note and vitals reviewed.       Assessment And Plan:     1. Type 2 diabetes mellitus with stage 3 chronic kidney disease, with long-term current use of insulin ([place])  I will check labs as listed below. She has been advised to decrease her insulin by 3 units. She is also encouraged to incorporate more exercise into her daily routine.   - CMP14+EGFR - Hemoglobin A1c  2. Chronic renal disease, stage III  (HCC)  Chronic.   3. Hypertensive heart and renal disease with renal failure, stage 1 through stage 4 or unspecified chronic kidney disease, with heart failure ([place])  Controlled. She will continue with current meds. She is encouraged to avoid adding salt to her foods.   4. NICM (nonischemic cardiomyopathy) (HCC)  Chronic, yet stable. Also followed by Cardiology.   5. Class 2 severe obesity due to excess calories with serious comorbidity and body mass index (BMI) of 37.0 to 37.9 in adult (  [place])  She was congratulated on her continud weight loss.  Encouraged to strive for BMI less than 32 to decrease cardiac risk.   6. Need for vaccination  - Flu vaccine HIGH DOSE PF (Fluzone High dose)        Maximino Greenland, MD

## 2018-05-08 ENCOUNTER — Other Ambulatory Visit: Payer: Self-pay | Admitting: Interventional Cardiology

## 2018-05-11 NOTE — Telephone Encounter (Signed)
Outpatient Medication Detail    Disp Refills Start End   enalapril (VASOTEC) 10 MG tablet 90 tablet 0 04/21/2018    Sig - Route: Take 0.5 tablets (5 mg total) by mouth daily. - Oral   Sent to pharmacy as: enalapril (VASOTEC) 10 MG tablet   E-Prescribing Status: Receipt confirmed by pharmacy (04/21/2018 3:34 PM EST)   Pharmacy   [id number] - [place], [place].

## 2018-05-13 ENCOUNTER — Other Ambulatory Visit: Payer: Self-pay

## 2018-05-14 ENCOUNTER — Ambulatory Visit: Payer: Medicare Other

## 2018-05-18 ENCOUNTER — Other Ambulatory Visit: Payer: Self-pay | Admitting: Interventional Cardiology

## 2018-05-18 MED ORDER — ENALAPRIL MALEATE 10 MG PO TABS: 5.0000 mg | ORAL_TABLET | Freq: Every day | ORAL | 3 refills | Status: DC

## 2018-07-01 ENCOUNTER — Other Ambulatory Visit: Payer: Self-pay

## 2018-07-01 NOTE — Patient Outreach (Signed)
[facility]) Care Management  07/01/2018  JAQUANDA WICKERSHAM 1946/04/08 [id number]   Medication Adherence call to Mrs. Pennie Rushing patient did not answer patient is due on Livalo 2 mg. [facility] said last pick up was on 01/2018 for a 90 days supply. Mrs. Chamber is showing past due under [place].   [facility] Management Direct Dial [phone]  Fax [phone] [email or web address]

## 2018-07-30 ENCOUNTER — Ambulatory Visit: Payer: Medicare Other | Admitting: Internal Medicine

## 2018-08-03 ENCOUNTER — Other Ambulatory Visit: Payer: Self-pay

## 2018-08-03 ENCOUNTER — Ambulatory Visit (INDEPENDENT_AMBULATORY_CARE_PROVIDER_SITE_OTHER): Payer: Medicare Other | Admitting: Internal Medicine

## 2018-08-03 ENCOUNTER — Encounter: Payer: Self-pay | Admitting: Internal Medicine

## 2018-08-03 VITALS — BP 132/76 | HR 80 | Temp 97.4°F | Ht 65.8 in | Wt 235.8 lb

## 2018-08-03 DIAGNOSIS — Z122 Encounter for screening for malignant neoplasm of respiratory organs: Secondary | ICD-10-CM

## 2018-08-03 DIAGNOSIS — Z794 Long term (current) use of insulin: Secondary | ICD-10-CM | POA: Diagnosis not present

## 2018-08-03 DIAGNOSIS — N183 Chronic kidney disease, stage 3 (moderate): Secondary | ICD-10-CM

## 2018-08-03 DIAGNOSIS — I428 Other cardiomyopathies: Secondary | ICD-10-CM | POA: Diagnosis not present

## 2018-08-03 DIAGNOSIS — I130 Hypertensive heart and chronic kidney disease with heart failure and stage 1 through stage 4 chronic kidney disease, or unspecified chronic kidney disease: Secondary | ICD-10-CM | POA: Diagnosis not present

## 2018-08-03 DIAGNOSIS — E1122 Type 2 diabetes mellitus with diabetic chronic kidney disease: Secondary | ICD-10-CM | POA: Diagnosis not present

## 2018-08-03 DIAGNOSIS — Z6838 Body mass index (BMI) 38.0-38.9, adult: Secondary | ICD-10-CM

## 2018-08-03 MED ORDER — FLUTICASONE PROPIONATE 50 MCG/ACT NA SUSP: 1.0000 | Freq: Every day | NASAL | 2 refills | Status: DC

## 2018-08-03 NOTE — Patient Instructions (Signed)
Diabetes Mellitus and Nutrition, Adult  When you have diabetes (diabetes mellitus), it is very important to have healthy eating habits because your blood sugar (glucose) levels are greatly affected by what you eat and drink. Eating healthy foods in the appropriate amounts, at about the same times every day, can help you:  · Control your blood glucose.  · Lower your risk of heart disease.  · Improve your blood pressure.  · Reach or maintain a healthy weight.  Every person with diabetes is different, and each person has different needs for a meal plan. Your health care provider may recommend that you work with a diet and nutrition specialist (dietitian) to make a meal plan that is best for you. Your meal plan may vary depending on factors such as:  · The calories you need.  · The medicines you take.  · Your weight.  · Your blood glucose, blood pressure, and cholesterol levels.  · Your activity level.  · Other health conditions you have, such as heart or kidney disease.  How do carbohydrates affect me?  Carbohydrates, also called carbs, affect your blood glucose level more than any other type of food. Eating carbs naturally raises the amount of glucose in your blood. Carb counting is a method for keeping track of how many carbs you eat. Counting carbs is important to keep your blood glucose at a healthy level, especially if you use insulin or take certain oral diabetes medicines.  It is important to know how many carbs you can safely have in each meal. This is different for every person. Your dietitian can help you calculate how many carbs you should have at each meal and for each snack.  Foods that contain carbs include:  · Bread, cereal, rice, pasta, and crackers.  · Potatoes and corn.  · Peas, beans, and lentils.  · Milk and yogurt.  · Fruit and juice.  · Desserts, such as cakes, cookies, ice cream, and candy.  How does alcohol affect me?  Alcohol can cause a sudden decrease in blood glucose (hypoglycemia),  especially if you use insulin or take certain oral diabetes medicines. Hypoglycemia can be a life-threatening condition. Symptoms of hypoglycemia (sleepiness, dizziness, and confusion) are similar to symptoms of having too much alcohol.  If your health care provider says that alcohol is safe for you, follow these guidelines:  · Limit alcohol intake to no more than 1 drink per day for nonpregnant women and 2 drinks per day for men. One drink equals 12 oz of beer, 5 oz of wine, or 1½ oz of hard liquor.  · Do not drink on an empty stomach.  · Keep yourself hydrated with water, diet soda, or unsweetened iced tea.  · Keep in mind that regular soda, juice, and other mixers may contain a lot of sugar and must be counted as carbs.  What are tips for following this plan?    Reading food labels  · Start by checking the serving size on the "Nutrition Facts" label of packaged foods and drinks. The amount of calories, carbs, fats, and other nutrients listed on the label is based on one serving of the item. Many items contain more than one serving per package.  · Check the total grams (g) of carbs in one serving. You can calculate the number of servings of carbs in one serving by dividing the total carbs by 15. For example, if a food has 30 g of total carbs, it would be equal to 2   servings of carbs.  · Check the number of grams (g) of saturated and trans fats in one serving. Choose foods that have low or no amount of these fats.  · Check the number of milligrams (mg) of salt (sodium) in one serving. Most people should limit total sodium intake to less than 2,300 mg per day.  · Always check the nutrition information of foods labeled as "low-fat" or "nonfat". These foods may be higher in added sugar or refined carbs and should be avoided.  · Talk to your dietitian to identify your daily goals for nutrients listed on the label.  Shopping  · Avoid buying canned, premade, or processed foods. These foods tend to be high in fat, sodium,  and added sugar.  · Shop around the outside edge of the grocery store. This includes fresh fruits and vegetables, bulk grains, fresh meats, and fresh dairy.  Cooking  · Use low-heat cooking methods, such as baking, instead of high-heat cooking methods like deep frying.  · Cook using healthy oils, such as olive, canola, or sunflower oil.  · Avoid cooking with butter, cream, or high-fat meats.  Meal planning  · Eat meals and snacks regularly, preferably at the same times every day. Avoid going long periods of time without eating.  · Eat foods high in fiber, such as fresh fruits, vegetables, beans, and whole grains. Talk to your dietitian about how many servings of carbs you can eat at each meal.  · Eat 4-6 ounces (oz) of lean protein each day, such as lean meat, chicken, fish, eggs, or tofu. One oz of lean protein is equal to:  ? 1 oz of meat, chicken, or fish.  ? 1 egg.  ? ¼ cup of tofu.  · Eat some foods each day that contain healthy fats, such as avocado, nuts, seeds, and fish.  Lifestyle  · Check your blood glucose regularly.  · Exercise regularly as told by your health care provider. This may include:  ? 150 minutes of moderate-intensity or vigorous-intensity exercise each week. This could be brisk walking, biking, or water aerobics.  ? Stretching and doing strength exercises, such as yoga or weightlifting, at least 2 times a week.  · Take medicines as told by your health care provider.  · Do not use any products that contain nicotine or tobacco, such as cigarettes and e-cigarettes. If you need help quitting, ask your health care provider.  · Work with a counselor or diabetes educator to identify strategies to manage stress and any emotional and social challenges.  Questions to ask a health care provider  · Do I need to meet with a diabetes educator?  · Do I need to meet with a dietitian?  · What number can I call if I have questions?  · When are the best times to check my blood glucose?  Where to find more  information:  · American Diabetes Association: diabetes.org  · Academy of Nutrition and Dietetics: www.eatright.org  · National Institute of Diabetes and Digestive and Kidney Diseases (NIH): www.niddk.nih.gov  Summary  · A healthy meal plan will help you control your blood glucose and maintain a healthy lifestyle.  · Working with a diet and nutrition specialist (dietitian) can help you make a meal plan that is best for you.  · Keep in mind that carbohydrates (carbs) and alcohol have immediate effects on your blood glucose levels. It is important to count carbs and to use alcohol carefully.  This information is not intended to   replace advice given to you by your health care provider. Make sure you discuss any questions you have with your health care provider.  Document Released: 02/14/2005 Document Revised: 12/18/2016 Document Reviewed: 06/24/2016  Elsevier Interactive Patient Education © 2019 Elsevier Inc.

## 2018-08-03 NOTE — Progress Notes (Signed)
Subjective:     Patient ID: [name] , female    DOB: 06/09/1945 , 73 y.o.   MRN: [id number]   Chief Complaint  Patient presents with  . Diabetes  . Hypertension    HPI  Diabetes  She presents for her follow-up diabetic visit. She has type 2 diabetes mellitus. Her disease course has been stable. There are no hypoglycemic associated symptoms. Pertinent negatives for diabetes include no blurred vision and no chest pain. There are no hypoglycemic complications. Diabetic complications include nephropathy. Risk factors for coronary artery disease include diabetes mellitus, dyslipidemia, hypertension, obesity, post-menopausal and sedentary lifestyle.  Hypertension  This is a chronic problem. The current episode started more than 1 year ago. The problem has been gradually improving since onset. The problem is controlled. Pertinent negatives include no blurred vision, chest pain, palpitations or shortness of breath.     Past Medical History:  Diagnosis Date  . Arthritis    "left knee" (03/12/2013)  . Atrial fibrillation ([place])    Cardioverted to NSR 08/22/2009  . Cardiomyopathy   . CHF (congestive heart failure) ([place])   . CKD (chronic kidney disease)   . Depression   . Dizziness   . DM2 (diabetes mellitus, type 2) ([place])    fasting 80-100 (03/12/2013)  . Drug therapy 1/11   coumadin  . Ejection fraction < 50%    20-25%, echo.06/2009, /  EF 30-35%, diffuse hypokinesis, echo, Oct 16, 2010  . GERD (gastroesophageal reflux disease)   . Gout    "left knee" (03/12/2013)  . History of bronchitis   . History of emphysema   . HLD (hyperlipidemia)   . Hyperkalemia   . Hypertension   . LFT elevation    06/2009  . Mitral regurgitation    mild - echo - 1/11 /  Mild, echo, May15, 2012  . Overweight(278.02)   . Pneumonia    "@ least twice" (03/12/2013)  . Thyroid dysfunction    thyromegally,diffuse,nodule LLL,06/2009  . Tobacco abuse   . Tricuspid regurgitation    moderate - echo -  1/11     Family History  Problem Relation Age of Onset  . Stroke Mother   . Prostate cancer Father   . Colon cancer Other   . Heart disease Other   . Heart attack Son   . Hypertension Sister   . Hypertension Son        [id number]  . Stroke Maternal Grandfather   . Stroke Paternal Grandmother      Current Outpatient Medications:  .  acetaminophen (TYLENOL) 500 MG tablet, Take 1,000 mg by mouth 3 (three) times daily as needed for pain., Disp: , Rfl:  .  carvedilol (COREG) 25 MG tablet, Take 1 tablet (25 mg total) by mouth 2 (two) times daily., Disp: 60 tablet, Rfl: 11 .  Cholecalciferol (VITAMIN D) 2000 UNITS CAPS, Take 2,000 Units by mouth daily., Disp: , Rfl:  .  colchicine 0.6 MG tablet, Take 0.6 mg by mouth 2 (two) times daily as needed (GOUT). , Disp: , Rfl:  .  ELIQUIS 5 MG TABS tablet, TAKE 1 TABLET BY ORAL ROUTE 2 TIMES EVERY DAY, Disp: 180 tablet, Rfl: 1 .  enalapril (VASOTEC) 10 MG tablet, Take 0.5 tablets (5 mg total) by mouth daily., Disp: 90 tablet, Rfl: 3 .  fluocinonide cream (LIDEX) 0.05 %, Apply topically as needed (as directed for dry skin). , Disp: , Rfl: 0 .  furosemide (LASIX) 40 MG tablet, Take 1 tablet (40  mg total) by mouth daily as needed for edema. For fluid overload., Disp: 90 tablet, Rfl: 3 .  LEVEMIR FLEXTOUCH 100 UNIT/ML Pen, Inject 3 Units into the skin daily at 10 pm. , Disp: , Rfl:  .  Liraglutide (VICTOZA) 18 MG/3ML SOLN, Inject 0.6 mg into the skin daily after supper. 4pm, Disp: , Rfl:  .  LIVALO 2 MG TABS, TAKE 1 TABLET (2MG) BY MOUTH EVERY DAY, Disp: 90 tablet, Rfl: 0 .  Magnesium 250 MG TABS, TAKE 1 TABLET BY MOUTH EVERY DAY WITH EVENING MEAL, Disp: , Rfl: 0 .  Multiple Vitamin (MULITIVITAMIN WITH MINERALS) TABS, Take 1 tablet by mouth daily. Centrum Silver, Disp: , Rfl:  .  mupirocin ointment (BACTROBAN) 2 %, Apply 1 application topically daily. , Disp: , Rfl:  .  ONETOUCH VERIO test strip, USE AS DIRECTED TO CHECK BLOOD SUGARS 1 TIME PER DAY DX: E11.22,  Disp: , Rfl: 11 .  vitamin C (ASCORBIC ACID) 500 MG tablet, Take 500 mg by mouth daily., Disp: , Rfl:  .  carvedilol (COREG) 25 MG tablet, Take 1 tablet (25 mg total) by mouth 2 (two) times daily with a meal. (Patient not taking: Reported on 08/03/2018), Disp: 180 tablet, Rfl: 3 .  fluticasone (FLONASE) 50 MCG/ACT nasal spray, Place 1 spray into both nostrils daily., Disp: 16 g, Rfl: 2 .  KLOR-CON M20 20 MEQ tablet, TAKE 1 TABLET (20 MEQ TOTAL) BY MOUTH DAILY. WHEN TAKING FUROSEMIDE (LASIX)., Disp: 90 tablet, Rfl: 2 .  ranitidine (ZANTAC) 150 MG tablet, Take 150 mg by mouth daily as needed for heartburn. , Disp: , Rfl:    Allergies  Allergen Reactions  . Codeine Hives and Nausea And Vomiting  . Warfarin And Related     Severe rectal bleeding     Review of Systems  Constitutional: Negative.   Eyes: Negative for blurred vision.  Respiratory: Negative for shortness of breath.   Cardiovascular: Negative.  Negative for chest pain and palpitations.  Gastrointestinal: Negative.   Neurological: Negative.   Psychiatric/Behavioral: Negative.      Today's Vitals   08/03/18 1201  BP: 132/76  Pulse: 80  Temp: (!) 97.4 F (36.3 C)  TempSrc: Oral  SpO2: 95%  Weight: 235 lb 12.8 oz (107 kg)  Height: 5' 5.8" (1.671 m)   Body mass index is 38.29 kg/m.   Objective:  Physical Exam Vitals signs and nursing note reviewed.  Constitutional:      Appearance: Normal appearance.  HENT:     Head: Normocephalic and atraumatic.  Cardiovascular:     Rate and Rhythm: Normal rate and regular rhythm.     Heart sounds: Normal heart sounds.  Pulmonary:     Effort: Pulmonary effort is normal.     Breath sounds: Normal breath sounds.  Skin:    General: Skin is warm.  Neurological:     General: No focal deficit present.     Mental Status: She is alert.  Psychiatric:        Mood and Affect: Mood normal.        Behavior: Behavior normal.         Assessment And Plan:     1. Type 2 diabetes  mellitus with stage 3 chronic kidney disease, with long-term current use of insulin ([place])  I will check labs as listed below. Importance of dietary, exercise and medication compliance was discussed with the patient. She is encouraged to incorporate more exercise into her daily routine. She is advised to  stop insulin. She will finish her current supply of Victoza, and then switch to Ozempic, 0.36m once weekly.   - Lipid panel - CMP14+EGFR - Hemoglobin A1c  2. Hypertensive heart and renal disease with renal failure, stage 1 through stage 4 or unspecified chronic kidney disease, with heart failure ([place]  Controlled. She will continue with current meds. She is encouraged to limit her salt intake.   3. NICM (nonischemic cardiomyopathy) (H[facility])  Chronic, yet stable. She is encouraged to follow dietary recommendations. I will refer her to another cardiologist per her request.   4. Class 2 severe obesity due to excess calories with serious comorbidity and body mass index (BMI) of 38.0 to 38.9 in adult ([facility]  Importance of achieving optimal weight to decrease risk of cardiovascular disease and cancers was discussed with the patient in full detail. She is encouraged to start slowly - start with 10 minutes twice daily at least three to four days per week and to gradually build to 30 minutes five days weekly. She was given tips to incorporate more activity into her daily routine - take stairs when possible, park farther away from her job, grocery stores, etc.   5. Encounter for screening for malignant neoplasm of respiratory organs  - CT CHEST LUNG CA SCREEN LOW DOSE W/O CM; Future        RMaximino Greenland MD

## 2018-08-04 LAB — CMP14+EGFR
ALT: 20 IU/L (ref 0–32)
AST: 21 IU/L (ref 0–40)
Albumin/Globulin Ratio: 1.6 (ref 1.2–2.2)
Albumin: 4.4 g/dL (ref 3.7–4.7)
Alkaline Phosphatase: 92 IU/L (ref 39–117)
BUN/Creatinine Ratio: 24 (ref 12–28)
BUN: 40 mg/dL — ABNORMAL HIGH (ref 8–27)
Bilirubin Total: 0.3 mg/dL (ref 0.0–1.2)
CO2: 24 mmol/L (ref 20–29)
Calcium: 9.9 mg/dL (ref 8.7–10.3)
Chloride: 101 mmol/L (ref 96–106)
Creatinine, Ser: 1.68 mg/dL — ABNORMAL HIGH (ref 0.57–1.00)
GFR calc Af Amer: 34 mL/min/{1.73_m2} — ABNORMAL LOW (ref >59)
GFR calc non Af Amer: 30 mL/min/{1.73_m2} — ABNORMAL LOW (ref >59)
Globulin, Total: 2.8 g/dL (ref 1.5–4.5)
Glucose: 98 mg/dL (ref 65–99)
Potassium: 4.5 mmol/L (ref 3.5–5.2)
Sodium: 141 mmol/L (ref 134–144)
Total Protein: 7.2 g/dL (ref 6.0–8.5)

## 2018-08-04 LAB — LIPID PANEL
Chol/HDL Ratio: 3.9 ratio (ref 0.0–4.4)
Cholesterol, Total: 151 mg/dL (ref 100–199)
HDL: 39 mg/dL — ABNORMAL LOW (ref >39)
LDL Calculated: 86 mg/dL (ref 0–99)
Triglycerides: 128 mg/dL (ref 0–149)
VLDL Cholesterol Cal: 26 mg/dL (ref 5–40)

## 2018-08-04 LAB — HEMOGLOBIN A1C
Est. average glucose Bld gHb Est-mCnc: 128 mg/dL
Hgb A1c MFr Bld: 6.1 % — ABNORMAL HIGH (ref 4.8–5.6)

## 2018-08-12 ENCOUNTER — Telehealth: Payer: Self-pay | Admitting: Interventional Cardiology

## 2018-08-12 NOTE — Telephone Encounter (Signed)
Dr. Irish Lack said it was ok for this patient to switch doctors to Dr. [name].  Dr. [name] is ok with taking this patient as well.

## 2018-08-23 ENCOUNTER — Telehealth: Payer: Self-pay | Admitting: Nurse Practitioner

## 2018-08-23 NOTE — Telephone Encounter (Signed)
Received on call message from the [facility], reporting patient has a low blood pressure called patient back at preferred number and unable to get through message states "your call can not be completed at this time".  I called the second number on file and left a voicemail to call back to the office with any questions/concerns.

## 2018-09-01 ENCOUNTER — Other Ambulatory Visit: Payer: Self-pay | Admitting: Internal Medicine

## 2018-09-23 ENCOUNTER — Telehealth: Payer: Self-pay | Admitting: Cardiovascular Disease

## 2018-09-23 NOTE — Telephone Encounter (Signed)
lmom for patient to call back and switch appt from Dr Irish Lack on 4/30 to Dr [name] next week

## 2018-09-23 NOTE — Telephone Encounter (Signed)
Patient set up for MyChart? DECLINED -GAVE VERBAL CONSENT  Is patient using Smartphone/computer/tablet? SMARTPHONE  Did audio/video work? PREFER DOXIMITY OVER DOXYME,   Does patient need telephone visit?NO  Best phone number to use? [phone]  Special Instructions? SPOKE WITH PATIENT , SHE IS SWITCHING BECAUSE SHE WANTS A FEMALE DOCTOR, WHO IS STRICT AND STRAIGHTFORWARD WITH PATIENT, NOT GOING TO SUGAR COAT INSTRUCTIONS     Virtual Visit Pre-Appointment Phone Call  "(Name), I am calling you today to discuss your upcoming appointment. We are currently trying to limit exposure to the virus that causes COVID-19 by seeing patients at home rather than in the office."  1. "What is the BEST phone number to call the day of the visit?" - include this in appointment notes  2. Do you have or have access to (through a family member/friend) a smartphone with video capability that we can use for your visit?" a. If yes - list this number in appt notes as cell (if different from BEST phone #) and list the appointment type as a VIDEO visit in appointment notes b. If no - list the appointment type as a PHONE visit in appointment notes  3. Confirm consent - "In the setting of the current Covid19 crisis, you are scheduled for a (phone or video) visit with your provider on (date) at (time).  Just as we do with many in-office visits, in order for you to participate in this visit, we must obtain consent.  If you'd like, I can send this to your mychart (if signed up) or email for you to review.  Otherwise, I can obtain your verbal consent now.  All virtual visits are billed to your insurance company just like a normal visit would be.  By agreeing to a virtual visit, we'd like you to understand that the technology does not allow for your provider to perform an examination, and thus may limit your provider's ability to fully assess your condition. If your provider identifies any concerns that need to be  evaluated in person, we will make arrangements to do so.  Finally, though the technology is pretty good, we cannot assure that it will always work on either your or our end, and in the setting of a video visit, we may have to convert it to a phone-only visit.  In either situation, we cannot ensure that we have a secure connection.  Are you willing to proceed?" STAFF: Did the patient verbally acknowledge consent to telehealth visit? Document YES/NO here: YES -VERBAL CONSENT GIVEN  4. Advise patient to be prepared - "Two hours prior to your appointment, go ahead and check your blood pressure, pulse, oxygen saturation, and your weight (if you have the equipment to check those) and write them all down. When your visit starts, your provider will ask you for this information. If you have an Apple Watch or Kardia device, please plan to have heart rate information ready on the day of your appointment. Please have a pen and paper handy nearby the day of the visit as well."  5. Give patient instructions for MyChart download to smartphone OR Doximity/Doxy.me as below if video visit (depending on what platform provider is using)  6. Inform patient they will receive a phone call 15 minutes prior to their appointment time (may be from unknown caller ID) so they should be prepared to answer    [place] has been deemed a candidate for a follow-up tele-health visit to limit community exposure  during the Covid-19 pandemic. I spoke with the patient via phone to ensure availability of phone/video source, confirm preferred email & phone number, and discuss instructions and expectations.  I reminded [name] to be prepared with any vital sign and/or heart rhythm information that could potentially be obtained via home monitoring, at the time of her visit. I reminded [name] to expect a phone call prior to her visit.  [name] 09/23/2018 11:10 AM   INSTRUCTIONS FOR  DOWNLOADING THE MYCHART APP TO SMARTPHONE  - The patient must first make sure to have activated MyChart and know their login information - If Apple, go to [employer] and type in MyChart in the search bar and download the app. If Android, ask patient to go to [employer] and type in [place] in the search bar and download the app. The app is free but as with any other app downloads, their phone may require them to verify saved payment information or Apple/Android password.  - The patient will need to then log into the app with their MyChart username and password, and select [facility] as their healthcare provider to link the account. When it is time for your visit, go to the MyChart app, find appointments, and click Begin Video Visit. Be sure to Select Allow for your device to access the Microphone and Camera for your visit. You will then be connected, and your provider will be with you shortly.  **If they have any issues connecting, or need assistance please contact MyChart service desk [phone])**  **If using a computer, in order to ensure the best quality for their visit they will need to use either of the following Internet Browsers: [employer], or Google Chrome**  IF USING DOXIMITY or DOXY.ME - The patient will receive a link just prior to their visit by text.     FULL LENGTH CONSENT FOR TELE-HEALTH VISIT   I hereby voluntarily request, consent and authorize [facility] and its employed or contracted physicians, physician assistants, nurse practitioners or other licensed health care professionals (the Practitioner), to provide me with telemedicine health care services (the Services") as deemed necessary by the treating Practitioner. I acknowledge and consent to receive the Services by the Practitioner via telemedicine. I understand that the telemedicine visit will involve communicating with the Practitioner through live audiovisual communication technology and the  disclosure of certain medical information by electronic transmission. I acknowledge that I have been given the opportunity to request an in-person assessment or other available alternative prior to the telemedicine visit and am voluntarily participating in the telemedicine visit.  I understand that I have the right to withhold or withdraw my consent to the use of telemedicine in the course of my care at any time, without affecting my right to future care or treatment, and that the Practitioner or I may terminate the telemedicine visit at any time. I understand that I have the right to inspect all information obtained and/or recorded in the course of the telemedicine visit and may receive copies of available information for a reasonable fee.  I understand that some of the potential risks of receiving the Services via telemedicine include:   Delay or interruption in medical evaluation due to technological equipment failure or disruption;  Information transmitted may not be sufficient (e.g. poor resolution of images) to allow for appropriate medical decision making by the Practitioner; and/or   In rare instances, security protocols could fail, causing a breach of personal health  information.  Furthermore, I acknowledge that it is my responsibility to provide information about my medical history, conditions and care that is complete and accurate to the best of my ability. I acknowledge that Practitioner's advice, recommendations, and/or decision may be based on factors not within their control, such as incomplete or inaccurate data provided by me or distortions of diagnostic images or specimens that may result from electronic transmissions. I understand that the practice of medicine is not an exact science and that Practitioner makes no warranties or guarantees regarding treatment outcomes. I acknowledge that I will receive a copy of this consent concurrently upon execution via email to the email address I  last provided but may also request a printed copy by calling the office of [facility].    I understand that my insurance will be billed for this visit.   I have read or had this consent read to me.  I understand the contents of this consent, which adequately explains the benefits and risks of the Services being provided via telemedicine.   I have been provided ample opportunity to ask questions regarding this consent and the Services and have had my questions answered to my satisfaction.  I give my informed consent for the services to be provided through the use of telemedicine in my medical care  By participating in this telemedicine visit I agree to the above.

## 2018-09-29 ENCOUNTER — Telehealth: Payer: Self-pay | Admitting: Cardiovascular Disease

## 2018-09-29 NOTE — Telephone Encounter (Signed)
Declined mychart, no smartphone, pre reg complete 09/29/18 AF

## 2018-09-30 ENCOUNTER — Encounter: Payer: Self-pay | Admitting: Cardiovascular Disease

## 2018-09-30 ENCOUNTER — Telehealth (INDEPENDENT_AMBULATORY_CARE_PROVIDER_SITE_OTHER): Payer: Medicare Other | Admitting: Cardiovascular Disease

## 2018-09-30 DIAGNOSIS — I4819 Other persistent atrial fibrillation: Secondary | ICD-10-CM | POA: Diagnosis not present

## 2018-09-30 DIAGNOSIS — I5042 Chronic combined systolic (congestive) and diastolic (congestive) heart failure: Secondary | ICD-10-CM | POA: Diagnosis not present

## 2018-09-30 DIAGNOSIS — I10 Essential (primary) hypertension: Secondary | ICD-10-CM | POA: Diagnosis not present

## 2018-09-30 DIAGNOSIS — E7800 Pure hypercholesterolemia, unspecified: Secondary | ICD-10-CM | POA: Diagnosis not present

## 2018-09-30 MED ORDER — METOPROLOL SUCCINATE ER 50 MG PO TB24: 50.0000 mg | ORAL_TABLET | Freq: Every day | ORAL | 3 refills | Status: DC

## 2018-09-30 NOTE — Patient Instructions (Addendum)
Medication Instructions:  STOP CARVEDILOL   START METOPROLOL SUC 50 MG DAILY   If you need a refill on your cardiac medications before your next appointment, please call your pharmacy.   Lab work: NONE  Testing/Procedures: NONE  Follow-Up: Your physician recommends that you schedule a follow-up appointment in: 10/29/18 AT 1:20

## 2018-09-30 NOTE — Progress Notes (Signed)
Virtual Visit via Video Note   This visit type was conducted due to national recommendations for restrictions regarding the COVID-19 Pandemic (e.g. social distancing) in an effort to limit this patient's exposure and mitigate transmission in our community.  Due to her co-morbid illnesses, this patient is at least at moderate risk for complications without adequate follow up.  This format is felt to be most appropriate for this patient at this time.  All issues noted in this document were discussed and addressed.  A limited physical exam was performed with this format.  Please refer to the patient's chart for her consent to telehealth for [facility].   Evaluation Performed:  Follow-up visit  Date:  10/02/2018   ID:  HADEEL HILLEBRAND, DOB 10/31/1945, MRN [id number]  Patient Location: Home Provider Location: Office  PCP:  [name], MD  Cardiologist:  [name], MD  Electrophysiologist:  None   Chief Complaint:  Heart failure  History of Present Illness:    [name] is a 73 y.o. female with chronic systolic and diastolic heart failure (LVEF 25 to 30%, nonischemic), PAF, diabetes, hyperlipidemia, hypertension, and tobacco abuse here for follow up.  She was previously a patient of Dr. Ron Parker and then Dr. Irish Lack.  She was first diagnosed with heart failure 06/2009.  At that time her LVEF was 25%.  This was associated with atrial fibrillation.  She reports having an ablation with Dr. [name] in 1999, though those records are not available at this time.  She has undergone cardioversions for atrial fibrillation with Dr. Ron Parker.  LVEF has ranged from 25 to 35% but has not improved despite beta-blockers and ACE inhibitor.  She had an echo 11/2011 that revealed LVEF 50 to 55%.  However subsequent studies have ranged from 20-35%.  LVEF on Lexiscan Myoview 09/2014 was 41%.  She developed a left bundle branch block pattern in 2014.  Her cardiomyopathy is nonischemic.  She last saw Dr.  Irish Lack 04/2018 and carvedilol was increased due to concern for atrial tachycardia. She notes that she feels more fatigued since making this change.  She notes that since increasing carvedilol she sometimes has a dull headache and she feels shaky.  Her ACE inhibitor was reduced 2/2 CKD III.  Prior to that she called the office 12/2017 with volume overload.  Lately she has been feeling mostly well.   She struggles with limiting salt though she notes that she retains fluid when she has salty foods.  She weighs herself daily and takes lasix as needed, which is usually twice per week.  She doesn't get much formal exercise but walks around the house several times per day.  She has no exertional symptoms.  She denies chest pain, shortness of breath, lower extremity edema, orthopnea or PND>  Her BP has been labile, but last week it was running low in the 80-90s/50-60s.  Her heart rate is typically high.  She previously discussed ICD with Dr. Irish Lack but was not interested.    The patient does not have symptoms concerning for COVID-19 infection (fever, chills, cough, or new shortness of breath).    Past Medical History:  Diagnosis Date  . Arthritis    "left knee" (03/12/2013)  . Atrial fibrillation ([place])    Cardioverted to NSR 08/22/2009  . Cardiomyopathy   . CHF (congestive heart failure) ([place])   . CKD (chronic kidney disease)   . Depression   . Dizziness   . DM2 (diabetes mellitus, type 2) ([place])  fasting 80-100 (03/12/2013)  . Drug therapy 1/11   coumadin  . Ejection fraction < 50%    20-25%, echo.06/2009, /  EF 30-35%, diffuse hypokinesis, echo, Oct 16, 2010  . GERD (gastroesophageal reflux disease)   . Gout    "left knee" (03/12/2013)  . History of bronchitis   . History of emphysema   . HLD (hyperlipidemia)   . Hyperkalemia   . Hypertension   . LFT elevation    06/2009  . Mitral regurgitation    mild - echo - 1/11 /  Mild, echo, May15, 2012  . Overweight(278.02)   . Pneumonia    "@  least twice" (03/12/2013)  . Thyroid dysfunction    thyromegally,diffuse,nodule LLL,06/2009  . Tobacco abuse   . Tricuspid regurgitation    moderate - echo - 1/11   Past Surgical History:  Procedure Laterality Date  . BREAST BIOPSY Right 1980's  . BREAST LUMPECTOMY Right 1980's  . CATARACT EXTRACTION W/ INTRAOCULAR LENS  IMPLANT, BILATERAL Bilateral 2010-2012  . CHOLECYSTECTOMY  ~ 1978  . JOINT REPLACEMENT    . TONSILLECTOMY  1960's  . TOTAL KNEE ARTHROPLASTY Left 03/12/2013  . TOTAL KNEE ARTHROPLASTY Left 03/12/2013   Procedure: TOTAL KNEE ARTHROPLASTY;  Surgeon: Newt Minion, MD;  Location: [facility];  Service: Orthopedics;  Laterality: Left;  Left total knee arthroplasty  . TUBAL LIGATION    . VAGINAL HYSTERECTOMY  1987     Current Meds  Medication Sig  . acetaminophen (TYLENOL) 500 MG tablet Take 1,000 mg by mouth 3 (three) times daily as needed for pain.  . BD PEN NEEDLE NANO U/F 32G X 4 MM MISC USE AS DIRECTED WITH INSULIN PENS  . Cholecalciferol (VITAMIN D) 2000 UNITS CAPS Take 2,000 Units by mouth daily.  . colchicine 0.6 MG tablet Take 0.6 mg by mouth 2 (two) times daily as needed (GOUT).   Marland Kitchen ELIQUIS 5 MG TABS tablet TAKE 1 TABLET BY ORAL ROUTE 2 TIMES EVERY DAY  . enalapril (VASOTEC) 10 MG tablet Take 5 mg by mouth at bedtime.  . fluocinonide cream (LIDEX) 0.05 % Apply topically as needed (as directed for dry skin).   . fluticasone (FLONASE) 50 MCG/ACT nasal spray Place 1 spray into both nostrils daily.  . furosemide (LASIX) 40 MG tablet Take 1 tablet (40 mg total) by mouth daily as needed for edema. For fluid overload.  Marland Kitchen KLOR-CON M20 20 MEQ tablet TAKE 1 TABLET (20 MEQ TOTAL) BY MOUTH DAILY. WHEN TAKING FUROSEMIDE (LASIX).  . Liraglutide (VICTOZA) 18 MG/3ML SOLN Inject 0.6 mg into the skin daily after supper. 4pm  . Multiple Vitamin (MULITIVITAMIN WITH MINERALS) TABS Take 1 tablet by mouth daily. Centrum Silver  . mupirocin ointment (BACTROBAN) 2 % Apply 1 application  topically daily.   [name] VERIO test strip USE AS DIRECTED TO CHECK BLOOD SUGARS 1 TIME PER DAY DX: E11.22  . Pitavastatin Calcium (LIVALO) 2 MG TABS Take by mouth. Pt take 1 tablet M-W-F  . vitamin C (ASCORBIC ACID) 500 MG tablet Take 500 mg by mouth daily.  . [DISCONTINUED] carvedilol (COREG) 25 MG tablet Take 1 tablet (25 mg total) by mouth 2 (two) times daily.  . [DISCONTINUED] Magnesium 250 MG TABS TAKE 1 TABLET BY MOUTH EVERY DAY WITH EVENING MEAL     Allergies:   Codeine and Warfarin and related   Social History   Tobacco Use  . Smoking status: Current Every Day Smoker    Packs/day: 0.50    Years:  41.00    Pack years: 20.50    Types: Cigarettes  . Smokeless tobacco: Never Used  . Tobacco comment: smoked 1.5ppd x 9, 1ppdx40, cut back to 1/2ppd past 5 years  Substance Use Topics  . Alcohol use: No  . Drug use: No     Family Hx: The patient's family history includes Colon cancer in an other family member; Heart attack in her son; Heart disease in an other family member; Hypertension in her sister and son; Prostate cancer in her father; Stroke in her maternal grandfather, mother, and paternal grandmother.  ROS:   Please see the history of present illness.     All other systems reviewed and are negative.   Prior CV studies:   The following studies were reviewed today:  Echo 07/28/15: Study Conclusions  - Left ventricle: The cavity size was normal. There was moderate   concentric hypertrophy. Systolic function was severely reduced.   The estimated ejection fraction was in the range of 25% to 30%.   Diffuse hypokinesis. Features are consistent with a pseudonormal   left ventricular filling pattern, with concomitant abnormal   relaxation and increased filling pressure (grade 2 diastolic   dysfunction). Doppler parameters are consistent with high   ventricular filling pressure. - Aortic valve: Transvalvular velocity was within the normal range.   There was no  stenosis. There was no regurgitation. - Mitral valve: Calcified annulus. Mildly thickened leaflets .   There was trivial regurgitation. - Left atrium: The atrium was mildly dilated. - Right ventricle: The cavity size was normal. Wall thickness was   normal. Systolic function was normal. - Atrial septum: A patent foramen ovale cannot be excluded. - Tricuspid valve: There was mild regurgitation. Peak RV-RA   gradient (S): 30 mm Hg. - Inferior vena cava: The vessel was normal in size. The   respirophasic diameter changes were in the normal range (= 50%),   consistent with normal central venous pressure.  Lexiscan Myoview 09/08/14: LVEF 41%.  No ischemia.   Labs/Other Tests and Data Reviewed:    EKG:  An ECG dated 04/22/19 was personally reviewed today and demonstrated:  Ectopic atrial rhythm.  Rate 95 bpm.  PVCs.  IVCD.  QRS 130 ms.   Recent Labs: 04/21/2018: Hemoglobin 13.8; Magnesium 2.4; Platelets 228 08/03/2018: ALT 20; BUN 40; Creatinine, Ser 1.68; Potassium 4.5; Sodium 141   Recent Lipid Panel Lab Results  Component Value Date/Time   CHOL 151 08/03/2018 02:43 PM   TRIG 128 08/03/2018 02:43 PM   HDL 39 (L) 08/03/2018 02:43 PM   CHOLHDL 3.9 08/03/2018 02:43 PM   CHOLHDL 4.8 06/27/2009 03:28 AM   LDLCALC 86 08/03/2018 02:43 PM    Wt Readings from Last 3 Encounters:  09/30/18 234 lb 8 oz (106.4 kg)  08/03/18 235 lb 12.8 oz (107 kg)  04/29/18 237 lb 6.4 oz (107.7 kg)     Objective:    BP 107/65   Pulse 94   Ht 5' 5.8" (1.671 m)   Wt 234 lb 8 oz (106.4 kg)   BMI 38.08 kg/m  GENERAL: Well-appearing.  No acute distress. HEENT: Pupils equal round.  Oral mucosa unremarkable NECK:  No jugular venous distention, no visible thyromegaly EXT:  No edema, no cyanosis no clubbing SKIN:  No rashes no nodules NEURO:  Speech fluent.  Cranial nerves grossly intact.  Moves all 4 extremities freely PSYCH:  Cognitively intact, oriented to person place and time   ASSESSMENT & PLAN:     #  Chronic systolic and diastolic heart failure: NICM.  Most recent echo in 2017 revealed LVEF 20-25%. LVEF was >35% on nuclear imaging.  In the past she hasn't been uninterested in an ICD.  Given LBBB would consider CRT-P/D.  She is thinking about this.  She has symptomatic heart failure with episodes of volume overload.  We discussed limiting salt intake.  Her HR has been high and BP low.  We will stop carvedilol and switch to metoprolol 50mg  daily.  Plan to switch enalapril to Entresto at follow up if BP allows.  She will also benefit from spironolactone.  # Essential hypertension: BP controlled.  Switch carvedilol to metoprolol as above.  Continue enalapril for now.   # Hyperlipidemia: Ms. Casco is on pitavastatin 2mg  daily.  LDL was 86 on 08/2018.  Goal is <70 given DM.    # Persistent atrial fibrillation: Switch carvedilol to metoprolol.  Continue Eliquis.   # Tobacco abuse: Down to 7 cigarettes per day.  She is working with Dr. Baird Cancer to quit.    COVID-19 Education: The signs and symptoms of COVID-19 were discussed with the patient and how to seek care for testing (follow up with PCP or arrange E-visit).  The importance of social distancing was discussed today.  Time:   Today, I have spent 35 minutes with the patient with telehealth technology discussing the above problems.     Medication Adjustments/Labs and Tests Ordered: Current medicines are reviewed at length with the patient today.  Concerns regarding medicines are outlined above.   Tests Ordered: No orders of the defined types were placed in this encounter.   Medication Changes: Meds ordered this encounter  Medications  . metoprolol succinate (TOPROL-XL) 50 MG 24 hr tablet    Sig: Take 1 tablet (50 mg total) by mouth daily. Take with or immediately following a meal.    Dispense:  90 tablet    Refill:  3    D/C CARVEDILOL    Disposition:  Follow up in 1 month(s)  Signed, [name], MD  10/02/2018 1:33  PM    [facility]

## 2018-10-01 ENCOUNTER — Ambulatory Visit: Payer: Medicare Other | Admitting: Interventional Cardiology

## 2018-10-10 ENCOUNTER — Other Ambulatory Visit: Payer: Self-pay | Admitting: Internal Medicine

## 2018-10-22 ENCOUNTER — Telehealth: Payer: Self-pay

## 2018-10-22 NOTE — Telephone Encounter (Signed)
Virtual Visit Pre-Appointment Phone Call [name] PHONE  "(Name), I am calling you today to discuss your upcoming appointment. We are currently trying to limit exposure to the virus that causes COVID-19 by seeing patients at home rather than in the office."  1. "What is the BEST phone number to call the day of the visit?" - include this in appointment notes  2. "Do you have or have access to (through a family member/friend) a smartphone with video capability that we can use for your visit?" a. If yes - list this number in appt notes as "cell" (if different from BEST phone #) and list the appointment type as a VIDEO visit in appointment notes b. If no - list the appointment type as a PHONE visit in appointment notes  3. Confirm consent - "In the setting of the current Covid19 crisis, you are scheduled for a (phone or video) visit with your provider on (date) at (time).  Just as we do with many in-office visits, in order for you to participate in this visit, we must obtain consent.  If you'd like, I can send this to your mychart (if signed up) or email for you to review.  Otherwise, I can obtain your verbal consent now.  All virtual visits are billed to your insurance company just like a normal visit would be.  By agreeing to a virtual visit, we'd like you to understand that the technology does not allow for your provider to perform an examination, and thus may limit your provider's ability to fully assess your condition. If your provider identifies any concerns that need to be evaluated in person, we will make arrangements to do so.  Finally, though the technology is pretty good, we cannot assure that it will always work on either your or our end, and in the setting of a video visit, we may have to convert it to a phone-only visit.  In either situation, we cannot ensure that we have a secure connection.  Are you willing to proceed?" STAFF: Did the patient verbally acknowledge consent to  telehealth visit? Document YES/NO here:   4. Advise patient to be prepared - "Two hours prior to your appointment, go ahead and check your blood pressure, pulse, oxygen saturation, and your weight (if you have the equipment to check those) and write them all down. When your visit starts, your provider will ask you for this information. If you have an Apple Watch or Kardia device, please plan to have heart rate information ready on the day of your appointment. Please have a pen and paper handy nearby the day of the visit as well."  5. Give patient instructions for MyChart download to smartphone OR Doximity/Doxy.me as below if video visit (depending on what platform provider is using)  6. Inform patient they will receive a phone call 15 minutes prior to their appointment time (may be from unknown caller ID) so they should be prepared to answer    [name] has been deemed a candidate for a follow-up tele-health visit to limit community exposure during the Covid-19 pandemic. I spoke with the patient via phone to ensure availability of phone/video source, confirm preferred email & phone number, and discuss instructions and expectations.  I reminded [name] to be prepared with any vital sign and/or heart rhythm information that could potentially be obtained via home monitoring, at the time of her visit. I reminded [name] to expect a  phone call prior to her visit.  [place] 10/22/2018 4:50 PM   INSTRUCTIONS FOR DOWNLOADING THE MYCHART APP TO SMARTPHONE  - The patient must first make sure to have activated MyChart and know their login information - If Apple, go to [employer] and type in MyChart in the search bar and download the app. If Android, ask patient to go to [employer] and type in [place] in the search bar and download the app. The app is free but as with any other app downloads, their phone may require them to verify saved payment  information or Apple/Android password.  - The patient will need to then log into the app with their MyChart username and password, and select [facility] as their healthcare provider to link the account. When it is time for your visit, go to the MyChart app, find appointments, and click Begin Video Visit. Be sure to Select Allow for your device to access the Microphone and Camera for your visit. You will then be connected, and your provider will be with you shortly.  **If they have any issues connecting, or need assistance please contact [facility] [phone])  **If using a computer, in order to ensure the best quality for their visit they will need to use either of the following Internet Browsers: [employer], or Google Chrome  IF USING DOXIMITY or DOXY.ME - The patient will receive a link just prior to their visit by text.     FULL LENGTH CONSENT FOR TELE-HEALTH VISIT   I hereby voluntarily request, consent and authorize [facility] and its employed or contracted physicians, physician assistants, nurse practitioners or other licensed health care professionals (the Practitioner), to provide me with telemedicine health care services (the "Services") as deemed necessary by the treating Practitioner. I acknowledge and consent to receive the Services by the Practitioner via telemedicine. I understand that the telemedicine visit will involve communicating with the Practitioner through live audiovisual communication technology and the disclosure of certain medical information by electronic transmission. I acknowledge that I have been given the opportunity to request an in-person assessment or other available alternative prior to the telemedicine visit and am voluntarily participating in the telemedicine visit.  I understand that I have the right to withhold or withdraw my consent to the use of telemedicine in the course of my care at any time, without affecting my right to  future care or treatment, and that the Practitioner or I may terminate the telemedicine visit at any time. I understand that I have the right to inspect all information obtained and/or recorded in the course of the telemedicine visit and may receive copies of available information for a reasonable fee.  I understand that some of the potential risks of receiving the Services via telemedicine include:  Marland Kitchen Delay or interruption in medical evaluation due to technological equipment failure or disruption; . Information transmitted may not be sufficient (e.g. poor resolution of images) to allow for appropriate medical decision making by the Practitioner; and/or  . In rare instances, security protocols could fail, causing a breach of personal health information.  Furthermore, I acknowledge that it is my responsibility to provide information about my medical history, conditions and care that is complete and accurate to the best of my ability. I acknowledge that Practitioner's advice, recommendations, and/or decision may be based on factors not within their control, such as incomplete or inaccurate data provided by me or distortions of diagnostic images or specimens that may  result from electronic transmissions. I understand that the practice of medicine is not an exact science and that Practitioner makes no warranties or guarantees regarding treatment outcomes. I acknowledge that I will receive a copy of this consent concurrently upon execution via email to the email address I last provided but may also request a printed copy by calling the office of [facility].    I understand that my insurance will be billed for this visit.   I have read or had this consent read to me. . I understand the contents of this consent, which adequately explains the benefits and risks of the Services being provided via telemedicine.  . I have been provided ample opportunity to ask questions regarding this consent and the Services  and have had my questions answered to my satisfaction. . I give my informed consent for the services to be provided through the use of telemedicine in my medical care  By participating in this telemedicine visit I agree to the above.

## 2018-10-27 ENCOUNTER — Telehealth: Payer: Self-pay | Admitting: Cardiovascular Disease

## 2018-10-27 NOTE — Telephone Encounter (Signed)
Smartphone CALL PT CELL [phone], pre-reg complete, declined mychart, verbal consent given 10/27/2018 MS

## 2018-10-29 ENCOUNTER — Telehealth (INDEPENDENT_AMBULATORY_CARE_PROVIDER_SITE_OTHER): Payer: Medicare Other | Admitting: Cardiovascular Disease

## 2018-10-29 VITALS — BP 99/61 | HR 100 | Ht 67.0 in | Wt 233.0 lb

## 2018-10-29 DIAGNOSIS — N183 Chronic kidney disease, stage 3 unspecified: Secondary | ICD-10-CM

## 2018-10-29 DIAGNOSIS — I5042 Chronic combined systolic (congestive) and diastolic (congestive) heart failure: Secondary | ICD-10-CM

## 2018-10-29 DIAGNOSIS — Z79899 Other long term (current) drug therapy: Secondary | ICD-10-CM

## 2018-10-29 DIAGNOSIS — I4819 Other persistent atrial fibrillation: Secondary | ICD-10-CM | POA: Diagnosis not present

## 2018-10-29 DIAGNOSIS — Z5181 Encounter for therapeutic drug level monitoring: Secondary | ICD-10-CM

## 2018-10-29 MED ORDER — DIGOXIN 125 MCG PO TABS: 0.1250 mg | ORAL_TABLET | Freq: Every day | ORAL | 3 refills | Status: DC

## 2018-10-29 NOTE — Patient Instructions (Addendum)
Medication Instructions:  START DIGOXIN 0.125 MG DAILY  If you need a refill on your cardiac medications before your next appointment, please call your pharmacy.   Lab work: BMET/DIG LEVEL IN 1 WEEK TO BE DONE IN AM PRIOR TO TAKING YOUR MORNING DOSE OF DIGOXIN   If you have labs (blood work) drawn today and your tests are completely normal, you will receive your results only by: Marland Kitchen MyChart Message (if you have MyChart) OR . A paper copy in the mail If you have any lab test that is abnormal or we need to change your treatment, we will call you to review the results.  Testing/Procedures: NONE   Follow-Up: Your physician recommends that you schedule a follow-up appointment in: 3 MONTHS WITH DR [facility] 9 /16/20 AT 9:20 AM   Any Other Special Instructions Will Be Listed Below (If Applicable).   Call if HR is still running >80

## 2018-10-29 NOTE — Progress Notes (Signed)
Virtual Visit via Video Note   This visit type was conducted due to national recommendations for restrictions regarding the COVID-19 Pandemic (e.g. social distancing) in an effort to limit this patient's exposure and mitigate transmission in our community.  Due to her co-morbid illnesses, this patient is at least at moderate risk for complications without adequate follow up.  This format is felt to be most appropriate for this patient at this time.  All issues noted in this document were discussed and addressed.  A limited physical exam was performed with this format.  Please refer to the patient's chart for her consent to telehealth for [facility].   Evaluation Performed:  Follow-up visit  Date:  10/29/2018   ID:  [name], DOB May 25, 1946, MRN [id number]  Patient Location: Home Provider Location: Office  PCP:  [name], MD  Cardiologist:  [name], MD  Electrophysiologist:  None   Chief Complaint:  Heart failure  History of Present Illness:    [name] is a 73 y.o. female with chronic systolic and diastolic heart failure (LVEF 25 to 30%, nonischemic), PAF, diabetes, hyperlipidemia, hypertension, and tobacco abuse here for follow up.  She was previously a patient of Dr. Ron Parker and then Dr. Irish Lack.  She was first diagnosed with heart failure 06/2009.  At that time her LVEF was 25%.  This was associated with atrial fibrillation.  She reports having an ablation with Dr. [name] in 1999, though those records are not available at this time.  She has undergone cardioversions for atrial fibrillation with Dr. Ron Parker.  LVEF has ranged from 25 to 35% but has not improved despite beta-blockers and ACE inhibitor.  She had an echo 11/2011 that revealed LVEF 50 to 55%.  However subsequent studies have ranged from 20-35%.  LVEF on Lexiscan Myoview 09/2014 was 41%.  She developed a left bundle branch block pattern in 2014.  Her cardiomyopathy is nonischemic.  She last saw Dr.  Irish Lack 04/2018 and carvedilol was increased due to concern for atrial tachycardia. She notes that she feels more fatigued since making this change.  She notes that since increasing carvedilol she sometimes has a dull headache and she feels shaky.  Her ACE inhibitor was reduced 2/2 CKD III.  Prior to that she called the office 12/2017 with volume overload.  She struggles with limiting salt though she notes that she retains fluid when she has salty foods.  She weighs herself daily and takes lasix as needed, which is usually twice per week.  She doesn't get much formal exercise but walks around the house several times per day.  She has no exertional symptoms.  She denies chest pain, shortness of breath, lower extremity edema, orthopnea or PND>  Her BP has been labile, but last week it was running low in the 80-90s/50-60s.  Her heart rate is typically high.  She previously discussed ICD with Dr. Irish Lack but was not interested.   At her last appointment carvedilol was switched to metoprolol due to hypotension and tachycardia.  She has been feeling better since making this change.  She notices that her heart rate is elevated but her energy levels are improved.  She has no chest pain and her breathing is stable.  She has no lower extremity edema, orthopnea or PND.  She notices that she is feeling more relaxed.  The patient does not have symptoms concerning for COVID-19 infection (fever, chills, cough, or new shortness of breath).    Past Medical History:  Diagnosis Date  . Arthritis    "left knee" (03/12/2013)  . Atrial fibrillation ([place])    Cardioverted to NSR 08/22/2009  . Cardiomyopathy   . CHF (congestive heart failure) ([place])   . CKD (chronic kidney disease)   . Depression   . Dizziness   . DM2 (diabetes mellitus, type 2) ([place])    fasting 80-100 (03/12/2013)  . Drug therapy 1/11   coumadin  . Ejection fraction < 50%    20-25%, echo.06/2009, /  EF 30-35%, diffuse hypokinesis, echo, Oct 16, 2010   . GERD (gastroesophageal reflux disease)   . Gout    "left knee" (03/12/2013)  . History of bronchitis   . History of emphysema   . HLD (hyperlipidemia)   . Hyperkalemia   . Hypertension   . LFT elevation    06/2009  . Mitral regurgitation    mild - echo - 1/11 /  Mild, echo, May15, 2012  . Overweight(278.02)   . Pneumonia    "@ least twice" (03/12/2013)  . Thyroid dysfunction    thyromegally,diffuse,nodule LLL,06/2009  . Tobacco abuse   . Tricuspid regurgitation    moderate - echo - 1/11   Past Surgical History:  Procedure Laterality Date  . BREAST BIOPSY Right 1980's  . BREAST LUMPECTOMY Right 1980's  . CATARACT EXTRACTION W/ INTRAOCULAR LENS  IMPLANT, BILATERAL Bilateral 2010-2012  . CHOLECYSTECTOMY  ~ 1978  . JOINT REPLACEMENT    . TONSILLECTOMY  1960's  . TOTAL KNEE ARTHROPLASTY Left 03/12/2013  . TOTAL KNEE ARTHROPLASTY Left 03/12/2013   Procedure: TOTAL KNEE ARTHROPLASTY;  Surgeon: Newt Minion, MD;  Location: [place];  Service: Orthopedics;  Laterality: Left;  Left total knee arthroplasty  . TUBAL LIGATION    . VAGINAL HYSTERECTOMY  1987     Current Meds  Medication Sig  . acetaminophen (TYLENOL) 500 MG tablet Take 1,000 mg by mouth 3 (three) times daily as needed for pain.  . BD PEN NEEDLE NANO U/F 32G X 4 MM MISC USE AS DIRECTED WITH INSULIN PENS  . Cholecalciferol (VITAMIN D) 2000 UNITS CAPS Take 2,000 Units by mouth daily.  . colchicine 0.6 MG tablet Take 0.6 mg by mouth 2 (two) times daily as needed (GOUT).   Marland Kitchen ELIQUIS 5 MG TABS tablet TAKE 1 TABLET BY ORAL ROUTE 2 TIMES EVERY DAY  . enalapril (VASOTEC) 10 MG tablet Take 5 mg by mouth at bedtime.  . fluocinonide cream (LIDEX) 0.05 % Apply topically as needed (as directed for dry skin).   . fluticasone (FLONASE) 50 MCG/ACT nasal spray Place 1 spray into both nostrils daily.  . furosemide (LASIX) 40 MG tablet Take 1 tablet (40 mg total) by mouth daily as needed for edema. For fluid overload.  . Liraglutide  (VICTOZA) 18 MG/3ML SOLN Inject 0.6 mg into the skin daily after supper. 4pm  . metoprolol succinate (TOPROL-XL) 50 MG 24 hr tablet Take 1 tablet (50 mg total) by mouth daily. Take with or immediately following a meal.  . Multiple Vitamin (MULITIVITAMIN WITH MINERALS) TABS Take 1 tablet by mouth daily. Centrum Silver  . mupirocin ointment (BACTROBAN) 2 % Apply 1 application topically daily.   [name] VERIO test strip USE AS DIRECTED TO CHECK BLOOD SUGARS 1 TIME PER DAY DX: E11.22  . Pitavastatin Calcium (LIVALO) 2 MG TABS Take by mouth. Pt take 1 tablet M-W-F  . vitamin C (ASCORBIC ACID) 500 MG tablet Take 500 mg by mouth daily.     Allergies:  Codeine and Warfarin and related   Social History   Tobacco Use  . Smoking status: Current Every Day Smoker    Packs/day: 0.50    Years: 41.00    Pack years: 20.50    Types: Cigarettes  . Smokeless tobacco: Never Used  . Tobacco comment: smoked 1.5ppd x 9, 1ppdx40, cut back to 1/2ppd past 5 years  Substance Use Topics  . Alcohol use: No  . Drug use: No     Family Hx: The patient's family history includes Colon cancer in an other family member; Heart attack in her son; Heart disease in an other family member; Hypertension in her sister and son; Prostate cancer in her father; Stroke in her maternal grandfather, mother, and paternal grandmother.  ROS:   Please see the history of present illness.     All other systems reviewed and are negative.   Prior CV studies:   The following studies were reviewed today:  Echo 07/28/15: Study Conclusions  - Left ventricle: The cavity size was normal. There was moderate   concentric hypertrophy. Systolic function was severely reduced.   The estimated ejection fraction was in the range of 25% to 30%.   Diffuse hypokinesis. Features are consistent with a pseudonormal   left ventricular filling pattern, with concomitant abnormal   relaxation and increased filling pressure (grade 2 diastolic    dysfunction). Doppler parameters are consistent with high   ventricular filling pressure. - Aortic valve: Transvalvular velocity was within the normal range.   There was no stenosis. There was no regurgitation. - Mitral valve: Calcified annulus. Mildly thickened leaflets .   There was trivial regurgitation. - Left atrium: The atrium was mildly dilated. - Right ventricle: The cavity size was normal. Wall thickness was   normal. Systolic function was normal. - Atrial septum: A patent foramen ovale cannot be excluded. - Tricuspid valve: There was mild regurgitation. Peak RV-RA   gradient (S): 30 mm Hg. - Inferior vena cava: The vessel was normal in size. The   respirophasic diameter changes were in the normal range (= 50%),   consistent with normal central venous pressure.  Lexiscan Myoview 09/08/14: LVEF 41%.  No ischemia.   Labs/Other Tests and Data Reviewed:    EKG:  An ECG dated 04/22/19 was personally reviewed today and demonstrated:  Ectopic atrial rhythm.  Rate 95 bpm.  PVCs.  IVCD.  QRS 130 ms.   Recent Labs: 04/21/2018: Hemoglobin 13.8; Magnesium 2.4; Platelets 228 08/03/2018: ALT 20; BUN 40; Creatinine, Ser 1.68; Potassium 4.5; Sodium 141   Recent Lipid Panel Lab Results  Component Value Date/Time   CHOL 151 08/03/2018 02:43 PM   TRIG 128 08/03/2018 02:43 PM   HDL 39 (L) 08/03/2018 02:43 PM   CHOLHDL 3.9 08/03/2018 02:43 PM   CHOLHDL 4.8 06/27/2009 03:28 AM   LDLCALC 86 08/03/2018 02:43 PM    Wt Readings from Last 3 Encounters:  10/29/18 233 lb (105.7 kg)  09/30/18 234 lb 8 oz (106.4 kg)  08/03/18 235 lb 12.8 oz (107 kg)     Objective:    BP 99/61   Pulse 100   Ht 5\' 7"  (1.702 m)   Wt 233 lb (105.7 kg)   SpO2 98%   BMI 36.49 kg/m  GENERAL: Well-appearing.  No acute distress. HEENT: Pupils equal round.  Oral mucosa unremarkable NECK:  No jugular venous distention, no visible thyromegaly EXT:  No edema, no cyanosis no clubbing SKIN:  No rashes no nodules  NEURO:  Speech fluent.  Cranial nerves grossly intact.  Moves all 4 extremities freely PSYCH:  Cognitively intact, oriented to person place and time   ASSESSMENT & PLAN:    # Chronic systolic and diastolic heart failure: NICM.  Most recent echo in 2017 revealed LVEF 20-25%. LVEF was >35% on nuclear imaging.  In the past she has been uninterested in an ICD.  Given LBBB would consider CRT-P/D.  She is now willing to get a CRT-D but is afraid to go to the hospital 2/2 COVID-19.  We will discuss this at follow up and get her set up.  She has symptomatic heart failure with episodes of volume overload but is doing well now.  Continue metoprolol and enalapril.  Will get Entresto and spironolactone started if BP allows.  We will start digoxin 0.125mg  daily due to poor HR control in afib and heart failure.  No ivabradine given her atrial fibrillation.  # Hyperlipidemia: Ms. Szafran is on pitavastatin 2mg  daily.  LDL was 86 on 08/2018.  Goal is <70 given DM.    # Persistent atrial fibrillation: Continue metoprolol and start digoxin as above.  Check digoxin level and BMP in one week.  Continue Eliquis.   # Tobacco abuse: Down to 7 cigarettes per day.  She is working with Dr. Baird Cancer to quit.    COVID-19 Education: The signs and symptoms of COVID-19 were discussed with the patient and how to seek care for testing (follow up with PCP or arrange E-visit).  The importance of social distancing was discussed today.  Time:   Today, I have spent 25 minutes with the patient with telehealth technology discussing the above problems.     Medication Adjustments/Labs and Tests Ordered: Current medicines are reviewed at length with the patient today.  Concerns regarding medicines are outlined above.   Tests Ordered: Orders Placed This Encounter  Procedures  . Digoxin level  . Basic metabolic panel    Medication Changes: Meds ordered this encounter  Medications  . digoxin (LANOXIN) 0.125 MG tablet     Sig: Take 1 tablet (0.125 mg total) by mouth daily.    Dispense:  90 tablet    Refill:  3    Disposition:  Follow up in 3 month(s)  Signed, [name], MD  10/29/2018 2:28 PM    [place]

## 2018-11-02 ENCOUNTER — Encounter

## 2018-11-03 ENCOUNTER — Ambulatory Visit: Payer: Medicare Other | Admitting: Internal Medicine

## 2018-11-25 ENCOUNTER — Ambulatory Visit: Payer: Medicare Other | Admitting: Internal Medicine

## 2018-11-26 ENCOUNTER — Ambulatory Visit: Payer: Medicare Other

## 2018-11-26 ENCOUNTER — Ambulatory Visit: Payer: Medicare Other | Admitting: Internal Medicine

## 2018-11-30 ENCOUNTER — Ambulatory Visit (INDEPENDENT_AMBULATORY_CARE_PROVIDER_SITE_OTHER): Payer: Medicare Other | Admitting: Internal Medicine

## 2018-11-30 ENCOUNTER — Encounter: Payer: Self-pay | Admitting: Internal Medicine

## 2018-11-30 VITALS — BP 102/58 | HR 96 | Temp 97.1°F | Ht 67.0 in | Wt 231.0 lb

## 2018-11-30 DIAGNOSIS — E1122 Type 2 diabetes mellitus with diabetic chronic kidney disease: Secondary | ICD-10-CM | POA: Diagnosis not present

## 2018-11-30 DIAGNOSIS — N183 Chronic kidney disease, stage 3 (moderate): Secondary | ICD-10-CM

## 2018-11-30 DIAGNOSIS — Z79899 Other long term (current) drug therapy: Secondary | ICD-10-CM

## 2018-11-30 DIAGNOSIS — I428 Other cardiomyopathies: Secondary | ICD-10-CM

## 2018-11-30 DIAGNOSIS — I480 Paroxysmal atrial fibrillation: Secondary | ICD-10-CM

## 2018-11-30 DIAGNOSIS — Z23 Encounter for immunization: Secondary | ICD-10-CM

## 2018-11-30 DIAGNOSIS — Z794 Long term (current) use of insulin: Secondary | ICD-10-CM

## 2018-11-30 DIAGNOSIS — I130 Hypertensive heart and chronic kidney disease with heart failure and stage 1 through stage 4 chronic kidney disease, or unspecified chronic kidney disease: Secondary | ICD-10-CM

## 2018-11-30 DIAGNOSIS — Z6836 Body mass index (BMI) 36.0-36.9, adult: Secondary | ICD-10-CM

## 2018-11-30 DIAGNOSIS — E66812 Obesity, class 2: Secondary | ICD-10-CM

## 2018-11-30 MED ORDER — PREVNAR 13 IM SUSP: 0.5000 mL | INTRAMUSCULAR | 0 refills | Status: AC

## 2018-11-30 NOTE — Patient Instructions (Signed)

## 2018-12-01 ENCOUNTER — Other Ambulatory Visit: Payer: Self-pay

## 2018-12-01 ENCOUNTER — Other Ambulatory Visit: Payer: Medicare Other

## 2018-12-01 DIAGNOSIS — Z79899 Other long term (current) drug therapy: Secondary | ICD-10-CM | POA: Diagnosis not present

## 2018-12-01 DIAGNOSIS — E1122 Type 2 diabetes mellitus with diabetic chronic kidney disease: Secondary | ICD-10-CM | POA: Diagnosis not present

## 2018-12-01 DIAGNOSIS — N183 Chronic kidney disease, stage 3 unspecified: Secondary | ICD-10-CM

## 2018-12-01 DIAGNOSIS — Z794 Long term (current) use of insulin: Secondary | ICD-10-CM | POA: Diagnosis not present

## 2018-12-01 DIAGNOSIS — I428 Other cardiomyopathies: Secondary | ICD-10-CM

## 2018-12-02 LAB — BMP8+EGFR
BUN/Creatinine Ratio: 16 (ref 12–28)
BUN: 24 mg/dL (ref 8–27)
CO2: 23 mmol/L (ref 20–29)
Calcium: 9.6 mg/dL (ref 8.7–10.3)
Chloride: 102 mmol/L (ref 96–106)
Creatinine, Ser: 1.53 mg/dL — ABNORMAL HIGH (ref 0.57–1.00)
GFR calc Af Amer: 38 mL/min/{1.73_m2} — ABNORMAL LOW (ref >59)
GFR calc non Af Amer: 33 mL/min/{1.73_m2} — ABNORMAL LOW (ref >59)
Glucose: 114 mg/dL — ABNORMAL HIGH (ref 65–99)
Potassium: 4.1 mmol/L (ref 3.5–5.2)
Sodium: 141 mmol/L (ref 134–144)

## 2018-12-02 LAB — BRAIN NATRIURETIC PEPTIDE: BNP: 75.1 pg/mL (ref 0.0–100.0)

## 2018-12-02 LAB — DIGOXIN LEVEL: Digoxin, Serum: 0.7 ng/mL (ref 0.5–0.9)

## 2018-12-02 LAB — HEMOGLOBIN A1C
Est. average glucose Bld gHb Est-mCnc: 126 mg/dL
Hgb A1c MFr Bld: 6 % — ABNORMAL HIGH (ref 4.8–5.6)

## 2018-12-02 LAB — TSH: TSH: 1.05 u[IU]/mL (ref 0.450–4.500)

## 2018-12-06 NOTE — Progress Notes (Signed)
Virtual Visit via Video   This visit type was conducted due to national recommendations for restrictions regarding the COVID-19 Pandemic (e.g. social distancing) in an effort to limit this patient's exposure and mitigate transmission in our community.  Due to her co-morbid illnesses, this patient is at least at moderate risk for complications without adequate follow up.  This format is felt to be most appropriate for this patient at this time.  All issues noted in this document were discussed and addressed.  A limited physical exam was performed with this format.    This visit type was conducted due to national recommendations for restrictions regarding the COVID-19 Pandemic (e.g. social distancing) in an effort to limit this patient's exposure and mitigate transmission in our community.  Patients identity confirmed using two different identifiers.  This format is felt to be most appropriate for this patient at this time.  All issues noted in this document were discussed and addressed.  No physical exam was performed (except for noted visual exam findings with Video Visits).    Date:  12/06/2018   ID:  [name], DOB August 29, 1945, MRN [id number]  Patient Location:  Home  Provider location:   Office    Chief Complaint:  I need a diabetes check.   History of Present Illness:    [name] is a 73 y.o. female who presents via video conferencing for a telehealth visit today.    The patient does not have symptoms concerning for COVID-19 infection (fever, chills, cough, or new shortness of breath).   She presents today for virtual visit. She prefers this method of contact due to COVID-19 pandemic.  Diabetes She presents for her follow-up diabetic visit. She has type 2 diabetes mellitus. Her disease course has been stable. There are no hypoglycemic associated symptoms. Pertinent negatives for diabetes include no blurred vision. There are no hypoglycemic complications. Diabetic  complications include nephropathy. Risk factors for coronary artery disease include diabetes mellitus, dyslipidemia, hypertension, obesity, post-menopausal and sedentary lifestyle. She participates in exercise intermittently.  Hypertension This is a chronic problem. The current episode started more than 1 year ago. The problem has been gradually improving since onset. The problem is controlled. Pertinent negatives include no blurred vision. Risk factors for coronary artery disease include diabetes mellitus, dyslipidemia, obesity, post-menopausal state and sedentary lifestyle. The current treatment provides moderate improvement.     Past Medical History:  Diagnosis Date  . Arthritis    "left knee" (03/12/2013)  . Atrial fibrillation ([place])    Cardioverted to NSR 08/22/2009  . Cardiomyopathy   . CHF (congestive heart failure) ([place])   . CKD (chronic kidney disease)   . Depression   . Dizziness   . DM2 (diabetes mellitus, type 2) ([place])    fasting 80-100 (03/12/2013)  . Drug therapy 1/11   coumadin  . Ejection fraction < 50%    20-25%, echo.06/2009, /  EF 30-35%, diffuse hypokinesis, echo, Oct 16, 2010  . GERD (gastroesophageal reflux disease)   . Gout    "left knee" (03/12/2013)  . History of bronchitis   . History of emphysema   . HLD (hyperlipidemia)   . Hyperkalemia   . Hypertension   . LFT elevation    06/2009  . Mitral regurgitation    mild - echo - 1/11 /  Mild, echo, May15, 2012  . Overweight(278.02)   . Pneumonia    "@ least twice" (03/12/2013)  . Thyroid dysfunction    thyromegally,diffuse,nodule LLL,06/2009  . Tobacco  abuse   . Tricuspid regurgitation    moderate - echo - 1/11   Past Surgical History:  Procedure Laterality Date  . BREAST BIOPSY Right 1980's  . BREAST LUMPECTOMY Right 1980's  . CATARACT EXTRACTION W/ INTRAOCULAR LENS  IMPLANT, BILATERAL Bilateral 2010-2012  . CHOLECYSTECTOMY  ~ 1978  . JOINT REPLACEMENT    . TONSILLECTOMY  1960's  . TOTAL KNEE  ARTHROPLASTY Left 03/12/2013  . TOTAL KNEE ARTHROPLASTY Left 03/12/2013   Procedure: TOTAL KNEE ARTHROPLASTY;  Surgeon: Newt Minion, MD;  Location: [facility];  Service: Orthopedics;  Laterality: Left;  Left total knee arthroplasty  . TUBAL LIGATION    . VAGINAL HYSTERECTOMY  1987     Current Meds  Medication Sig  . acetaminophen (TYLENOL) 500 MG tablet Take 1,000 mg by mouth 3 (three) times daily as needed for pain.  . BD PEN NEEDLE NANO U/F 32G X 4 MM MISC USE AS DIRECTED WITH INSULIN PENS  . Cholecalciferol (VITAMIN D) 2000 UNITS CAPS Take 2,000 Units by mouth daily.  . digoxin (LANOXIN) 0.125 MG tablet Take 1 tablet (0.125 mg total) by mouth daily.  Marland Kitchen ELIQUIS 5 MG TABS tablet TAKE 1 TABLET BY ORAL ROUTE 2 TIMES EVERY DAY  . enalapril (VASOTEC) 10 MG tablet Take 5 mg by mouth at bedtime.  . fluocinonide cream (LIDEX) 0.05 % Apply topically as needed (as directed for dry skin).   . fluticasone (FLONASE) 50 MCG/ACT nasal spray Place 1 spray into both nostrils daily.  . furosemide (LASIX) 40 MG tablet Take 1 tablet (40 mg total) by mouth daily as needed for edema. For fluid overload.  Marland Kitchen KLOR-CON M20 20 MEQ tablet TAKE 1 TABLET (20 MEQ TOTAL) BY MOUTH DAILY. WHEN TAKING FUROSEMIDE (LASIX).  . Liraglutide (VICTOZA) 18 MG/3ML SOLN Inject 1.2 mg into the skin daily after supper. 4pm  . metoprolol succinate (TOPROL-XL) 50 MG 24 hr tablet Take 1 tablet (50 mg total) by mouth daily. Take with or immediately following a meal.  . Multiple Vitamin (MULITIVITAMIN WITH MINERALS) TABS Take 1 tablet by mouth daily. Centrum Silver  . mupirocin ointment (BACTROBAN) 2 % Apply 1 application topically daily.   [name] VERIO test strip USE AS DIRECTED TO CHECK BLOOD SUGARS 1 TIME PER DAY DX: E11.22  . Pitavastatin Calcium (LIVALO) 2 MG TABS Take by mouth. Pt take 1 tablet M-W-F  . vitamin C (ASCORBIC ACID) 500 MG tablet Take 500 mg by mouth daily.     Allergies:   Codeine and Warfarin and related   Social  History   Tobacco Use  . Smoking status: Current Every Day Smoker    Packs/day: 0.25    Years: 41.00    Pack years: 10.25    Types: Cigarettes  . Smokeless tobacco: Never Used  . Tobacco comment: smoked 1.5ppd x 9, 1ppdx40, cut back to 1/2ppd past 5 years  Substance Use Topics  . Alcohol use: No  . Drug use: No     Family Hx: The patient's family history includes Colon cancer in an other family member; Heart attack in her son; Heart disease in an other family member; Hypertension in her sister and son; Prostate cancer in her father; Stroke in her maternal grandfather, mother, and paternal grandmother.  ROS:   Please see the history of present illness.    Review of Systems  Constitutional: Negative.   Eyes: Negative for blurred vision.  Respiratory: Negative.   Cardiovascular: Negative.   Gastrointestinal: Negative.   Neurological:  Negative.   Psychiatric/Behavioral: Negative.     All other systems reviewed and are negative.   Labs/Other Tests and Data Reviewed:    Recent Labs: 04/21/2018: Hemoglobin 13.8; Magnesium 2.4; Platelets 228 08/03/2018: ALT 20 12/01/2018: BNP 75.1; BUN 24; Creatinine, Ser 1.53; Potassium 4.1; Sodium 141; TSH 1.050   Recent Lipid Panel Lab Results  Component Value Date/Time   CHOL 151 08/03/2018 02:43 PM   TRIG 128 08/03/2018 02:43 PM   HDL 39 (L) 08/03/2018 02:43 PM   CHOLHDL 3.9 08/03/2018 02:43 PM   CHOLHDL 4.8 06/27/2009 03:28 AM   LDLCALC 86 08/03/2018 02:43 PM    Wt Readings from Last 3 Encounters:  11/30/18 231 lb (104.8 kg)  10/29/18 233 lb (105.7 kg)  09/30/18 234 lb 8 oz (106.4 kg)     Exam:    Vital Signs:  BP (!) 102/58 (BP Location: Left Arm, Patient Position: Sitting, Cuff Size: Normal) Comment: pt provided  Pulse 96 Comment: pt provided  Temp (!) 97.1 F (36.2 C) (Oral) Comment (Src): pt provided  Ht '5\' 7"'  (1.702 m)   Wt 231 lb (104.8 kg) Comment: pt provided  BMI 36.18 kg/m     Physical Exam  Constitutional:  She is oriented to person, place, and time and well-developed, well-nourished, and in no distress.  HENT:  Head: Normocephalic and atraumatic.  Neck: Normal range of motion.  Pulmonary/Chest: Effort normal.  Neurological: She is alert and oriented to person, place, and time.  Psychiatric: Affect normal.  Nursing note and vitals reviewed.   ASSESSMENT & PLAN:     1. Type 2 diabetes mellitus with stage 3 chronic kidney disease, with long-term current use of insulin ([place])  She agrees to come in for labwork. I will also refer her for diabetic eye exam. She is encouraged to perform chair exercises four days per week.   - Hemoglobin A1c; Future - TSH; Future - Ambulatory referral to Ophthalmology - BMP8+EGFR; Future  2. Hypertensive heart and renal disease with renal failure, stage 1 through stage 4 or unspecified chronic kidney disease, with heart failure (HCC)  Chronic, yet stable. She will continue with current meds. She is encouraged to avoid adding salt to her foods.   3. Paroxysmal atrial fibrillation (HCC)  Chronic. She reports compliance with anticoagulation and other meds. She denies having any palpitations at this time.   4. NICM (nonischemic cardiomyopathy) (HCC)  Chronic. I will check BNP.   - Brain natriuretic peptide (38333); Future  5. Class 2 severe obesity due to excess calories with serious comorbidity and body mass index (BMI) of 36.0 to 36.9 in adult [facility])  Importance of achieving optimal weight to decrease risk of cardiovascular disease and cancers was discussed with the patient in full detail. She is encouraged to start slowly - start with 10 minutes twice daily at least three to four days per week and to gradually build to 30 minutes five days weekly. She was given tips to incorporate more activity into her daily routine - take stairs when possible, park farther away from grocery stores, etc.    6. Drug therapy  - Digoxin; Future  7. Need for vaccination   Rx for Prevnar-13 was sent to the pharmacy.     COVID-19 Education: The signs and symptoms of COVID-19 were discussed with the patient and how to seek care for testing (follow up with PCP or arrange E-visit).  The importance of social distancing was discussed today.  Patient Risk:   After full review  of this patients clinical status, I feel that they are at least moderate risk at this time.  Time:   Today, I have spent 23 minutes/ 20 seconds with the patient with telehealth technology discussing above diagnoses.     Medication Adjustments/Labs and Tests Ordered: Current medicines are reviewed at length with the patient today.  Concerns regarding medicines are outlined above.   Tests Ordered: Orders Placed This Encounter  Procedures  . Hemoglobin A1c  . TSH  . Brain natriuretic peptide (84784)  . BMP8+EGFR  . Digoxin  . Ambulatory referral to Ophthalmology    Medication Changes: Meds ordered this encounter  Medications  . pneumococcal 13-valent conjugate vaccine (PREVNAR 13) SUSP injection    Sig: Inject 0.5 mLs into the muscle tomorrow at 10 am for 1 dose.    Dispense:  0.5 mL    Refill:  0    Disposition:  Follow up in 4 month(s)  Signed, Maximino Greenland, MD

## 2018-12-10 ENCOUNTER — Ambulatory Visit: Payer: Medicare Other

## 2018-12-22 ENCOUNTER — Other Ambulatory Visit: Payer: Self-pay | Admitting: Internal Medicine

## 2018-12-23 ENCOUNTER — Encounter: Payer: Self-pay | Admitting: Radiology

## 2018-12-28 ENCOUNTER — Ambulatory Visit: Payer: Medicare Other

## 2019-01-08 ENCOUNTER — Ambulatory Visit
Admission: RE | Admit: 2019-01-08 | Discharge: 2019-01-08 | Disposition: A | Discharge status: Home / Self Care | Payer: Medicare Other | Source: Ambulatory Visit | Attending: Internal Medicine | Admitting: Internal Medicine

## 2019-01-08 ENCOUNTER — Other Ambulatory Visit: Payer: Self-pay

## 2019-01-08 ENCOUNTER — Ambulatory Visit: Payer: Medicare Other

## 2019-01-08 DIAGNOSIS — Z122 Encounter for screening for malignant neoplasm of respiratory organs: Secondary | ICD-10-CM

## 2019-01-08 DIAGNOSIS — Z87891 Personal history of nicotine dependence: Secondary | ICD-10-CM | POA: Diagnosis not present

## 2019-01-11 ENCOUNTER — Other Ambulatory Visit: Payer: Self-pay

## 2019-01-11 DIAGNOSIS — E049 Nontoxic goiter, unspecified: Secondary | ICD-10-CM

## 2019-01-12 ENCOUNTER — Other Ambulatory Visit: Payer: Self-pay | Admitting: Internal Medicine

## 2019-01-12 DIAGNOSIS — E049 Nontoxic goiter, unspecified: Secondary | ICD-10-CM

## 2019-01-13 ENCOUNTER — Encounter: Payer: Medicare Other | Admitting: Internal Medicine

## 2019-01-13 ENCOUNTER — Ambulatory Visit: Payer: Medicare Other

## 2019-01-17 ENCOUNTER — Other Ambulatory Visit: Payer: Self-pay | Admitting: Internal Medicine

## 2019-01-18 ENCOUNTER — Other Ambulatory Visit: Payer: Self-pay | Admitting: Internal Medicine

## 2019-01-18 ENCOUNTER — Other Ambulatory Visit: Payer: Self-pay | Admitting: Interventional Cardiology

## 2019-01-20 ENCOUNTER — Other Ambulatory Visit: Payer: Self-pay | Admitting: Internal Medicine

## 2019-01-20 DIAGNOSIS — E049 Nontoxic goiter, unspecified: Secondary | ICD-10-CM

## 2019-01-21 ENCOUNTER — Other Ambulatory Visit: Payer: Medicare Other

## 2019-01-25 ENCOUNTER — Other Ambulatory Visit: Payer: Medicare Other

## 2019-01-27 ENCOUNTER — Ambulatory Visit: Payer: Medicare Other

## 2019-01-27 ENCOUNTER — Encounter: Payer: Medicare Other | Admitting: Internal Medicine

## 2019-01-28 ENCOUNTER — Ambulatory Visit
Admission: RE | Admit: 2019-01-28 | Discharge: 2019-01-28 | Disposition: A | Discharge status: Home / Self Care | Payer: Medicare Other | Source: Ambulatory Visit | Attending: Internal Medicine | Admitting: Internal Medicine

## 2019-01-28 DIAGNOSIS — E041 Nontoxic single thyroid nodule: Secondary | ICD-10-CM | POA: Diagnosis not present

## 2019-01-28 DIAGNOSIS — E049 Nontoxic goiter, unspecified: Secondary | ICD-10-CM

## 2019-02-04 ENCOUNTER — Ambulatory Visit (INDEPENDENT_AMBULATORY_CARE_PROVIDER_SITE_OTHER): Payer: Medicare Other | Admitting: Internal Medicine

## 2019-02-04 ENCOUNTER — Encounter: Payer: Self-pay | Admitting: Internal Medicine

## 2019-02-04 ENCOUNTER — Other Ambulatory Visit: Payer: Self-pay

## 2019-02-04 VITALS — BP 119/68 | HR 94 | Wt 229.0 lb

## 2019-02-04 DIAGNOSIS — Z720 Tobacco use: Secondary | ICD-10-CM | POA: Diagnosis not present

## 2019-02-04 DIAGNOSIS — J069 Acute upper respiratory infection, unspecified: Secondary | ICD-10-CM | POA: Diagnosis not present

## 2019-02-04 DIAGNOSIS — Z6835 Body mass index (BMI) 35.0-35.9, adult: Secondary | ICD-10-CM

## 2019-02-04 DIAGNOSIS — J438 Other emphysema: Secondary | ICD-10-CM

## 2019-02-04 MED ORDER — AMOXICILLIN-POT CLAVULANATE 875-125 MG PO TABS: 1.0000 | ORAL_TABLET | Freq: Two times a day (BID) | ORAL | 0 refills | Status: AC

## 2019-02-04 NOTE — Patient Instructions (Signed)
Acute Bronchitis, Adult Acute bronchitis is when air tubes (bronchi) in the lungs suddenly get swollen. The condition can make it hard to breathe. It can also cause these symptoms:  A cough.  Coughing up clear, yellow, or green mucus.  Wheezing.  Chest congestion.  Shortness of breath.  A fever.  Body aches.  Chills.  A sore throat. Follow these instructions at home:  Medicines  Take over-the-counter and prescription medicines only as told by your doctor.  If you were prescribed an antibiotic medicine, take it as told by your doctor. Do not stop taking the antibiotic even if you start to feel better. General instructions  Rest.  Drink enough fluids to keep your pee (urine) pale yellow.  Avoid smoking and secondhand smoke. If you smoke and you need help quitting, ask your doctor. Quitting will help your lungs heal faster.  Use an inhaler, cool mist vaporizer, or humidifier as told by your doctor.  Keep all follow-up visits as told by your doctor. This is important. How is this prevented? To lower your risk of getting this condition again:  Wash your hands often with soap and water. If you cannot use soap and water, use hand sanitizer.  Avoid contact with people who have cold symptoms.  Try not to touch your hands to your mouth, nose, or eyes.  Make sure to get the flu shot every year. Contact a doctor if:  Your symptoms do not get better in 2 weeks. Get help right away if:  You cough up blood.  You have chest pain.  You have very bad shortness of breath.  You become dehydrated.  You faint (pass out) or keep feeling like you are going to pass out.  You keep throwing up (vomiting).  You have a very bad headache.  Your fever or chills gets worse. This information is not intended to replace advice given to you by your health care provider. Make sure you discuss any questions you have with your health care provider. Document Released: 11/06/2007 Document  Revised: 05/02/2017 Document Reviewed: 11/08/2015 Elsevier Patient Education  2020 Elsevier Inc[employer]  

## 2019-02-04 NOTE — Progress Notes (Signed)
Virtual Visit via Video   This visit type was conducted due to national recommendations for restrictions regarding the COVID-19 Pandemic (e.g. social distancing) in an effort to limit this patient's exposure and mitigate transmission in our community.  Due to her co-morbid illnesses, this patient is at least at moderate risk for complications without adequate follow up.  This format is felt to be most appropriate for this patient at this time.  All issues noted in this document were discussed and addressed.  A limited physical exam was performed with this format.    This visit type was conducted due to national recommendations for restrictions regarding the COVID-19 Pandemic (e.g. social distancing) in an effort to limit this patient's exposure and mitigate transmission in our community.  Patients identity confirmed using two different identifiers.  This format is felt to be most appropriate for this patient at this time.  All issues noted in this document were discussed and addressed.  No physical exam was performed (except for noted visual exam findings with Video Visits).    Date:  02/04/2019   ID:  [name], DOB Oct 28, 1945, MRN [id number]  Patient Location:  Home  Provider location:   Office    Chief Complaint:  "I have a cough"  History of Present Illness:    [name] is a 73 y.o. female who presents via video conferencing for a telehealth visit today.    The patient does have symptoms concerning for COVID-19 infection (fever, chills, cough, or new shortness of breath).   She presents today for virtual visit. She prefers this method of contact due to COVID-19 pandemic.  She presents today for further evaluation of cough/congestion. She reports her symptoms started about 1-2 weeks ago.  She has cough productive of discolored sputum. She does not think she has a fever. She denies chills. She has tried some otc meds without any relief of her symptoms. She is a smoker.      Past Medical History:  Diagnosis Date   Arthritis    "left knee" (03/12/2013)   Atrial fibrillation (HCC)    Cardioverted to NSR 08/22/2009   Cardiomyopathy    CHF (congestive heart failure) (HCC)    CKD (chronic kidney disease)    Depression    Dizziness    DM2 (diabetes mellitus, type 2) (HCC)    fasting 80-100 (03/12/2013)   Drug therapy 1/11   coumadin   Ejection fraction < 50%    20-25%, echo.06/2009, /  EF 30-35%, diffuse hypokinesis, echo, Oct 16, 2010   GERD (gastroesophageal reflux disease)    Gout    "left knee" (03/12/2013)   History of bronchitis    History of emphysema    HLD (hyperlipidemia)    Hyperkalemia    Hypertension    LFT elevation    06/2009   Mitral regurgitation    mild - echo - 1/11 /  Mild, echo, May15, 2012   Overweight(278.02)    Pneumonia    "@ least twice" (03/12/2013)   Thyroid dysfunction    thyromegally,diffuse,nodule LLL,06/2009   Tobacco abuse    Tricuspid regurgitation    moderate - echo - 1/11   Past Surgical History:  Procedure Laterality Date   BREAST BIOPSY Right 1980's   BREAST LUMPECTOMY Right 1980's   CATARACT EXTRACTION W/ INTRAOCULAR LENS  IMPLANT, BILATERAL Bilateral 2010-2012   CHOLECYSTECTOMY  ~ 1978   JOINT REPLACEMENT     TONSILLECTOMY  1960's   TOTAL KNEE ARTHROPLASTY Left 03/12/2013  TOTAL KNEE ARTHROPLASTY Left 03/12/2013   Procedure: TOTAL KNEE ARTHROPLASTY;  Surgeon: Newt Minion, MD;  Location: [place] [facility];  Service: Orthopedics;  Laterality: Left;  Left total knee arthroplasty   TUBAL LIGATION     VAGINAL HYSTERECTOMY  1987     Current Meds  Medication Sig   acetaminophen (TYLENOL) 500 MG tablet Take 1,000 mg by mouth 3 (three) times daily as needed for pain.   Cholecalciferol (VITAMIN D) 2000 UNITS CAPS Take 2,000 Units by mouth daily.   digoxin (LANOXIN) 0.125 MG tablet Take 1 tablet (0.125 mg total) by mouth daily.   ELIQUIS 5 MG TABS tablet TAKE 1 TABLET BY MOUTH  TWICE A DAY   enalapril (VASOTEC) 5 MG tablet Take 5 mg by mouth at bedtime.    fluocinonide cream (LIDEX) 0.05 % Apply topically as needed (as directed for dry skin).    fluticasone (FLONASE) 50 MCG/ACT nasal spray Place 1 spray into both nostrils daily.   furosemide (LASIX) 40 MG tablet TAKE 1 TABLET BY MOUITH DAILY AS NEEDED FOR EDEMA FOR FLUID OVERLOAD   KLOR-CON M20 20 MEQ tablet TAKE 1 TABLET (20 MEQ TOTAL) BY MOUTH DAILY. WHEN TAKING FUROSEMIDE (LASIX).   Liraglutide (VICTOZA) 18 MG/3ML SOLN Inject 1.2 mg into the skin daily after supper. 4pm   metoprolol succinate (TOPROL-XL) 50 MG 24 hr tablet Take 1 tablet (50 mg total) by mouth daily. Take with or immediately following a meal.   Multiple Vitamin (MULITIVITAMIN WITH MINERALS) TABS Take 1 tablet by mouth daily. Centrum Silver   mupirocin ointment (BACTROBAN) 2 % Apply 1 application topically daily.    ONETOUCH VERIO test strip USE AS DIRECTED TO CHECK BLOOD SUGARS 1 TIME PER DAY DX: E11.22   Pitavastatin Calcium (LIVALO) 2 MG TABS Take by mouth. Pt take 1 tablet M-W-F   vitamin C (ASCORBIC ACID) 500 MG tablet Take 500 mg by mouth daily.     Allergies:   Codeine and Warfarin and related   Social History   Tobacco Use   Smoking status: Current Every Day Smoker    Packs/day: 1.00    Years: 40.00    Pack years: 40.00    Types: Cigarettes   Smokeless tobacco: Never Used   Tobacco comment: smoked 1.5ppd x 9, 1ppdx40, cut back to 1/2ppd past 5 years  Substance Use Topics   Alcohol use: No   Drug use: No     Family Hx: The patient's family history includes Colon cancer in an other family member; Heart attack in her son; Heart disease in an other family member; Hypertension in her sister and son; Prostate cancer in her father; Stroke in her maternal grandfather, mother, and paternal grandmother.  ROS:   Please see the history of present illness.    Review of Systems  Constitutional: Negative.   HENT: Positive  for congestion.   Respiratory: Positive for cough.   Cardiovascular: Negative.   Gastrointestinal: Negative.   Neurological: Negative.   Psychiatric/Behavioral: Negative.     All other systems reviewed and are negative.   Labs/Other Tests and Data Reviewed:    Recent Labs: 04/21/2018: Hemoglobin 13.8; Magnesium 2.4; Platelets 228 08/03/2018: ALT 20 12/01/2018: BNP 75.1; BUN 24; Creatinine, Ser 1.53; Potassium 4.1; Sodium 141; TSH 1.050   Recent Lipid Panel Lab Results  Component Value Date/Time   CHOL 151 08/03/2018 02:43 PM   TRIG 128 08/03/2018 02:43 PM   HDL 39 (L) 08/03/2018 02:43 PM   CHOLHDL 3.9 08/03/2018 02:43 PM  CHOLHDL 4.8 06/27/2009 03:28 AM   LDLCALC 86 08/03/2018 02:43 PM    Wt Readings from Last 3 Encounters:  02/04/19 229 lb (103.9 kg)  11/30/18 231 lb (104.8 kg)  10/29/18 233 lb (105.7 kg)     Exam:    Vital Signs:  BP 119/68 (BP Location: Left Arm, Patient Position: Sitting, Cuff Size: Large)    Pulse 94    Wt 229 lb (103.9 kg)    SpO2 95%    BMI 35.87 kg/m     Physical Exam  Constitutional: She is oriented to person, place, and time and well-developed, well-nourished, and in no distress.  HENT:  Head: Normocephalic and atraumatic.  Pulmonary/Chest: Effort normal.  Able to speak in full sentences  Neurological: She is alert and oriented to person, place, and time.  Psychiatric: Affect normal.  Nursing note and vitals reviewed.   ASSESSMENT & PLAN:     1. Upper respiratory tract infection, unspecified type  I will send in a prescription for Augmentin 875mg  twice daily. She is encouraged to take full antibiotic course. She is also encouraged to get tested for COVID. She reports her [facility] will come out and test her when needed. She plans to contact her later today. She is also advised of drive-through testing at [facility] if needed. She promises to inform me of her results when available.   2. Other emphysema (HCC)  Chronic.  Because of this underlying condition, I think abx are appropriate for her.   3. Tobacco abuse  Importance of smoking cessation was discussed with the patient for greater than 3 minutes. She is not yet ready to quit. She reports she has already cut back considerably. She is smoking 1/4-1/2 ppd.   4. Class 2 severe obesity due to excess calories with serious comorbidity and body mass index (BMI) of 35.0 to 35.9 in adult [facility])  She is encouraged to strive for lose twenty pounds to decrease cardiac risk. She is encouraged to increase daily activity as tolerated once she is over current illness.      COVID-19 Education: The signs and symptoms of COVID-19 were discussed with the patient and how to seek care for testing (follow up with PCP or arrange E-visit).  The importance of social distancing was discussed today.  Patient Risk:   After full review of this patients clinical status, I feel that they are at least moderate risk at this time.   Medication Adjustments/Labs and Tests Ordered: Current medicines are reviewed at length with the patient today.  Concerns regarding medicines are outlined above.   Tests Ordered: No orders of the defined types were placed in this encounter.   Medication Changes: Meds ordered this encounter  Medications   amoxicillin-clavulanate (AUGMENTIN) 875-125 MG tablet    Sig: Take 1 tablet by mouth 2 (two) times daily for 7 days.    Dispense:  14 tablet    Refill:  0    Disposition:  Follow up prn  Signed, Maximino Greenland, MD

## 2019-02-11 ENCOUNTER — Other Ambulatory Visit: Payer: Self-pay | Admitting: Internal Medicine

## 2019-02-17 ENCOUNTER — Ambulatory Visit: Payer: Medicare Other

## 2019-02-17 ENCOUNTER — Other Ambulatory Visit: Payer: Self-pay

## 2019-02-17 ENCOUNTER — Ambulatory Visit: Payer: Medicare Other | Admitting: Internal Medicine

## 2019-02-17 ENCOUNTER — Telehealth (INDEPENDENT_AMBULATORY_CARE_PROVIDER_SITE_OTHER): Payer: Medicare Other | Admitting: Cardiovascular Disease

## 2019-02-17 VITALS — BP 116/66 | HR 66 | Temp 97.2°F | Ht 67.0 in | Wt 225.5 lb

## 2019-02-17 DIAGNOSIS — I10 Essential (primary) hypertension: Secondary | ICD-10-CM

## 2019-02-17 DIAGNOSIS — Z20822 Contact with and (suspected) exposure to covid-19: Secondary | ICD-10-CM

## 2019-02-17 DIAGNOSIS — I4819 Other persistent atrial fibrillation: Secondary | ICD-10-CM

## 2019-02-17 DIAGNOSIS — R6889 Other general symptoms and signs: Secondary | ICD-10-CM | POA: Diagnosis not present

## 2019-02-17 DIAGNOSIS — I5042 Chronic combined systolic (congestive) and diastolic (congestive) heart failure: Secondary | ICD-10-CM

## 2019-02-17 NOTE — Patient Instructions (Signed)
Medication Instructions:  Your physician recommends that you continue on your current medications as directed. Please refer to the Current Medication list given to you today.  If you need a refill on your cardiac medications before your next appointment, please call your pharmacy.   Lab work: NONE   Testing/Procedures: Your physician has requested that you have an echocardiogram. Echocardiography is a painless test that uses sound waves to create images of your heart. It provides your doctor with information about the size and shape of your heart and how well your heart's [place] and valves are working. This procedure takes approximately one hour. There are no restrictions for this procedure. [place] [address]  Follow-Up: At [facility], you and your health needs are our priority.  As part of our continuing mission to provide you with exceptional heart care, we have created designated Provider Care Teams.  These Care Teams include your primary Cardiologist (physician) and Advanced Practice Providers (APPs -  Physician Assistants and Nurse Practitioners) who all work together to provide you with the care you need, when you need it. You will need a follow up appointment in 4 months.  Please call our office 2 months in advance to schedule this appointment.  You may see [name], MD or one of the following Advanced Practice Providers on your designated Care Team:   Kerin Ransom, PA-C Roby Lofts, [place] . Sande Rives, PA-C  Any Other Special Instructions Will Be Listed Below (If Applicable).   Echocardiogram An echocardiogram is a procedure that uses painless sound waves (ultrasound) to produce an image of the heart. Images from an echocardiogram can provide important information about:  Signs of coronary artery disease (CAD).  Aneurysm detection. An aneurysm is a weak or damaged part of an artery wall that bulges out from the normal force of blood  pumping through the body.  Heart size and shape. Changes in the size or shape of the heart can be associated with certain conditions, including heart failure, aneurysm, and CAD.  Heart muscle function.  Heart valve function.  Signs of a past heart attack.  Fluid buildup around the heart.  Thickening of the heart muscle.  A tumor or infectious growth around the heart valves. Tell a health care provider about:  Any allergies you have.  All medicines you are taking, including vitamins, herbs, eye drops, creams, and over-the-counter medicines.  Any blood disorders you have.  Any surgeries you have had.  Any medical conditions you have.  Whether you are pregnant or may be pregnant. What are the risks? Generally, this is a safe procedure. However, problems may occur, including:  Allergic reaction to dye (contrast) that may be used during the procedure. What happens before the procedure? No specific preparation is needed. You may eat and drink normally. What happens during the procedure?   An IV tube may be inserted into one of your veins.  You may receive contrast through this tube. A contrast is an injection that improves the quality of the pictures from your heart.  A gel will be applied to your chest.  A wand-like tool (transducer) will be moved over your chest. The gel will help to transmit the sound waves from the transducer.  The sound waves will harmlessly bounce off of your heart to allow the heart images to be captured in real-time motion. The images will be recorded on a computer. The procedure may vary among health care providers and hospitals. What happens  after the procedure?  You may return to your normal, everyday life, including diet, activities, and medicines, unless your health care provider tells you not to do that. Summary  An echocardiogram is a procedure that uses painless sound waves (ultrasound) to produce an image of the heart.  Images from an  echocardiogram can provide important information about the size and shape of your heart, heart muscle function, heart valve function, and fluid buildup around your heart.  You do not need to do anything to prepare before this procedure. You may eat and drink normally.  After the echocardiogram is completed, you may return to your normal, everyday life, unless your health care provider tells you not to do that. This information is not intended to replace advice given to you by your health care provider. Make sure you discuss any questions you have with your health care provider. Document Released: 05/17/2000 Document Revised: 09/10/2018 Document Reviewed: 06/22/2016 Elsevier Patient Education  2020 [employer].

## 2019-02-17 NOTE — Progress Notes (Signed)
Virtual Visit via Video Note   This visit type was conducted due to national recommendations for restrictions regarding the COVID-19 Pandemic (e.g. social distancing) in an effort to limit this patient's exposure and mitigate transmission in our community.  Due to her co-morbid illnesses, this patient is at least at moderate risk for complications without adequate follow up.  This format is felt to be most appropriate for this patient at this time.  All issues noted in this document were discussed and addressed.  A limited physical exam was performed with this format.  Please refer to the patient's chart for her consent to telehealth for [facility].   Evaluation Performed:  Follow-up visit  Date:  02/17/2019   ID:  [name], DOB [birth year], MRN [id number]  Patient Location: Home Provider Location: Office  PCP:  [name], MD  Cardiologist:  [name], MD  Electrophysiologist:  None   Chief Complaint:  Heart failure  History of Present Illness:    [name] is a 73 y.o. female with chronic systolic and diastolic heart failure (LVEF 25 to 30%, nonischemic), PAF, diabetes, hyperlipidemia, hypertension, and tobacco abuse here for follow up.  She was previously a patient of Dr. Ron Parker and then Dr. Irish Lack.  She was first diagnosed with heart failure 06/2009.  At that time her LVEF was 25%.  This was associated with atrial fibrillation.  She reports having an ablation with Dr. [name] in 1999, though those records are not available at this time.  She has undergone cardioversions for atrial fibrillation with Dr. Ron Parker.  LVEF has ranged from 25 to 35% but has not improved despite beta-blockers and ACE inhibitor.  She had an echo 11/2011 that revealed LVEF 50 to 55%.  However subsequent studies have ranged from 20-35%.  LVEF on Lexiscan Myoview 09/2014 was 41%.  She developed a left bundle branch block pattern in 2014.  Her cardiomyopathy is nonischemic.  She last saw Dr.  Irish Lack 04/2018 and carvedilol was increased due to concern for atrial tachycardia. She notes that she feels more fatigued since making this change.  She notes that since increasing carvedilol she sometimes has a dull headache and she feels shaky.  Her ACE inhibitor was reduced 2/2 CKD III.  Prior to that she called the office 12/2017 with volume overload.  She struggles with limiting salt though she notes that she retains fluid when she has salty foods.  She weighs herself daily and takes lasix as needed, which is usually twice per week.  She doesn't get much formal exercise but walks around the house several times per day.  She has no exertional symptoms.  She denies chest pain, shortness of breath, lower extremity edema, orthopnea or PND>  Her BP has been labile, but last week it was running low in the 80-90s/50-60s.  Her heart rate is typically high.  She previously discussed ICD with Dr. Irish Lack but was not interested.   Carvedilol was switched to metoprolol due to hypotension and tachycardia.  Her heart rate remained elevated but her energy levels improved.  At her last appointment digoxin was started due to poor heart rate control and heart failure.  She was considering CRD-D after coronavirus.  Since starting digoxin her heart rate has been much better.  Her heart rate has come down from 90s-100s to the 60s.  She feels much better and stronger.  She felt very weak on carvedilol.  She denies any chest pain or shortness of breath.  She denies  lower extremity edema, orthopnea or PND.  Her weight has been decreasing.  She attributes this to Victoza curbing her appetite.She also tries to walk more.  She had digoxin levels and BNP checked 11/2018 and both were within normal limits. She was recently treated for a URI with amoxicillin.  She is doing better from a respiratory standpoint but she developed diarrhea and a yeast infection.  Otherwise she has been feeling well.   The patient does not have symptoms  concerning for COVID-19 infection (fever, chills, cough, or new shortness of breath).    Past Medical History:  Diagnosis Date  . Arthritis    "left knee" (03/12/2013)  . Atrial fibrillation ([place])    Cardioverted to NSR 08/22/2009  . Cardiomyopathy   . CHF (congestive heart failure) ([place])   . CKD (chronic kidney disease)   . Depression   . Dizziness   . DM2 (diabetes mellitus, type 2) ([place])    fasting 80-100 (03/12/2013)  . Drug therapy 1/11   coumadin  . Ejection fraction < 50%    20-25%, echo.06/2009, /  EF 30-35%, diffuse hypokinesis, echo, Oct 16, 2010  . GERD (gastroesophageal reflux disease)   . Gout    "left knee" (03/12/2013)  . History of bronchitis   . History of emphysema   . HLD (hyperlipidemia)   . Hyperkalemia   . Hypertension   . LFT elevation    06/2009  . Mitral regurgitation    mild - echo - 1/11 /  Mild, echo, May15, 2012  . Overweight(278.02)   . Pneumonia    "@ least twice" (03/12/2013)  . Thyroid dysfunction    thyromegally,diffuse,nodule LLL,06/2009  . Tobacco abuse   . Tricuspid regurgitation    moderate - echo - 1/11   Past Surgical History:  Procedure Laterality Date  . BREAST BIOPSY Right 1980's  . BREAST LUMPECTOMY Right 1980's  . CATARACT EXTRACTION W/ INTRAOCULAR LENS  IMPLANT, BILATERAL Bilateral 2010-2012  . CHOLECYSTECTOMY  ~ 1978  . JOINT REPLACEMENT    . TONSILLECTOMY  1960's  . TOTAL KNEE ARTHROPLASTY Left 03/12/2013  . TOTAL KNEE ARTHROPLASTY Left 03/12/2013   Procedure: TOTAL KNEE ARTHROPLASTY;  Surgeon: Newt Minion, MD;  Location: [place];  Service: Orthopedics;  Laterality: Left;  Left total knee arthroplasty  . TUBAL LIGATION    . VAGINAL HYSTERECTOMY  1987     Current Meds  Medication Sig  . acetaminophen (TYLENOL) 500 MG tablet Take 1,000 mg by mouth 3 (three) times daily as needed for pain.  . BD PEN NEEDLE NANO U/F 32G X 4 MM MISC USE AS DIRECTED WITH INSULIN PENS  . Cholecalciferol (VITAMIN D) 2000 UNITS CAPS Take  2,000 Units by mouth daily.  . colchicine 0.6 MG tablet Take 0.6 mg by mouth 2 (two) times daily as needed (GOUT).   Marland Kitchen digoxin (LANOXIN) 0.125 MG tablet Take 1 tablet (0.125 mg total) by mouth daily.  Marland Kitchen ELIQUIS 5 MG TABS tablet TAKE 1 TABLET BY MOUTH TWICE A DAY  . enalapril (VASOTEC) 5 MG tablet Take 5 mg by mouth at bedtime.   . fluocinonide cream (LIDEX) 0.05 % Apply topically as needed (as directed for dry skin).   . fluticasone (FLONASE) 50 MCG/ACT nasal spray Place 1 spray into both nostrils daily.  . furosemide (LASIX) 40 MG tablet TAKE 1 TABLET BY MOUITH DAILY AS NEEDED FOR EDEMA FOR FLUID OVERLOAD  . Liraglutide (VICTOZA) 18 MG/3ML SOLN Inject 1.2 mg into the skin daily after supper.  4pm  . metoprolol succinate (TOPROL-XL) 50 MG 24 hr tablet Take 1 tablet (50 mg total) by mouth daily. Take with or immediately following a meal.  . Multiple Vitamin (MULITIVITAMIN WITH MINERALS) TABS Take 1 tablet by mouth daily. Centrum Silver  . mupirocin ointment (BACTROBAN) 2 % Apply 1 application topically daily.   [name] VERIO test strip USE AS DIRECTED TO CHECK BLOOD SUGARS 1 TIME PER DAY DX: E11.22  . pantoprazole (PROTONIX) 20 MG tablet Take 20 mg by mouth 2 (two) times daily.  . Pitavastatin Calcium (LIVALO) 2 MG TABS Take by mouth. Pt take 1 tablet M-W-F  . vitamin C (ASCORBIC ACID) 500 MG tablet Take 500 mg by mouth daily.     Allergies:   Codeine, Augmentin [amoxicillin-pot clavulanate], and Warfarin and related   Social History   Tobacco Use  . Smoking status: Current Every Day Smoker    Packs/day: 1.00    Years: 40.00    Pack years: 40.00    Types: Cigarettes  . Smokeless tobacco: Never Used  . Tobacco comment: smoked 1.5ppd x 9, 1ppdx40, cut back to 1/2ppd past 5 years  Substance Use Topics  . Alcohol use: No  . Drug use: No     Family Hx: The patient's family history includes Colon cancer in an other family member; Heart attack in her son; Heart disease in an other  family member; Hypertension in her sister and son; Prostate cancer in her father; Stroke in her maternal grandfather, mother, and paternal grandmother.  ROS:   Please see the history of present illness.     All other systems reviewed and are negative.   Prior CV studies:   The following studies were reviewed today:  Echo 07/28/15: Study Conclusions  - Left ventricle: The cavity size was normal. There was moderate   concentric hypertrophy. Systolic function was severely reduced.   The estimated ejection fraction was in the range of 25% to 30%.   Diffuse hypokinesis. Features are consistent with a pseudonormal   left ventricular filling pattern, with concomitant abnormal   relaxation and increased filling pressure (grade 2 diastolic   dysfunction). Doppler parameters are consistent with high   ventricular filling pressure. - Aortic valve: Transvalvular velocity was within the normal range.   There was no stenosis. There was no regurgitation. - Mitral valve: Calcified annulus. Mildly thickened leaflets .   There was trivial regurgitation. - Left atrium: The atrium was mildly dilated. - Right ventricle: The cavity size was normal. Wall thickness was   normal. Systolic function was normal. - Atrial septum: A patent foramen ovale cannot be excluded. - Tricuspid valve: There was mild regurgitation. Peak RV-RA   gradient (S): 30 mm Hg. - Inferior vena cava: The vessel was normal in size. The   respirophasic diameter changes were in the normal range (= 50%),   consistent with normal central venous pressure.  Lexiscan Myoview 09/08/14: LVEF 41%.  No ischemia.   Labs/Other Tests and Data Reviewed:    EKG:  An ECG dated 04/22/19 was personally reviewed today and demonstrated:  Ectopic atrial rhythm.  Rate 95 bpm.  PVCs.  IVCD.  QRS 130 ms.   Recent Labs: 04/21/2018: Hemoglobin 13.8; Magnesium 2.4; Platelets 228 08/03/2018: ALT 20 12/01/2018: BNP 75.1; BUN 24; Creatinine, Ser 1.53;  Potassium 4.1; Sodium 141; TSH 1.050   Recent Lipid Panel Lab Results  Component Value Date/Time   CHOL 151 08/03/2018 02:43 PM   TRIG 128 08/03/2018 02:43 PM  HDL 39 (L) 08/03/2018 02:43 PM   CHOLHDL 3.9 08/03/2018 02:43 PM   CHOLHDL 4.8 06/27/2009 03:28 AM   LDLCALC 86 08/03/2018 02:43 PM    Wt Readings from Last 3 Encounters:  02/17/19 225 lb 8 oz (102.3 kg)  02/04/19 229 lb (103.9 kg)  11/30/18 231 lb (104.8 kg)     Objective:    BP 116/66   Pulse 66   Temp (!) 97.2 F (36.2 C)   Ht 5\' 7"  (1.702 m)   Wt 225 lb 8 oz (102.3 kg)   SpO2 97%   BMI 35.32 kg/m  GENERAL: Well-appearing.  No acute distress. HEENT: Pupils equal round.  Oral mucosa unremarkable NECK:  No jugular venous distention, no visible thyromegaly EXT:  No edema, no cyanosis no clubbing SKIN:  No rashes no nodules NEURO:  Speech fluent.  Cranial nerves grossly intact.  Moves all 4 extremities freely PSYCH:  Cognitively intact, oriented to person place and time   ASSESSMENT & PLAN:    # Chronic systolic and diastolic heart failure: NICM.  Most recent echo in 2017 revealed LVEF 20-25%. LVEF was >35% on nuclear imaging.  In the past she has been uninterested in an ICD.  Given LBBB would consider CRT-P/D.  BNP was wnl 11/2018 and she is doing well symptomatically.  We will repeat her echo for LVEF assessment.  She is now willing to get a CRT-D if needed.  Continue metoprolol succinate, digoxin, and enalapril.  Consider switch to [facility] and adding spironolactone dependent upon echo results.  # Asymptomatic CAD: Noted on chest CT.  Continue pitavastatin. LDL goal <86.  # Hyperlipidemia: Ms. Accardi is on pitavastatin 2mg  daily.  LDL was 86 on 08/2018.  Goal is <70 given DM.    # Persistent atrial fibrillation: Continue metoprolol, digoxin and Eliquis.  # Tobacco abuse: Down to 7 cigarettes per day.  She is working with Dr. Baird Cancer to quit.    COVID-19 Education: The signs and symptoms of COVID-19  were discussed with the patient and how to seek care for testing (follow up with PCP or arrange E-visit).  The importance of social distancing was discussed today.  Time:   Today, I have spent 22 minutes with the patient with telehealth technology discussing the above problems.     Medication Adjustments/Labs and Tests Ordered: Current medicines are reviewed at length with the patient today.  Concerns regarding medicines are outlined above.   Tests Ordered: Orders Placed This Encounter  Procedures  . ECHOCARDIOGRAM COMPLETE    Medication Changes: No orders of the defined types were placed in this encounter.   Disposition:  Follow up in 4 month(s)  Signed, [name], MD  02/17/2019 10:37 AM    [place]

## 2019-02-18 ENCOUNTER — Telehealth: Payer: Self-pay

## 2019-02-18 LAB — NOVEL CORONAVIRUS, NAA: SARS-CoV-2, NAA: NOT DETECTED

## 2019-02-18 NOTE — Telephone Encounter (Signed)
I called patient and left her a v/m to call the office. Dr.Sanders wanted me to ask her if she is willing to take a cholesterol medication? Due to her having HTN and Diabetes this will help decrease her risk for any cardiac diseases. YRL,RMA

## 2019-02-23 ENCOUNTER — Other Ambulatory Visit: Payer: Self-pay

## 2019-02-23 ENCOUNTER — Ambulatory Visit (HOSPITAL_COMMUNITY): Payer: Medicare Other | Attending: Cardiology

## 2019-02-23 DIAGNOSIS — I5042 Chronic combined systolic (congestive) and diastolic (congestive) heart failure: Secondary | ICD-10-CM | POA: Diagnosis not present

## 2019-02-23 MED ORDER — PERFLUTREN LIPID MICROSPHERE
1.0000 mL | INTRAVENOUS | Status: AC | PRN
  Administered 2019-02-23: 1 mL via INTRAVENOUS

## 2019-02-25 ENCOUNTER — Encounter: Payer: Medicare Other | Admitting: Internal Medicine

## 2019-02-25 ENCOUNTER — Encounter: Payer: Self-pay | Admitting: Internal Medicine

## 2019-02-25 ENCOUNTER — Ambulatory Visit (INDEPENDENT_AMBULATORY_CARE_PROVIDER_SITE_OTHER): Payer: Medicare Other | Admitting: Internal Medicine

## 2019-02-25 ENCOUNTER — Ambulatory Visit (INDEPENDENT_AMBULATORY_CARE_PROVIDER_SITE_OTHER): Payer: Medicare Other

## 2019-02-25 ENCOUNTER — Other Ambulatory Visit: Payer: Self-pay

## 2019-02-25 VITALS — BP 118/76 | HR 98 | Temp 97.6°F | Ht 66.4 in | Wt 224.8 lb

## 2019-02-25 DIAGNOSIS — Z6835 Body mass index (BMI) 35.0-35.9, adult: Secondary | ICD-10-CM

## 2019-02-25 DIAGNOSIS — Z23 Encounter for immunization: Secondary | ICD-10-CM | POA: Diagnosis not present

## 2019-02-25 DIAGNOSIS — N183 Chronic kidney disease, stage 3 unspecified: Secondary | ICD-10-CM

## 2019-02-25 DIAGNOSIS — E041 Nontoxic single thyroid nodule: Secondary | ICD-10-CM | POA: Diagnosis not present

## 2019-02-25 DIAGNOSIS — I130 Hypertensive heart and chronic kidney disease with heart failure and stage 1 through stage 4 chronic kidney disease, or unspecified chronic kidney disease: Secondary | ICD-10-CM | POA: Diagnosis not present

## 2019-02-25 DIAGNOSIS — I428 Other cardiomyopathies: Secondary | ICD-10-CM

## 2019-02-25 DIAGNOSIS — Z794 Long term (current) use of insulin: Secondary | ICD-10-CM | POA: Diagnosis not present

## 2019-02-25 DIAGNOSIS — I480 Paroxysmal atrial fibrillation: Secondary | ICD-10-CM

## 2019-02-25 DIAGNOSIS — E1122 Type 2 diabetes mellitus with diabetic chronic kidney disease: Secondary | ICD-10-CM | POA: Diagnosis not present

## 2019-02-25 DIAGNOSIS — Z0000 Encounter for general adult medical examination without abnormal findings: Secondary | ICD-10-CM

## 2019-02-25 LAB — POCT URINALYSIS DIPSTICK
Bilirubin, UA: NEGATIVE
Blood, UA: NEGATIVE
Glucose, UA: NEGATIVE
Ketones, UA: NEGATIVE
Leukocytes, UA: NEGATIVE
Nitrite, UA: NEGATIVE
Protein, UA: NEGATIVE
Spec Grav, UA: 1.025 (ref 1.010–1.025)
Urobilinogen, UA: 0.2 E.U./dL
pH, UA: 5.5 (ref 5.0–8.0)

## 2019-02-25 LAB — POCT UA - MICROALBUMIN
Albumin/Creatinine Ratio, Urine, POC: 30
Creatinine, POC: 300 mg/dL
Microalbumin Ur, POC: 30 mg/L

## 2019-02-25 MED ORDER — LIRAGLUTIDE 18 MG/3ML ~~LOC~~ SOPN: 1.2000 mg | PEN_INJECTOR | Freq: Every day | SUBCUTANEOUS | 2 refills | Status: DC

## 2019-02-25 MED ORDER — PREVNAR 13 IM SUSP: 0.5000 mL | INTRAMUSCULAR | 0 refills | Status: AC

## 2019-02-25 NOTE — Patient Instructions (Signed)
Health Maintenance, Female Adopting a healthy lifestyle and getting preventive care are important in promoting health and wellness. Ask your health care provider about:  The right schedule for you to have regular tests and exams.  Things you can do on your own to prevent diseases and keep yourself healthy. What should I know about diet, weight, and exercise? Eat a healthy diet   Eat a diet that includes plenty of vegetables, fruits, low-fat dairy products, and lean protein.  Do not eat a lot of foods that are high in solid fats, added sugars, or sodium. Maintain a healthy weight Body mass index (BMI) is used to identify weight problems. It estimates body fat based on height and weight. Your health care provider can help determine your BMI and help you achieve or maintain a healthy weight. Get regular exercise Get regular exercise. This is one of the most important things you can do for your health. Most adults should:  Exercise for at least 150 minutes each week. The exercise should increase your heart rate and make you sweat (moderate-intensity exercise).  Do strengthening exercises at least twice a week. This is in addition to the moderate-intensity exercise.  Spend less time sitting. Even light physical activity can be beneficial. Watch cholesterol and blood lipids Have your blood tested for lipids and cholesterol at 74 years of age, then have this test every 5 years. Have your cholesterol levels checked more often if:  Your lipid or cholesterol levels are high.  You are older than 74 years of age.  You are at high risk for heart disease. What should I know about cancer screening? Depending on your health history and family history, you may need to have cancer screening at various ages. This may include screening for:  Breast cancer.  Cervical cancer.  Colorectal cancer.  Skin cancer.  Lung cancer. What should I know about heart disease, diabetes, and high blood  pressure? Blood pressure and heart disease  High blood pressure causes heart disease and increases the risk of stroke. This is more likely to develop in people who have high blood pressure readings, are of African descent, or are overweight.  Have your blood pressure checked: ? Every 3-5 years if you are 18-39 years of age. ? Every year if you are 40 years old or older. Diabetes Have regular diabetes screenings. This checks your fasting blood sugar level. Have the screening done:  Once every three years after age 40 if you are at a normal weight and have a low risk for diabetes.  More often and at a younger age if you are overweight or have a high risk for diabetes. What should I know about preventing infection? Hepatitis B If you have a higher risk for hepatitis B, you should be screened for this virus. Talk with your health care provider to find out if you are at risk for hepatitis B infection. Hepatitis C Testing is recommended for:  Everyone born from 1945 through 1965.  Anyone with known risk factors for hepatitis C. Sexually transmitted infections (STIs)  Get screened for STIs, including gonorrhea and chlamydia, if: ? You are sexually active and are younger than 74 years of age. ? You are older than 74 years of age and your health care provider tells you that you are at risk for this type of infection. ? Your sexual activity has changed since you were last screened, and you are at increased risk for chlamydia or gonorrhea. Ask your health care provider if   you are at risk.  Ask your health care provider about whether you are at high risk for HIV. Your health care provider may recommend a prescription medicine to help prevent HIV infection. If you choose to take medicine to prevent HIV, you should first get tested for HIV. You should then be tested every 3 months for as long as you are taking the medicine. Pregnancy  If you are about to stop having your period (premenopausal) and  you may become pregnant, seek counseling before you get pregnant.  Take 400 to 800 micrograms (mcg) of folic acid every day if you become pregnant.  Ask for birth control (contraception) if you want to prevent pregnancy. Osteoporosis and menopause Osteoporosis is a disease in which the bones lose minerals and strength with aging. This can result in bone fractures. If you are 65 years old or older, or if you are at risk for osteoporosis and fractures, ask your health care provider if you should:  Be screened for bone loss.  Take a calcium or vitamin D supplement to lower your risk of fractures.  Be given hormone replacement therapy (HRT) to treat symptoms of menopause. Follow these instructions at home: Lifestyle  Do not use any products that contain nicotine or tobacco, such as cigarettes, e-cigarettes, and chewing tobacco. If you need help quitting, ask your health care provider.  Do not use street drugs.  Do not share needles.  Ask your health care provider for help if you need support or information about quitting drugs. Alcohol use  Do not drink alcohol if: ? Your health care provider tells you not to drink. ? You are pregnant, may be pregnant, or are planning to become pregnant.  If you drink alcohol: ? Limit how much you use to 0-1 drink a day. ? Limit intake if you are breastfeeding.  Be aware of how much alcohol is in your drink. In the [place] one drink equals one 12 oz bottle of beer (355 mL), one 5 oz glass of wine (148 mL), or one 1 oz glass of hard liquor (44 mL). General instructions  Schedule regular health, dental, and eye exams.  Stay current with your vaccines.  Tell your health care provider if: ? You often feel depressed. ? You have ever been abused or do not feel safe at home. Summary  Adopting a healthy lifestyle and getting preventive care are important in promoting health and wellness.  Follow your health care provider's instructions about healthy  diet, exercising, and getting tested or screened for diseases.  Follow your health care provider's instructions on monitoring your cholesterol and blood pressure. This information is not intended to replace advice given to you by your health care provider. Make sure you discuss any questions you have with your health care provider. Document Released: 12/03/2010 Document Revised: 05/13/2018 Document Reviewed: 05/13/2018 Elsevier Patient Education  2020 [employer]  

## 2019-02-25 NOTE — Patient Instructions (Signed)
[name] , Thank you for taking time to come for your Medicare Wellness Visit. I appreciate your ongoing commitment to your health goals. Please review the following plan we discussed and let me know if I can assist you in the future.   Screening recommendations/referrals: Colonoscopy: 12/2009 Mammogram: 07/2017 Bone Density: 03/2015 Recommended yearly ophthalmology/optometry visit for glaucoma screening and checkup Recommended yearly dental visit for hygiene and checkup  Vaccinations: Influenza vaccine: 04/2018 Pneumococcal vaccine: sent to pharmacy Tdap vaccine: 06/2012 Shingles vaccine: discussed    Advanced directives: Please bring a copy of your POA (Power of [place]) and/or Living Will to your next appointment.    Conditions/risks identified: obesity  Next appointment: 03/02/2020 at 2:15   Preventive Care 28 Years and Older, Female Preventive care refers to lifestyle choices and visits with your health care provider that can promote health and wellness. What does preventive care include?  A yearly physical exam. This is also called an annual well check.  Dental exams once or twice a year.  Routine eye exams. Ask your health care provider how often you should have your eyes checked.  Personal lifestyle choices, including:  Daily care of your teeth and gums.  Regular physical activity.  Eating a healthy diet.  Avoiding tobacco and drug use.  Limiting alcohol use.  Practicing safe sex.  Taking low-dose aspirin every day.  Taking vitamin and mineral supplements as recommended by your health care provider. What happens during an annual well check? The services and screenings done by your health care provider during your annual well check will depend on your age, overall health, lifestyle risk factors, and family history of disease. Counseling  Your health care provider may ask you questions about your:  Alcohol use.  Tobacco use.  Drug use.  Emotional  well-being.  Home and relationship well-being.  Sexual activity.  Eating habits.  History of falls.  Memory and ability to understand (cognition).  Work and work Statistician.  Reproductive health. Screening  You may have the following tests or measurements:  Height, weight, and BMI.  Blood pressure.  Lipid and cholesterol levels. These may be checked every 5 years, or more frequently if you are over 23 years old.  Skin check.  Lung cancer screening. You may have this screening every year starting at age 30 if you have a 30-pack-year history of smoking and currently smoke or have quit within the past 15 years.  Fecal occult blood test (FOBT) of the stool. You may have this test every year starting at age 30.  Flexible sigmoidoscopy or colonoscopy. You may have a sigmoidoscopy every 5 years or a colonoscopy every 10 years starting at age 68.  Hepatitis C blood test.  Hepatitis B blood test.  Sexually transmitted disease (STD) testing.  Diabetes screening. This is done by checking your blood sugar (glucose) after you have not eaten for a while (fasting). You may have this done every 1-3 years.  Bone density scan. This is done to screen for osteoporosis. You may have this done starting at age 71.  Mammogram. This may be done every 1-2 years. Talk to your health care provider about how often you should have regular mammograms. Talk with your health care provider about your test results, treatment options, and if necessary, the need for more tests. Vaccines  Your health care provider may recommend certain vaccines, such as:  Influenza vaccine. This is recommended every year.  Tetanus, diphtheria, and acellular pertussis (Tdap, Td) vaccine. You may need a  Td booster every 10 years.  Zoster vaccine. You may need this after age 75.  Pneumococcal 13-valent conjugate (PCV13) vaccine. One dose is recommended after age 65.  Pneumococcal polysaccharide (PPSV23) vaccine. One  dose is recommended after age 47. Talk to your health care provider about which screenings and vaccines you need and how often you need them. This information is not intended to replace advice given to you by your health care provider. Make sure you discuss any questions you have with your health care provider. Document Released: 06/16/2015 Document Revised: 02/07/2016 Document Reviewed: 03/21/2015 Elsevier Interactive Patient Education  2017 [place] Prevention in the Home Falls can cause injuries. They can happen to people of all ages. There are many things you can do to make your home safe and to help prevent falls. What can I do on the outside of my home?  Regularly fix the edges of walkways and driveways and fix any cracks.  Remove anything that might make you trip as you walk through a door, such as a raised step or threshold.  Trim any bushes or trees on the path to your home.  Use bright outdoor lighting.  Clear any walking paths of anything that might make someone trip, such as rocks or tools.  Regularly check to see if handrails are loose or broken. Make sure that both sides of any steps have handrails.  Any raised decks and porches should have guardrails on the edges.  Have any leaves, snow, or ice cleared regularly.  Use sand or salt on walking paths during winter.  Clean up any spills in your garage right away. This includes oil or grease spills. What can I do in the bathroom?  Use night lights.  Install grab bars by the toilet and in the tub and shower. Do not use towel bars as grab bars.  Use non-skid mats or decals in the tub or shower.  If you need to sit down in the shower, use a plastic, non-slip stool.  Keep the floor dry. Clean up any water that spills on the floor as soon as it happens.  Remove soap buildup in the tub or shower regularly.  Attach bath mats securely with double-sided non-slip rug tape.  Do not have throw rugs and other  things on the floor that can make you trip. What can I do in the bedroom?  Use night lights.  Make sure that you have a light by your bed that is easy to reach.  Do not use any sheets or blankets that are too big for your bed. They should not hang down onto the floor.  Have a firm chair that has side arms. You can use this for support while you get dressed.  Do not have throw rugs and other things on the floor that can make you trip. What can I do in the kitchen?  Clean up any spills right away.  Avoid walking on wet floors.  Keep items that you use a lot in easy-to-reach places.  If you need to reach something above you, use a strong step stool that has a grab bar.  Keep electrical cords out of the way.  Do not use floor polish or wax that makes floors slippery. If you must use wax, use non-skid floor wax.  Do not have throw rugs and other things on the floor that can make you trip. What can I do with my stairs?  Do not leave any items on the stairs.  Make sure that there are handrails on both sides of the stairs and use them. Fix handrails that are broken or loose. Make sure that handrails are as long as the stairways.  Check any carpeting to make sure that it is firmly attached to the stairs. Fix any carpet that is loose or worn.  Avoid having throw rugs at the top or bottom of the stairs. If you do have throw rugs, attach them to the floor with carpet tape.  Make sure that you have a light switch at the top of the stairs and the bottom of the stairs. If you do not have them, ask someone to add them for you. What else can I do to help prevent falls?  Wear shoes that:  Do not have high heels.  Have rubber bottoms.  Are comfortable and fit you well.  Are closed at the toe. Do not wear sandals.  If you use a stepladder:  Make sure that it is fully opened. Do not climb a closed stepladder.  Make sure that both sides of the stepladder are locked into place.  Ask  someone to hold it for you, if possible.  Clearly mark and make sure that you can see:  Any grab bars or handrails.  First and last steps.  Where the edge of each step is.  Use tools that help you move around (mobility aids) if they are needed. These include:  Canes.  Walkers.  Scooters.  Crutches.  Turn on the lights when you go into a dark area. Replace any light bulbs as soon as they burn out.  Set up your furniture so you have a clear path. Avoid moving your furniture around.  If any of your floors are uneven, fix them.  If there are any pets around you, be aware of where they are.  Review your medicines with your doctor. Some medicines can make you feel dizzy. This can increase your chance of falling. Ask your doctor what other things that you can do to help prevent falls. This information is not intended to replace advice given to you by your health care provider. Make sure you discuss any questions you have with your health care provider. Document Released: 03/16/2009 Document Revised: 10/26/2015 Document Reviewed: 06/24/2014 Elsevier Interactive Patient Education  2017 [employer].

## 2019-02-25 NOTE — Progress Notes (Addendum)
Subjective:   [name] is a 73 y.o. female who presents for Medicare Annual (Subsequent) preventive examination.  Review of Systems:  n/a Cardiac Risk Factors include: advanced age (>62men, >88 women);diabetes mellitus;hypertension;obesity (BMI >30kg/m2)     Objective:     Vitals: BP 118/76 (BP Location: Right Arm, Patient Position: Sitting, Cuff Size: Normal)   Pulse 98   Temp 97.6 F (36.4 C) (Oral)   Ht 5' 6.4" (1.687 m)   Wt 224 lb 12.8 oz (102 kg)   SpO2 99%   BMI 35.85 kg/m   Body mass index is 35.85 kg/m.  Advanced Directives 02/25/2019 05/16/2013 03/12/2013 03/10/2013 08/17/2011  Does Patient Have a Medical Advance Directive? Yes Patient has advance directive, copy not in chart Patient has advance directive, copy not in chart Patient has advance directive, copy not in chart Patient has advance directive, copy in chart  Type of Advance Directive Living will Healthcare Power of Attorney Living will Andrew;Living will Victoria  Does patient want to make changes to medical advance directive? - No - - -  Copy of [place] in Chart? - Copy requested from family - - -  Pre-existing out of facility DNR order (yellow form or pink MOST form) - No No - No    Tobacco Social History   Tobacco Use  Smoking Status Current Every Day Smoker  . Packs/day: 0.50  . Years: 40.00  . Pack years: 20.00  . Types: Cigarettes  Smokeless Tobacco Never Used  Tobacco Comment   smoked 1.5ppd x 9, 1ppdx40, cut back to 1/2ppd past 5 years     Ready to quit: Yes Counseling given: Not Answered Comment: smoked 1.5ppd x 9, 1ppdx40, cut back to 1/2ppd past 5 years   Clinical Intake:  Pre-visit preparation completed: Yes  Pain : No/denies pain     Nutritional Status: BMI > 30  Obese Nutritional Risks: Nausea/ vomitting/ diarrhea(last week was having diarrhea) Diabetes: Yes CBG done?: No Did pt. bring in CBG monitor from  home?: No  How often do you need to have someone help you when you read instructions, pamphlets, or other written materials from your doctor or pharmacy?: 1 - Never What is the last grade level you completed in school?: 2 years college  Interpreter Needed?: No  Information entered by :: NAllen LPN  Past Medical History:  Diagnosis Date  . Arthritis    "left knee" (03/12/2013)  . Atrial fibrillation ([place])    Cardioverted to NSR 08/22/2009  . Cardiomyopathy   . CHF (congestive heart failure) ([place])   . CKD (chronic kidney disease)   . Depression   . Dizziness   . DM2 (diabetes mellitus, type 2) ([place])    fasting 80-100 (03/12/2013)  . Drug therapy 1/11   coumadin  . Ejection fraction < 50%    20-25%, echo.06/2009, /  EF 30-35%, diffuse hypokinesis, echo, Oct 16, 2010  . GERD (gastroesophageal reflux disease)   . Gout    "left knee" (03/12/2013)  . History of bronchitis   . History of emphysema   . HLD (hyperlipidemia)   . Hyperkalemia   . Hypertension   . LFT elevation    06/2009  . Mitral regurgitation    mild - echo - 1/11 /  Mild, echo, May15, 2012  . Overweight(278.02)   . Pneumonia    "@ least twice" (03/12/2013)  . Thyroid dysfunction    thyromegally,diffuse,nodule LLL,06/2009  . Tobacco abuse   .  Tricuspid regurgitation    moderate - echo - 1/11   Past Surgical History:  Procedure Laterality Date  . BREAST BIOPSY Right 1980's  . BREAST LUMPECTOMY Right 1980's  . CATARACT EXTRACTION W/ INTRAOCULAR LENS  IMPLANT, BILATERAL Bilateral 2010-2012  . CHOLECYSTECTOMY  ~ 1978  . JOINT REPLACEMENT    . TONSILLECTOMY  1960's  . TOTAL KNEE ARTHROPLASTY Left 03/12/2013  . TOTAL KNEE ARTHROPLASTY Left 03/12/2013   Procedure: TOTAL KNEE ARTHROPLASTY;  Surgeon: Newt Minion, MD;  Location: [facility];  Service: Orthopedics;  Laterality: Left;  Left total knee arthroplasty  . TUBAL LIGATION    . VAGINAL HYSTERECTOMY  1987   Family History  Problem Relation Age of Onset  .  Stroke Mother   . Prostate cancer Father   . Colon cancer Other   . Heart disease Other   . Heart attack Son   . Hypertension Sister   . Hypertension Son        [id number]  . Stroke Maternal Grandfather   . Stroke Paternal Grandmother    Social History   Socioeconomic History  . Marital status: Divorced    Spouse name: Not on file  . Number of children: Not on file  . Years of education: Not on file  . Highest education level: Not on file  Occupational History  . Occupation: retired  Scientific laboratory technician  . Financial resource strain: Hard  . Food insecurity    Worry: Sometimes true    Inability: Sometimes true  . Transportation needs    Medical: No    Non-medical: No  Tobacco Use  . Smoking status: Current Every Day Smoker    Packs/day: 0.50    Years: 40.00    Pack years: 20.00    Types: Cigarettes  . Smokeless tobacco: Never Used  . Tobacco comment: smoked 1.5ppd x 9, 1ppdx40, cut back to 1/2ppd past 5 years  Substance and Sexual Activity  . Alcohol use: No  . Drug use: No  . Sexual activity: Not Currently  Lifestyle  . Physical activity    Days per week: 7 days    Minutes per session: 10 min  . Stress: Not at all  Relationships  . Social Herbalist on phone: Not on file    Gets together: Not on file    Attends religious service: Not on file    Active member of club or organization: Not on file    Attends meetings of clubs or organizations: Not on file    Relationship status: Not on file  Other Topics Concern  . Not on file  Social History Narrative  . Not on file    Outpatient Encounter Medications as of 02/25/2019  Medication Sig  . acetaminophen (TYLENOL) 500 MG tablet Take 1,000 mg by mouth 3 (three) times daily as needed for pain.  . BD PEN NEEDLE NANO U/F 32G X 4 MM MISC USE AS DIRECTED WITH INSULIN PENS  . Cholecalciferol (VITAMIN D) 2000 UNITS CAPS Take 2,000 Units by mouth daily.  . colchicine 0.6 MG tablet Take 0.6 mg by mouth 2 (two) times daily  as needed (GOUT).   Marland Kitchen digoxin (LANOXIN) 0.125 MG tablet Take 1 tablet (0.125 mg total) by mouth daily.  Marland Kitchen ELIQUIS 5 MG TABS tablet TAKE 1 TABLET BY MOUTH TWICE A DAY  . enalapril (VASOTEC) 5 MG tablet Take 5 mg by mouth at bedtime.   . fluocinonide cream (LIDEX) 0.05 % Apply topically as needed (as  directed for dry skin).   . fluticasone (FLONASE) 50 MCG/ACT nasal spray Place 1 spray into both nostrils daily.  . furosemide (LASIX) 40 MG tablet TAKE 1 TABLET BY MOUITH DAILY AS NEEDED FOR EDEMA FOR FLUID OVERLOAD  . KLOR-CON M20 20 MEQ tablet TAKE 1 TABLET (20 MEQ TOTAL) BY MOUTH DAILY. WHEN TAKING FUROSEMIDE (LASIX).  Marland Kitchen metoprolol succinate (TOPROL-XL) 50 MG 24 hr tablet Take 1 tablet (50 mg total) by mouth daily. Take with or immediately following a meal.  . Multiple Vitamin (MULITIVITAMIN WITH MINERALS) TABS Take 1 tablet by mouth daily. Centrum Silver  . mupirocin ointment (BACTROBAN) 2 % Apply 1 application topically daily.   [name] VERIO test strip USE AS DIRECTED TO CHECK BLOOD SUGARS 1 TIME PER DAY DX: E11.22  . pantoprazole (PROTONIX) 20 MG tablet Take 20 mg by mouth 2 (two) times daily.  . Pitavastatin Calcium (LIVALO) 2 MG TABS Take by mouth. Pt take 1 tablet M-W-F  . pneumococcal 13-valent conjugate vaccine (PREVNAR 13) SUSP injection Inject 0.5 mLs into the muscle tomorrow at 10 am for 1 dose.  . vitamin C (ASCORBIC ACID) 500 MG tablet Take 500 mg by mouth daily.  . [DISCONTINUED] Liraglutide (VICTOZA) 18 MG/3ML SOLN Inject 1.2 mg into the skin daily after supper. 4pm   No facility-administered encounter medications on file as of 02/25/2019.     Activities of Daily Living In your present state of health, do you have any difficulty performing the following activities: 02/25/2019 02/04/2019  Hearing? N N  Vision? N N  Difficulty concentrating or making decisions? N N  Walking or climbing stairs? Y N  Comment gets SOB -  Dressing or bathing? N Y  Comment - she has some handicap  rails in there that was for her mom that she now uses  Doing errands, shopping? N N  Preparing Food and eating ? N -  Using the Toilet? N -  In the past six months, have you accidently leaked urine? N -  Do you have problems with loss of bowel control? Y -  Comment due to amoxicillin -  Managing your Medications? N -  Managing your Finances? N -  Housekeeping or managing your Housekeeping? N -  Some recent data might be hidden    Patient Care Team: [name], MD as PCP - General (Internal Medicine) [name], MD as PCP - Cardiology (Cardiology) Carol Ada, MD as Consulting Physician (Gastroenterology)    Assessment:   This is a routine wellness examination for [facility].  Exercise Activities and Dietary recommendations Current Exercise Habits: Home exercise routine, Type of exercise: walking, Time (Minutes): 10, Frequency (Times/Week): 7, Weekly Exercise (Minutes/Week): 70  Goals    . Patient Stated     02/25/2019, to stay alive       Fall Risk Fall Risk  02/25/2019 02/25/2019 02/04/2019 11/30/2018 08/03/2018  Falls in the past year? 0 0 0 0 0  Risk for fall due to : Medication side effect - - - -  Follow up Falls evaluation completed;Education provided;Falls prevention discussed - - - -   Is the patient's home free of loose throw rugs in walkways, pet beds, electrical cords, etc?   yes      Grab bars in the bathroom? yes      Handrails on the stairs?   yes      Adequate lighting?   yes  Timed Get Up and Go performed: n/a  Depression Screen PHQ 2/9 Scores 02/25/2019 02/04/2019  11/30/2018 08/03/2018  PHQ - 2 Score 0 0 0 0  PHQ- 9 Score 0 - - -     Cognitive Function     6CIT Screen 02/25/2019  What Year? 0 points  What month? 0 points  What time? 0 points  Count back from 20 0 points  Months in reverse 0 points  Repeat phrase 0 points  Total Score 0    Immunization History  Administered Date(s) Administered  . Influenza, High Dose Seasonal PF 04/29/2018  .  Influenza,inj,Quad PF,6+ Mos 03/13/2013  . Pneumococcal-Unspecified 08/01/2011    Qualifies for Shingles Vaccine? yes  Screening Tests Health Maintenance  Topic Date Due  . OPHTHALMOLOGY EXAM  10/14/1955  . PNA vac Low Risk Adult (2 of 2 - PCV13) 07/31/2012  . INFLUENZA VACCINE  01/02/2019  . HEMOGLOBIN A1C  06/02/2019  . MAMMOGRAM  07/02/2019  . COLONOSCOPY  12/29/2019  . FOOT EXAM  02/25/2020  . TETANUS/TDAP  06/18/2022  . DEXA SCAN  Completed  . Hepatitis C Screening  Completed    Cancer Screenings: Lung: Low Dose CT Chest recommended if Age 52-80 years, 30 pack-year currently smoking OR have quit w/in 15years. Patient does not qualify. Breast:  Up to date on Mammogram? Yes   Up to date of Bone Density/Dexa? Yes Colorectal: up to date  Additional Screenings: : Hepatitis C Screening: 06/2012     Plan:    Patient goal is to stay alive.   I have personally reviewed and noted the following in the patient's chart:   . Medical and social history . Use of alcohol, tobacco or illicit drugs  . Current medications and supplements . Functional ability and status . Nutritional status . Physical activity . Advanced directives . List of other physicians . Hospitalizations, surgeries, and ER visits in previous 12 months . Vitals . Screenings to include cognitive, depression, and falls . Referrals and appointments  In addition, I have reviewed and discussed with patient certain preventive protocols, quality metrics, and best practice recommendations. A written personalized care plan for preventive services as well as general preventive health recommendations were provided to patient.     Kellie Simmering, LPN  01/25/538

## 2019-02-26 ENCOUNTER — Telehealth: Payer: Self-pay

## 2019-02-26 LAB — CMP14+EGFR
ALT: 15 IU/L (ref 0–32)
AST: 21 IU/L (ref 0–40)
Albumin/Globulin Ratio: 1.5 (ref 1.2–2.2)
Albumin: 4.4 g/dL (ref 3.7–4.7)
Alkaline Phosphatase: 98 IU/L (ref 39–117)
BUN/Creatinine Ratio: 30 — ABNORMAL HIGH (ref 12–28)
BUN: 41 mg/dL — ABNORMAL HIGH (ref 8–27)
Bilirubin Total: 0.4 mg/dL (ref 0.0–1.2)
CO2: 25 mmol/L (ref 20–29)
Calcium: 10.2 mg/dL (ref 8.7–10.3)
Chloride: 98 mmol/L (ref 96–106)
Creatinine, Ser: 1.38 mg/dL — ABNORMAL HIGH (ref 0.57–1.00)
GFR calc Af Amer: 43 mL/min/{1.73_m2} — ABNORMAL LOW (ref >59)
GFR calc non Af Amer: 38 mL/min/{1.73_m2} — ABNORMAL LOW (ref >59)
Globulin, Total: 2.9 g/dL (ref 1.5–4.5)
Glucose: 97 mg/dL (ref 65–99)
Potassium: 3.8 mmol/L (ref 3.5–5.2)
Sodium: 139 mmol/L (ref 134–144)
Total Protein: 7.3 g/dL (ref 6.0–8.5)

## 2019-02-26 LAB — HEMOGLOBIN A1C
Est. average glucose Bld gHb Est-mCnc: 131 mg/dL
Hgb A1c MFr Bld: 6.2 % — ABNORMAL HIGH (ref 4.8–5.6)

## 2019-02-26 LAB — CBC
Hematocrit: 43.6 % (ref 34.0–46.6)
Hemoglobin: 13.9 g/dL (ref 11.1–15.9)
MCH: 26 pg — ABNORMAL LOW (ref 26.6–33.0)
MCHC: 31.9 g/dL (ref 31.5–35.7)
MCV: 82 fL (ref 79–97)
Platelets: 237 10*3/uL (ref 150–450)
RBC: 5.34 x10E6/uL — ABNORMAL HIGH (ref 3.77–5.28)
RDW: 13.5 % (ref 11.7–15.4)
WBC: 9.6 10*3/uL (ref 3.4–10.8)

## 2019-02-26 LAB — LIPID PANEL
Chol/HDL Ratio: 3.6 ratio (ref 0.0–4.4)
Cholesterol, Total: 134 mg/dL (ref 100–199)
HDL: 37 mg/dL — ABNORMAL LOW (ref >39)
LDL Chol Calc (NIH): 72 mg/dL (ref 0–99)
Triglycerides: 142 mg/dL (ref 0–149)
VLDL Cholesterol Cal: 25 mg/dL (ref 5–40)

## 2019-02-26 NOTE — Telephone Encounter (Signed)
1st attempt to give lab results  

## 2019-02-26 NOTE — Telephone Encounter (Signed)
-----   Message from [name], MD sent at 02/26/2019 11:11 AM EDT ----- Here are your lab results:  Your kidney function is stable. Be sure to stay well hydrated.  Your kidney function is normal. Your blood count is normal.    Your HDL is low. I would like for you to start performing chair exercises to help this improve. Do you eat fish like salmon or sardines? If no, please start eating once weekly.   Your hba1c is 6.2, this is great! Please let me know if you have any questions or concerns.   Sincerely,    Robyn N. Baird [facility], MD

## 2019-03-01 ENCOUNTER — Encounter: Payer: Medicare Other | Admitting: Internal Medicine

## 2019-03-08 ENCOUNTER — Other Ambulatory Visit: Payer: Self-pay | Admitting: Internal Medicine

## 2019-03-08 DIAGNOSIS — E041 Nontoxic single thyroid nodule: Secondary | ICD-10-CM

## 2019-03-10 ENCOUNTER — Encounter: Payer: Self-pay | Admitting: Internal Medicine

## 2019-03-11 ENCOUNTER — Ambulatory Visit (INDEPENDENT_AMBULATORY_CARE_PROVIDER_SITE_OTHER): Payer: Medicare Other

## 2019-03-11 ENCOUNTER — Other Ambulatory Visit: Payer: Self-pay

## 2019-03-11 VITALS — BP 126/78 | HR 52 | Temp 97.7°F | Ht 66.8 in | Wt 224.4 lb

## 2019-03-11 DIAGNOSIS — Z23 Encounter for immunization: Secondary | ICD-10-CM

## 2019-03-11 NOTE — Progress Notes (Signed)
Pt presents today for flu shot 

## 2019-03-20 ENCOUNTER — Encounter: Payer: Self-pay | Admitting: Internal Medicine

## 2019-03-20 NOTE — Progress Notes (Signed)
Subjective:     Patient ID: [name] , female    DOB: 1946-04-02 , 73 y.o.   MRN: [id number]   Chief Complaint  Patient presents with  . Annual Exam  . Diabetes  . Hypertension    HPI  She is here today for a full physical examination. She feels well. She does not get out much, she does not want to get sick. She denies feeling depressed.   Diabetes She presents for her follow-up diabetic visit. She has type 2 diabetes mellitus. Her disease course has been stable. There are no hypoglycemic associated symptoms. Pertinent negatives for diabetes include no blurred vision. There are no hypoglycemic complications. Diabetic complications include nephropathy. Risk factors for coronary artery disease include diabetes mellitus, dyslipidemia, hypertension, obesity, post-menopausal and sedentary lifestyle. Meal planning includes avoidance of concentrated sweets. She participates in exercise intermittently. An ACE inhibitor/angiotensin II receptor blocker is being taken. Eye exam is current.  Hypertension This is a chronic problem. The current episode started more than 1 year ago. The problem has been gradually improving since onset. The problem is controlled. Pertinent negatives include no blurred vision. Risk factors for coronary artery disease include diabetes mellitus, dyslipidemia, obesity, post-menopausal state and sedentary lifestyle. The current treatment provides moderate improvement. Compliance problems include exercise.      Past Medical History:  Diagnosis Date  . Arthritis    "left knee" (03/12/2013)  . Atrial fibrillation ([place])    Cardioverted to NSR 08/22/2009  . Cardiomyopathy   . CHF (congestive heart failure) ([place])   . CKD (chronic kidney disease)   . Depression   . Dizziness   . DM2 (diabetes mellitus, type 2) ([place])    fasting 80-100 (03/12/2013)  . Drug therapy 1/11   coumadin  . Ejection fraction < 50%    20-25%, echo.06/2009, /  EF 30-35%, diffuse hypokinesis,  echo, Oct 16, 2010  . GERD (gastroesophageal reflux disease)   . Gout    "left knee" (03/12/2013)  . History of bronchitis   . History of emphysema   . HLD (hyperlipidemia)   . Hyperkalemia   . Hypertension   . LFT elevation    06/2009  . Mitral regurgitation    mild - echo - 1/11 /  Mild, echo, May15, 2012  . Overweight(278.02)   . Pneumonia    "@ least twice" (03/12/2013)  . Thyroid dysfunction    thyromegally,diffuse,nodule LLL,06/2009  . Tobacco abuse   . Tricuspid regurgitation    moderate - echo - 1/11     Family History  Problem Relation Age of Onset  . Stroke Mother   . Prostate cancer Father   . Colon cancer Other   . Heart disease Other   . Heart attack Son   . Hypertension Sister   . Hypertension Son        [id number]  . Stroke Maternal Grandfather   . Stroke Paternal Grandmother      Current Outpatient Medications:  .  acetaminophen (TYLENOL) 500 MG tablet, Take 1,000 mg by mouth 3 (three) times daily as needed for pain., Disp: , Rfl:  .  BD PEN NEEDLE NANO U/F 32G X 4 MM MISC, USE AS DIRECTED WITH INSULIN PENS, Disp: 100 each, Rfl: 11 .  Cholecalciferol (VITAMIN D) 2000 UNITS CAPS, Take 2,000 Units by mouth daily., Disp: , Rfl:  .  colchicine 0.6 MG tablet, Take 0.6 mg by mouth 2 (two) times daily as needed (GOUT). , Disp: , Rfl:  .  digoxin (LANOXIN) 0.125 MG tablet, Take 1 tablet (0.125 mg total) by mouth daily., Disp: 90 tablet, Rfl: 3 .  ELIQUIS 5 MG TABS tablet, TAKE 1 TABLET BY MOUTH TWICE A DAY, Disp: 180 tablet, Rfl: 1 .  enalapril (VASOTEC) 5 MG tablet, Take 5 mg by mouth at bedtime. , Disp: , Rfl:  .  fluocinonide cream (LIDEX) 0.05 %, Apply topically as needed (as directed for dry skin). , Disp: , Rfl: 0 .  fluticasone (FLONASE) 50 MCG/ACT nasal spray, Place 1 spray into both nostrils daily., Disp: 16 g, Rfl: 2 .  furosemide (LASIX) 40 MG tablet, TAKE 1 TABLET BY MOUITH DAILY AS NEEDED FOR EDEMA FOR FLUID OVERLOAD, Disp: 90 tablet, Rfl: 2 .  KLOR-CON M20  20 MEQ tablet, TAKE 1 TABLET (20 MEQ TOTAL) BY MOUTH DAILY. WHEN TAKING FUROSEMIDE (LASIX)., Disp: 90 tablet, Rfl: 2 .  liraglutide (VICTOZA) 18 MG/3ML SOPN, Inject 0.2 mLs (1.2 mg total) into the skin daily., Disp: 6 pen, Rfl: 2 .  metoprolol succinate (TOPROL-XL) 50 MG 24 hr tablet, Take 1 tablet (50 mg total) by mouth daily. Take with or immediately following a meal., Disp: 90 tablet, Rfl: 3 .  Multiple Vitamin (MULITIVITAMIN WITH MINERALS) TABS, Take 1 tablet by mouth daily. Centrum Silver, Disp: , Rfl:  .  mupirocin ointment (BACTROBAN) 2 %, Apply 1 application topically daily. , Disp: , Rfl:  .  ONETOUCH VERIO test strip, USE AS DIRECTED TO CHECK BLOOD SUGARS 1 TIME PER DAY DX: E11.22, Disp: 50 strip, Rfl: 7 .  pantoprazole (PROTONIX) 20 MG tablet, Take 20 mg by mouth 2 (two) times daily., Disp: , Rfl:  .  Pitavastatin Calcium (LIVALO) 2 MG TABS, Take by mouth. Pt take 1 tablet M-W-F, Disp: , Rfl:  .  vitamin C (ASCORBIC ACID) 500 MG tablet, Take 500 mg by mouth daily., Disp: , Rfl:    Allergies  Allergen Reactions  . Codeine Hives and Nausea And Vomiting  . Augmentin [Amoxicillin-Pot Clavulanate] Diarrhea  . Warfarin And Related     Severe rectal bleeding      The patient states she uses post menopausal status for birth control. Last LMP was No LMP recorded. Patient has had a hysterectomy.. Negative for Dysmenorrhea Negative for: breast discharge, breast lump(s), breast pain and breast self exam. Associated symptoms include abnormal vaginal bleeding. Pertinent negatives include abnormal bleeding (hematology), anxiety, decreased libido, depression, difficulty falling sleep, dyspareunia, history of infertility, nocturia, sexual dysfunction, sleep disturbances, urinary incontinence, urinary urgency, vaginal discharge and vaginal itching. Diet regular.The patient states her exercise level is   intermittent.   . The patient's tobacco use is:  Social History   Tobacco Use  Smoking Status  Current Every Day Smoker  . Packs/day: 0.50  . Years: 40.00  . Pack years: 20.00  . Types: Cigarettes  Smokeless Tobacco Never Used  Tobacco Comment   smoked 1.5ppd x 9, 1ppdx40, cut back to 1/2ppd past 5 years  . She has been exposed to passive smoke. The patient's alcohol use is:  Social History   Substance and Sexual Activity  Alcohol Use No    Review of Systems  Constitutional: Negative.   HENT: Negative.   Eyes: Negative.  Negative for blurred vision.  Respiratory: Negative.   Cardiovascular: Negative.   Endocrine: Negative.   Genitourinary: Negative.   Musculoskeletal: Negative.   Skin: Negative.   Allergic/Immunologic: Negative.   Neurological: Negative.   Hematological: Negative.   Psychiatric/Behavioral: Negative.  Today's Vitals   02/25/19 1609  BP: 118/76  Pulse: 98  Temp: 97.6 F (36.4 C)  TempSrc: Oral  SpO2: 99%  Weight: 224 lb 12.8 oz (102 kg)  Height: 5' 6.4" (1.687 m)   Body mass index is 35.85 kg/m.   Objective:  Physical Exam Vitals signs and nursing note reviewed.  Constitutional:      Appearance: Normal appearance. She is obese.  HENT:     Head: Normocephalic and atraumatic.     Right Ear: Tympanic membrane, ear canal and external ear normal.     Left Ear: Tympanic membrane, ear canal and external ear normal.     Nose: Nose normal.     Mouth/Throat:     Mouth: Mucous membranes are moist.     Pharynx: Oropharynx is clear.  Eyes:     Extraocular Movements: Extraocular movements intact.     Conjunctiva/sclera: Conjunctivae normal.     Pupils: Pupils are equal, round, and reactive to light.  Neck:     Musculoskeletal: Normal range of motion and neck supple.  Cardiovascular:     Rate and Rhythm: Normal rate and regular rhythm.     Pulses: Normal pulses.     Heart sounds: Normal heart sounds.  Pulmonary:     Effort: Pulmonary effort is normal.     Breath sounds: Normal breath sounds.  Chest:     Breasts: Tanner Score is 5.         Right: Normal.        Left: Normal.     Comments: pendulous Abdominal:     General: Abdomen is flat. Bowel sounds are normal.     Palpations: Abdomen is soft.  Genitourinary:    Comments: deferred Musculoskeletal: Normal range of motion.  Feet:     Right foot:     Protective Sensation: 5 sites tested. 5 sites sensed.     Skin integrity: Callus and dry skin present.     Toenail Condition: Right toenails are abnormally thick.     Left foot:     Protective Sensation: 5 sites tested. 5 sites sensed.     Skin integrity: Callus and dry skin present.     Toenail Condition: Left toenails are abnormally thick.  Skin:    General: Skin is warm and dry.     Comments: Scattered, hyperpigmented lesions on anterior chest, underneath breasts. A lot of these have scabs on them. No vesicular lesions noted.   Neurological:     General: No focal deficit present.     Mental Status: She is alert and oriented to person, place, and time.  Psychiatric:        Mood and Affect: Mood normal.        Behavior: Behavior normal.         Assessment And Plan:     1. Routine general medical examination at health care facility  A full exam was performed.  Importance of monthly self breast exams was discussed with the patient.  PATIENT HAS BEEN ADVISED TO GET 30-45 MINUTES REGULAR EXERCISE NO LESS THAN FOUR TO FIVE DAYS PER WEEK - BOTH WEIGHTBEARING EXERCISES AND AEROBIC ARE RECOMMENDED.  SHE WAS ADVISED TO FOLLOW A HEALTHY DIET WITH AT LEAST SIX FRUITS/VEGGIES PER DAY, DECREASE INTAKE OF RED MEAT, AND TO INCREASE FISH INTAKE TO TWO DAYS PER WEEK.  MEATS/FISH SHOULD NOT BE FRIED, BAKED OR BROILED IS PREFERABLE.  I SUGGEST WEARING SPF 50 SUNSCREEN ON EXPOSED PARTS AND ESPECIALLY WHEN IN THE DIRECT  SUNLIGHT FOR AN EXTENDED PERIOD OF TIME.  PLEASE AVOID FAST FOOD RESTAURANTS AND INCREASE YOUR WATER INTAKE.   2. Type 2 diabetes mellitus with stage 3 chronic kidney disease, with long-term current use of insulin  ([place])  Diabetic foot exam was performed.  She also reports that she feels "better" on Victoza. She wants to switch back to this. She did not have any issues with Ozempic, she just feels "better" on Victoza. I will send in a new rx to the pharmacy.  I DISCUSSED WITH THE PATIENT AT LENGTH REGARDING THE GOALS OF GLYCEMIC CONTROL AND POSSIBLE LONG-TERM COMPLICATIONS.  I  ALSO STRESSED THE IMPORTANCE OF COMPLIANCE WITH HOME GLUCOSE MONITORING, DIETARY RESTRICTIONS INCLUDING AVOIDANCE OF SUGARY DRINKS/PROCESSED FOODS,  ALONG WITH REGULAR EXERCISE.  I  ALSO STRESSED THE IMPORTANCE OF ANNUAL EYE EXAMS, SELF FOOT CARE AND COMPLIANCE WITH OFFICE VISITS.  - CMP14+EGFR - CBC - Lipid panel - Hemoglobin A1c - POCT Urinalysis Dipstick (81002) - POCT UA - Microalbumin  3. Hypertensive heart and renal disease with renal failure, stage 1 through stage 4 or unspecified chronic kidney disease, with heart failure ([place])  Chronic,well controlled. She will continue with current meds. She is encouraged to avoid adding salt to her foods. EKG performed, she is in sinus rhythm today.   - EKG 12-Lead  4. Paroxysmal atrial fibrillation ([facility])  Chronic, intermittent. She is currently in sinus rhythm.   5. NICM (nonischemic cardiomyopathy) ([facility])  Chronic, yet stable. She is encouraged to continue to follow a low sodium diet.   6. Thyroid nodule  Recent thyroid ultrasound results reviewed in full detail. She agrees to move forward with biopsy. She is aware that she will need to stop Eliquis 2 days prior to procedure. She verbally understands that she will resume dosing the day of her procedure (AFTER the procedure).   7. Class 2 severe obesity due to excess calories with serious comorbidity and body mass index (BMI) of 35.0 to 35.9 in adult [facility])  Importance of achieving optimal weight to decrease risk of cardiovascular disease and cancers was discussed with the patient in full detail. She is encouraged to start slowly  - start with 10 minutes twice daily at least three to four days per week and to gradually build to 30 minutes five days weekly. She was given tips to incorporate more activity into her daily routine - take stairs when possible, park farther away from grocery stores, etc.    Maximino Greenland, MD    THE PATIENT IS ENCOURAGED TO PRACTICE SOCIAL DISTANCING DUE TO THE COVID-19 PANDEMIC.

## 2019-04-07 ENCOUNTER — Other Ambulatory Visit: Payer: Medicare Other

## 2019-04-08 ENCOUNTER — Ambulatory Visit
Admission: RE | Admit: 2019-04-08 | Discharge: 2019-04-08 | Disposition: A | Discharge status: Home / Self Care | Payer: Medicare Other | Source: Ambulatory Visit | Attending: Internal Medicine | Admitting: Internal Medicine

## 2019-04-08 ENCOUNTER — Other Ambulatory Visit (HOSPITAL_COMMUNITY)
Admission: RE | Admit: 2019-04-08 | Discharge: 2019-04-08 | Disposition: A | Discharge status: Home / Self Care | Payer: Medicare Other | Source: Ambulatory Visit | Attending: Radiology | Admitting: Radiology

## 2019-04-08 DIAGNOSIS — E041 Nontoxic single thyroid nodule: Secondary | ICD-10-CM

## 2019-04-08 DIAGNOSIS — E0789 Other specified disorders of thyroid: Secondary | ICD-10-CM | POA: Diagnosis not present

## 2019-04-09 LAB — CYTOLOGY - NON PAP

## 2019-04-12 NOTE — Progress Notes (Signed)
This encounter was created in error - please disregard.

## 2019-04-14 DIAGNOSIS — E041 Nontoxic single thyroid nodule: Secondary | ICD-10-CM | POA: Diagnosis not present

## 2019-05-07 ENCOUNTER — Encounter (HOSPITAL_COMMUNITY): Payer: Self-pay

## 2019-05-19 ENCOUNTER — Telehealth: Payer: Self-pay

## 2019-05-19 NOTE — Telephone Encounter (Signed)
Called pt to see if she is willing to take a cholesterol medication, patient stated yes whatever Dr. Baird Cancer feel is best but yes she would if she needs to

## 2019-05-20 ENCOUNTER — Other Ambulatory Visit: Payer: Self-pay

## 2019-05-20 ENCOUNTER — Ambulatory Visit: Payer: Medicare Other | Admitting: Internal Medicine

## 2019-05-20 MED ORDER — PRAVASTATIN SODIUM 40 MG PO TABS: 40.0000 mg | ORAL_TABLET | Freq: Every day | ORAL | 1 refills | Status: DC

## 2019-06-01 ENCOUNTER — Encounter: Payer: Self-pay | Admitting: Internal Medicine

## 2019-06-02 ENCOUNTER — Other Ambulatory Visit: Payer: Self-pay | Admitting: Interventional Cardiology

## 2019-06-02 ENCOUNTER — Other Ambulatory Visit: Payer: Self-pay | Admitting: Internal Medicine

## 2019-06-07 ENCOUNTER — Other Ambulatory Visit: Payer: Self-pay

## 2019-06-07 MED ORDER — ENALAPRIL MALEATE 5 MG PO TABS: 5.0000 mg | ORAL_TABLET | Freq: Every day | ORAL | 1 refills | Status: DC

## 2019-06-09 ENCOUNTER — Other Ambulatory Visit: Payer: Self-pay | Admitting: Internal Medicine

## 2019-06-09 ENCOUNTER — Other Ambulatory Visit: Payer: Self-pay

## 2019-06-15 ENCOUNTER — Ambulatory Visit: Payer: Medicare Other | Admitting: Internal Medicine

## 2019-06-15 ENCOUNTER — Telehealth: Payer: Self-pay

## 2019-06-15 NOTE — Telephone Encounter (Signed)
The pt called and said that she canceled her appt today with Dr. Baird Cancer because she was feeling like she was going to black out and that her sugar is 81 The pt was told that Dr. Baird Cancer said to contact her cardiologist and to let her know what is going on.

## 2019-06-22 ENCOUNTER — Telehealth: Payer: Self-pay

## 2019-06-22 NOTE — Telephone Encounter (Signed)
Evelena Peat RN with [facility] called and left a message that the pt said that she passed out, she was having tremors and a fast heart beat.  The pt's sugar was 104 for today, blood pressure is good, she recommended the pt go to the ER and the pt said that she was ok.  I called the pt and advised her that she needed to go to the ER to be evaluated because the pt hit her head when she fell and hurt her knees.  The pt said that she will find someone to take her to the ER.

## 2019-06-24 ENCOUNTER — Telehealth: Payer: Self-pay | Admitting: *Deleted

## 2019-06-24 NOTE — Telephone Encounter (Signed)
Patient is due for follow up with Dr [name]  Left message to call back

## 2019-06-25 ENCOUNTER — Other Ambulatory Visit: Payer: Self-pay | Admitting: Internal Medicine

## 2019-06-29 ENCOUNTER — Other Ambulatory Visit: Payer: Self-pay | Admitting: Cardiovascular Disease

## 2019-06-30 ENCOUNTER — Ambulatory Visit (INDEPENDENT_AMBULATORY_CARE_PROVIDER_SITE_OTHER): Payer: Medicare Other | Admitting: Internal Medicine

## 2019-06-30 ENCOUNTER — Encounter: Payer: Self-pay | Admitting: Internal Medicine

## 2019-06-30 ENCOUNTER — Other Ambulatory Visit: Payer: Self-pay

## 2019-06-30 VITALS — BP 126/84 | HR 65 | Temp 98.3°F | Ht 66.8 in | Wt 214.8 lb

## 2019-06-30 DIAGNOSIS — Z794 Long term (current) use of insulin: Secondary | ICD-10-CM

## 2019-06-30 DIAGNOSIS — E6609 Other obesity due to excess calories: Secondary | ICD-10-CM

## 2019-06-30 DIAGNOSIS — R55 Syncope and collapse: Secondary | ICD-10-CM

## 2019-06-30 DIAGNOSIS — I130 Hypertensive heart and chronic kidney disease with heart failure and stage 1 through stage 4 chronic kidney disease, or unspecified chronic kidney disease: Secondary | ICD-10-CM

## 2019-06-30 DIAGNOSIS — N183 Chronic kidney disease, stage 3 unspecified: Secondary | ICD-10-CM | POA: Diagnosis not present

## 2019-06-30 DIAGNOSIS — E1122 Type 2 diabetes mellitus with diabetic chronic kidney disease: Secondary | ICD-10-CM | POA: Diagnosis not present

## 2019-06-30 DIAGNOSIS — I428 Other cardiomyopathies: Secondary | ICD-10-CM

## 2019-06-30 DIAGNOSIS — I480 Paroxysmal atrial fibrillation: Secondary | ICD-10-CM | POA: Diagnosis not present

## 2019-06-30 DIAGNOSIS — Z6833 Body mass index (BMI) 33.0-33.9, adult: Secondary | ICD-10-CM

## 2019-06-30 DIAGNOSIS — Z716 Tobacco abuse counseling: Secondary | ICD-10-CM

## 2019-06-30 NOTE — Progress Notes (Addendum)
This visit occurred during the SARS-CoV-2 public health emergency.  Safety protocols were in place, including screening questions prior to the visit, additional usage of staff PPE, and extensive cleaning of exam room while observing appropriate contact time as indicated for disinfecting solutions.  Subjective:     Patient ID: [name] , female    DOB: 03-May-1946 , 74 y.o.   MRN: [id number]   Chief Complaint  Patient presents with  . Diabetes  . Hypertension    HPI  She is here today for DM/HTN f/u.  She reports compliance with her meds. She states that she has had two episodes where she "blacked out".  She reports she fell out on [place]. She reports she was fixing something to eat around 2pm when she started to have tremors. She made it to the den, sat down and felt better after about 54mnutes. She went back to the kitchen to try to make something to eat, she started to have tremors again. She then tried to walk to her bedroom, but then fell between the kitchen and the dining room. She  [place] and hit her forehead and her knees. She did not call EMS or any other medical provider. She had a repeat episode two days later.   Diabetes She presents for her follow-up diabetic visit. She has type 2 diabetes mellitus. Her disease course has been stable. There are no hypoglycemic associated symptoms. Pertinent negatives for diabetes include no blurred vision. There are no hypoglycemic complications. Diabetic complications include nephropathy. Risk factors for coronary artery disease include diabetes mellitus, dyslipidemia, hypertension, obesity, post-menopausal and sedentary lifestyle. She participates in exercise intermittently.  Hypertension This is a chronic problem. The current episode started more than 1 year ago. The problem has been gradually improving since onset. The problem is controlled. Pertinent negatives include no blurred vision. Risk factors for coronary artery disease  include diabetes mellitus, dyslipidemia, obesity, post-menopausal state and sedentary lifestyle. The current treatment provides moderate improvement.     Past Medical History:  Diagnosis Date  . Arthritis    "left knee" (03/12/2013)  . Atrial fibrillation ([place]    Cardioverted to NSR 08/22/2009  . Cardiomyopathy   . CHF (congestive heart failure) ([place]   . CKD (chronic kidney disease)   . Depression   . Dizziness   . DM2 (diabetes mellitus, type 2) ([place]    fasting 80-100 (03/12/2013)  . Drug therapy 1/11   coumadin  . Ejection fraction < 50%    20-25%, echo.06/2009, /  EF 30-35%, diffuse hypokinesis, echo, Oct 16, 2010  . GERD (gastroesophageal reflux disease)   . Gout    "left knee" (03/12/2013)  . History of bronchitis   . History of emphysema   . HLD (hyperlipidemia)   . Hyperkalemia   . Hypertension   . LFT elevation    06/2009  . Mitral regurgitation    mild - echo - 1/11 /  Mild, echo, May15, 2012  . Overweight(278.02)   . Pneumonia    "@ least twice" (03/12/2013)  . Thyroid dysfunction    thyromegally,diffuse,nodule LLL,06/2009  . Tobacco abuse   . Tricuspid regurgitation    moderate - echo - 1/11     Family History  Problem Relation Age of Onset  . Stroke Mother   . Prostate cancer Father   . Colon cancer Other   . Heart disease Other   . Heart attack Son   . Hypertension Sister   . Hypertension Son  X2  . Stroke Maternal Grandfather   . Stroke Paternal Grandmother      Current Outpatient Medications:  .  acetaminophen (TYLENOL) 500 MG tablet, Take 1,000 mg by mouth 3 (three) times daily as needed for pain., Disp: , Rfl:  .  BD PEN NEEDLE NANO U/F 32G X 4 MM MISC, USE AS DIRECTED WITH INSULIN PENS, Disp: 100 each, Rfl: 11 .  Cholecalciferol (VITAMIN D) 2000 UNITS CAPS, Take 2,000 Units by mouth daily., Disp: , Rfl:  .  colchicine 0.6 MG tablet, Take 0.6 mg by mouth 2 (two) times daily as needed (GOUT). , Disp: , Rfl:  .  digoxin (LANOXIN) 0.125  MG tablet, Take 1 tablet (0.125 mg total) by mouth daily., Disp: 90 tablet, Rfl: 3 .  ELIQUIS 5 MG TABS tablet, TAKE 1 TABLET BY MOUTH TWICE A DAY, Disp: 180 tablet, Rfl: 1 .  fluocinonide cream (LIDEX) 0.05 %, Apply topically as needed (as directed for dry skin). , Disp: , Rfl: 0 .  fluticasone (FLONASE) 50 MCG/ACT nasal spray, Place 1 spray into both nostrils daily., Disp: 16 g, Rfl: 2 .  furosemide (LASIX) 40 MG tablet, TAKE 1 TABLET BY MOUITH DAILY AS NEEDED FOR EDEMA FOR FLUID OVERLOAD, Disp: 90 tablet, Rfl: 2 .  KLOR-CON M20 20 MEQ tablet, TAKE 1 TABLET (20 MEQ TOTAL) BY MOUTH DAILY. WHEN TAKING FUROSEMIDE (LASIX)., Disp: 90 tablet, Rfl: 2 .  liraglutide (VICTOZA) 18 MG/3ML SOPN, Inject 0.2 mLs (1.2 mg total) into the skin daily., Disp: 6 pen, Rfl: 2 .  metoprolol succinate (TOPROL-XL) 50 MG 24 hr tablet, Take 1 tablet (50 mg total) by mouth daily. Take with or immediately following a meal., Disp: 90 tablet, Rfl: 3 .  Multiple Vitamin (MULITIVITAMIN WITH MINERALS) TABS, Take 1 tablet by mouth daily. Centrum Silver, Disp: , Rfl:  .  mupirocin ointment (BACTROBAN) 2 %, Apply 1 application topically daily. , Disp: , Rfl:  .  ONETOUCH VERIO test strip, USE AS DIRECTED TO CHECK BLOOD SUGARS 1 TIME PER DAY DX: E11.22, Disp: 50 strip, Rfl: 7 .  pantoprazole (PROTONIX) 20 MG tablet, Take 20 mg by mouth 2 (two) times daily., Disp: , Rfl:  .  pravastatin (PRAVACHOL) 40 MG tablet, Take 1 tablet by mouth once per week, Disp: 15 tablet, Rfl: 11 .  vitamin C (ASCORBIC ACID) 500 MG tablet, Take 500 mg by mouth daily., Disp: , Rfl:  .  enalapril (VASOTEC) 5 MG tablet, Take 1 tablet (5 mg total) by mouth at bedtime. (Patient not taking: Reported on 06/30/2019), Disp: 30 tablet, Rfl: 1 .  Pitavastatin Calcium (LIVALO) 2 MG TABS, Take by mouth. Pt take 1 tablet M-W-F, Disp: , Rfl:    Allergies  Allergen Reactions  . Codeine Hives and Nausea And Vomiting  . Augmentin [Amoxicillin-Pot Clavulanate] Diarrhea  .  Warfarin And Related     Severe rectal bleeding     Review of Systems  Constitutional: Negative.   Eyes: Negative for blurred vision.  Respiratory: Negative.   Cardiovascular: Negative.   Gastrointestinal: Negative.   Neurological: Negative.   Psychiatric/Behavioral: Negative.      Today's Vitals   06/30/19 1458  BP: 126/84  Pulse: 65  Temp: 98.3 F (36.8 C)  TempSrc: Oral  Weight: 214 lb 12.8 oz (97.4 kg)  Height: 5' 6.8" (1.697 m)  PainSc: 5   PainLoc: Generalized   Body mass index is 33.84 kg/m.   Objective:  Physical Exam Vitals and nursing note reviewed.  Constitutional:  Appearance: Normal appearance. She is obese.  HENT:     Head: Normocephalic and atraumatic.  Cardiovascular:     Rate and Rhythm: Normal rate and regular rhythm.     Heart sounds: Normal heart sounds.  Pulmonary:     Effort: Pulmonary effort is normal.     Breath sounds: Normal breath sounds.  Skin:    General: Skin is warm.  Neurological:     General: No focal deficit present.     Mental Status: She is alert.  Psychiatric:        Mood and Affect: Mood normal.        Behavior: Behavior normal.         Assessment And Plan:     1. Type 2 diabetes mellitus with stage 3 chronic kidney disease, with long-term current use of insulin, unspecified whether stage 3a or 3b CKD ([facility])  Chronic, I will check labs as listed below. I will make necessary medication changes as appropriate.   - CMP14+EGFR - Hemoglobin A1c  2. Hypertensive heart and renal disease with renal failure, stage 1 through stage 4 or unspecified chronic kidney disease, with heart failure ([facility])  Chronic, well controlled.  She will continue with current meds.   3. Paroxysmal atrial fibrillation ([facility])  Chronic, pt in sinus rhythm today.   4. Syncope and collapse  EKG performed Undetermined rhythm w/ HR 94; incomplete LBBB. Old inferior infarct  Nonspecific ST depression; nonspecific T-abnormality   She is  encouraged to stay well hydrated. I will refer her to cardiology for further evaluation. Pt has been told she needed pacemaker in the past, advised she needs to consider moving forward with this suggestion.    - CBC no Diff - Ambulatory referral to Cardiology  5. NICM (nonischemic cardiomyopathy) ([place])  Chronic, she appears to be hemodynamically stable.   6. Class 1 obesity due to excess calories with serious comorbidity and body mass index (BMI) of 33.0 to 33.9 in adult  Wt Readings from Last 3 Encounters:  06/30/19 214 lb 12.8 oz (97.4 kg)  03/11/19 224 lb 6.4 oz (101.8 kg)  02/25/19 224 lb 12.8 oz (102 kg)   She was advised about her ten pound weight loss since October 2020.  While I agree she needs to get to BMI below 30 to decrease cardiac risk, she is encouraged to not starve herself during the day. I will consider weaning her off of the Victoza if she is unable to eat at least three small meals per day.   7. Encounter for counseling for tobacco use disorder  Smoking cessation instruction/counseling given:  counseled patient on the dangers of tobacco use, advised patient to stop smoking, and reviewed strategies to maximize success  Maximino Greenland, MD    THE PATIENT IS ENCOURAGED TO PRACTICE SOCIAL DISTANCING DUE TO THE COVID-19 PANDEMIC.

## 2019-06-30 NOTE — Patient Instructions (Signed)
     Syncope Syncope is when you pass out (faint) for a short time. It is caused by a sudden decrease in blood flow to the brain. Signs that you may be about to pass out include:  Feeling dizzy or light-headed.  Feeling sick to your stomach (nauseous).  Seeing all white or all black.  Having cold, clammy skin. If you pass out, get help right away. Call your local emergency services (911 in the [place] Do not drive yourself to the hospital. Follow these instructions at home: Watch for any changes in your symptoms. Take these actions to stay safe and help with your symptoms: Lifestyle  Do not drive, use machinery, or play sports until your doctor says it is okay.  Do not drink alcohol.  Do not use any products that contain nicotine or tobacco, such as cigarettes and e-cigarettes. If you need help quitting, ask your doctor.  Drink enough fluid to keep your pee (urine) pale yellow. General instructions  Take over-the-counter and prescription medicines only as told by your doctor.  If you are taking blood pressure or heart medicine, sit up and stand up slowly. Spend a few minutes getting ready to sit and then stand. This can help you feel less dizzy.  Have someone stay with you until you feel stable.  If you start to feel like you might pass out, lie down right away and raise (elevate) your feet above the level of your heart. Breathe deeply and steadily. Wait until all of the symptoms are gone.  Keep all follow-up visits as told by your doctor. This is important. Get help right away if:  You have a very bad headache.  You pass out once or more than once.  You have pain in your chest, belly, or back.  You have a very fast or uneven heartbeat (palpitations).  It hurts to breathe.  You are bleeding from your mouth or your bottom (rectum).  You have black or tarry poop (stool).  You have jerky movements that you cannot control (seizure).  You are confused.  You have  trouble walking.  You are very weak.  You have vision problems. These symptoms may be an emergency. Do not wait to see if the symptoms will go away. Get medical help right away. Call your local emergency services (911 in the [place] Do not drive yourself to the hospital. Summary  Syncope is when you pass out (faint) for a short time. It is caused by a sudden decrease in blood flow to the brain.  Signs that you may be about to faint include feeling dizzy, light-headed, or sick to your stomach, seeing all white or all black, or having cold, clammy skin.  If you start to feel like you might pass out, lie down right away and raise (elevate) your feet above the level of your heart. Breathe deeply and steadily. Wait until all of the symptoms are gone. This information is not intended to replace advice given to you by your health care provider. Make sure you discuss any questions you have with your health care provider. Document Revised: 07/02/2017 Document Reviewed: 07/02/2017 Elsevier Patient Education  2020 [employer] Inc.  

## 2019-07-01 LAB — CBC
Hematocrit: 38.9 % (ref 34.0–46.6)
Hemoglobin: 12.8 g/dL (ref 11.1–15.9)
MCH: 26.1 pg — ABNORMAL LOW (ref 26.6–33.0)
MCHC: 32.9 g/dL (ref 31.5–35.7)
MCV: 79 fL (ref 79–97)
Platelets: 243 10*3/uL (ref 150–450)
RBC: 4.9 x10E6/uL (ref 3.77–5.28)
RDW: 14.2 % (ref 11.7–15.4)
WBC: 9 10*3/uL (ref 3.4–10.8)

## 2019-07-01 LAB — CMP14+EGFR
ALT: 21 IU/L (ref 0–32)
AST: 22 IU/L (ref 0–40)
Albumin/Globulin Ratio: 1.6 (ref 1.2–2.2)
Albumin: 4.2 g/dL (ref 3.7–4.7)
Alkaline Phosphatase: 101 IU/L (ref 39–117)
BUN/Creatinine Ratio: 16 (ref 12–28)
BUN: 22 mg/dL (ref 8–27)
Bilirubin Total: 0.3 mg/dL (ref 0.0–1.2)
CO2: 25 mmol/L (ref 20–29)
Calcium: 9.9 mg/dL (ref 8.7–10.3)
Chloride: 99 mmol/L (ref 96–106)
Creatinine, Ser: 1.38 mg/dL — ABNORMAL HIGH (ref 0.57–1.00)
GFR calc Af Amer: 43 mL/min/{1.73_m2} — ABNORMAL LOW (ref >59)
GFR calc non Af Amer: 38 mL/min/{1.73_m2} — ABNORMAL LOW (ref >59)
Globulin, Total: 2.7 g/dL (ref 1.5–4.5)
Glucose: 79 mg/dL (ref 65–99)
Potassium: 4.3 mmol/L (ref 3.5–5.2)
Sodium: 138 mmol/L (ref 134–144)
Total Protein: 6.9 g/dL (ref 6.0–8.5)

## 2019-07-01 LAB — HEMOGLOBIN A1C
Est. average glucose Bld gHb Est-mCnc: 128 mg/dL
Hgb A1c MFr Bld: 6.1 % — ABNORMAL HIGH (ref 4.8–5.6)

## 2019-07-06 ENCOUNTER — Other Ambulatory Visit: Payer: Self-pay

## 2019-07-08 ENCOUNTER — Other Ambulatory Visit: Payer: Self-pay

## 2019-07-08 NOTE — Telephone Encounter (Signed)
Patient has appt 2/9

## 2019-07-08 NOTE — Patient Outreach (Signed)
[facility]) Care Management  07/08/2019  [name] 1946/02/23 [id number]   Medication Adherence call to [name] Compliant Voice message left with a call back number. [name] is showing due on Enalepril 5 mg under [place].   [facility] Management Direct Dial [phone]  Fax [phone] [email or web address]

## 2019-07-12 ENCOUNTER — Telehealth: Payer: Self-pay | Admitting: Cardiovascular Disease

## 2019-07-12 NOTE — Telephone Encounter (Signed)
Pt had two falls d/t hypoglycemia prior to seeing Dr. Baird Cancer on 1/27.  States she injured her ankle during one of these falls. States PCP told her to stop Victoza and has not had further issues with sugar.  Ankle and foot have continued to swell and skin on left foot is darker than right.  Very painful.  States PCP told her to go to [facility] but pt didn't because she was hoping it would get better.  Been using cane but [facility] nurse was out Friday and told her to start using walker due to foot.  Encouraged pt to go to Urgent Care like PCP told her to.  Dr. Sharol Given did knee replacement on pt and she wants to see if she can get an urgent appt with them first.  If unable to, she is going to go to [facility] on [facility] this evening. She prefers not to go to [facility].  She states PCP told her to get appt with Dr. [name] due to abnormal EKG.  EKG has not been scanned into system yet.  Pt couldn't schedule this week due to transportation issues.  She can't drive due to ankle, son is in [facility] and daughter in law does taxes so can't get out of work right now.  Pt plans to call the office back after getting her ankle checked out and set up appt with Dr. [name].  Will route to Dr. Blenda Mounts nurse to make her aware.

## 2019-07-12 NOTE — Telephone Encounter (Signed)
Dr [name] received EKG, called patient and scheduled telephone visit for Wednesday afternoon.

## 2019-07-12 NOTE — Telephone Encounter (Signed)
Pt c/o swelling: STAT is pt has developed SOB within 24 hours  1) How much weight have you gained and in what time span? 7 lbs 5 days  2) If swelling, where is the swelling located? Left foot   3) Are you currently taking a fluid pill? Yes   4) Are you currently SOB? No   5) Do you have a log of your daily weights (if so, list)? 07/07/19 212 lbs 07/12/19 219.4 lbs  6) Have you gained 3 pounds in a day or 5 pounds in a week? Yes, 7lbs in 5 days   7) Have you traveled recently? No  Patient is calling stating she fainted due to her blood sugar getting to low. She states when she fainted she hurt her left foot and has had excessive swelling and bruising ever since. She has not fainted since due to her doctor fixing her medication, but it is so hard for her to walk she had to cancel her appointment scheduled for 07/13/19. Please advise.

## 2019-07-13 ENCOUNTER — Ambulatory Visit: Payer: Medicare Other | Admitting: Cardiovascular Disease

## 2019-07-14 ENCOUNTER — Ambulatory Visit (INDEPENDENT_AMBULATORY_CARE_PROVIDER_SITE_OTHER): Payer: Medicare Other

## 2019-07-14 ENCOUNTER — Ambulatory Visit: Payer: Medicare Other | Admitting: Family

## 2019-07-14 ENCOUNTER — Encounter: Payer: Medicare Other | Admitting: Cardiovascular Disease

## 2019-07-14 ENCOUNTER — Encounter: Payer: Self-pay | Admitting: Family

## 2019-07-14 ENCOUNTER — Other Ambulatory Visit: Payer: Self-pay

## 2019-07-14 VITALS — Ht 66.0 in | Wt 214.0 lb

## 2019-07-14 DIAGNOSIS — S92302D Fracture of unspecified metatarsal bone(s), left foot, subsequent encounter for fracture with routine healing: Secondary | ICD-10-CM | POA: Diagnosis not present

## 2019-07-14 DIAGNOSIS — M79672 Pain in left foot: Secondary | ICD-10-CM

## 2019-07-14 NOTE — Progress Notes (Signed)
Office Visit Note   Patient: [name]           Date of Birth: March 19, 1946           MRN: [id number] Visit Date: 07/14/2019              Requested by: [name], [address] [place],  [place] [address] PCP: [name], MD  Chief Complaint  Patient presents with  . Left Foot - Pain    Fall x 2 weeks ago       HPI: The patient is a 74 year old woman who presents today with a complaint of pain to her left foot this is primarily over the dorsal lateral aspect she has pain that is worse with ambulation this is associated with swelling she has been unable to wear her shoes due to pain states that since the time of injury her swelling has improved very mildly her toes were initially quite swollen and painful.  About 2 weeks ago the patient had a fall she is unsure exactly how her foot got hung up underneath of her she has been having difficulty since.  Initially had some medical issues related to her fall her blood sugar was quite low and she has been dealing with these issues they are now under control and she is ready to treat her foot.  She has tried Voltaren gel with minimal relief she has been taking at 1000 mg of Tylenol every 6 hours with moderate help.  Assessment & Plan: Visit Diagnoses:  1. Pain in left foot     Plan: Place her in a postop shoe she may weight-bear as tolerated.  She will follow-up in the office in 2 weeks with repeat radiographs of the left foot.  Anticipate advancing her to a regular shoewear at that time discussed using compression garments for her swelling elevating for swelling offered pain medication patient would like to continue with Tylenol.  Follow-Up Instructions: No follow-ups on file.   Ortho Exam  Patient is alert, oriented, no adenopathy, well-dressed, normal affect, normal respiratory effort. Patient left the left lower extremity she has significant edema of the foot and ankle there is no discoloration no  erythema no warmth she does have some tenderness over the dorsum of her foot especially laterally.  There is no deformity. Has maximal tenderness to base of 3, 4, 5th metatarsal bases. Does have a palpable dp pulse.  Imaging: XR Foot 2 Views Left  Result Date: 07/14/2019 Radiographs of the left foot show some bony irregularity to the base the 3rd, 4th and 5th metatarsals.  No images are attached to the encounter.  Labs: Lab Results  Component Value Date   HGBA1C 6.1 (H) 06/30/2019   HGBA1C 6.2 (H) 02/25/2019   HGBA1C 6.0 (H) 12/01/2018   LABURIC 2.9 08/23/2011   REPTSTATUS 08/21/2011 FINAL 08/19/2011   GRAMSTAIN  08/19/2011    FEW WBC PRESENT,BOTH PMN AND MONONUCLEAR NO SQUAMOUS EPITHELIAL CELLS SEEN NO ORGANISMS SEEN   CULT  08/19/2011    FEW METHICILLIN RESISTANT STAPHYLOCOCCUS AUREUS Note: RIFAMPIN AND GENTAMICIN SHOULD NOT BE USED AS SINGLE DRUGS FOR TREATMENT OF STAPH INFECTIONS. This organism DOES NOT demonstrate inducible Clindamycin resistance in vitro. CRITICAL RESULT CALLED TO, READ BACK BY AND VERIFIED WITH: JACQUELINE  THIGPEN 08/21/11 0840 BY SMITHERSJ   LABORGA METHICILLIN RESISTANT STAPHYLOCOCCUS AUREUS 08/19/2011     Lab Results  Component Value Date   ALBUMIN 4.2 06/30/2019   ALBUMIN 4.4 02/25/2019  ALBUMIN 4.4 08/03/2018   LABURIC 2.9 08/23/2011    Lab Results  Component Value Date   MG 2.4 (H) 04/21/2018   MG 1.8 08/17/2011   No results found for: VD25OH  No results found for: PREALBUMIN CBC EXTENDED Latest Ref Rng & Units 06/30/2019 02/25/2019 04/21/2018  WBC 3.4 - 10.8 x10E3/uL 9.0 9.6 8.2  RBC 3.77 - 5.28 x10E6/uL 4.90 5.34(H) 5.25  HGB 11.1 - 15.9 g/dL 12.8 13.9 13.8  HCT 34.0 - 46.6 % 38.9 43.6 43.7  PLT 150 - 450 x10E3/uL 243 237 228  NEUTROABS 1.7 - 7.7 K/uL - - -  LYMPHSABS 0.7 - 4.0 K/uL - - -     Body mass index is 34.54 kg/m.  Orders:  Orders Placed This Encounter  Procedures  . XR Foot 2 Views Left   No orders of the defined  types were placed in this encounter.    Procedures: No procedures performed  Clinical Data: No additional findings.  ROS:  All other systems negative, except as noted in the HPI. Review of Systems  Objective: Vital Signs: Ht 5\' 6"  (1.676 m)   Wt 214 lb (97.1 kg)   BMI 34.54 kg/m   Specialty Comments:  No specialty comments available.  PMFS History: Patient Active Problem List   Diagnosis Date Noted  . Essential hypertension 11/12/2016  . Belching 08/26/2014  . Left bundle branch block (LBBB) 06/06/2014  . Lower GI bleeding 05/16/2013  . Lower GI bleed 05/16/2013  . Tobacco abuse   . Furunculosis of multiple sites 08/19/2011  . Rash, skin 08/19/2011  . DM (diabetes mellitus) type II uncontrolled with renal manifestation 08/17/2011  . Dehydration 08/17/2011  . Acute kidney injury ([facility]) 08/17/2011  . CKD (chronic kidney disease) 08/17/2011  . NICM (nonischemic cardiomyopathy) ([facility])   . Mitral regurgitation   . Paroxysmal atrial fibrillation (HCC)   . Tricuspid regurgitation   . Thyroid dysfunction   . LFT elevation   . Long term current use of anticoagulant 07/16/2010  . HYPERKALEMIA 08/15/2009  . OVERWEIGHT/OBESITY 08/15/2009  . DM (diabetes mellitus) ([facility]) 10/02/2007  . HYPERLIPIDEMIA 10/02/2007  . DEPRESSION 10/02/2007  . BRONCHITIS 10/02/2007  . EMPHYSEMA 10/02/2007  . GERD 10/02/2007  . BREAST BIOPSY, HX OF 10/02/2007   Past Medical History:  Diagnosis Date  . Arthritis    "left knee" (03/12/2013)  . Atrial fibrillation ([facility])    Cardioverted to NSR 08/22/2009  . Cardiomyopathy   . CHF (congestive heart failure) ([facility])   . CKD (chronic kidney disease)   . Depression   . Dizziness   . DM2 (diabetes mellitus, type 2) ([facility])    fasting 80-100 (03/12/2013)  . Drug therapy 1/11   coumadin  . Ejection fraction < 50%    20-25%, echo.06/2009, /  EF 30-35%, diffuse hypokinesis, echo, Oct 16, 2010  . GERD (gastroesophageal reflux disease)   . Gout    "left  knee" (03/12/2013)  . History of bronchitis   . History of emphysema   . HLD (hyperlipidemia)   . Hyperkalemia   . Hypertension   . LFT elevation    06/2009  . Mitral regurgitation    mild - echo - 1/11 /  Mild, echo, May15, 2012  . Overweight(278.02)   . Pneumonia    "@ least twice" (03/12/2013)  . Thyroid dysfunction    thyromegally,diffuse,nodule LLL,06/2009  . Tobacco abuse   . Tricuspid regurgitation    moderate - echo - 1/11    Family History  Problem  Relation Age of Onset  . Stroke Mother   . Prostate cancer Father   . Colon cancer Other   . Heart disease Other   . Heart attack Son   . Hypertension Sister   . Hypertension Son        [id number]  . Stroke Maternal Grandfather   . Stroke Paternal Grandmother     Past Surgical History:  Procedure Laterality Date  . BREAST BIOPSY Right 1980's  . BREAST LUMPECTOMY Right 1980's  . CATARACT EXTRACTION W/ INTRAOCULAR LENS  IMPLANT, BILATERAL Bilateral 2010-2012  . CHOLECYSTECTOMY  ~ 1978  . JOINT REPLACEMENT    . TONSILLECTOMY  1960's  . TOTAL KNEE ARTHROPLASTY Left 03/12/2013  . TOTAL KNEE ARTHROPLASTY Left 03/12/2013   Procedure: TOTAL KNEE ARTHROPLASTY;  Surgeon: Newt Minion, MD;  Location: [facility];  Service: Orthopedics;  Laterality: Left;  Left total knee arthroplasty  . TUBAL LIGATION    . VAGINAL HYSTERECTOMY  1987   Social History   Occupational History  . Occupation: retired  Tobacco Use  . Smoking status: Current Every Day Smoker    Packs/day: 0.50    Years: 40.00    Pack years: 20.00    Types: Cigarettes  . Smokeless tobacco: Never Used  . Tobacco comment: smoked 1.5ppd x 9, 1ppdx40, cut back to 1/2ppd past 5 years  Substance and Sexual Activity  . Alcohol use: No  . Drug use: No  . Sexual activity: Not Currently

## 2019-07-15 ENCOUNTER — Telehealth: Payer: Self-pay | Admitting: *Deleted

## 2019-07-15 ENCOUNTER — Other Ambulatory Visit: Payer: Self-pay | Admitting: Cardiovascular Disease

## 2019-07-15 NOTE — Telephone Encounter (Signed)
Rx has been sent to the pharmacy electronically. ° °

## 2019-07-15 NOTE — Telephone Encounter (Signed)
Spoke with patient and she was explaining to me why she could not make her virtual visit with Dr. [name] on 07/14/19. Patient was being seen for her foot at the same time as her virtual appointment with Dr. [name]. I told the patient that we were arware of her foot appointment and that I will check with Dr. [name] to see when we can get her rescheduled for another virtual visit.

## 2019-07-19 ENCOUNTER — Ambulatory Visit: Payer: Medicare Other | Attending: Internal Medicine

## 2019-07-19 DIAGNOSIS — Z23 Encounter for immunization: Secondary | ICD-10-CM | POA: Insufficient documentation

## 2019-07-19 NOTE — Progress Notes (Signed)
   Covid-19 Vaccination Clinic  Name:  UNA YEOMANS    MRN: [id number] DOB: Oct 16, 1945  07/19/2019  Ms. Parmar was observed post Covid-19 immunization for 15 minutes without incidence. She was provided with Vaccine Information Sheet and instruction to access the V-Safe system.   Ms. Jurich was instructed to call 911 with any severe reactions post vaccine: Marland Kitchen Difficulty breathing  . Swelling of your face and throat  . A fast heartbeat  . A bad rash all over your body  . Dizziness and weakness    Immunizations Administered    Name Date Dose VIS Date Route   Pfizer COVID-19 Vaccine 07/19/2019  3:26 PM 0.3 mL 05/14/2019 Intramuscular   Manufacturer: [place]   Lot: [id number]   [place]: [id number]

## 2019-07-26 ENCOUNTER — Telehealth (INDEPENDENT_AMBULATORY_CARE_PROVIDER_SITE_OTHER): Payer: Medicare Other | Admitting: Cardiovascular Disease

## 2019-07-26 ENCOUNTER — Encounter: Payer: Self-pay | Admitting: *Deleted

## 2019-07-26 ENCOUNTER — Encounter: Payer: Self-pay | Admitting: Cardiovascular Disease

## 2019-07-26 VITALS — BP 116/68 | HR 89 | Ht 67.0 in | Wt 220.5 lb

## 2019-07-26 DIAGNOSIS — E785 Hyperlipidemia, unspecified: Secondary | ICD-10-CM | POA: Diagnosis not present

## 2019-07-26 DIAGNOSIS — I480 Paroxysmal atrial fibrillation: Secondary | ICD-10-CM

## 2019-07-26 DIAGNOSIS — R55 Syncope and collapse: Secondary | ICD-10-CM | POA: Diagnosis not present

## 2019-07-26 DIAGNOSIS — I10 Essential (primary) hypertension: Secondary | ICD-10-CM

## 2019-07-26 DIAGNOSIS — I361 Nonrheumatic tricuspid (valve) insufficiency: Secondary | ICD-10-CM

## 2019-07-26 DIAGNOSIS — I5042 Chronic combined systolic (congestive) and diastolic (congestive) heart failure: Secondary | ICD-10-CM

## 2019-07-26 DIAGNOSIS — I4819 Other persistent atrial fibrillation: Secondary | ICD-10-CM

## 2019-07-26 DIAGNOSIS — I2510 Atherosclerotic heart disease of native coronary artery without angina pectoris: Secondary | ICD-10-CM | POA: Diagnosis not present

## 2019-07-26 DIAGNOSIS — I428 Other cardiomyopathies: Secondary | ICD-10-CM

## 2019-07-26 DIAGNOSIS — Z720 Tobacco use: Secondary | ICD-10-CM

## 2019-07-26 DIAGNOSIS — I447 Left bundle-branch block, unspecified: Secondary | ICD-10-CM

## 2019-07-26 DIAGNOSIS — I110 Hypertensive heart disease with heart failure: Secondary | ICD-10-CM

## 2019-07-26 DIAGNOSIS — I340 Nonrheumatic mitral (valve) insufficiency: Secondary | ICD-10-CM

## 2019-07-26 NOTE — Progress Notes (Signed)
Virtual Visit via Video Note   This visit type was conducted due to national recommendations for restrictions regarding the COVID-19 Pandemic (e.g. social distancing) in an effort to limit this patient's exposure and mitigate transmission in our community.  Due to her co-morbid illnesses, this patient is at least at moderate risk for complications without adequate follow up.  This format is felt to be most appropriate for this patient at this time.  All issues noted in this document were discussed and addressed.  A limited physical exam was performed with this format.  Please refer to the patient's chart for her consent to telehealth for [facility].   Evaluation Performed:  Follow-up visit  Date:  07/26/2019   ID:  Shelby, Little 1945-07-12, MRN [id number]  Patient Location: Home Provider Location: Office  PCP:  [name], MD  Cardiologist:  [name], MD  Electrophysiologist:  None   Chief Complaint:  Heart failure  History of Present Illness:    [name] is a 74 y.o. female with chronic systolic and diastolic heart failure (LVEF 25 to 30%, nonischemic), PAF, diabetes, hyperlipidemia, hypertension, and tobacco abuse here for follow up.  She was previously a patient of Dr. Ron Parker and then Dr. Irish Lack.  She was first diagnosed with heart failure 06/2009.  At that time her LVEF was 25%.  This was associated with atrial fibrillation.  She reports having an ablation with Dr. [name] in 1999, though those records are not available at this time.  She has undergone cardioversions for atrial fibrillation with Dr. Ron Parker.  LVEF has ranged from 25 to 35% but has not improved despite beta-blockers and ACE inhibitor.  She had an echo 11/2011 that revealed LVEF 50 to 55%.  However subsequent studies have ranged from 20-35%.  LVEF on Lexiscan Myoview 09/2014 was 41%.  She developed a left bundle branch block pattern in 2014.  Her cardiomyopathy is nonischemic.  She last saw Dr.  Irish Lack 04/2018 and carvedilol was increased due to concern for atrial tachycardia.  She felt fatigued and shaky at the higher dose.  Her ACE inhibitor was reduced 2/2 CKD III.  Prior to that she called the office 12/2017 with volume overload.  She previously discussed ICD with Dr. Irish Lack but was not interested.  We continued this discussion and she continues to want to think about it.  She was considering CRT-D after coronavirus.  Carvedilol was switched to metoprolol due to hypotension and tachycardia.  Her heart rate remained elevated but her energy levels improved.  Digoxin was started due to poor heart rate control and heart failure.    Since her last appointment [name] had an episode of syncope.  She started trembling and tried to get to a chair to rest.  She sat for about 20 minutes and then walked into another room.  She passed out and hit her face.  She had difficulty getting up off the floor due to weakness.  The next day the same thing happened.  Her blood glucose was in the 80s.  She did not feel lightheaded or palpitations.  When she was trying to get herself off the floor her heart was racing as she was struggling to get up.  She broke her foot with the fall.  The following day she had a recurrent episode of syncope the following day.  She notices that since starting Victoza her appetite has been poor.  She is eating once per day around 2 pm.  She  started back smoking but wants to quit again.  The patient does not have symptoms concerning for COVID-19 infection (fever, chills, cough, or new shortness of breath).    Past Medical History:  Diagnosis Date  . Arthritis    "left knee" (03/12/2013)  . Atrial fibrillation ([place])    Cardioverted to NSR 08/22/2009  . Cardiomyopathy   . CHF (congestive heart failure) ([place])   . CKD (chronic kidney disease)   . Depression   . Dizziness   . DM2 (diabetes mellitus, type 2) ([place])    fasting 80-100 (03/12/2013)  . Drug therapy 1/11    coumadin  . Ejection fraction < 50%    20-25%, echo.06/2009, /  EF 30-35%, diffuse hypokinesis, echo, Oct 16, 2010  . GERD (gastroesophageal reflux disease)   . Gout    "left knee" (03/12/2013)  . History of bronchitis   . History of emphysema   . HLD (hyperlipidemia)   . Hyperkalemia   . Hypertension   . LFT elevation    06/2009  . Mitral regurgitation    mild - echo - 1/11 /  Mild, echo, May15, 2012  . Overweight(278.02)   . Pneumonia    "@ least twice" (03/12/2013)  . Thyroid dysfunction    thyromegally,diffuse,nodule LLL,06/2009  . Tobacco abuse   . Tricuspid regurgitation    moderate - echo - 1/11   Past Surgical History:  Procedure Laterality Date  . BREAST BIOPSY Right 1980's  . BREAST LUMPECTOMY Right 1980's  . CATARACT EXTRACTION W/ INTRAOCULAR LENS  IMPLANT, BILATERAL Bilateral 2010-2012  . CHOLECYSTECTOMY  ~ 1978  . JOINT REPLACEMENT    . TONSILLECTOMY  1960's  . TOTAL KNEE ARTHROPLASTY Left 03/12/2013  . TOTAL KNEE ARTHROPLASTY Left 03/12/2013   Procedure: TOTAL KNEE ARTHROPLASTY;  Surgeon: Newt Minion, MD;  Location: [facility];  Service: Orthopedics;  Laterality: Left;  Left total knee arthroplasty  . TUBAL LIGATION    . VAGINAL HYSTERECTOMY  1987     Current Meds  Medication Sig  . acetaminophen (TYLENOL) 500 MG tablet Take 1,000 mg by mouth 3 (three) times daily as needed for pain.  . BD PEN NEEDLE NANO U/F 32G X 4 MM MISC USE AS DIRECTED WITH INSULIN PENS  . Cholecalciferol (VITAMIN D) 2000 UNITS CAPS Take 2,000 Units by mouth daily.  . colchicine 0.6 MG tablet Take 0.6 mg by mouth 2 (two) times daily as needed (GOUT).   Marland Kitchen digoxin (LANOXIN) 0.125 MG tablet Take 1 tablet (0.125 mg total) by mouth daily.  Marland Kitchen ELIQUIS 5 MG TABS tablet TAKE 1 TABLET BY MOUTH TWICE A DAY  . enalapril (VASOTEC) 5 MG tablet TAKE 1 TABLET BY MOUTH EVERYDAY AT BEDTIME  . fluocinonide cream (LIDEX) 0.05 % Apply topically as needed (as directed for dry skin).   . fluticasone (FLONASE)  50 MCG/ACT nasal spray Place 1 spray into both nostrils daily.  . furosemide (LASIX) 40 MG tablet TAKE 1 TABLET BY MOUITH DAILY AS NEEDED FOR EDEMA FOR FLUID OVERLOAD  . liraglutide (VICTOZA) 18 MG/3ML SOPN Inject 0.2 mLs (1.2 mg total) into the skin daily. (Patient taking differently: Inject 0.6 mg into the skin daily. )  . Multiple Vitamin (MULITIVITAMIN WITH MINERALS) TABS Take 1 tablet by mouth daily. Centrum Silver  . mupirocin ointment (BACTROBAN) 2 % Apply 1 application topically daily.   [name] VERIO test strip USE AS DIRECTED TO CHECK BLOOD SUGARS 1 TIME PER DAY DX: E11.22  . pantoprazole (PROTONIX) 20 MG tablet  Take 20 mg by mouth 2 (two) times daily.  . Pitavastatin Calcium (LIVALO) 2 MG TABS Take by mouth. Pt take 1 tablet M-W-F  . pravastatin (PRAVACHOL) 40 MG tablet Take 1 tablet by mouth once per week  . vitamin C (ASCORBIC ACID) 500 MG tablet Take 500 mg by mouth daily.     Allergies:   Codeine, Augmentin [amoxicillin-pot clavulanate], Coreg [carvedilol], and Warfarin and related   Social History   Tobacco Use  . Smoking status: Current Every Day Smoker    Packs/day: 0.50    Years: 40.00    Pack years: 20.00    Types: Cigarettes  . Smokeless tobacco: Never Used  . Tobacco comment: smoked 1.5ppd x 9, 1ppdx40, cut back to 1/2ppd past 5 years  Substance Use Topics  . Alcohol use: No  . Drug use: No     Family Hx: The patient's family history includes Colon cancer in an other family member; Heart attack in her son; Heart disease in an other family member; Hypertension in her sister and son; Prostate cancer in her father; Stroke in her maternal grandfather, mother, and paternal grandmother.  ROS:   Please see the history of present illness.     All other systems reviewed and are negative.   Prior CV studies:   The following studies were reviewed today:  Echo 07/28/15: Study Conclusions  - Left ventricle: The cavity size was normal. There was moderate    concentric hypertrophy. Systolic function was severely reduced.   The estimated ejection fraction was in the range of 25% to 30%.   Diffuse hypokinesis. Features are consistent with a pseudonormal   left ventricular filling pattern, with concomitant abnormal   relaxation and increased filling pressure (grade 2 diastolic   dysfunction). Doppler parameters are consistent with high   ventricular filling pressure. - Aortic valve: Transvalvular velocity was within the normal range.   There was no stenosis. There was no regurgitation. - Mitral valve: Calcified annulus. Mildly thickened leaflets .   There was trivial regurgitation. - Left atrium: The atrium was mildly dilated. - Right ventricle: The cavity size was normal. Wall thickness was   normal. Systolic function was normal. - Atrial septum: A patent foramen ovale cannot be excluded. - Tricuspid valve: There was mild regurgitation. Peak RV-RA   gradient (S): 30 mm Hg. - Inferior vena cava: The vessel was normal in size. The   respirophasic diameter changes were in the normal range (= 50%),   consistent with normal central venous pressure.  Lexiscan Myoview 09/08/14: LVEF 41%.  No ischemia.   Labs/Other Tests and Data Reviewed:    EKG:  An ECG dated 04/22/19 was personally reviewed today and demonstrated:  Ectopic atrial rhythm.  Rate 95 bpm.  PVCs.  IVCD.  QRS 130 ms.   Recent Labs: 12/01/2018: BNP 75.1; TSH 1.050 06/30/2019: ALT 21; BUN 22; Creatinine, Ser 1.38; Hemoglobin 12.8; Platelets 243; Potassium 4.3; Sodium 138   Recent Lipid Panel Lab Results  Component Value Date/Time   CHOL 134 02/25/2019 04:47 PM   TRIG 142 02/25/2019 04:47 PM   HDL 37 (L) 02/25/2019 04:47 PM   CHOLHDL 3.6 02/25/2019 04:47 PM   CHOLHDL 4.8 06/27/2009 03:28 AM   LDLCALC 72 02/25/2019 04:47 PM    Wt Readings from Last 3 Encounters:  07/26/19 220 lb 8 oz (100 kg)  07/14/19 214 lb (97.1 kg)  06/30/19 214 lb 12.8 oz (97.4 kg)     Objective:    BP  116/68  Pulse 89   Ht 5\' 7"  (1.702 m)   Wt 220 lb 8 oz (100 kg)   BMI 34.54 kg/m  GENERAL: Sounds well.   RESP: No respiratory distress NEURO:  Speech fluent.   PSYCH:  Cognitively intact, oriented to person place and time   ASSESSMENT & PLAN:    # Syncope:  Unclear what caused the episode.  She has not been eating well.  It could have been due to orthostasis or hypoglycemia.  Cannot rule out arrhythmias.  She is OK with getting ICD. She wants to get her second COVID-19 vaccine and then will get ICD.  Check 14 day event monitor.  # Chronic systolic and diastolic heart failure: NICM.  She is euvolemic.  Echo 02/2019 revealed LVEF 30-35%.  She is agreeable to getting CRT-D after her second COVID vaccine.  Will plan for early April.  Continue metoprolol succinate, digoxin, and enalapril.  BP is low so we will not add Entresto or spironolactone.  # Asymptomatic CAD: Noted on chest CT.  Continue pitavastatin. LDL goal <70  # Hyperlipidemia: [name] is on pitavastatin 2mg  daily.  LDL was 72 pm 02/2019  # Persistent atrial fibrillation: Continue metoprolol, digoxin and Eliquis.  # Tobacco abuse: Wants to cut back again.   COVID-19 Education: The signs and symptoms of COVID-19 were discussed with the patient and how to seek care for testing (follow up with PCP or arrange E-visit).  The importance of social distancing was discussed today.  Time:   Today, I have spent 30 minutes with the patient with telehealth technology discussing the above problems.     Medication Adjustments/Labs and Tests Ordered: Current medicines are reviewed at length with the patient today.  Concerns regarding medicines are outlined above.   Tests Ordered: No orders of the defined types were placed in this encounter.   Medication Changes: No orders of the defined types were placed in this encounter.   Disposition:  Follow up in 4 month(s)  Signed, [name], MD  07/26/2019 8:28 AM     [place]

## 2019-07-26 NOTE — Addendum Note (Signed)
Addended by: Alvina Filbert B on: 07/26/2019 10:04 AM   Modules accepted: Orders

## 2019-07-26 NOTE — Patient Instructions (Signed)
Medication Instructions:  Your physician recommends that you continue on your current medications as directed. Please refer to the Current Medication list given to you today.  *If you need a refill on your cardiac medications before your next appointment, please call your pharmacy*  Lab Work: NONE   Testing/Procedures: 14 day event monitor  THIS WILL BE MAILED TO YOU   Follow-Up: At [facility], you and your health needs are our priority.  As part of our continuing mission to provide you with exceptional heart care, we have created designated Provider Care Teams.  These Care Teams include your primary Cardiologist (physician) and Advanced Practice Providers (APPs -  Physician Assistants and Nurse Practitioners) who all work together to provide you with the care you need, when you need it.  Your next appointment:   4 month(s) You will receive a reminder letter in the mail two months in advance. If you don't receive a letter, please call our office to schedule the follow-up appointment.   The format for your next appointment:   In Person  Provider:   You may see [name], MD or one of the following Advanced Practice Providers on your designated Care Team:    Kerin Ransom, PA-C  Brenda, [place]  Coletta Memos, [place]   You have been referred to ELECTROPHYSIOLOGIST AT [place] [address] [place]

## 2019-07-26 NOTE — Progress Notes (Signed)
Patient ID: [name], female   DOB: Mar 28, 1946, 74 y.o.   MRN: [id number] Patient enrolled for [employer] to ship a 14 day cardiac event monitor to her home.  Instructions sent via My Chart message.

## 2019-07-28 ENCOUNTER — Other Ambulatory Visit: Payer: Self-pay

## 2019-07-28 ENCOUNTER — Ambulatory Visit: Payer: Medicare Other | Admitting: Family

## 2019-08-02 NOTE — Progress Notes (Signed)
This encounter was created in error - please disregard.

## 2019-08-03 ENCOUNTER — Encounter: Payer: Self-pay | Admitting: Family

## 2019-08-03 ENCOUNTER — Ambulatory Visit: Payer: Self-pay

## 2019-08-03 ENCOUNTER — Ambulatory Visit: Payer: Medicare Other | Admitting: Family

## 2019-08-03 ENCOUNTER — Other Ambulatory Visit: Payer: Self-pay

## 2019-08-03 VITALS — Ht 67.0 in | Wt 220.0 lb

## 2019-08-03 DIAGNOSIS — M6702 Short Achilles tendon (acquired), left ankle: Secondary | ICD-10-CM

## 2019-08-03 DIAGNOSIS — M79672 Pain in left foot: Secondary | ICD-10-CM | POA: Diagnosis not present

## 2019-08-04 ENCOUNTER — Encounter: Payer: Self-pay | Admitting: Family

## 2019-08-04 NOTE — Progress Notes (Signed)
Office Visit Note   Patient: [name]           Date of Birth: 06-11-1945           MRN: [id number] Visit Date: 08/03/2019              Requested by: [name], [place] [address] [place],  [place] [address] PCP: [name], MD  Chief Complaint  Patient presents with  . Left Foot - Follow-up      HPI: Patient is a 74 year old woman who states that she fell in January approximately 2 months ago.  She states she did have swelling but the swelling has resolved she has pain across the midfoot with ambulation.  She states she feels like she is a little bit better she is currently in a postoperative shoe for weightbearing.  Assessment & Plan: Visit Diagnoses:  1. Pain in left foot     Plan: Recommended Achilles stretching for her Achilles contracture continue with the postoperative shoe reevaluate in 4 weeks for repeat three-view radiographs of the left foot to evaluate the Lisfranc complex.  Follow-Up Instructions: Return in about 4 weeks (around 08/31/2019).   Ortho Exam  Patient is alert, oriented, no adenopathy, well-dressed, normal affect, normal respiratory effort. Examination patient has a good dorsalis pedis pulse she is tender across the Lisfranc complex.  The forefoot is nontender to palpation the hindfoot is nontender to palpation she has good ankle good subtalar motion.  She does have Achilles contracture with dorsiflexion to neutral with the knee extended.  Imaging: No results found. No images are attached to the encounter.  Labs: Lab Results  Component Value Date   HGBA1C 6.1 (H) 06/30/2019   HGBA1C 6.2 (H) 02/25/2019   HGBA1C 6.0 (H) 12/01/2018   LABURIC 2.9 08/23/2011   REPTSTATUS 08/21/2011 FINAL 08/19/2011   GRAMSTAIN  08/19/2011    FEW WBC PRESENT,BOTH PMN AND MONONUCLEAR NO SQUAMOUS EPITHELIAL CELLS SEEN NO ORGANISMS SEEN   CULT  08/19/2011    FEW METHICILLIN RESISTANT STAPHYLOCOCCUS AUREUS Note: RIFAMPIN AND GENTAMICIN  SHOULD NOT BE USED AS SINGLE DRUGS FOR TREATMENT OF STAPH INFECTIONS. This organism DOES NOT demonstrate inducible Clindamycin resistance in vitro. CRITICAL RESULT CALLED TO, READ BACK BY AND VERIFIED WITH: JACQUELINE  THIGPEN 08/21/11 0840 BY SMITHERSJ   LABORGA METHICILLIN RESISTANT STAPHYLOCOCCUS AUREUS 08/19/2011     Lab Results  Component Value Date   ALBUMIN 4.2 06/30/2019   ALBUMIN 4.4 02/25/2019   ALBUMIN 4.4 08/03/2018   LABURIC 2.9 08/23/2011    Lab Results  Component Value Date   MG 2.4 (H) 04/21/2018   MG 1.8 08/17/2011   No results found for: VD25OH  No results found for: PREALBUMIN CBC EXTENDED Latest Ref Rng & Units 06/30/2019 02/25/2019 04/21/2018  WBC 3.4 - 10.8 x10E3/uL 9.0 9.6 8.2  RBC 3.77 - 5.28 x10E6/uL 4.90 5.34(H) 5.25  HGB 11.1 - 15.9 g/dL 12.8 13.9 13.8  HCT 34.0 - 46.6 % 38.9 43.6 43.7  PLT 150 - 450 x10E3/uL 243 237 228  NEUTROABS 1.7 - 7.7 K/uL - - -  LYMPHSABS 0.7 - 4.0 K/uL - - -     Body mass index is 34.46 kg/m.  Orders:  Orders Placed This Encounter  Procedures  . XR Foot 2 Views Left   No orders of the defined types were placed in this encounter.    Procedures: No procedures performed  Clinical Data: No additional findings.  ROS:  All other systems negative, except  as noted in the HPI. Review of Systems  Objective: Vital Signs: Ht 5\' 7"  (1.702 m)   Wt 220 lb (99.8 kg)   BMI 34.46 kg/m   Specialty Comments:  No specialty comments available.  PMFS History: Patient Active Problem List   Diagnosis Date Noted  . Syncope and collapse 07/26/2019  . Essential hypertension 11/12/2016  . Belching 08/26/2014  . Left bundle branch block (LBBB) 06/06/2014  . Lower GI bleeding 05/16/2013  . Lower GI bleed 05/16/2013  . Tobacco abuse   . Furunculosis of multiple sites 08/19/2011  . Rash, skin 08/19/2011  . DM (diabetes mellitus) type II uncontrolled with renal manifestation 08/17/2011  . Dehydration 08/17/2011  . Acute  kidney injury ([place]) 08/17/2011  . CKD (chronic kidney disease) 08/17/2011  . NICM (nonischemic cardiomyopathy) ([place])   . Mitral regurgitation   . Paroxysmal atrial fibrillation ([facility])   . Tricuspid regurgitation   . Thyroid dysfunction   . LFT elevation   . Long term current use of anticoagulant 07/16/2010  . HYPERKALEMIA 08/15/2009  . OVERWEIGHT/OBESITY 08/15/2009  . DM (diabetes mellitus) ([place]) 10/02/2007  . HYPERLIPIDEMIA 10/02/2007  . DEPRESSION 10/02/2007  . BRONCHITIS 10/02/2007  . EMPHYSEMA 10/02/2007  . GERD 10/02/2007  . BREAST BIOPSY, HX OF 10/02/2007   Past Medical History:  Diagnosis Date  . Arthritis    "left knee" (03/12/2013)  . Atrial fibrillation ([place])    Cardioverted to NSR 08/22/2009  . Cardiomyopathy   . CHF (congestive heart failure) ([place])   . CKD (chronic kidney disease)   . Depression   . Dizziness   . DM2 (diabetes mellitus, type 2) ([place])    fasting 80-100 (03/12/2013)  . Drug therapy 1/11   coumadin  . Ejection fraction < 50%    20-25%, echo.06/2009, /  EF 30-35%, diffuse hypokinesis, echo, Oct 16, 2010  . GERD (gastroesophageal reflux disease)   . Gout    "left knee" (03/12/2013)  . History of bronchitis   . History of emphysema   . HLD (hyperlipidemia)   . Hyperkalemia   . Hypertension   . LFT elevation    06/2009  . Mitral regurgitation    mild - echo - 1/11 /  Mild, echo, May15, 2012  . Overweight(278.02)   . Pneumonia    "@ least twice" (03/12/2013)  . Thyroid dysfunction    thyromegally,diffuse,nodule LLL,06/2009  . Tobacco abuse   . Tricuspid regurgitation    moderate - echo - 1/11    Family History  Problem Relation Age of Onset  . Stroke Mother   . Prostate cancer Father   . Colon cancer Other   . Heart disease Other   . Heart attack Son   . Hypertension Sister   . Hypertension Son        X3  . Stroke Maternal Grandfather   . Stroke Paternal Grandmother     Past Surgical History:  Procedure Laterality Date  . BREAST  BIOPSY Right 1980's  . BREAST LUMPECTOMY Right 1980's  . CATARACT EXTRACTION W/ INTRAOCULAR LENS  IMPLANT, BILATERAL Bilateral 2010-2012  . CHOLECYSTECTOMY  ~ 1978  . JOINT REPLACEMENT    . TONSILLECTOMY  1960's  . TOTAL KNEE ARTHROPLASTY Left 03/12/2013  . TOTAL KNEE ARTHROPLASTY Left 03/12/2013   Procedure: TOTAL KNEE ARTHROPLASTY;  Surgeon: Newt Minion, MD;  Location: [facility];  Service: Orthopedics;  Laterality: Left;  Left total knee arthroplasty  . TUBAL LIGATION    . [place]  Social History   Occupational History  . Occupation: retired  Tobacco Use  . Smoking status: Current Every Day Smoker    Packs/day: 0.50    Years: 40.00    Pack years: 20.00    Types: Cigarettes  . Smokeless tobacco: Never Used  . Tobacco comment: smoked 1.5ppd x 9, 1ppdx40, cut back to 1/2ppd past 5 years  Substance and Sexual Activity  . Alcohol use: No  . Drug use: No  . Sexual activity: Not Currently

## 2019-08-10 ENCOUNTER — Ambulatory Visit: Payer: Medicare Other | Attending: Internal Medicine

## 2019-08-10 DIAGNOSIS — Z23 Encounter for immunization: Secondary | ICD-10-CM | POA: Insufficient documentation

## 2019-08-10 NOTE — Progress Notes (Signed)
   Covid-19 Vaccination Clinic  Name:  [name]    MRN: [id number] DOB: 04-Jan-1946  08/10/2019  [name] was observed post Covid-19 immunization for 15 minutes without incident. She was provided with Vaccine Information Sheet and instruction to access the V-Safe system.   [name] was instructed to call 911 with any severe reactions post vaccine: Marland Kitchen Difficulty breathing  . Swelling of face and throat  . A fast heartbeat  . A bad rash all over body  . Dizziness and weakness   Immunizations Administered    Name Date Dose VIS Date Route   Pfizer COVID-19 Vaccine 08/10/2019 12:45 PM 0.3 mL 05/14/2019 Intramuscular   Manufacturer: [place]   Lot: [id number]   [place]: [address]-9

## 2019-08-11 ENCOUNTER — Ambulatory Visit: Payer: Medicare Other

## 2019-08-12 ENCOUNTER — Ambulatory Visit (INDEPENDENT_AMBULATORY_CARE_PROVIDER_SITE_OTHER): Payer: Medicare Other

## 2019-08-12 DIAGNOSIS — R55 Syncope and collapse: Secondary | ICD-10-CM

## 2019-08-13 ENCOUNTER — Telehealth: Payer: Self-pay | Admitting: Internal Medicine

## 2019-08-13 ENCOUNTER — Telehealth: Payer: Self-pay

## 2019-08-13 NOTE — Telephone Encounter (Signed)
Dr [name] reviewed strips received from [employer], no changes and continue to monitor

## 2019-08-13 NOTE — Telephone Encounter (Signed)
Received fax from [employer]. Pt in A.fib - according to previous note - pt was asymptomatic. Gave results to Dr. [name] in office.

## 2019-08-13 NOTE — Telephone Encounter (Signed)
Cardiology Moon[facility]ighter Note  Returned page from [employer]. Patient had first documented episode of AF. Patient asymptomatic. Rate controlled. Will cc ordering provider.   Marcie Mowers, MD Cardiology Fellow, PGY-7

## 2019-08-16 ENCOUNTER — Telehealth: Payer: Self-pay | Admitting: *Deleted

## 2019-08-16 NOTE — Telephone Encounter (Signed)
Alert came in that the patient had an episode of atrial fibrillation. This is the second occurrence since the monitor has been placed.   The patient stated that she felt fine and did not know when the episode took place. She denies shortness of breath and chest pain.  DOD made aware. No changes. Results put in the ordering provider's box.

## 2019-08-17 ENCOUNTER — Telehealth: Payer: Self-pay | Admitting: *Deleted

## 2019-08-17 NOTE — Telephone Encounter (Signed)
Discussed with Dr Ellyn Hack pt to continue to monitor no changes at this time ./cy

## 2019-08-17 NOTE — Telephone Encounter (Signed)
[employer] sent 2 serious events from 08/16/19 11:11 AM and 11:16 am showing a fib Sustained w/Artifact and a fib sustained . Discussed with pt and felt shaky both times the patient is currently taking Eliquis ./cy

## 2019-08-18 NOTE — Telephone Encounter (Signed)
This patient has known atrial fibrillation.  She should not be called for atrial fibrillation.  Can this alert be removed?

## 2019-08-18 NOTE — Telephone Encounter (Signed)
Spoke with Florentina Jenny and will have alerts removed for afib unless rate is greater than 120 ./cy

## 2019-08-25 ENCOUNTER — Encounter: Payer: Self-pay | Admitting: Cardiovascular Disease

## 2019-08-31 ENCOUNTER — Ambulatory Visit: Payer: Medicare Other | Admitting: Orthopedic Surgery

## 2019-09-07 ENCOUNTER — Encounter: Payer: Self-pay | Admitting: Internal Medicine

## 2019-09-07 ENCOUNTER — Telehealth: Payer: Self-pay | Admitting: Radiology

## 2019-09-07 ENCOUNTER — Ambulatory Visit: Payer: Medicare Other | Admitting: Internal Medicine

## 2019-09-07 ENCOUNTER — Other Ambulatory Visit: Payer: Self-pay

## 2019-09-07 VITALS — BP 110/82 | HR 91 | Ht 67.0 in | Wt 230.0 lb

## 2019-09-07 DIAGNOSIS — I428 Other cardiomyopathies: Secondary | ICD-10-CM

## 2019-09-07 DIAGNOSIS — R55 Syncope and collapse: Secondary | ICD-10-CM

## 2019-09-07 DIAGNOSIS — I480 Paroxysmal atrial fibrillation: Secondary | ICD-10-CM | POA: Diagnosis not present

## 2019-09-07 NOTE — Progress Notes (Signed)
ELECTROPHYSIOLOGY CONSULT NOTE  Patient ID: [name], MRN: [id number], DOB/AGE: 75-Jan-1947 75 y.o. Admit date: (Not on file) Date of Consult: 09/07/2019  Primary Physician: [name], MD Primary Cardiologist: TR     [name] is a 74 y.o. female who is being seen today for the evaluation of  ICD at the request of TR   HPI [name] is a 74 y.o. female referred for consideration of an ICD.  Previously followed by Drs. Ron Parker and JV.  Remote history of catheter ablation by me 20+ years ago.  Also remote history of atrial fibrillation for which she underwent cardioversion years ago.  Some interval palpitations.  Shortness of breath with exertion.  Lightheadedness mostly positional and improved with protracted standing has limited medications for her cardiomyopathy and rate.  Recently dig was added.  She has had recurrent episodes of syncope and presyncope.  These are all associated with a typical prodrome brought on by standing and or walking.  Typically there is some shaking and some lightheadedness.  The prodrome lasts minutes.  Standing quickly on one occasion recently she has fallen which she has not heated the warning.  These seem to have gotten more problematic following adjustments in her antihypertensives.  With recent change of her beta-blockers from carvedilol to metoprolol she has felt some better.  She has shower intolerance.  No chest pain.  No edema.  DATE TEST EF   4/16 Myoview   41 % No Ischemia  2/17 Echo   25-30 %   9/20 Echo  30-35%     Date Cr K Hgb  1/21 1.38 4.3 12.8            Past Medical History:  Diagnosis Date  . Arthritis    "left knee" (03/12/2013)  . Atrial fibrillation ([place])    Cardioverted to NSR 08/22/2009  . Cardiomyopathy   . CHF (congestive heart failure) ([place])   . CKD (chronic kidney disease)   . Depression   . Dizziness   . DM2 (diabetes mellitus, type 2) ([place])    fasting 80-100 (03/12/2013)  . Drug  therapy 1/11   coumadin  . Ejection fraction < 50%    20-25%, echo.06/2009, /  EF 30-35%, diffuse hypokinesis, echo, Oct 16, 2010  . GERD (gastroesophageal reflux disease)   . Gout    "left knee" (03/12/2013)  . History of bronchitis   . History of emphysema   . HLD (hyperlipidemia)   . Hyperkalemia   . Hypertension   . LFT elevation    06/2009  . Mitral regurgitation    mild - echo - 1/11 /  Mild, echo, May15, 2012  . Overweight(278.02)   . Pneumonia    "@ least twice" (03/12/2013)  . Thyroid dysfunction    thyromegally,diffuse,nodule LLL,06/2009  . Tobacco abuse   . Tricuspid regurgitation    moderate - echo - 1/11      Surgical History:  Past Surgical History:  Procedure Laterality Date  . BREAST BIOPSY Right 1980's  . BREAST LUMPECTOMY Right 1980's  . CATARACT EXTRACTION W/ INTRAOCULAR LENS  IMPLANT, BILATERAL Bilateral 2010-2012  . CHOLECYSTECTOMY  ~ 1978  . JOINT REPLACEMENT    . TONSILLECTOMY  1960's  . TOTAL KNEE ARTHROPLASTY Left 03/12/2013  . TOTAL KNEE ARTHROPLASTY Left 03/12/2013   Procedure: TOTAL KNEE ARTHROPLASTY;  Surgeon: Newt Minion, MD;  Location: [facility];  Service: Orthopedics;  Laterality: Left;  Left total knee arthroplasty  .  TUBAL LIGATION    . VAGINAL HYSTERECTOMY  1987     Home Meds: Current Meds  Medication Sig  . acetaminophen (TYLENOL) 500 MG tablet Take 1,000 mg by mouth 3 (three) times daily as needed for pain.  . BD PEN NEEDLE NANO U/F 32G X 4 MM MISC USE AS DIRECTED WITH INSULIN PENS  . Cholecalciferol (VITAMIN D) 2000 UNITS CAPS Take 2,000 Units by mouth daily.  . colchicine 0.6 MG tablet Take 0.6 mg by mouth 2 (two) times daily as needed (GOUT).   Marland Kitchen digoxin (LANOXIN) 0.125 MG tablet Take 1 tablet (0.125 mg total) by mouth daily.  Marland Kitchen ELIQUIS 5 MG TABS tablet TAKE 1 TABLET BY MOUTH TWICE A DAY  . enalapril (VASOTEC) 5 MG tablet TAKE 1 TABLET BY MOUTH EVERYDAY AT BEDTIME  . fluocinonide cream (LIDEX) 0.05 % Apply topically as needed (as  directed for dry skin).   . furosemide (LASIX) 40 MG tablet TAKE 1 TABLET BY MOUITH DAILY AS NEEDED FOR EDEMA FOR FLUID OVERLOAD  . liraglutide (VICTOZA) 18 MG/3ML SOPN Inject 0.2 mLs (1.2 mg total) into the skin daily. (Patient taking differently: Inject 0.6 mg into the skin daily. )  . Multiple Vitamin (MULITIVITAMIN WITH MINERALS) TABS Take 1 tablet by mouth daily. Centrum Silver  . mupirocin ointment (BACTROBAN) 2 % Apply 1 application topically daily.   [name] VERIO test strip USE AS DIRECTED TO CHECK BLOOD SUGARS 1 TIME PER DAY DX: E11.22  . pantoprazole (PROTONIX) 20 MG tablet Take 20 mg by mouth 2 (two) times daily.  . Pitavastatin Calcium (LIVALO) 2 MG TABS Take by mouth. Pt take 1 tablet M-W-F  . pravastatin (PRAVACHOL) 40 MG tablet Take 1 tablet by mouth once per week  . vitamin C (ASCORBIC ACID) 500 MG tablet Take 500 mg by mouth daily.    Allergies:  Allergies  Allergen Reactions  . Codeine Hives and Nausea And Vomiting  . Augmentin [Amoxicillin-Pot Clavulanate] Diarrhea  . Coreg [Carvedilol]     No energy  . Warfarin And Related     Severe rectal bleeding    Social History   Socioeconomic History  . Marital status: Divorced    Spouse name: Not on file  . Number of children: Not on file  . Years of education: Not on file  . Highest education level: Not on file  Occupational History  . Occupation: retired  Tobacco Use  . Smoking status: Current Every Day Smoker    Packs/day: 0.50    Years: 40.00    Pack years: 20.00    Types: Cigarettes  . Smokeless tobacco: Never Used  . Tobacco comment: smoked 1.5ppd x 9, 1ppdx40, cut back to 1/2ppd past 5 years  Substance and Sexual Activity  . Alcohol use: No  . Drug use: No  . Sexual activity: Not Currently  Other Topics Concern  . Not on file  Social History Narrative  . Not on file   Social Determinants of Health   Financial Resource Strain: High Risk  . Difficulty of Paying Living Expenses: Hard  Food  Insecurity: Food Insecurity Present  . Worried About Charity fundraiser in the Last Year: Sometimes true  . Ran Out of Food in the Last Year: Sometimes true  Transportation Needs: No Transportation Needs  . Lack of Transportation (Medical): No  . Lack of Transportation (Non-Medical): No  Physical Activity: Insufficiently Active  . Days of Exercise per Week: 7 days  . Minutes of Exercise per Session:  10 min  Stress: No Stress Concern Present  . Feeling of Stress : Not at all  Social Connections:   . Frequency of Communication with Friends and Family:   . Frequency of Social Gatherings with Friends and Family:   . Attends Religious Services:   . Active Member of Clubs or Organizations:   . Attends Archivist Meetings:   Marland Kitchen Marital Status:   Intimate Partner Violence: Not At Risk  . Fear of Current or Ex-Partner: No  . Emotionally Abused: No  . Physically Abused: No  . Sexually Abused: No     Family History  Problem Relation Age of Onset  . Stroke Mother   . Prostate cancer Father   . Colon cancer Other   . Heart disease Other   . Heart attack Son   . Hypertension Sister   . Hypertension Son        X51  . Stroke Maternal Grandfather   . Stroke Paternal Grandmother      ROS:  Please see the history of present illness.   All other systems reviewed and negative.    Physical Exam:  Blood pressure 110/82, pulse 91, height 5\' 7"  (1.702 m), weight 230 lb (104.3 kg), SpO2 90 %. General: Well developed, well nourished female in no acute distress. Head: Normocephalic, atraumatic, sclera non-icteric, no xanthomas, nares are without discharge. EENT: normal  Lymph Nodes:  none Neck: Negative for carotid bruits. JVD not elevated. Back:without scoliosis kyphosis  Lungs: Clear bilaterally to auscultation without wheezes, rales, or rhonchi. Breathing is unlabored. Heart: RRR with S1 S2. No murmur . No rubs, or gallops appreciated. Abdomen: Soft, non-tender, non-distended with  normoactive bowel sounds. No hepatomegaly. No rebound/guarding. No obvious abdominal masses. Msk:  Strength and tone appear normal for age. Extremities: No clubbing or cyanosis. No edema.  Distal pedal pulses are 2+ and equal bilaterally. Skin: Warm and Dry Neuro: Alert and oriented X 3. CN III-XII intact Grossly normal sensory and motor function . Psych:  Responds to questions appropriately with a normal affect.      Labs: Cardiac Enzymes No results for input(s): CKTOTAL, CKMB, TROPONINI in the last 72 hours. CBC Lab Results  Component Value Date   WBC 9.0 06/30/2019   HGB 12.8 06/30/2019   HCT 38.9 06/30/2019   MCV 79 06/30/2019   PLT 243 06/30/2019   PROTIME: No results for input(s): LABPROT, INR in the last 72 hours. Chemistry No results for input(s): NA, K, CL, CO2, BUN, CREATININE, CALCIUM, PROT, BILITOT, ALKPHOS, ALT, AST, GLUCOSE in the last 168 hours.  Invalid input(s): LABALBU Lipids Lab Results  Component Value Date   CHOL 134 02/25/2019   HDL 37 (L) 02/25/2019   LDLCALC 72 02/25/2019   TRIG 142 02/25/2019   BNP Pro B Natriuretic peptide (BNP)  Date/Time Value Ref Range Status  06/28/2009 02:51 AM 118.0 (H) 0.0 - 100.0 pg/mL Final  06/26/2009 07:10 PM 244.0 (H) 0.0 - 100.0 pg/mL Final   Thyroid Function Tests: No results for input(s): TSH, T4TOTAL, T3FREE, THYROIDAB in the last 72 hours.  Invalid input(s): FREET3 Miscellaneous Lab Results  Component Value Date   DDIMER (H) 06/26/2009    1.78        AT THE INHOUSE ESTABLISHED CUTOFF VALUE OF 0.48 ug/mL FEU, THIS ASSAY HAS BEEN DOCUMENTED IN THE LITERATURE TO HAVE A SENSITIVITY AND NEGATIVE PREDICTIVE VALUE OF AT LEAST 98 TO 99%.  THE TEST RESULT SHOULD BE CORRELATED WITH AN ASSESSMENT OF THE CLINICAL  PROBABILITY OF DVT / VTE.    Radiology/Studies:  No results found.  EKG: 1/21 sinus @ 94 (on review I think this is sinus-- see 4/21 below) 16/12/45  LBBB  9/20 Sinus  LBBB/IVCD monophasic R  wave present in V6 not in lead I or aVL 11/19 sinus rhythm at 95 with an IVCD with an RS in lead I a Qr in aVL and a RsS in lead V6 4/21 I am not sure that this does not represent atrial tachycardia 2: 1 conduction at a rate of 97.  There are also frequent PVCs Assessment and Plan:   Syncope  Cardiomyopathy  IVCD  Hypertension  PVCs  Her syncope history does not sound arrhythmic.  Much more like orthostatic hypotension not withstanding the fact that it was not demonstrable today.  Have discussed the importance of being aware of the prodrome and the need to sit down until it abates.  It may be of most appropriate that her blood pressure be allowed to come up as much as her cardiomyopathy therapies will tolerate.  In this regard it might be worth reducing her enalapril dose.  Perhaps then she could tolerate a low-dose beta-blocker.    Her IVCD does not afford a great opportunity for resynchronization.  Her left bundle when present is borderline of length and mostly it is an IVCD.  Her ejection fraction has improved some but is borderline.   I think she is an atrial tachycardia today.  I am appreciating this after she has left the office.  We will arrange to have her come back into the office for carotid sinus massage to try to clarify the mechanism of her rhythm.  If she is in atrial tachycardia restoring sinus rhythm prior to reassessment of LV function would be appropriate.  Her LV function is borderline and it may well be that she has recovered to the point that her ejection fraction would exceed 35% making the issue of an ICD less clear.  Her PVCs off her electrocardiogram suggest a frequency of about 8%.  We await the event recorder that has been ordered.      Virl Axe

## 2019-09-07 NOTE — Telephone Encounter (Signed)
Enrolled patient for a 3 day Zio monitor to be mailed to patients home.  

## 2019-09-07 NOTE — Patient Instructions (Signed)
Medication Instructions:  Your physician recommends that you continue on your current medications as directed. Please refer to the Current Medication list given to you today.  *If you need a refill on your cardiac medications before your next appointment, please call your pharmacy*   Lab Work: None ordered.  If you have labs (blood work) drawn today and your tests are completely normal, you will receive your results only by: Marland Kitchen MyChart Message (if you have MyChart) OR . A paper copy in the mail If you have any lab test that is abnormal or we need to change your treatment, we will call you to review the results.   Testing/Procedures: [facility]n [facility]- Long Term Monitor Instructions   Your physician has requested you wear your ZIO patch monitor___3____days.   This is a single patch monitor.  [employer] supplies one patch monitor per enrollment.  Additional stickers are not available.   Please do not apply patch if you will be having a Nuclear Stress Test, Echocardiogram, Cardiac CT, MRI, or Chest Xray during the time frame you would be wearing the monitor. The patch cannot be worn during these tests.  You cannot remove and re-apply the ZIO XT patch monitor.   Your ZIO patch monitor will be sent USPS Priority mail from [facility] directly to your home address. The monitor may also be mailed to a PO BOX if home delivery is not available.   It may take 3-5 days to receive your monitor after you have been enrolled.   Once you have received you monitor, please review enclosed instructions.  Your monitor has already been registered assigning a specific monitor serial # to you.   Applying the monitor   Shave hair from upper left chest.   Hold abrader disc by orange tab.  Rub abrader in 40 strokes over left upper chest as indicated in your monitor instructions.   Clean area with 4 enclosed alcohol pads .  Use all pads to assure are is cleaned thoroughly.  Let dry.   Apply patch as indicated in  monitor instructions.  Patch will be place under collarbone on left side of chest with arrow pointing upward.   Rub patch adhesive wings for 2 minutes.Remove white label marked "1".  Remove white label marked "2".  Rub patch adhesive wings for 2 additional minutes.   While looking in a mirror, press and release button in center of patch.  A small green light will flash 3-4 times .  This will be your only indicator the monitor has been turned on.     Do not shower for the first 24 hours.  You may shower after the first 24 hours.   Press button if you feel a symptom. You will hear a small click.  Record Date, Time and Symptom in the Patient Log Book.   When you are ready to remove patch, follow instructions on last 2 pages of Patient Log Book.  Stick patch monitor onto last page of Patient Log Book.   Place Patient Log Book in [place] box.  Use locking tab on box and tape box closed securely.  The [employer] and [employer] has [employer] on it.  Please place in mailbox as soon as possible.  Your physician should have your test results approximately 7 days after the monitor has been mailed back to [facility].   Call [place] at [phone] if you have questions regarding your ZIO XT patch monitor.  Call them immediately if you see  an orange light blinking on your monitor.   If your monitor falls off in less than 4 days contact our Monitor department at [phone].  If your monitor becomes loose or falls off after 4 days call [employer] at [phone] for suggestions on securing your monitor.     Follow-Up: At [facility], you and your health needs are our priority.  As part of our continuing mission to provide you with exceptional heart care, we have created designated Provider Care Teams.  These Care Teams include your primary Cardiologist (physician) and Advanced Practice Providers (APPs -  Physician Assistants and Nurse Practitioners) who all work together to provide  you with the care you need, when you need it.  We recommend signing up for the patient portal called "MyChart".  Sign up information is provided on this After Visit Summary.  MyChart is used to connect with patients for Virtual Visits (Telemedicine).  Patients are able to view lab/test results, encounter notes, upcoming appointments, etc.  Non-urgent messages can be sent to your provider as well.   To learn more about what you can do with MyChart, go to [email or web address].    Your next appointment:  Dependent on testing results   The format for your next appointment:   In Person  Provider:   You may see Dr [name] or one of the following Advanced Practice Providers on your designated Care Team:    Chanetta Marshall, NP  Tommye Standard, PA-C  Legrand Como "[place]" [place], [place]

## 2019-09-13 ENCOUNTER — Other Ambulatory Visit (INDEPENDENT_AMBULATORY_CARE_PROVIDER_SITE_OTHER): Payer: Medicare Other

## 2019-09-13 DIAGNOSIS — I480 Paroxysmal atrial fibrillation: Secondary | ICD-10-CM | POA: Diagnosis not present

## 2019-09-13 DIAGNOSIS — R55 Syncope and collapse: Secondary | ICD-10-CM

## 2019-09-13 DIAGNOSIS — I428 Other cardiomyopathies: Secondary | ICD-10-CM

## 2019-09-22 ENCOUNTER — Other Ambulatory Visit: Payer: Self-pay | Admitting: Cardiovascular Disease

## 2019-09-23 NOTE — Telephone Encounter (Signed)
Rx(s) sent to pharmacy electronically.  

## 2019-09-28 ENCOUNTER — Telehealth: Payer: Self-pay | Admitting: Cardiovascular Disease

## 2019-09-28 DIAGNOSIS — I480 Paroxysmal atrial fibrillation: Secondary | ICD-10-CM | POA: Diagnosis not present

## 2019-09-28 DIAGNOSIS — R55 Syncope and collapse: Secondary | ICD-10-CM | POA: Diagnosis not present

## 2019-09-28 NOTE — Telephone Encounter (Signed)
[name] with [employer] is calling to report a critical Zio patch notification.

## 2019-09-28 NOTE — Telephone Encounter (Signed)
Spoke with Claiborne Billings from [facility]) Report is posted Rapid AFib for >60 seconds - HR 184bpm  Continuous A-Flutter for entirety of monitor - 2 days, 8 hours  Routed to S. [name] MD who ordered monitor and Markus Daft to obtain report from [facility] portal

## 2019-09-29 ENCOUNTER — Other Ambulatory Visit: Payer: Self-pay | Admitting: Cardiovascular Disease

## 2019-09-30 ENCOUNTER — Encounter: Payer: Self-pay | Admitting: Cardiovascular Disease

## 2019-09-30 ENCOUNTER — Telehealth (INDEPENDENT_AMBULATORY_CARE_PROVIDER_SITE_OTHER): Payer: Medicare Other | Admitting: Cardiovascular Disease

## 2019-09-30 VITALS — BP 109/64 | HR 88 | Temp 97.4°F | Ht 67.0 in | Wt 227.0 lb

## 2019-09-30 DIAGNOSIS — I471 Supraventricular tachycardia: Secondary | ICD-10-CM

## 2019-09-30 DIAGNOSIS — R55 Syncope and collapse: Secondary | ICD-10-CM

## 2019-09-30 DIAGNOSIS — I447 Left bundle-branch block, unspecified: Secondary | ICD-10-CM | POA: Diagnosis not present

## 2019-09-30 DIAGNOSIS — I428 Other cardiomyopathies: Secondary | ICD-10-CM | POA: Diagnosis not present

## 2019-09-30 DIAGNOSIS — Z01812 Encounter for preprocedural laboratory examination: Secondary | ICD-10-CM

## 2019-09-30 DIAGNOSIS — I10 Essential (primary) hypertension: Secondary | ICD-10-CM | POA: Diagnosis not present

## 2019-09-30 DIAGNOSIS — I4719 Other supraventricular tachycardia: Secondary | ICD-10-CM

## 2019-09-30 HISTORY — DX: Other supraventricular tachycardia: I47.19

## 2019-09-30 HISTORY — DX: Supraventricular tachycardia: I47.1

## 2019-09-30 NOTE — Patient Instructions (Signed)
Medication Instructions:  Your physician recommends that you continue on your current medications as directed. Please refer to the Current Medication list given to you today.  Lab Work: CBC/BMET 10/12/2019 PRIOR TO COVID SCREENING   COVID SCREENING ON 10/12/2019 AT 2:40 PM [facility]   If you have labs (blood work) drawn today and your tests are completely normal, you will receive your results only by: Marland Kitchen MyChart Message (if you have MyChart) OR . A paper copy in the mail If you have any lab test that is abnormal or we need to change your treatment, we will call you to review the results.  Testing/Procedures: Your physician has recommended that you have a Cardioversion (DCCV). Electrical Cardioversion uses a jolt of electricity to your heart either through paddles or wired patches attached to your chest. This is a controlled, usually prescheduled, procedure. Defibrillation is done under light anesthesia in the hospital, and you usually go home the day of the procedure. This is done to get your heart back into a normal rhythm. You are not awake for the procedure. Please see the instruction sheet given to you today.  Follow-Up: At [facility], you and your health needs are our priority.  As part of our continuing mission to provide you with exceptional heart care, we have created designated Provider Care Teams.  These Care Teams include your primary Cardiologist (physician) and Advanced Practice Providers (APPs -  Physician Assistants and Nurse Practitioners) who all work together to provide you with the care you need, when you need it.  We recommend signing up for the patient portal called "MyChart".  Sign up information is provided on this After Visit Summary.  MyChart is used to connect with patients for Virtual Visits (Telemedicine).  Patients are able to view lab/test results, encounter notes, upcoming appointments, etc.  Non-urgent messages can be sent to your provider as well.    To learn more about what you can do with MyChart, go to [email or web address].    Your next appointment:   DR [name] AFTER YOUR CARDIOVERSION  THE OFFICE WILL CALL YOU TO SCHEDULE   DR [place] 3 MONTHS 12/30/2019 at 9:40 am   Other Instructions  You are scheduled for a Cardioversion on 10/15/2019 with Dr. [name].  Please arrive at the [facility] (Main Entrance [facility]t [facility] [place], [place] [address] at 8:00 AM. (1 hour prior to procedure unless lab work is needed; if lab work is needed arrive 1.5 hours ahead)  DIET: Nothing to eat or drink after midnight except a sip of water with medications (see medication instructions below)  Medication Instructions: Hold FUROSEMIDE MORNING   Continue your anticoagulant: ELIQUIS  You will need to continue your anticoagulant after your procedure until you are told by your  Provider that it is safe to stop  Labs: CBC/BMET   Come to: 10/12/2019 [place] must have a responsible person to drive you home and stay in the waiting area during your procedure. Failure to do so could result in cancellation.  Bring your insurance cards.  *Special Note: Every effort is made to have your procedure done on time. Occasionally there are emergencies that occur at the hospital that may cause delays. Please be patient if a delay does occur.   Electrical Cardioversion Electrical cardioversion is the delivery of a jolt of electricity to restore a normal rhythm to the heart. A rhythm that is too fast or is not regular keeps the  heart from pumping well. In this procedure, sticky patches or metal paddles are placed on the chest to deliver electricity to the heart from a device. This procedure may be done in an emergency if:  There is low or no blood pressure as a result of the heart rhythm.  Normal rhythm must be restored as fast as possible to protect the brain and heart from further damage.  It may save a life. This  may also be a scheduled procedure for irregular or fast heart rhythms that are not immediately life-threatening. Tell a health care provider about:  Any allergies you have.  All medicines you are taking, including vitamins, herbs, eye drops, creams, and over-the-counter medicines.  Any problems you or family members have had with anesthetic medicines.  Any blood disorders you have.  Any surgeries you have had.  Any medical conditions you have.  Whether you are pregnant or may be pregnant. What are the risks? Generally, this is a safe procedure. However, problems may occur, including:  Allergic reactions to medicines.  A blood clot that breaks free and travels to other parts of your body.  The possible return of an abnormal heart rhythm within hours or days after the procedure.  Your heart stopping (cardiac arrest). This is rare. What happens before the procedure? Medicines  Your health care provider may have you start taking: ? Blood-thinning medicines (anticoagulants) so your blood does not clot as easily. ? Medicines to help stabilize your heart rate and rhythm.  Ask your health care provider about: ? Changing or stopping your regular medicines. This is especially important if you are taking diabetes medicines or blood thinners. ? Taking medicines such as aspirin and ibuprofen. These medicines can thin your blood. Do not take these medicines unless your health care provider tells you to take them. ? Taking over-the-counter medicines, vitamins, herbs, and supplements. General instructions  Follow instructions from your health care provider about eating or drinking restrictions.  Plan to have someone take you home from the hospital or clinic.  If you will be going home right after the procedure, plan to have someone with you for 24 hours.  Ask your health care provider what steps will be taken to help prevent infection. These may include washing your skin with a  germ-killing soap. What happens during the procedure?   An IV will be inserted into one of your veins.  Sticky patches (electrodes) or metal paddles may be placed on your chest.  You will be given a medicine to help you relax (sedative).  An electrical shock will be delivered. The procedure may vary among health care providers and hospitals. What can I expect after the procedure?  Your blood pressure, heart rate, breathing rate, and blood oxygen level will be monitored until you leave the hospital or clinic.  Your heart rhythm will be watched to make sure it does not change.  You may have some redness on the skin where the shocks were given. Follow these instructions at home:  Do not drive for 24 hours if you were given a sedative during your procedure.  Take over-the-counter and prescription medicines only as told by your health care provider.  Ask your health care provider how to check your pulse. Check it often.  Rest for 48 hours after the procedure or as told by your health care provider.  Avoid or limit your caffeine use as told by your health care provider.  Keep all follow-up visits as told by your health  care provider. This is important. Contact a health care provider if:  You feel like your heart is beating too quickly or your pulse is not regular.  You have a serious muscle cramp that does not go away. Get help right away if:  You have discomfort in your chest.  You are dizzy or you feel faint.  You have trouble breathing or you are short of breath.  Your speech is slurred.  You have trouble moving an arm or leg on one side of your body.  Your fingers or toes turn cold or blue. Summary  Electrical cardioversion is the delivery of a jolt of electricity to restore a normal rhythm to the heart.  This procedure may be done right away in an emergency or may be a scheduled procedure if the condition is not an emergency.  Generally, this is a safe  procedure.  After the procedure, check your pulse often as told by your health care provider. This information is not intended to replace advice given to you by your health care provider. Make sure you discuss any questions you have with your health care provider. Document Revised: 12/21/2018 Document Reviewed: 12/21/2018 Elsevier Patient Education  [place].

## 2019-09-30 NOTE — H&P (View-Only) (Signed)
Virtual Visit via Video Note   This visit type was conducted due to national recommendations for restrictions regarding the COVID-19 Pandemic (e.g. social distancing) in an effort to limit this patient's exposure and mitigate transmission in our community.  Due to her co-morbid illnesses, this patient is at least at moderate risk for complications without adequate follow up.  This format is felt to be most appropriate for this patient at this time.  All issues noted in this document were discussed and addressed.  A limited physical exam was performed with this format.  Please refer to the patient's chart for her consent to telehealth for [facility].   Evaluation Performed:  Follow-up visit  Date:  09/30/2019   ID:  [name], DOB [birth year], MRN [id number]  Patient Location: Home Provider Location: Office  PCP:  [name], MD  Cardiologist:  [name], MD  Electrophysiologist:  None   Chief Complaint:  Heart failure  History of Present Illness:    [name] is a 74 y.o. female with chronic systolic and diastolic heart failure (LVEF 25 to 30%, nonischemic), PAF, diabetes, hyperlipidemia, hypertension, and tobacco abuse here for follow up.  She was previously a patient of Dr. Ron Parker and then Dr. Irish Lack.  She was first diagnosed with heart failure 06/2009.  At that time her LVEF was 25%.  This was associated with atrial fibrillation.  She reports having an ablation with Dr. [name] in 1999, though those records are not available at this time.  She has undergone cardioversions for atrial fibrillation with Dr. Ron Parker.  LVEF has ranged from 25 to 35% but has not improved despite beta-blockers and ACE inhibitor.  She had an echo 11/2011 that revealed LVEF 50 to 55%.  However subsequent studies have ranged from 20-35%.  LVEF on Lexiscan Myoview 09/2014 was 41%.  She developed a left bundle branch block pattern in 2014.  Her cardiomyopathy is nonischemic.  She last saw Dr.  Irish Lack 04/2018 and carvedilol was increased due to concern for atrial tachycardia.  She felt fatigued and shaky at the higher dose.  Her ACE inhibitor was reduced 2/2 CKD III.  Prior to that she called the office 12/2017 with volume overload.  She previously discussed ICD with Dr. Irish Lack but was not interested.  We continued this discussion and she continues to want to think about it.  She was considering CRT-D after coronavirus.  Carvedilol was switched to metoprolol due to hypotension and tachycardia.  Her heart rate remained elevated but her energy levels improved.  Digoxin was started due to poor heart rate control and heart failure.  At her last appointment Ms. Persing had a recent episode of syncope.  She was referred for an ambulatory monitor which revealed atrial fibrillation with PVCs.  She saw Dr. [name] on 4/6 who thought she was actually in atrial tachycardia.  He recommended waiting on ICD to see if her LVEF  Improved and felt she was unlikely to benefit from CRT.  She has been feeling generally well.  She denies lightheadedness or dizziness except for one day last week.  She notes that she is retaining fluid and it is making her short of breath.  Her weight went up by 5lb overnight so she plans to take extra lasix.  The patient does not have symptoms concerning for COVID-19 infection (fever, chills, cough, or new shortness of breath).    Past Medical History:  Diagnosis Date  . Arthritis    "left knee" (03/12/2013)  .  Atrial fibrillation ([place])    Cardioverted to NSR 08/22/2009  . Atrial tachycardia ([place]) 09/30/2019  . Cardiomyopathy   . CHF (congestive heart failure) ([place])   . CKD (chronic kidney disease)   . Depression   . Dizziness   . DM2 (diabetes mellitus, type 2) ([place])    fasting 80-100 (03/12/2013)  . Drug therapy 1/11   coumadin  . Ejection fraction < 50%    20-25%, echo.06/2009, /  EF 30-35%, diffuse hypokinesis, echo, Oct 16, 2010  . GERD (gastroesophageal reflux  disease)   . Gout    "left knee" (03/12/2013)  . History of bronchitis   . History of emphysema   . HLD (hyperlipidemia)   . Hyperkalemia   . Hypertension   . LFT elevation    06/2009  . Mitral regurgitation    mild - echo - 1/11 /  Mild, echo, May15, 2012  . Overweight(278.02)   . Pneumonia    "@ least twice" (03/12/2013)  . Thyroid dysfunction    thyromegally,diffuse,nodule LLL,06/2009  . Tobacco abuse   . Tricuspid regurgitation    moderate - echo - 1/11   Past Surgical History:  Procedure Laterality Date  . BREAST BIOPSY Right 1980's  . BREAST LUMPECTOMY Right 1980's  . CATARACT EXTRACTION W/ INTRAOCULAR LENS  IMPLANT, BILATERAL Bilateral 2010-2012  . CHOLECYSTECTOMY  ~ 1978  . JOINT REPLACEMENT    . TONSILLECTOMY  1960's  . TOTAL KNEE ARTHROPLASTY Left 03/12/2013  . TOTAL KNEE ARTHROPLASTY Left 03/12/2013   Procedure: TOTAL KNEE ARTHROPLASTY;  Surgeon: Newt Minion, MD;  Location: [facility];  Service: Orthopedics;  Laterality: Left;  Left total knee arthroplasty  . TUBAL LIGATION    . VAGINAL HYSTERECTOMY  1987     Current Meds  Medication Sig  . acetaminophen (TYLENOL) 500 MG tablet Take 1,000 mg by mouth 3 (three) times daily as needed for pain.  . BD PEN NEEDLE NANO U/F 32G X 4 MM MISC USE AS DIRECTED WITH INSULIN PENS  . Cholecalciferol (VITAMIN D) 2000 UNITS CAPS Take 2,000 Units by mouth daily.  . colchicine 0.6 MG tablet Take 0.6 mg by mouth 2 (two) times daily as needed (GOUT).   Marland Kitchen digoxin (LANOXIN) 0.125 MG tablet Take 1 tablet (0.125 mg total) by mouth daily.  Marland Kitchen ELIQUIS 5 MG TABS tablet TAKE 1 TABLET BY MOUTH TWICE A DAY  . enalapril (VASOTEC) 5 MG tablet TAKE 1 TABLET BY MOUTH EVERYDAY AT BEDTIME  . fluocinonide cream (LIDEX) 0.05 % Apply topically as needed (as directed for dry skin).   . fluticasone (FLONASE) 50 MCG/ACT nasal spray Place 1 spray into both nostrils daily.  . furosemide (LASIX) 40 MG tablet TAKE 1 TABLET BY MOUITH DAILY AS NEEDED FOR EDEMA  FOR FLUID OVERLOAD  . KLOR-CON M20 20 MEQ tablet TAKE 1 TABLET (20 MEQ TOTAL) BY MOUTH DAILY. WHEN TAKING FUROSEMIDE (LASIX).  Marland Kitchen liraglutide (VICTOZA) 18 MG/3ML SOPN Inject 0.2 mLs (1.2 mg total) into the skin daily. (Patient taking differently: Inject 0.6 mg into the skin daily. )  . metoprolol succinate (TOPROL-XL) 50 MG 24 hr tablet TAKE 1 TABLET (50 MG TOTAL) BY MOUTH DAILY. TAKE WITH OR IMMEDIATELY FOLLOWING A MEAL.  . Multiple Vitamin (MULITIVITAMIN WITH MINERALS) TABS Take 1 tablet by mouth daily. Centrum Silver  . mupirocin ointment (BACTROBAN) 2 % Apply 1 application topically daily.   [name] VERIO test strip USE AS DIRECTED TO CHECK BLOOD SUGARS 1 TIME PER DAY DX: E11.22  .  pantoprazole (PROTONIX) 20 MG tablet Take 20 mg by mouth 2 (two) times daily.  . Pitavastatin Calcium (LIVALO) 2 MG TABS Take by mouth. Pt take 1 tablet M-W-F  . pravastatin (PRAVACHOL) 40 MG tablet Take 1 tablet by mouth once per week  . vitamin C (ASCORBIC ACID) 500 MG tablet Take 500 mg by mouth daily.     Allergies:   Codeine, Augmentin [amoxicillin-pot clavulanate], Coreg [carvedilol], and Warfarin and related   Social History   Tobacco Use  . Smoking status: Current Every Day Smoker    Packs/day: 0.50    Years: 40.00    Pack years: 20.00    Types: Cigarettes  . Smokeless tobacco: Never Used  . Tobacco comment: smoked 1.5ppd x 9, 1ppdx40, cut back to 1/2ppd past 5 years  Substance Use Topics  . Alcohol use: No  . Drug use: No     Family Hx: The patient's family history includes Colon cancer in an other family member; Heart attack in her son; Heart disease in an other family member; Hypertension in her sister and son; Prostate cancer in her father; Stroke in her maternal grandfather, mother, and paternal grandmother.  ROS:   Please see the history of present illness.     All other systems reviewed and are negative.   Prior CV studies:   The following studies were reviewed today:  Echo  07/28/15: Study Conclusions  - Left ventricle: The cavity size was normal. There was moderate   concentric hypertrophy. Systolic function was severely reduced.   The estimated ejection fraction was in the range of 25% to 30%.   Diffuse hypokinesis. Features are consistent with a pseudonormal   left ventricular filling pattern, with concomitant abnormal   relaxation and increased filling pressure (grade 2 diastolic   dysfunction). Doppler parameters are consistent with high   ventricular filling pressure. - Aortic valve: Transvalvular velocity was within the normal range.   There was no stenosis. There was no regurgitation. - Mitral valve: Calcified annulus. Mildly thickened leaflets .   There was trivial regurgitation. - Left atrium: The atrium was mildly dilated. - Right ventricle: The cavity size was normal. Wall thickness was   normal. Systolic function was normal. - Atrial septum: A patent foramen ovale cannot be excluded. - Tricuspid valve: There was mild regurgitation. Peak RV-RA   gradient (S): 30 mm Hg. - Inferior vena cava: The vessel was normal in size. The   respirophasic diameter changes were in the normal range (= 50%),   consistent with normal central venous pressure.  Lexiscan Myoview 09/08/14: LVEF 41%.  No ischemia.   Labs/Other Tests and Data Reviewed:    EKG:  An ECG dated 04/22/19 was personally reviewed today and demonstrated:  Ectopic atrial rhythm.  Rate 95 bpm.  PVCs.  IVCD.  QRS 130 ms.   Recent Labs: 12/01/2018: BNP 75.1; TSH 1.050 06/30/2019: ALT 21; BUN 22; Creatinine, Ser 1.38; Hemoglobin 12.8; Platelets 243; Potassium 4.3; Sodium 138   Recent Lipid Panel Lab Results  Component Value Date/Time   CHOL 134 02/25/2019 04:47 PM   TRIG 142 02/25/2019 04:47 PM   HDL 37 (L) 02/25/2019 04:47 PM   CHOLHDL 3.6 02/25/2019 04:47 PM   CHOLHDL 4.8 06/27/2009 03:28 AM   LDLCALC 72 02/25/2019 04:47 PM    Wt Readings from Last 3 Encounters:  09/30/19 227 lb  (103 kg)  09/07/19 230 lb (104.3 kg)  08/03/19 220 lb (99.8 kg)     Objective:    BP  109/64   Pulse 88   Temp (!) 97.4 F (36.3 C)   Ht 5\' 7"  (1.702 m)   Wt 227 lb (103 kg)   BMI 35.55 kg/m  GENERAL: Well-appearing.  No acute distress. HEENT: Pupils equal round.  Oral mucosa unremarkable NECK:  No jugular venous distention, no visible thyromegaly EXT:  +LE edema, no cyanosis no clubbing SKIN:  No rashes no nodules NEURO:  Speech fluent.  Cranial nerves grossly intact.  Moves all 4 extremities freely PSYCH:  Cognitively intact, oriented to person place and time    ASSESSMENT & PLAN:    # Syncope:  Unclear what caused the episode.  Atrial tachycardia seen on event monitors.  Planning for DCCV 5/14.  # Atrial tachycardia;  Planning for DCCV as above.  Continue metoprolol.  # Acute on chronic systolic and diastolic heart failure: NICM.  She is euvolemic.  Echo 02/2019 revealed LVEF 30-35%.  She is agreeable to getting CRT-D after her second COVID vaccine.  Will plan for early April.  Continue metoprolol succinate, digoxin, and enalapril.  BP is low so we will not add Entresto or spironolactone.  # Asymptomatic CAD: Noted on chest CT.  Continue pitavastatin. LDL goal <70  # Hyperlipidemia: Ms. Lawther is on pitavastatin 2mg  daily.  LDL was 72 pm 02/2019  # Persistent atrial fibrillation: H/o atrial fibrillation s/p remote ablation.  Continue metoprolol and digoxin.  She is hesitant to stop Eliquis.    # Tobacco abuse: Wants to cut back again.   COVID-19 Education: The signs and symptoms of COVID-19 were discussed with the patient and how to seek care for testing (follow up with PCP or arrange E-visit).  The importance of social distancing was discussed today.  Time:   Today, I have spent 30 minutes with the patient with telehealth technology discussing the above problems.     Medication Adjustments/Labs and Tests Ordered: Current medicines are reviewed at length  with the patient today.  Concerns regarding medicines are outlined above.   Tests Ordered: No orders of the defined types were placed in this encounter.   Medication Changes: No orders of the defined types were placed in this encounter.   Disposition:  Follow up in 4 month(s)  Signed, [name], MD  09/30/2019 10:31 AM    [place]

## 2019-09-30 NOTE — Progress Notes (Signed)
Virtual Visit via Video Note   This visit type was conducted due to national recommendations for restrictions regarding the COVID-19 Pandemic (e.g. social distancing) in an effort to limit this patient's exposure and mitigate transmission in our community.  Due to her co-morbid illnesses, this patient is at least at moderate risk for complications without adequate follow up.  This format is felt to be most appropriate for this patient at this time.  All issues noted in this document were discussed and addressed.  A limited physical exam was performed with this format.  Please refer to the patient's chart for her consent to telehealth for [facility]   Evaluation Performed:  Follow-up visit  Date:  09/30/2019   ID:  [name], DOB [birth year], MRN [id number]  Patient Location: Home Provider Location: Office  PCP:  [name], MD  Cardiologist:  [name], MD  Electrophysiologist:  None   Chief Complaint:  Heart failure  History of Present Illness:    [name] is a 74 y.o. female with chronic systolic and diastolic heart failure (LVEF 25 to 30%, nonischemic), PAF, diabetes, hyperlipidemia, hypertension, and tobacco abuse here for follow up.  She was previously a patient of Dr. Ron Parker and then Dr. Irish Lack.  She was first diagnosed with heart failure 06/2009.  At that time her LVEF was 25%.  This was associated with atrial fibrillation.  She reports having an ablation with Dr. [name] in 1999, though those records are not available at this time.  She has undergone cardioversions for atrial fibrillation with Dr. Ron Parker.  LVEF has ranged from 25 to 35% but has not improved despite beta-blockers and ACE inhibitor.  She had an echo 11/2011 that revealed LVEF 50 to 55%.  However subsequent studies have ranged from 20-35%.  LVEF on Lexiscan Myoview 09/2014 was 41%.  She developed a left bundle branch block pattern in 2014.  Her cardiomyopathy is nonischemic.  She last saw Dr.  Irish Lack 04/2018 and carvedilol was increased due to concern for atrial tachycardia.  She felt fatigued and shaky at the higher dose.  Her ACE inhibitor was reduced 2/2 CKD III.  Prior to that she called the office 12/2017 with volume overload.  She previously discussed ICD with Dr. Irish Lack but was not interested.  We continued this discussion and she continues to want to think about it.  She was considering CRT-D after coronavirus.  Carvedilol was switched to metoprolol due to hypotension and tachycardia.  Her heart rate remained elevated but her energy levels improved.  Digoxin was started due to poor heart rate control and heart failure.  At her last appointment [name] had a recent episode of syncope.  She was referred for an ambulatory monitor which revealed atrial fibrillation with PVCs.  She saw Dr. [name] on 4/6 who thought she was actually in atrial tachycardia.  He recommended waiting on ICD to see if her LVEF  Improved and felt she was unlikely to benefit from CRT.  She has been feeling generally well.  She denies lightheadedness or dizziness except for one day last week.  She notes that she is retaining fluid and it is making her short of breath.  Her weight went up by 5lb overnight so she plans to take extra lasix.  The patient does not have symptoms concerning for COVID-19 infection (fever, chills, cough, or new shortness of breath).    Past Medical History:  Diagnosis Date  . Arthritis    "left knee" (03/12/2013)  .  Atrial fibrillation ([place])    Cardioverted to NSR 08/22/2009  . Atrial tachycardia ([place]) 09/30/2019  . Cardiomyopathy   . CHF (congestive heart failure) ([place])   . CKD (chronic kidney disease)   . Depression   . Dizziness   . DM2 (diabetes mellitus, type 2) ([place])    fasting 80-100 (03/12/2013)  . Drug therapy 1/11   coumadin  . Ejection fraction < 50%    20-25%, echo.06/2009, /  EF 30-35%, diffuse hypokinesis, echo, Oct 16, 2010  . GERD (gastroesophageal reflux  disease)   . Gout    "left knee" (03/12/2013)  . History of bronchitis   . History of emphysema   . HLD (hyperlipidemia)   . Hyperkalemia   . Hypertension   . LFT elevation    06/2009  . Mitral regurgitation    mild - echo - 1/11 /  Mild, echo, May15, 2012  . Overweight(278.02)   . Pneumonia    "@ least twice" (03/12/2013)  . Thyroid dysfunction    thyromegally,diffuse,nodule LLL,06/2009  . Tobacco abuse   . Tricuspid regurgitation    moderate - echo - 1/11   Past Surgical History:  Procedure Laterality Date  . BREAST BIOPSY Right 1980's  . BREAST LUMPECTOMY Right 1980's  . CATARACT EXTRACTION W/ INTRAOCULAR LENS  IMPLANT, BILATERAL Bilateral 2010-2012  . CHOLECYSTECTOMY  ~ 1978  . JOINT REPLACEMENT    . TONSILLECTOMY  1960's  . TOTAL KNEE ARTHROPLASTY Left 03/12/2013  . TOTAL KNEE ARTHROPLASTY Left 03/12/2013   Procedure: TOTAL KNEE ARTHROPLASTY;  Surgeon: Newt Minion, MD;  Location: [facility];  Service: Orthopedics;  Laterality: Left;  Left total knee arthroplasty  . TUBAL LIGATION    . VAGINAL HYSTERECTOMY  1987     Current Meds  Medication Sig  . acetaminophen (TYLENOL) 500 MG tablet Take 1,000 mg by mouth 3 (three) times daily as needed for pain.  . BD PEN NEEDLE NANO U/F 32G X 4 MM MISC USE AS DIRECTED WITH INSULIN PENS  . Cholecalciferol (VITAMIN D) 2000 UNITS CAPS Take 2,000 Units by mouth daily.  . colchicine 0.6 MG tablet Take 0.6 mg by mouth 2 (two) times daily as needed (GOUT).   Marland Kitchen digoxin (LANOXIN) 0.125 MG tablet Take 1 tablet (0.125 mg total) by mouth daily.  Marland Kitchen ELIQUIS 5 MG TABS tablet TAKE 1 TABLET BY MOUTH TWICE A DAY  . enalapril (VASOTEC) 5 MG tablet TAKE 1 TABLET BY MOUTH EVERYDAY AT BEDTIME  . fluocinonide cream (LIDEX) 0.05 % Apply topically as needed (as directed for dry skin).   . fluticasone (FLONASE) 50 MCG/ACT nasal spray Place 1 spray into both nostrils daily.  . furosemide (LASIX) 40 MG tablet TAKE 1 TABLET BY MOUITH DAILY AS NEEDED FOR EDEMA  FOR FLUID OVERLOAD  . KLOR-CON M20 20 MEQ tablet TAKE 1 TABLET (20 MEQ TOTAL) BY MOUTH DAILY. WHEN TAKING FUROSEMIDE (LASIX).  Marland Kitchen liraglutide (VICTOZA) 18 MG/3ML SOPN Inject 0.2 mLs (1.2 mg total) into the skin daily. (Patient taking differently: Inject 0.6 mg into the skin daily. )  . metoprolol succinate (TOPROL-XL) 50 MG 24 hr tablet TAKE 1 TABLET (50 MG TOTAL) BY MOUTH DAILY. TAKE WITH OR IMMEDIATELY FOLLOWING A MEAL.  . Multiple Vitamin (MULITIVITAMIN WITH MINERALS) TABS Take 1 tablet by mouth daily. Centrum Silver  . mupirocin ointment (BACTROBAN) 2 % Apply 1 application topically daily.   [name] VERIO test strip USE AS DIRECTED TO CHECK BLOOD SUGARS 1 TIME PER DAY DX: E11.22  .  pantoprazole (PROTONIX) 20 MG tablet Take 20 mg by mouth 2 (two) times daily.  . Pitavastatin Calcium (LIVALO) 2 MG TABS Take by mouth. Pt take 1 tablet M-W-F  . pravastatin (PRAVACHOL) 40 MG tablet Take 1 tablet by mouth once per week  . vitamin C (ASCORBIC ACID) 500 MG tablet Take 500 mg by mouth daily.     Allergies:   Codeine, Augmentin [amoxicillin-pot clavulanate], Coreg [carvedilol], and Warfarin and related   Social History   Tobacco Use  . Smoking status: Current Every Day Smoker    Packs/day: 0.50    Years: 40.00    Pack years: 20.00    Types: Cigarettes  . Smokeless tobacco: Never Used  . Tobacco comment: smoked 1.5ppd x 9, 1ppdx40, cut back to 1/2ppd past 5 years  Substance Use Topics  . Alcohol use: No  . Drug use: No     Family Hx: The patient's family history includes Colon cancer in an other family member; Heart attack in her son; Heart disease in an other family member; Hypertension in her sister and son; Prostate cancer in her father; Stroke in her maternal grandfather, mother, and paternal grandmother.  ROS:   Please see the history of present illness.     All other systems reviewed and are negative.   Prior CV studies:   The following studies were reviewed today:  Echo  07/28/15: Study Conclusions  - Left ventricle: The cavity size was normal. There was moderate   concentric hypertrophy. Systolic function was severely reduced.   The estimated ejection fraction was in the range of 25% to 30%.   Diffuse hypokinesis. Features are consistent with a pseudonormal   left ventricular filling pattern, with concomitant abnormal   relaxation and increased filling pressure (grade 2 diastolic   dysfunction). Doppler parameters are consistent with high   ventricular filling pressure. - Aortic valve: Transvalvular velocity was within the normal range.   There was no stenosis. There was no regurgitation. - Mitral valve: Calcified annulus. Mildly thickened leaflets .   There was trivial regurgitation. - Left atrium: The atrium was mildly dilated. - Right ventricle: The cavity size was normal. Wall thickness was   normal. Systolic function was normal. - Atrial septum: A patent foramen ovale cannot be excluded. - Tricuspid valve: There was mild regurgitation. Peak RV-RA   gradient (S): 30 mm Hg. - Inferior vena cava: The vessel was normal in size. The   respirophasic diameter changes were in the normal range (= 50%),   consistent with normal central venous pressure.  Lexiscan Myoview 09/08/14: LVEF 41%.  No ischemia.   Labs/Other Tests and Data Reviewed:    EKG:  An ECG dated 04/22/19 was personally reviewed today and demonstrated:  Ectopic atrial rhythm.  Rate 95 bpm.  PVCs.  IVCD.  QRS 130 ms.   Recent Labs: 12/01/2018: BNP 75.1; TSH 1.050 06/30/2019: ALT 21; BUN 22; Creatinine, Ser 1.38; Hemoglobin 12.8; Platelets 243; Potassium 4.3; Sodium 138   Recent Lipid Panel Lab Results  Component Value Date/Time   CHOL 134 02/25/2019 04:47 PM   TRIG 142 02/25/2019 04:47 PM   HDL 37 (L) 02/25/2019 04:47 PM   CHOLHDL 3.6 02/25/2019 04:47 PM   CHOLHDL 4.8 06/27/2009 03:28 AM   LDLCALC 72 02/25/2019 04:47 PM    Wt Readings from Last 3 Encounters:  09/30/19 227 lb  (103 kg)  09/07/19 230 lb (104.3 kg)  08/03/19 220 lb (99.8 kg)     Objective:    BP  109/64   Pulse 88   Temp (!) 97.4 F (36.3 C)   Ht 5\' 7"  (1.702 m)   Wt 227 lb (103 kg)   BMI 35.55 kg/m  GENERAL: Well-appearing.  No acute distress. HEENT: Pupils equal round.  Oral mucosa unremarkable NECK:  No jugular venous distention, no visible thyromegaly EXT:  +LE edema, no cyanosis no clubbing SKIN:  No rashes no nodules NEURO:  Speech fluent.  Cranial nerves grossly intact.  Moves all 4 extremities freely PSYCH:  Cognitively intact, oriented to person place and time    ASSESSMENT & PLAN:    # Syncope:  Unclear what caused the episode.  Atrial tachycardia seen on event monitors.  Planning for DCCV 5/14.  # Atrial tachycardia;  Planning for DCCV as above.  Continue metoprolol.  # Acute on chronic systolic and diastolic heart failure: NICM.  She is euvolemic.  Echo 02/2019 revealed LVEF 30-35%.  She is agreeable to getting CRT-D after her second COVID vaccine.  Will plan for early April.  Continue metoprolol succinate, digoxin, and enalapril.  BP is low so we will not add Entresto or spironolactone.  # Asymptomatic CAD: Noted on chest CT.  Continue pitavastatin. LDL goal <70  # Hyperlipidemia: Ms. Spruell is on pitavastatin 2mg  daily.  LDL was 72 pm 02/2019  # Persistent atrial fibrillation: H/o atrial fibrillation s/p remote ablation.  Continue metoprolol and digoxin.  She is hesitant to stop Eliquis.    # Tobacco abuse: Wants to cut back again.   COVID-19 Education: The signs and symptoms of COVID-19 were discussed with the patient and how to seek care for testing (follow up with PCP or arrange E-visit).  The importance of social distancing was discussed today.  Time:   Today, I have spent 30 minutes with the patient with telehealth technology discussing the above problems.     Medication Adjustments/Labs and Tests Ordered: Current medicines are reviewed at length  with the patient today.  Concerns regarding medicines are outlined above.   Tests Ordered: No orders of the defined types were placed in this encounter.   Medication Changes: No orders of the defined types were placed in this encounter.   Disposition:  Follow up in 4 month(s)  Signed, [name], MD  09/30/2019 10:31 AM    [place]

## 2019-10-04 ENCOUNTER — Telehealth: Payer: Self-pay | Admitting: Internal Medicine

## 2019-10-04 NOTE — Chronic Care Management (AMB) (Signed)
  Chronic Care Management   Note  10/04/2019 Name: [name] MRN: [id number] DOB: 01/16/1946  [name] is a 74 y.o. year old female who is a primary care patient of [name], MD. I reached out to [name] by phone today in response to a referral sent by [name]'s health plan.     [name] was given information about Chronic Care Management services today including:  1. CCM service includes personalized support from designated clinical staff supervised by her physician, including individualized plan of care and coordination with other care providers 2. 24/7 contact phone numbers for assistance for urgent and routine care needs. 3. Service will only be billed when office clinical staff spend 20 minutes or more in a month to coordinate care. 4. Only one practitioner may furnish and bill the service in a calendar month. 5. The patient may stop CCM services at any time (effective at the end of the month) by phone call to the office staff. 6. The patient will be responsible for cost sharing (co-pay) of up to 20% of the service fee (after annual deductible is met).  Patient did not agree to services and wishes to consider information provided before deciding about enrollment in care management services.   Follow up plan: The patient has been provided with contact information for the care management team and has been advised to call with any health related questions or concerns.  If patient returns call to provider office, please advise to call [facility] [place] at [phone].  [address] KitchenBell, [place] [address] Direct Dial: [phone] [email or web address]: [email or web address]

## 2019-10-12 ENCOUNTER — Other Ambulatory Visit (HOSPITAL_COMMUNITY)
Admission: RE | Admit: 2019-10-12 | Discharge: 2019-10-12 | Disposition: A | Discharge status: Home / Self Care | Payer: Medicare Other | Source: Ambulatory Visit | Attending: Cardiovascular Disease | Admitting: Cardiovascular Disease

## 2019-10-12 DIAGNOSIS — Z01812 Encounter for preprocedural laboratory examination: Secondary | ICD-10-CM | POA: Insufficient documentation

## 2019-10-12 DIAGNOSIS — I471 Supraventricular tachycardia: Secondary | ICD-10-CM | POA: Diagnosis not present

## 2019-10-12 DIAGNOSIS — Z20822 Contact with and (suspected) exposure to covid-19: Secondary | ICD-10-CM | POA: Diagnosis not present

## 2019-10-12 DIAGNOSIS — I10 Essential (primary) hypertension: Secondary | ICD-10-CM | POA: Diagnosis not present

## 2019-10-12 LAB — SARS CORONAVIRUS 2 (TAT 6-24 HRS): SARS Coronavirus 2: NEGATIVE

## 2019-10-12 NOTE — Addendum Note (Signed)
Addended by: Alvina Filbert B on: 10/12/2019 02:02 PM   Modules accepted: Orders

## 2019-10-13 LAB — BASIC METABOLIC PANEL
BUN/Creatinine Ratio: 18 (ref 12–28)
BUN: 24 mg/dL (ref 8–27)
CO2: 22 mmol/L (ref 20–29)
Calcium: 9.5 mg/dL (ref 8.7–10.3)
Chloride: 103 mmol/L (ref 96–106)
Creatinine, Ser: 1.34 mg/dL — ABNORMAL HIGH (ref 0.57–1.00)
GFR calc Af Amer: 45 mL/min/{1.73_m2} — ABNORMAL LOW (ref >59)
GFR calc non Af Amer: 39 mL/min/{1.73_m2} — ABNORMAL LOW (ref >59)
Glucose: 89 mg/dL (ref 65–99)
Potassium: 4 mmol/L (ref 3.5–5.2)
Sodium: 141 mmol/L (ref 134–144)

## 2019-10-13 LAB — CBC WITH DIFFERENTIAL/PLATELET
Basophils Absolute: 0.1 10*3/uL (ref 0.0–0.2)
Basos: 1 %
EOS (ABSOLUTE): 0.1 10*3/uL (ref 0.0–0.4)
Eos: 1 %
Hematocrit: 42.1 % (ref 34.0–46.6)
Hemoglobin: 13.2 g/dL (ref 11.1–15.9)
Immature Grans (Abs): 0 10*3/uL (ref 0.0–0.1)
Immature Granulocytes: 1 %
Lymphocytes Absolute: 2.9 10*3/uL (ref 0.7–3.1)
Lymphs: 33 %
MCH: 26.5 pg — ABNORMAL LOW (ref 26.6–33.0)
MCHC: 31.4 g/dL — ABNORMAL LOW (ref 31.5–35.7)
MCV: 85 fL (ref 79–97)
Monocytes Absolute: 0.6 10*3/uL (ref 0.1–0.9)
Monocytes: 6 %
Neutrophils Absolute: 5.2 10*3/uL (ref 1.4–7.0)
Neutrophils: 58 %
Platelets: 200 10*3/uL (ref 150–450)
RBC: 4.98 x10E6/uL (ref 3.77–5.28)
RDW: 13.4 % (ref 11.7–15.4)
WBC: 8.8 10*3/uL (ref 3.4–10.8)

## 2019-10-14 ENCOUNTER — Other Ambulatory Visit: Payer: Self-pay

## 2019-10-15 ENCOUNTER — Encounter (HOSPITAL_COMMUNITY): Payer: Self-pay | Admitting: Cardiovascular Disease

## 2019-10-15 ENCOUNTER — Ambulatory Visit (HOSPITAL_COMMUNITY): Payer: Medicare Other | Admitting: Certified Registered Nurse Anesthetist

## 2019-10-15 ENCOUNTER — Telehealth: Payer: Self-pay

## 2019-10-15 ENCOUNTER — Ambulatory Visit (HOSPITAL_COMMUNITY)
Admission: RE | Admit: 2019-10-15 | Discharge: 2019-10-15 | Disposition: A | Discharge status: Home / Self Care | Payer: Medicare Other | Attending: Cardiovascular Disease | Admitting: Cardiovascular Disease

## 2019-10-15 ENCOUNTER — Encounter (HOSPITAL_COMMUNITY)
Admission: RE | Disposition: A | Discharge status: Home / Self Care | Payer: Medicare Other | Source: Home / Self Care | Attending: Cardiovascular Disease

## 2019-10-15 DIAGNOSIS — I130 Hypertensive heart and chronic kidney disease with heart failure and stage 1 through stage 4 chronic kidney disease, or unspecified chronic kidney disease: Secondary | ICD-10-CM | POA: Insufficient documentation

## 2019-10-15 DIAGNOSIS — E1122 Type 2 diabetes mellitus with diabetic chronic kidney disease: Secondary | ICD-10-CM | POA: Diagnosis not present

## 2019-10-15 DIAGNOSIS — M109 Gout, unspecified: Secondary | ICD-10-CM | POA: Insufficient documentation

## 2019-10-15 DIAGNOSIS — I5043 Acute on chronic combined systolic (congestive) and diastolic (congestive) heart failure: Secondary | ICD-10-CM | POA: Diagnosis not present

## 2019-10-15 DIAGNOSIS — E785 Hyperlipidemia, unspecified: Secondary | ICD-10-CM | POA: Diagnosis not present

## 2019-10-15 DIAGNOSIS — Z880 Allergy status to penicillin: Secondary | ICD-10-CM | POA: Insufficient documentation

## 2019-10-15 DIAGNOSIS — I509 Heart failure, unspecified: Secondary | ICD-10-CM | POA: Diagnosis not present

## 2019-10-15 DIAGNOSIS — N183 Chronic kidney disease, stage 3 unspecified: Secondary | ICD-10-CM | POA: Diagnosis not present

## 2019-10-15 DIAGNOSIS — K219 Gastro-esophageal reflux disease without esophagitis: Secondary | ICD-10-CM | POA: Insufficient documentation

## 2019-10-15 DIAGNOSIS — I428 Other cardiomyopathies: Secondary | ICD-10-CM | POA: Insufficient documentation

## 2019-10-15 DIAGNOSIS — Z79899 Other long term (current) drug therapy: Secondary | ICD-10-CM | POA: Insufficient documentation

## 2019-10-15 DIAGNOSIS — Z7901 Long term (current) use of anticoagulants: Secondary | ICD-10-CM | POA: Insufficient documentation

## 2019-10-15 DIAGNOSIS — Z885 Allergy status to narcotic agent status: Secondary | ICD-10-CM | POA: Diagnosis not present

## 2019-10-15 DIAGNOSIS — I2510 Atherosclerotic heart disease of native coronary artery without angina pectoris: Secondary | ICD-10-CM | POA: Diagnosis not present

## 2019-10-15 DIAGNOSIS — I471 Supraventricular tachycardia: Secondary | ICD-10-CM

## 2019-10-15 DIAGNOSIS — I4819 Other persistent atrial fibrillation: Secondary | ICD-10-CM | POA: Insufficient documentation

## 2019-10-15 DIAGNOSIS — I071 Rheumatic tricuspid insufficiency: Secondary | ICD-10-CM | POA: Insufficient documentation

## 2019-10-15 DIAGNOSIS — R55 Syncope and collapse: Secondary | ICD-10-CM | POA: Insufficient documentation

## 2019-10-15 DIAGNOSIS — I4891 Unspecified atrial fibrillation: Secondary | ICD-10-CM | POA: Diagnosis not present

## 2019-10-15 DIAGNOSIS — Z881 Allergy status to other antibiotic agents status: Secondary | ICD-10-CM | POA: Diagnosis not present

## 2019-10-15 DIAGNOSIS — F17210 Nicotine dependence, cigarettes, uncomplicated: Secondary | ICD-10-CM | POA: Insufficient documentation

## 2019-10-15 DIAGNOSIS — N189 Chronic kidney disease, unspecified: Secondary | ICD-10-CM | POA: Diagnosis not present

## 2019-10-15 HISTORY — PX: CARDIOVERSION: SHX1299

## 2019-10-15 LAB — GLUCOSE, CAPILLARY: Glucose-Capillary: 104 mg/dL — ABNORMAL HIGH (ref 70–99)

## 2019-10-15 SURGERY — CARDIOVERSION
Anesthesia: General

## 2019-10-15 MED ORDER — PROPOFOL 10 MG/ML IV BOLUS
INTRAVENOUS | Status: DC | PRN
  Administered 2019-10-15: 60 mg via INTRAVENOUS

## 2019-10-15 MED ORDER — LIDOCAINE 2% (20 MG/ML) 5 ML SYRINGE
INTRAMUSCULAR | Status: DC | PRN
  Administered 2019-10-15: 40 mg via INTRAVENOUS

## 2019-10-15 MED ORDER — SODIUM CHLORIDE 0.9 % IV SOLN
INTRAVENOUS | Status: AC | PRN
  Administered 2019-10-15: 500 mL via INTRAVENOUS

## 2019-10-15 NOTE — Anesthesia Procedure Notes (Signed)
Procedure Name: General with mask airway Date/Time: 10/15/2019 9:07 AM Performed by: Janene Harvey, CRNA Pre-anesthesia Checklist: Patient identified, Emergency Drugs available, Suction available and Patient being monitored Patient Re-evaluated:Patient Re-evaluated prior to induction Oxygen Delivery Method: Ambu bag Placement Confirmation: positive ETCO2 Dental Injury: Teeth and Oropharynx as per pre-operative assessment

## 2019-10-15 NOTE — Transfer of Care (Signed)
Immediate Anesthesia Transfer of Care Note  Patient: [name]  Procedure(s) Performed: CARDIOVERSION (N/A )  Patient Location: Endoscopy Unit  Anesthesia Type:General  Level of Consciousness: drowsy  Airway & Oxygen Therapy: Patient Spontanous Breathing and Patient connected to nasal cannula oxygen  Post-op Assessment: Report given to RN and Post -op Vital signs reviewed and stable  Post vital signs: Reviewed  Last Vitals:  Vitals Value Taken Time  BP    Temp    Pulse    Resp    SpO2      Last Pain:  Vitals:   10/15/19 0813  TempSrc: (P) Oral  PainSc: 0-No pain         Complications: No apparent anesthesia complications

## 2019-10-15 NOTE — Anesthesia Postprocedure Evaluation (Signed)
Anesthesia Post Note  Patient: [name]  Procedure(s) Performed: CARDIOVERSION (N/A )     Patient location during evaluation: Endoscopy Anesthesia Type: General Level of consciousness: awake and alert Pain management: pain level controlled Vital Signs Assessment: post-procedure vital signs reviewed and stable Respiratory status: spontaneous breathing, nonlabored ventilation, respiratory function stable and patient connected to nasal cannula oxygen Cardiovascular status: blood pressure returned to baseline and stable Postop Assessment: no apparent nausea or vomiting Anesthetic complications: no    Last Vitals:  Vitals:   10/15/19 0930 10/15/19 0940  BP: 128/68 125/66  Pulse: 71 72  Resp: 14 19  Temp:    SpO2: 100% 100%    Last Pain:  Vitals:   10/15/19 0940  TempSrc:   PainSc: 0-No pain                 Tayvion Lauder COKER

## 2019-10-15 NOTE — Anesthesia Preprocedure Evaluation (Signed)
Anesthesia Evaluation  Patient identified by MRN, date of birth, ID band Patient awake    Reviewed: Allergy & Precautions, NPO status , Patient's Chart, lab work & pertinent test results  Airway Mallampati: II  TM Distance: >3 FB     Dental  (+) Teeth Intact   Pulmonary Current Smoker and Patient abstained from smoking.,    breath sounds clear to auscultation       Cardiovascular hypertension,  Rhythm:Irregular Rate:Normal     Neuro/Psych    GI/Hepatic   Endo/Other  diabetes  Renal/GU      Musculoskeletal   Abdominal   Peds  Hematology   Anesthesia Other Findings   Reproductive/Obstetrics                             Anesthesia Physical Anesthesia Plan  ASA: III  Anesthesia Plan: General   Post-op Pain Management:    Induction: Intravenous  PONV Risk Score and Plan:   Airway Management Planned: Mask  Additional Equipment:   Intra-op Plan:   Post-operative Plan:   Informed Consent: I have reviewed the patients History and Physical, chart, labs and discussed the procedure including the risks, benefits and alternatives for the proposed anesthesia with the patient or authorized representative who has indicated his/her understanding and acceptance.       Plan Discussed with: CRNA and Anesthesiologist  Anesthesia Plan Comments:         Anesthesia Quick Evaluation

## 2019-10-15 NOTE — Discharge Instructions (Signed)
Electrical Cardioversion   What can I expect after the procedure?  Your blood pressure, heart rate, breathing rate, and blood oxygen level will be monitored until you leave the hospital or clinic.  Your heart rhythm will be watched to make sure it does not change.  You may have some redness on the skin where the shocks were given. Follow these instructions at home:  Do not drive for 24 hours if you were given a sedative during your procedure.  Take over-the-counter and prescription medicines only as told by your health care provider.  Ask your health care provider how to check your pulse. Check it often.  Rest for 48 hours after the procedure or as told by your health care provider.  Avoid or limit your caffeine use as told by your health care provider.  Keep all follow-up visits as told by your health care provider. This is important. Contact a health care provider if:  You feel like your heart is beating too quickly or your pulse is not regular.  You have a serious muscle cramp that does not go away. Get help right away if:  You have discomfort in your chest.  You are dizzy or you feel faint.  You have trouble breathing or you are short of breath.  Your speech is slurred.  You have trouble moving an arm or leg on one side of your body.  Your fingers or toes turn cold or blue. Summary  Electrical cardioversion is the delivery of a jolt of electricity to restore a normal rhythm to the heart.  This procedure may be done right away in an emergency or may be a scheduled procedure if the condition is not an emergency.  Generally, this is a safe procedure.  After the procedure, check your pulse often as told by your health care provider. This information is not intended to replace advice given to you by your health care provider. Make sure you discuss any questions you have with your health care provider. Document Revised: 12/21/2018 Document Reviewed:  12/21/2018 Elsevier Patient Education  2020 Elsevier Inc.  

## 2019-10-15 NOTE — Telephone Encounter (Signed)
The pt left a message that she was having a cardioversion and wanted to ask Dr. Baird Cancer a personal question before she has her procedure.  I attempted to call the patient this morning and left a message that the office closed early yesterday, that I was trying to catch her before she left for her appt, Dr.Sanders is out of the office until next Wednesday and for the pt to call me back when she gets time.

## 2019-10-15 NOTE — Interval H&P Note (Signed)
History and Physical Interval Note:  10/15/2019 8:42 AM  [name]  has presented today for surgery, with the diagnosis of A-FIB.  The various methods of treatment have been discussed with the patient and family. After consideration of risks, benefits and other options for treatment, the patient has consented to  Procedure(s): CARDIOVERSION (N/A) as a surgical intervention.  The patient's history has been reviewed, patient examined, no change in status, stable for surgery.  I have reviewed the patient's chart and labs.  Questions were answered to the patient's satisfaction.     [name], MD

## 2019-10-22 ENCOUNTER — Other Ambulatory Visit: Payer: Self-pay | Admitting: Cardiovascular Disease

## 2019-10-25 ENCOUNTER — Other Ambulatory Visit: Payer: Self-pay | Admitting: Interventional Cardiology

## 2019-10-25 NOTE — Telephone Encounter (Signed)
Rx request sent to pharmacy.  

## 2019-11-02 ENCOUNTER — Ambulatory Visit: Payer: Medicare Other | Admitting: Student

## 2019-11-09 LAB — HM DIABETES EYE EXAM

## 2019-11-19 NOTE — CV Procedure (Signed)
Electrical Cardioversion Procedure Note KERRIANN KAMPHUIS [id number] 1946/04/11  Procedure: Electrical Cardioversion Indications:  Atrial Tachycardia  Procedure Details Consent: Risks of procedure as well as the alternatives and risks of each were explained to the (patient/caregiver).  Consent for procedure obtained. Time Out: Verified patient identification, verified procedure, site/side was marked, verified correct patient position, special equipment/implants available, medications/allergies/relevent history reviewed, required imaging and test results available.  Performed  Patient placed on cardiac monitor, pulse oximetry, supplemental oxygen as necessary.  Sedation given: profolol Pacer pads placed anterior and posterior chest.  Cardioverted 1 time(s).  Cardioverted at 150J.  Evaluation Findings: Post procedure EKG shows: NSR Complications: None Patient did tolerate procedure well.   [name] 11/19/2019, 9:23 AM

## 2019-11-23 ENCOUNTER — Encounter: Payer: Self-pay | Admitting: Internal Medicine

## 2019-11-23 ENCOUNTER — Telehealth: Payer: Self-pay

## 2019-11-23 NOTE — Telephone Encounter (Signed)
I called the pt to check on her because Evelena Peat, RN with [facility] called and said that she was sending the pt to the ER by EMS because the pt was having right hand pain, tingling with warm to the touch from the elbow down.  The pt said that EMS checked her out, she is doing ok and that she didn't go because she didn't want to create the bill, the pt was offered an appt and she said that she doesn't have a ride and if it happens again she will call the EMS

## 2019-11-25 ENCOUNTER — Telehealth: Payer: Self-pay

## 2019-11-25 NOTE — Telephone Encounter (Signed)
The pt was notified that Dr. Bobetta Lime said that the pt needed an evaluation for the bug bite before she could send an antibiotic for pain medication and for the pt to use neosporin. The pt said that the paramedics checked her hand and that they didn't see any punctures in her skin and that she will have to call back to schedule an appt when she can find some one to bring her.

## 2019-11-25 NOTE — Telephone Encounter (Signed)
The pt called back and said that she can't find anyone to bring her for an appt today for an evaluation.

## 2019-11-30 ENCOUNTER — Other Ambulatory Visit: Payer: Self-pay

## 2019-11-30 ENCOUNTER — Encounter (HOSPITAL_BASED_OUTPATIENT_CLINIC_OR_DEPARTMENT_OTHER): Payer: Self-pay | Admitting: *Deleted

## 2019-11-30 ENCOUNTER — Emergency Department (HOSPITAL_BASED_OUTPATIENT_CLINIC_OR_DEPARTMENT_OTHER)
Admission: EM | Admit: 2019-11-30 | Discharge: 2019-11-30 | Disposition: A | Discharge status: Home / Self Care | Payer: Medicare Other | Attending: Emergency Medicine | Admitting: Emergency Medicine

## 2019-11-30 ENCOUNTER — Emergency Department (HOSPITAL_BASED_OUTPATIENT_CLINIC_OR_DEPARTMENT_OTHER): Payer: Medicare Other

## 2019-11-30 DIAGNOSIS — E1122 Type 2 diabetes mellitus with diabetic chronic kidney disease: Secondary | ICD-10-CM | POA: Diagnosis not present

## 2019-11-30 DIAGNOSIS — I509 Heart failure, unspecified: Secondary | ICD-10-CM | POA: Insufficient documentation

## 2019-11-30 DIAGNOSIS — N189 Chronic kidney disease, unspecified: Secondary | ICD-10-CM | POA: Insufficient documentation

## 2019-11-30 DIAGNOSIS — I130 Hypertensive heart and chronic kidney disease with heart failure and stage 1 through stage 4 chronic kidney disease, or unspecified chronic kidney disease: Secondary | ICD-10-CM | POA: Insufficient documentation

## 2019-11-30 DIAGNOSIS — M659 Synovitis and tenosynovitis, unspecified: Secondary | ICD-10-CM

## 2019-11-30 DIAGNOSIS — F17210 Nicotine dependence, cigarettes, uncomplicated: Secondary | ICD-10-CM | POA: Insufficient documentation

## 2019-11-30 DIAGNOSIS — M79641 Pain in right hand: Secondary | ICD-10-CM | POA: Diagnosis not present

## 2019-11-30 DIAGNOSIS — Z79899 Other long term (current) drug therapy: Secondary | ICD-10-CM | POA: Insufficient documentation

## 2019-11-30 DIAGNOSIS — M65841 Other synovitis and tenosynovitis, right hand: Secondary | ICD-10-CM | POA: Diagnosis not present

## 2019-11-30 MED ORDER — DICLOFENAC SODIUM 1 % EX GEL: 4.0000 g | Freq: Four times a day (QID) | CUTANEOUS | 0 refills | Status: AC

## 2019-11-30 NOTE — ED Triage Notes (Signed)
Right hand has been swollen and painful for a week. Hx of afib and diabetes. She is taking Eliquis due to afib.

## 2019-11-30 NOTE — ED Provider Notes (Signed)
[name] Provider Note   CSN: [id number] Arrival date & time: 11/30/19  1433     History No chief complaint on file.   [name] is a 74 y.o. female.  74 yo F with a chief complaints of right hand pain.  This been going on for about a week and a half now.  Denies any injury denies overuse.  States she woke up with one day and it was like that.  Denies fevers or chills states it has been significantly swollen.  Tried over-the-counter medicine with minimal improvement.  Feels got to the point where she is had trouble grasping things or using her hand.  The history is provided by the patient.  Illness Severity:  Moderate Onset quality:  Gradual Duration:  2 weeks Timing:  Constant Progression:  Worsening Chronicity:  New Associated symptoms: myalgias   Associated symptoms: no chest pain, no congestion, no fever, no headaches, no nausea, no rhinorrhea, no shortness of breath, no vomiting and no wheezing        Past Medical History:  Diagnosis Date  . Arthritis    "left knee" (03/12/2013)  . Atrial fibrillation ([place])    Cardioverted to NSR 08/22/2009  . Atrial tachycardia ([place]) 09/30/2019  . Cardiomyopathy   . CHF (congestive heart failure) ([place])   . CKD (chronic kidney disease)   . Depression   . Dizziness   . DM2 (diabetes mellitus, type 2) ([place])    fasting 80-100 (03/12/2013)  . Drug therapy 1/11   coumadin  . Ejection fraction < 50%    20-25%, echo.06/2009, /  EF 30-35%, diffuse hypokinesis, echo, Oct 16, 2010  . GERD (gastroesophageal reflux disease)   . Gout    "left knee" (03/12/2013)  . History of bronchitis   . History of emphysema   . HLD (hyperlipidemia)   . Hyperkalemia   . Hypertension   . LFT elevation    06/2009  . Mitral regurgitation    mild - echo - 1/11 /  Mild, echo, May15, 2012  . Overweight(278.02)   . Pneumonia    "@ least twice" (03/12/2013)  . Thyroid dysfunction    thyromegally,diffuse,nodule  LLL,06/2009  . Tobacco abuse   . Tricuspid regurgitation    moderate - echo - 1/11    Patient Active Problem List   Diagnosis Date Noted  . Atrial tachycardia ([place]) 09/30/2019  . Syncope and collapse 07/26/2019  . Essential hypertension 11/12/2016  . Belching 08/26/2014  . Left bundle branch block (LBBB) 06/06/2014  . Lower GI bleeding 05/16/2013  . Lower GI bleed 05/16/2013  . Tobacco abuse   . Furunculosis of multiple sites 08/19/2011  . Rash, skin 08/19/2011  . DM (diabetes mellitus) type II uncontrolled with renal manifestation 08/17/2011  . Dehydration 08/17/2011  . Acute kidney injury ([place]) 08/17/2011  . CKD (chronic kidney disease) 08/17/2011  . NICM (nonischemic cardiomyopathy) ([place])   . Mitral regurgitation   . Paroxysmal atrial fibrillation (HCC)   . Tricuspid regurgitation   . Thyroid dysfunction   . LFT elevation   . Long term current use of anticoagulant 07/16/2010  . HYPERKALEMIA 08/15/2009  . OVERWEIGHT/OBESITY 08/15/2009  . DM (diabetes mellitus) ([place]) 10/02/2007  . HYPERLIPIDEMIA 10/02/2007  . DEPRESSION 10/02/2007  . BRONCHITIS 10/02/2007  . EMPHYSEMA 10/02/2007  . GERD 10/02/2007  . BREAST BIOPSY, HX OF 10/02/2007    Past Surgical History:  Procedure Laterality Date  . BREAST BIOPSY Right 1980's  . BREAST LUMPECTOMY Right  1980's  . CARDIOVERSION N/A 10/15/2019   Procedure: CARDIOVERSION;  Surgeon: [name], MD;  Location: [place];  Service: Cardiovascular;  Laterality: N/A;  . CATARACT EXTRACTION W/ INTRAOCULAR LENS  IMPLANT, BILATERAL Bilateral 2010-2012  . CHOLECYSTECTOMY  ~ 1978  . JOINT REPLACEMENT    . TONSILLECTOMY  1960's  . TOTAL KNEE ARTHROPLASTY Left 03/12/2013  . TOTAL KNEE ARTHROPLASTY Left 03/12/2013   Procedure: TOTAL KNEE ARTHROPLASTY;  Surgeon: Newt Minion, MD;  Location: [place];  Service: Orthopedics;  Laterality: Left;  Left total knee arthroplasty  . TUBAL LIGATION    . VAGINAL HYSTERECTOMY  1987     OB  History   No obstetric history on file.     Family History  Problem Relation Age of Onset  . Stroke Mother   . Prostate cancer Father   . Colon cancer Other   . Heart disease Other   . Heart attack Son   . Hypertension Sister   . Hypertension Son        X79  . Stroke Maternal Grandfather   . Stroke Paternal Grandmother     Social History   Tobacco Use  . Smoking status: Current Every Day Smoker    Packs/day: 0.50    Years: 40.00    Pack years: 20.00    Types: Cigarettes  . Smokeless tobacco: Never Used  . Tobacco comment: smoked 1.5ppd x 9, 1ppdx40, cut back to 1/2ppd past 5 years  Vaping Use  . Vaping Use: Never used  Substance Use Topics  . Alcohol use: No  . Drug use: No    Home Medications Prior to Admission medications   Medication Sig Start Date End Date Taking? Authorizing Provider  acetaminophen (TYLENOL) 500 MG tablet Take 1,000 mg by mouth 3 (three) times daily as needed for pain.    [provider]  BD PEN NEEDLE NANO U/F 32G X 4 MM MISC USE AS DIRECTED WITH INSULIN PENS 09/01/18   [name], MD  betamethasone dipropionate 0.05 % cream Apply 1 application topically 2 (two) times daily as needed (dry skin).    [provider]  Cholecalciferol (VITAMIN D) 2000 UNITS CAPS Take 2,000 Units by mouth daily.    [provider]  colchicine 0.6 MG tablet Take 0.6 mg by mouth 2 (two) times daily as needed (GOUT).     [provider]  diclofenac Sodium (VOLTAREN) 1 % GEL Apply 4 g topically 4 (four) times daily. 11/30/19   Deno Etienne, DO  digoxin (LANOXIN) 0.125 MG tablet TAKE 1 TABLET BY MOUTH DAILY Patient taking differently: Take 0.125 mg by mouth daily.  10/01/19   [name], MD  ELIQUIS 5 MG TABS tablet TAKE 1 TABLET BY MOUTH TWICE A DAY Patient taking differently: Take 5 mg by mouth 2 (two) times daily.  06/28/19   [name], MD  enalapril (VASOTEC) 5 MG tablet Take 1 tablet (5 mg total) by mouth at bedtime.  10/25/19   [name], MD  furosemide (LASIX) 40 MG tablet TAKE 1 TABLET BY MOUITH DAILY AS NEEDED FOR EDEMA FOR FLUID OVERLOAD 10/25/19   Deboraha Sprang, MD  KLOR-CON M20 20 MEQ tablet TAKE 1 TABLET (20 MEQ TOTAL) BY MOUTH DAILY. WHEN TAKING FUROSEMIDE (LASIX). Patient taking differently: Take 20 mEq by mouth daily as needed. When taking double  furosemide (Lasix). 03/13/17 10/07/19  Jettie Booze, MD  liraglutide (VICTOZA) 18 MG/3ML SOPN Inject 0.2 mLs (1.2 mg total) into the skin daily. Patient  taking differently: Inject 0.6 mg into the skin daily.  02/25/19   [name], MD  metoprolol succinate (TOPROL-XL) 50 MG 24 hr tablet TAKE 1 TABLET (50 MG TOTAL) BY MOUTH DAILY. TAKE WITH OR IMMEDIATELY FOLLOWING A MEAL. 09/23/19 12/22/19  [name], MD  Multiple Vitamin (MULITIVITAMIN WITH MINERALS) TABS Take 1 tablet by mouth daily. Centrum Silver    [provider]  nystatin cream (MYCOSTATIN) Apply 1 application topically 2 (two) times daily as needed for dry skin (under breasts).    [provider]  [facility] VERIO test strip USE AS DIRECTED TO CHECK BLOOD SUGARS 1 TIME PER DAY DX: E11.22 01/18/19   [name], MD  pantoprazole (PROTONIX) 20 MG tablet Take 20 mg by mouth daily as needed for heartburn.     [provider]  Pitavastatin Calcium (LIVALO) 2 MG TABS Take 2 mg by mouth See admin instructions. Pt take 1 tablet M-W-F     [provider]  Polyethyl Glycol-Propyl Glycol (SYSTANE ULTRA) 0.4-0.3 % SOLN Place 1 drop into both eyes daily as needed (dry eyes).    [provider]  pravastatin (PRAVACHOL) 40 MG tablet Take 1 tablet by mouth once per week 06/09/19   [name], MD  vitamin C (ASCORBIC ACID) 500 MG tablet Take 500 mg by mouth daily.    [provider]    Allergies    Codeine, Augmentin [amoxicillin-pot clavulanate], Coreg [carvedilol], and Warfarin and related  Review of Systems   Review of Systems    Constitutional: Negative for chills and fever.  HENT: Negative for congestion and rhinorrhea.   Eyes: Negative for redness and visual disturbance.  Respiratory: Negative for shortness of breath and wheezing.   Cardiovascular: Negative for chest pain and palpitations.  Gastrointestinal: Negative for nausea and vomiting.  Genitourinary: Negative for dysuria and urgency.  Musculoskeletal: Positive for arthralgias and myalgias.  Skin: Negative for pallor and wound.  Neurological: Negative for dizziness and headaches.    Physical Exam Updated Vital Signs BP 116/63   Pulse 81   Temp 98.1 F (36.7 C) (Oral)   Resp 20   Ht 5\' 7"  (1.702 m)   Wt 103.4 kg   SpO2 98%   BMI 35.71 kg/m   Physical Exam Vitals and nursing note reviewed.  Constitutional:      General: She is not in acute distress.    Appearance: She is well-developed. She is not diaphoretic.  HENT:     Head: Normocephalic and atraumatic.  Eyes:     Pupils: Pupils are equal, round, and reactive to light.  Cardiovascular:     Rate and Rhythm: Normal rate and regular rhythm.     Heart sounds: No murmur heard.  No friction rub. No gallop.   Pulmonary:     Effort: Pulmonary effort is normal.     Breath sounds: No wheezing or rales.  Abdominal:     General: There is no distension.     Palpations: Abdomen is soft.     Tenderness: There is no abdominal tenderness.  Musculoskeletal:        General: Tenderness present.     Cervical back: Normal range of motion and neck supple.     Comments: Tenderness and swelling to the right hand.  Worse to the dorsum.  Pulse motor and sensation are intact.  Able to range the wrist without significant tenderness.  No snuffbox tenderness.  No significant ligamentous laxity to the thumb.  Positive Finkelstein test.  Skin:  General: Skin is warm and dry.  Neurological:     Mental Status: She is alert and oriented to person, place, and time.  Psychiatric:        Behavior: Behavior  normal.     ED Results / Procedures / Treatments   Labs (all labs ordered are listed, but only abnormal results are displayed) Labs Reviewed - No data to display  EKG None  Radiology DG Hand Complete Right  Result Date: 11/30/2019 CLINICAL DATA:  RIGHT hand pain and swelling for 2 days, no known injury EXAM: RIGHT HAND - COMPLETE 3+ VIEW COMPARISON:  None FINDINGS: Osseous mineralization mildly decreased. Advanced degenerative changes at first [facility] joint with joint space narrowing, spur formation and radial subluxation of first metacarpal base. Additional spurring arising from the triquetrum. Chondrocalcinosis at TFCC question CPPD. Mild radiocarpal joint space narrowing. No acute fracture, dislocation, or bone destruction. Scattered atherosclerotic calcifications. IMPRESSION: Degenerative changes at RIGHT carpus with question CPPD. No acute osseous abnormalities. Electronically Signed   By: Lavonia Dana M.D.   On: 11/30/2019 15:29    Procedures Procedures (including critical care time)  Medications Ordered in ED Medications - No data to display  ED Course  I have reviewed the triage vital signs and the nursing notes.  Pertinent labs & imaging results that were available during my care of the patient were reviewed by me and considered in my medical decision making (see chart for details).    MDM Rules/Calculators/A&P                          74 yo F with a chief complaints of right wrist pain.  Going on for about a week and a half.  I doubt this is septic arthritis as I am able to range it without significant pain.  We will place in a splint.  Have the patient follow-up with the family doctor.  Plain film with some concern for calcium phosphate deposition disease.  Have her follow-up with her family doctor.  4:51 PM:  I have discussed the diagnosis/risks/treatment options with the patient and believe the pt to be eligible for discharge home to follow-up with PCP. We also discussed  returning to the ED immediately if new or worsening sx occur. We discussed the sx which are most concerning (e.g., sudden worsening pain, fever, inability to tolerate by mouth) that necessitate immediate return. Medications administered to the patient during their visit and any new prescriptions provided to the patient are listed below.  Medications given during this visit Medications - No data to display   The patient appears reasonably screen and/or stabilized for discharge and I doubt any other medical condition or other [facility] requiring further screening, evaluation, or treatment in the ED at this time prior to discharge.   Final Clinical Impression(s) / ED Diagnoses Final diagnoses:  Flexor tenosynovitis of thumb    Rx / DC Orders ED Discharge Orders         Ordered    diclofenac Sodium (VOLTAREN) 1 % GEL  4 times daily     Discontinue  Reprint     11/30/19 [place], Jan Olano, DO 11/30/19 1651

## 2019-11-30 NOTE — Discharge Instructions (Signed)
Use the gel as prescribed. Also take tylenol 1000mg (2 extra strength) four times a day.   As we discussed before please return if the pain gets much worse or gets red or if you develop a fever.

## 2019-12-07 ENCOUNTER — Telehealth: Payer: Self-pay

## 2019-12-07 NOTE — Telephone Encounter (Signed)
The pt rescheduled her appt for this week because she went to [facility] and they gave her a hand splint and some diclofenac, the brace helps her pain some.

## 2019-12-09 ENCOUNTER — Encounter: Payer: Self-pay | Admitting: Internal Medicine

## 2019-12-09 ENCOUNTER — Ambulatory Visit: Payer: Self-pay | Admitting: Internal Medicine

## 2019-12-16 ENCOUNTER — Ambulatory Visit (INDEPENDENT_AMBULATORY_CARE_PROVIDER_SITE_OTHER): Payer: Medicare Other | Admitting: Internal Medicine

## 2019-12-16 ENCOUNTER — Other Ambulatory Visit: Payer: Self-pay

## 2019-12-16 ENCOUNTER — Encounter: Payer: Self-pay | Admitting: Internal Medicine

## 2019-12-16 VITALS — BP 134/78 | HR 81 | Temp 97.6°F | Ht 66.8 in | Wt 225.8 lb

## 2019-12-16 DIAGNOSIS — Z794 Long term (current) use of insulin: Secondary | ICD-10-CM | POA: Diagnosis not present

## 2019-12-16 DIAGNOSIS — R251 Tremor, unspecified: Secondary | ICD-10-CM

## 2019-12-16 DIAGNOSIS — N183 Chronic kidney disease, stage 3 unspecified: Secondary | ICD-10-CM

## 2019-12-16 DIAGNOSIS — E1122 Type 2 diabetes mellitus with diabetic chronic kidney disease: Secondary | ICD-10-CM | POA: Diagnosis not present

## 2019-12-16 DIAGNOSIS — Z6835 Body mass index (BMI) 35.0-35.9, adult: Secondary | ICD-10-CM

## 2019-12-16 DIAGNOSIS — M79641 Pain in right hand: Secondary | ICD-10-CM

## 2019-12-16 DIAGNOSIS — I130 Hypertensive heart and chronic kidney disease with heart failure and stage 1 through stage 4 chronic kidney disease, or unspecified chronic kidney disease: Secondary | ICD-10-CM

## 2019-12-16 MED ORDER — TRAMADOL HCL 50 MG PO TABS: 50.0000 mg | ORAL_TABLET | Freq: Two times a day (BID) | ORAL | 0 refills | Status: AC | PRN

## 2019-12-16 MED ORDER — TRIAMCINOLONE ACETONIDE 40 MG/ML IJ SUSP
60.0000 mg | Freq: Once | INTRAMUSCULAR | Status: AC
  Administered 2019-12-16: 60 mg via INTRAMUSCULAR

## 2019-12-16 NOTE — Progress Notes (Signed)
I,Tianna Badgett,acting as a Education administrator for Maximino Greenland, MD.,have documented all relevant documentation on the behalf of Maximino Greenland, MD,as directed by  Maximino Greenland, MD while in the presence of Maximino Greenland, MD.  This visit occurred during the SARS-CoV-2 public health emergency.  Safety protocols were in place, including screening questions prior to the visit, additional usage of staff PPE, and extensive cleaning of exam room while observing appropriate contact time as indicated for disinfecting solutions.  Subjective:     Patient ID: [name] , female    DOB: 24-Feb-1946 , 74 y.o.   MRN: [id number]   Chief Complaint  Patient presents with  . Hand Pain    HPI  Started a month ago as she was sitting watching television and just started hurting. Interrupting sleep. She is right handed.  Hand Pain  The incident occurred more than 1 week ago. There was no injury mechanism. The pain is present in the right hand. The quality of the pain is described as aching. The pain radiates to the right arm. The pain is at a severity of 10/10. The pain is severe. The pain has been constant since the incident. Associated symptoms include tingling. The symptoms are aggravated by movement. She has tried acetaminophen for the symptoms. The treatment provided no relief.     Past Medical History:  Diagnosis Date  . Arthritis    "left knee" (03/12/2013)  . Atrial fibrillation ([place])    Cardioverted to NSR 08/22/2009  . Atrial tachycardia ([place]) 09/30/2019  . Cardiomyopathy   . CHF (congestive heart failure) ([place])   . CKD (chronic kidney disease)   . Depression   . Dizziness   . DM2 (diabetes mellitus, type 2) ([place])    fasting 80-100 (03/12/2013)  . Drug therapy 1/11   coumadin  . Ejection fraction < 50%    20-25%, echo.06/2009, /  EF 30-35%, diffuse hypokinesis, echo, Oct 16, 2010  . GERD (gastroesophageal reflux disease)   . Gout    "left knee" (03/12/2013)  . History of bronchitis   .  History of emphysema   . HLD (hyperlipidemia)   . Hyperkalemia   . Hypertension   . LFT elevation    06/2009  . Mitral regurgitation    mild - echo - 1/11 /  Mild, echo, May15, 2012  . Overweight(278.02)   . Pneumonia    "@ least twice" (03/12/2013)  . Thyroid dysfunction    thyromegally,diffuse,nodule LLL,06/2009  . Tobacco abuse   . Tricuspid regurgitation    moderate - echo - 1/11     Family History  Problem Relation Age of Onset  . Stroke Mother   . Prostate cancer Father   . Colon cancer Other   . Heart disease Other   . Heart attack Son   . Hypertension Sister   . Hypertension Son        X66  . Stroke Maternal Grandfather   . Stroke Paternal Grandmother      Current Outpatient Medications:  .  acetaminophen (TYLENOL) 500 MG tablet, Take 1,000 mg by mouth 3 (three) times daily as needed for pain., Disp: , Rfl:  .  BD PEN NEEDLE NANO U/F 32G X 4 MM MISC, USE AS DIRECTED WITH INSULIN PENS, Disp: 100 each, Rfl: 11 .  betamethasone dipropionate 0.05 % cream, Apply 1 application topically 2 (two) times daily as needed (dry skin)., Disp: , Rfl:  .  Cholecalciferol (VITAMIN D) 2000 UNITS CAPS, Take 2,000  Units by mouth daily., Disp: , Rfl:  .  colchicine 0.6 MG tablet, Take 0.6 mg by mouth 2 (two) times daily as needed (GOUT). , Disp: , Rfl:  .  diclofenac Sodium (VOLTAREN) 1 % GEL, Apply 4 g topically 4 (four) times daily., Disp: 100 g, Rfl: 0 .  digoxin (LANOXIN) 0.125 MG tablet, TAKE 1 TABLET BY MOUTH DAILY (Patient taking differently: Take 0.125 mg by mouth daily. ), Disp: 90 tablet, Rfl: 3 .  ELIQUIS 5 MG TABS tablet, TAKE 1 TABLET BY MOUTH TWICE A DAY (Patient taking differently: Take 5 mg by mouth 2 (two) times daily. ), Disp: 180 tablet, Rfl: 1 .  enalapril (VASOTEC) 5 MG tablet, Take 1 tablet (5 mg total) by mouth at bedtime., Disp: 90 tablet, Rfl: 1 .  furosemide (LASIX) 40 MG tablet, TAKE 1 TABLET BY MOUITH DAILY AS NEEDED FOR EDEMA FOR FLUID OVERLOAD, Disp: 90  tablet, Rfl: 2 .  KLOR-CON M20 20 MEQ tablet, TAKE 1 TABLET (20 MEQ TOTAL) BY MOUTH DAILY. WHEN TAKING FUROSEMIDE (LASIX). (Patient taking differently: Take 20 mEq by mouth daily as needed. When taking double  furosemide (Lasix).), Disp: 90 tablet, Rfl: 2 .  liraglutide (VICTOZA) 18 MG/3ML SOPN, Inject 0.2 mLs (1.2 mg total) into the skin daily. (Patient taking differently: Inject 0.6 mg into the skin daily. ), Disp: 6 pen, Rfl: 2 .  metoprolol succinate (TOPROL-XL) 50 MG 24 hr tablet, TAKE 1 TABLET (50 MG TOTAL) BY MOUTH DAILY. TAKE WITH OR IMMEDIATELY FOLLOWING A MEAL., Disp: 90 tablet, Rfl: 3 .  Multiple Vitamin (MULITIVITAMIN WITH MINERALS) TABS, Take 1 tablet by mouth daily. Centrum Silver, Disp: , Rfl:  .  nystatin cream (MYCOSTATIN), Apply 1 application topically 2 (two) times daily as needed for dry skin (under breasts)., Disp: , Rfl:  .  ONETOUCH VERIO test strip, USE AS DIRECTED TO CHECK BLOOD SUGARS 1 TIME PER DAY DX: E11.22, Disp: 50 strip, Rfl: 7 .  pantoprazole (PROTONIX) 20 MG tablet, Take 20 mg by mouth daily as needed for heartburn. , Disp: , Rfl:  .  Pitavastatin Calcium (LIVALO) 2 MG TABS, Take 2 mg by mouth See admin instructions. Pt take 1 tablet M-W-F , Disp: , Rfl:  .  Polyethyl Glycol-Propyl Glycol (SYSTANE ULTRA) 0.4-0.3 % SOLN, Place 1 drop into both eyes daily as needed (dry eyes)., Disp: , Rfl:  .  pravastatin (PRAVACHOL) 40 MG tablet, Take 1 tablet by mouth once per week, Disp: 15 tablet, Rfl: 11 .  vitamin C (ASCORBIC ACID) 500 MG tablet, Take 500 mg by mouth daily., Disp: , Rfl:  .  traMADol (ULTRAM) 50 MG tablet, Take 1 tablet (50 mg total) by mouth every 12 (twelve) hours as needed., Disp: 30 tablet, Rfl: 0   Allergies  Allergen Reactions  . Codeine Hives and Nausea And Vomiting  . Augmentin [Amoxicillin-Pot Clavulanate] Diarrhea  . Coreg [Carvedilol]     No energy  . Warfarin And Related     Severe rectal bleeding     Review of Systems  Constitutional:  Negative.   Respiratory: Negative.   Cardiovascular: Negative.   Gastrointestinal: Negative.   Musculoskeletal: Positive for arthralgias.       She c/o r hand pain  Neurological: Positive for tingling.     Today's Vitals   12/16/19 0936  BP: 134/78  Pulse: 81  Temp: 97.6 F (36.4 C)  TempSrc: Oral  Weight: 225 lb 12.8 oz (102.4 kg)  Height: 5' 6.8" (1.697 m)  PainSc: 10-Worst pain ever   Body mass index is 35.58 kg/m.   Objective:  Physical Exam Vitals and nursing note reviewed.  Constitutional:      Appearance: Normal appearance. She is obese.  HENT:     Head: Normocephalic and atraumatic.  Cardiovascular:     Rate and Rhythm: Normal rate and regular rhythm.     Heart sounds: Normal heart sounds.  Pulmonary:     Effort: Pulmonary effort is normal.     Breath sounds: Normal breath sounds.  Abdominal:     General: Bowel sounds are normal.     Palpations: Abdomen is soft.  Musculoskeletal:     Comments: Tenderness and swelling to the right hand.  Pulse motor and sensation are intact.  No snuffbox tenderness.  No significant ligamentous laxity to the thumb  Neurological:     General: No focal deficit present.     Mental Status: She is alert and oriented to person, place, and time.     Comments: Tremor r hand  Psychiatric:        Mood and Affect: Mood normal.        Behavior: Behavior normal.         Assessment And Plan:     1. Hand pain, right   She was given Kenalog, 70m IM x 1. I will also check arthritis panel. ER notes from 6/29 were reviewed in full detail. Xray results reviewed as well.   - Sed Rate (ESR) - ANA, IFA (with reflex) - CYCLIC CITRUL PEPTIDE ANTIBODY, IGG/IGA - Uric acid - Rheumatoid (RA) Factor - triamcinolone acetonide (KENALOG-40) injection 60 mg  2. Type 2 diabetes mellitus with stage 3 chronic kidney disease, with long-term current use of insulin, unspecified whether stage 3a or 3b CKD (HCC)  Chronic. I will check labs as listed  below.  She is encouraged to avoid sugary beverages. Regarding CKD, she is aware that optimal BP control and adequate hydration is imperative to avoid progression of CKD.   - Hemoglobin A1c - CMP14+EGFR - CBC no Diff  3. Hypertensive heart and renal disease with renal failure, stage 1 through stage 4 or unspecified chronic kidney disease, with heart failure (HCC)  Chronic, fair control. SHE WAS ADVISED TO AVOID PROCESSED, PACKAGED AND CANNED FOODS WHICH TEND TO BE HIGH IN SODIUM.  ALSO ADVISED TO INCREASE DAILY ACTIVITY AND TO MAINTAIN AN OPTIMAL BODY COMPOSITION.  4. Tremor of right hand  New onset. I will check thyroid function. She is agreeable to Neuro eval in future if needed.   - TSH  5. Class 2 severe obesity due to excess calories with serious comorbidity and body mass index (BMI) of 35.0 to 35.9 in adult ([facility]  She is encouraged to initially strive for BMI less than 30 to decrease cardiac risk. She is advised to exercise no less than 150 minutes per week.      Patient was given opportunity to ask questions. Patient verbalized understanding of the plan and was able to repeat key elements of the plan. All questions were answered to their satisfaction.   RMaximino Greenland MD   I, RMaximino Greenland MD, have reviewed all documentation for this visit. The documentation on 12/16/19 for the exam, diagnosis, procedures, and orders are all accurate and complete.  THE PATIENT IS ENCOURAGED TO PRACTICE SOCIAL DISTANCING DUE TO THE COVID-19 PANDEMIC.

## 2019-12-17 LAB — CMP14+EGFR
ALT: 12 IU/L (ref 0–32)
AST: 19 IU/L (ref 0–40)
Albumin/Globulin Ratio: 1.6 (ref 1.2–2.2)
Albumin: 4.5 g/dL (ref 3.7–4.7)
Alkaline Phosphatase: 108 IU/L (ref 48–121)
BUN/Creatinine Ratio: 24 (ref 12–28)
BUN: 37 mg/dL — ABNORMAL HIGH (ref 8–27)
Bilirubin Total: 0.4 mg/dL (ref 0.0–1.2)
CO2: 21 mmol/L (ref 20–29)
Calcium: 9.9 mg/dL (ref 8.7–10.3)
Chloride: 98 mmol/L (ref 96–106)
Creatinine, Ser: 1.57 mg/dL — ABNORMAL HIGH (ref 0.57–1.00)
GFR calc Af Amer: 37 mL/min/{1.73_m2} — ABNORMAL LOW (ref >59)
GFR calc non Af Amer: 32 mL/min/{1.73_m2} — ABNORMAL LOW (ref >59)
Globulin, Total: 2.9 g/dL (ref 1.5–4.5)
Glucose: 113 mg/dL — ABNORMAL HIGH (ref 65–99)
Potassium: 4.5 mmol/L (ref 3.5–5.2)
Sodium: 137 mmol/L (ref 134–144)
Total Protein: 7.4 g/dL (ref 6.0–8.5)

## 2019-12-17 LAB — CBC
Hematocrit: 41.5 % (ref 34.0–46.6)
Hemoglobin: 13.4 g/dL (ref 11.1–15.9)
MCH: 26.3 pg — ABNORMAL LOW (ref 26.6–33.0)
MCHC: 32.3 g/dL (ref 31.5–35.7)
MCV: 81 fL (ref 79–97)
Platelets: 266 10*3/uL (ref 150–450)
RBC: 5.1 x10E6/uL (ref 3.77–5.28)
RDW: 13.6 % (ref 11.7–15.4)
WBC: 8.6 10*3/uL (ref 3.4–10.8)

## 2019-12-17 LAB — CYCLIC CITRUL PEPTIDE ANTIBODY, IGG/IGA: Cyclic Citrullin Peptide Ab: 6 units (ref 0–19)

## 2019-12-17 LAB — ANTINUCLEAR ANTIBODIES, IFA: ANA Titer 1: NEGATIVE

## 2019-12-17 LAB — HEMOGLOBIN A1C
Est. average glucose Bld gHb Est-mCnc: 140 mg/dL
Hgb A1c MFr Bld: 6.5 % — ABNORMAL HIGH (ref 4.8–5.6)

## 2019-12-17 LAB — TSH: TSH: 1.28 u[IU]/mL (ref 0.450–4.500)

## 2019-12-17 LAB — URIC ACID: Uric Acid: 9.5 mg/dL — ABNORMAL HIGH (ref 3.1–7.9)

## 2019-12-17 LAB — SEDIMENTATION RATE: Sed Rate: 6 mm/hr (ref 0–40)

## 2019-12-17 LAB — RHEUMATOID FACTOR: Rheumatoid fact SerPl-aCnc: <10 IU/mL (ref 0.0–13.9)

## 2019-12-18 ENCOUNTER — Other Ambulatory Visit: Payer: Self-pay | Admitting: Internal Medicine

## 2019-12-28 ENCOUNTER — Other Ambulatory Visit: Payer: Self-pay | Admitting: Internal Medicine

## 2019-12-28 MED ORDER — PREDNISONE 10 MG (21) PO TBPK: ORAL_TABLET | ORAL | 0 refills | Status: DC

## 2019-12-30 ENCOUNTER — Telehealth (INDEPENDENT_AMBULATORY_CARE_PROVIDER_SITE_OTHER): Payer: Medicare Other | Admitting: Cardiovascular Disease

## 2019-12-30 ENCOUNTER — Encounter: Payer: Self-pay | Admitting: Cardiovascular Disease

## 2019-12-30 VITALS — BP 113/65 | HR 64 | Ht 67.0 in | Wt 220.0 lb

## 2019-12-30 DIAGNOSIS — I471 Supraventricular tachycardia: Secondary | ICD-10-CM | POA: Diagnosis not present

## 2019-12-30 DIAGNOSIS — I340 Nonrheumatic mitral (valve) insufficiency: Secondary | ICD-10-CM | POA: Diagnosis not present

## 2019-12-30 DIAGNOSIS — I10 Essential (primary) hypertension: Secondary | ICD-10-CM | POA: Diagnosis not present

## 2019-12-30 DIAGNOSIS — R9430 Abnormal result of cardiovascular function study, unspecified: Secondary | ICD-10-CM | POA: Diagnosis not present

## 2019-12-30 DIAGNOSIS — I480 Paroxysmal atrial fibrillation: Secondary | ICD-10-CM

## 2019-12-30 DIAGNOSIS — I447 Left bundle-branch block, unspecified: Secondary | ICD-10-CM | POA: Diagnosis not present

## 2019-12-30 DIAGNOSIS — I428 Other cardiomyopathies: Secondary | ICD-10-CM

## 2019-12-30 NOTE — Patient Instructions (Addendum)
  Medication Instructions:  Your physician recommends that you continue on your current medications as directed. Please refer to the Current Medication list given to you today.  *If you need a refill on your cardiac medications before your next appointment, please call your pharmacy*  Lab Work: NONE   Testing/Procedures: Your physician has requested that you have an echocardiogram. Echocardiography is a painless test that uses sound waves to create images of your heart. It provides your doctor with information about the size and shape of your heart and how well your heart's [place] and valves are working. This procedure takes approximately one hour. There are no restrictions for this procedure. October 5 at 11:35 [address]   Follow-Up: At [facility], you and your health needs are our priority.  As part of our continuing mission to provide you with exceptional heart care, we have created designated Provider Care Teams.  These Care Teams include your primary Cardiologist (physician) and Advanced Practice Providers (APPs -  Physician Assistants and Nurse Practitioners) who all work together to provide you with the care you need, when you need it.  We recommend signing up for the patient portal called "MyChart".  Sign up information is provided on this After Visit Summary.  MyChart is used to connect with patients for Virtual Visits (Telemedicine).  Patients are able to view lab/test results, encounter notes, upcoming appointments, etc.  Non-urgent messages can be sent to your provider as well.   To learn more about what you can do with MyChart, go to [email or web address].    Your next appointment:   2 month(s)  AFTER ECHO  03/10/2020 at 11:20 am   The format for your next appointment:   In Person  Provider:   You may see [name], MD or one of the following Advanced Practice Providers on your designated Care Team:    Kerin Ransom, PA-C  [place], [place]  Coletta Memos,

## 2019-12-30 NOTE — Progress Notes (Signed)
Virtual Visit via Video Note   This visit type was conducted due to national recommendations for restrictions regarding the COVID-19 Pandemic (e.g. social distancing) in an effort to limit this patient's exposure and mitigate transmission in our community.  Due to her co-morbid illnesses, this patient is at least at moderate risk for complications without adequate follow up.  This format is felt to be most appropriate for this patient at this time.  All issues noted in this document were discussed and addressed.  A limited physical exam was performed with this format.  Please refer to the patient's chart for her consent to telehealth for [facility].   Evaluation Performed:  Follow-up visit  Date:  12/30/2019   ID:  [name], DOB 04-14-1946, MRN [id number]  Patient Location: Home Provider Location: Office  PCP:  [name], MD  Cardiologist:  [name], MD  Electrophysiologist:  None   Chief Complaint:  Heart failure  History of Present Illness:    [name] is a 74 y.o. female with chronic systolic and diastolic heart failure (LVEF 25 to 30%, nonischemic), PAF, diabetes, hyperlipidemia, hypertension, and tobacco abuse here for follow up.  She was previously a patient of Dr. Ron Parker and then Dr. Irish Lack.  She was first diagnosed with heart failure 06/2009.  At that time her LVEF was 25%.  This was associated with atrial fibrillation.  She reports having an ablation with Dr. [name] in 1999, though those records are not available at this time.  She has undergone cardioversions for atrial fibrillation with Dr. Ron Parker.  LVEF has ranged from 25 to 35% but has not improved despite beta-blockers and ACE inhibitor.  She had an echo 11/2011 that revealed LVEF 50 to 55%.  However subsequent studies have ranged from 20-35%.  LVEF on Lexiscan Myoview 09/2014 was 41%.  She developed a left bundle branch block pattern in 2014.  Her cardiomyopathy is nonischemic.  She last saw Dr.  Irish Lack 04/2018 and carvedilol was increased due to concern for atrial tachycardia.  She felt fatigued and shaky at the higher dose.  Her ACE inhibitor was reduced 2/2 CKD III.  Prior to that she called the office 12/2017 with volume overload.  She previously discussed ICD with Dr. Irish Lack but was not interested.  We continued this discussion and she continues to want to think about it.  She was considering CRT-D after coronavirus.  Carvedilol was switched to metoprolol due to hypotension and tachycardia.  Her heart rate remained elevated but her energy levels improved.  Digoxin was started due to poor heart rate control and heart failure.  [name] had a recent episode of syncope.  She was referred for an ambulatory monitor which revealed atrial fibrillation with PVCs.  She saw Dr. [name] on 4/6 who thought she was actually in atrial tachycardia.  He recommended waiting on ICD to see if her LVEF  Improved and felt she was unlikely to benefit from CRT.  After her last appointment [name] underwent successful DCCV for atrial tachycardia on 10/15/19.  Since her last appointment she has been struggling with pain in her right hand.  She is working with Dr. Baird Cancer on that.  From a cardiac standpoint she has been doing well.  Her heart rate has been consistently in the 50s-60s since cardioversion.  She no longer feels like she will pass out.  She denies lower extremity edema, orthopnea or PND.  Her weight has been stable.    The patient does  not have symptoms concerning for COVID-19 infection (fever, chills, cough, or new shortness of breath).    Past Medical History:  Diagnosis Date  . Arthritis    "left knee" (03/12/2013)  . Atrial fibrillation ([place])    Cardioverted to NSR 08/22/2009  . Atrial tachycardia ([place]) 09/30/2019  . Cardiomyopathy   . CHF (congestive heart failure) ([place])   . CKD (chronic kidney disease)   . Depression   . Dizziness   . DM2 (diabetes mellitus, type 2) ([place])    fasting  80-100 (03/12/2013)  . Drug therapy 1/11   coumadin  . Ejection fraction < 50%    20-25%, echo.06/2009, /  EF 30-35%, diffuse hypokinesis, echo, Oct 16, 2010  . GERD (gastroesophageal reflux disease)   . Gout    "left knee" (03/12/2013)  . History of bronchitis   . History of emphysema   . HLD (hyperlipidemia)   . Hyperkalemia   . Hypertension   . LFT elevation    06/2009  . Mitral regurgitation    mild - echo - 1/11 /  Mild, echo, May15, 2012  . Overweight(278.02)   . Pneumonia    "@ least twice" (03/12/2013)  . Thyroid dysfunction    thyromegally,diffuse,nodule LLL,06/2009  . Tobacco abuse   . Tricuspid regurgitation    moderate - echo - 1/11   Past Surgical History:  Procedure Laterality Date  . BREAST BIOPSY Right 1980's  . BREAST LUMPECTOMY Right 1980's  . CARDIOVERSION N/A 10/15/2019   Procedure: CARDIOVERSION;  Surgeon: [name], MD;  Location: [place];  Service: Cardiovascular;  Laterality: N/A;  . CATARACT EXTRACTION W/ INTRAOCULAR LENS  IMPLANT, BILATERAL Bilateral 2010-2012  . CHOLECYSTECTOMY  ~ 1978  . JOINT REPLACEMENT    . TONSILLECTOMY  1960's  . TOTAL KNEE ARTHROPLASTY Left 03/12/2013  . TOTAL KNEE ARTHROPLASTY Left 03/12/2013   Procedure: TOTAL KNEE ARTHROPLASTY;  Surgeon: Newt Minion, MD;  Location: [place];  Service: Orthopedics;  Laterality: Left;  Left total knee arthroplasty  . TUBAL LIGATION    . VAGINAL HYSTERECTOMY  1987     Current Meds  Medication Sig  . acetaminophen (TYLENOL) 500 MG tablet Take 1,000 mg by mouth 3 (three) times daily as needed for pain.  . BD PEN NEEDLE NANO U/F 32G X 4 MM MISC USE AS DIRECTED WITH INSULIN PENS  . betamethasone dipropionate 0.05 % cream Apply 1 application topically 2 (two) times daily as needed (dry skin).  . Cholecalciferol (VITAMIN D) 2000 UNITS CAPS Take 2,000 Units by mouth daily.  . colchicine 0.6 MG tablet Take 0.6 mg by mouth 2 (two) times daily as needed (GOUT).   Marland Kitchen diclofenac Sodium  (VOLTAREN) 1 % GEL Apply 4 g topically 4 (four) times daily.  . digoxin (LANOXIN) 0.125 MG tablet TAKE 1 TABLET BY MOUTH DAILY (Patient taking differently: Take 0.125 mg by mouth daily. )  . ELIQUIS 5 MG TABS tablet TAKE 1 TABLET BY MOUTH TWICE A DAY  . enalapril (VASOTEC) 5 MG tablet Take 1 tablet (5 mg total) by mouth at bedtime.  . furosemide (LASIX) 40 MG tablet TAKE 1 TABLET BY MOUITH DAILY AS NEEDED FOR EDEMA FOR FLUID OVERLOAD  . KLOR-CON M20 20 MEQ tablet TAKE 1 TABLET (20 MEQ TOTAL) BY MOUTH DAILY. WHEN TAKING FUROSEMIDE (LASIX). (Patient taking differently: Take 20 mEq by mouth daily as needed. When taking double  furosemide (Lasix).)  . liraglutide (VICTOZA) 18 MG/3ML SOPN Inject 0.2 mLs (1.2 mg total) into the skin  daily. (Patient taking differently: Inject 0.6 mg into the skin daily. )  . metoprolol succinate (TOPROL-XL) 50 MG 24 hr tablet TAKE 1 TABLET (50 MG TOTAL) BY MOUTH DAILY. TAKE WITH OR IMMEDIATELY FOLLOWING A MEAL.  . Multiple Vitamin (MULITIVITAMIN WITH MINERALS) TABS Take 1 tablet by mouth daily. Centrum Silver  . nystatin cream (MYCOSTATIN) Apply 1 application topically 2 (two) times daily as needed for dry skin (under breasts).  [name] VERIO test strip USE AS DIRECTED TO CHECK BLOOD SUGARS 1 TIME PER DAY DX: E11.22  . pantoprazole (PROTONIX) 20 MG tablet Take 20 mg by mouth daily as needed for heartburn.   . Pitavastatin Calcium (LIVALO) 2 MG TABS Take 2 mg by mouth See admin instructions. Pt take 1 tablet M-W-F   . Polyethyl Glycol-Propyl Glycol (SYSTANE ULTRA) 0.4-0.3 % SOLN Place 1 drop into both eyes daily as needed (dry eyes).  . pravastatin (PRAVACHOL) 40 MG tablet Take 1 tablet by mouth once per week  . predniSONE (STERAPRED UNI-PAK 21 TAB) 10 MG (21) TBPK tablet Please dispense as 6 days dose pack, use as directed  . traMADol (ULTRAM) 50 MG tablet Take 1 tablet (50 mg total) by mouth every 12 (twelve) hours as needed.  . vitamin C (ASCORBIC ACID) 500 MG tablet  Take 500 mg by mouth daily.     Allergies:   Codeine, Augmentin [amoxicillin-pot clavulanate], Coreg [carvedilol], and Warfarin and related   Social History   Tobacco Use  . Smoking status: Current Every Day Smoker    Packs/day: 0.50    Years: 40.00    Pack years: 20.00    Types: Cigarettes  . Smokeless tobacco: Never Used  . Tobacco comment: smoked 1.5ppd x 9, 1ppdx40, cut back to 1/2ppd past 5 years  Vaping Use  . Vaping Use: Never used  Substance Use Topics  . Alcohol use: No  . Drug use: No     Family Hx: The patient's family history includes Colon cancer in an other family member; Heart attack in her son; Heart disease in an other family member; Hypertension in her sister and son; Prostate cancer in her father; Stroke in her maternal grandfather, mother, and paternal grandmother.  ROS:   Please see the history of present illness.     All other systems reviewed and are negative.   Prior CV studies:   The following studies were reviewed today:  Echo 07/28/15: Study Conclusions  - Left ventricle: The cavity size was normal. There was moderate   concentric hypertrophy. Systolic function was severely reduced.   The estimated ejection fraction was in the range of 25% to 30%.   Diffuse hypokinesis. Features are consistent with a pseudonormal   left ventricular filling pattern, with concomitant abnormal   relaxation and increased filling pressure (grade 2 diastolic   dysfunction). Doppler parameters are consistent with high   ventricular filling pressure. - Aortic valve: Transvalvular velocity was within the normal range.   There was no stenosis. There was no regurgitation. - Mitral valve: Calcified annulus. Mildly thickened leaflets .   There was trivial regurgitation. - Left atrium: The atrium was mildly dilated. - Right ventricle: The cavity size was normal. Wall thickness was   normal. Systolic function was normal. - Atrial septum: A patent foramen ovale cannot be  excluded. - Tricuspid valve: There was mild regurgitation. Peak RV-RA   gradient (S): 30 mm Hg. - Inferior vena cava: The vessel was normal in size. The   respirophasic diameter changes were  in the normal range (= 50%),   consistent with normal central venous pressure.  Lexiscan Myoview 09/08/14: LVEF 41%.  No ischemia.   Labs/Other Tests and Data Reviewed:    EKG:  An ECG dated 04/22/19 was personally reviewed today and demonstrated:  Ectopic atrial rhythm.  Rate 95 bpm.  PVCs.  IVCD.  QRS 130 ms.   Recent Labs: 12/16/2019: ALT 12; BUN 37; Creatinine, Ser 1.57; Hemoglobin 13.4; Platelets 266; Potassium 4.5; Sodium 137; TSH 1.280   Recent Lipid Panel Lab Results  Component Value Date/Time   CHOL 134 02/25/2019 04:47 PM   TRIG 142 02/25/2019 04:47 PM   HDL 37 (L) 02/25/2019 04:47 PM   CHOLHDL 3.6 02/25/2019 04:47 PM   CHOLHDL 4.8 06/27/2009 03:28 AM   LDLCALC 72 02/25/2019 04:47 PM    Wt Readings from Last 3 Encounters:  12/30/19 (!) 220 lb (99.8 kg)  12/16/19 225 lb 12.8 oz (102.4 kg)  11/30/19 228 lb (103.4 kg)     Objective:    BP 113/65   Pulse 64   Ht 5\' 7"  (1.702 m)   Wt (!) 220 lb (99.8 kg)   BMI 34.46 kg/m  GENERAL: Sounds well.  No acute distress. RESP: respirations unlabored NEURO:  Speech fluent.   PSYCH:  Cognitively intact, oriented to person place and time   ASSESSMENT & PLAN:    # Syncope:  Improved since she is no longer in atrial tachycardia.   # Atrial tachycardia;  Resolved.  Continue metoprolol and digoxin.  # Acute on chronic systolic and diastolic heart failure: NICM.  She is doing well.  Echo 02/2019 revealed LVEF 30-35%.  She was considered for ICD and there was a plan to wait for repeat echo.  We will get another in Sept/Oct.  Continue digoxin, metoprolol, and enalapril.  Blood pressure has been too low for Entresto or spironolactone.  # Asymptomatic CAD: Noted on chest CT.  Continue pitavastatin. LDL goal <70  # Hyperlipidemia: Ms.  Little is on pitavastatin 2mg  daily.  LDL was 72 pm 02/2019  # Persistent atrial fibrillation: H/o atrial fibrillation s/p remote ablation.  Continue metoprolol and digoxin.  She does not want to stop Eliquis.    # Tobacco abuse: Wants to cut back again.  Not ready to do it yet.   COVID-19 Education: The signs and symptoms of COVID-19 were discussed with the patient and how to seek care for testing (follow up with PCP or arrange E-visit).  The importance of social distancing was discussed today.  Time:   Today, I have spent 25 minutes with the patient with telehealth technology discussing the above problems.     Medication Adjustments/Labs and Tests Ordered: Current medicines are reviewed at length with the patient today.  Concerns regarding medicines are outlined above.   Tests Ordered: Orders Placed This Encounter  Procedures  . ECHOCARDIOGRAM COMPLETE    Medication Changes: No orders of the defined types were placed in this encounter.   Disposition:  Follow up in 3 month(s)  Signed, [name], MD  12/30/2019 10:53 AM    [place]

## 2020-01-04 ENCOUNTER — Telehealth: Payer: Self-pay | Admitting: Internal Medicine

## 2020-01-04 NOTE — Telephone Encounter (Signed)
The pt was told that Dr. Baird Cancer wanted to know how the pt's hand  is doing and the pt said that she is doing and the pt said that her hand pain is at a 5 now, that she can now use her right hand.

## 2020-01-13 ENCOUNTER — Ambulatory Visit: Payer: Self-pay | Admitting: Internal Medicine

## 2020-02-14 ENCOUNTER — Other Ambulatory Visit: Payer: Self-pay | Admitting: Cardiovascular Disease

## 2020-02-14 ENCOUNTER — Other Ambulatory Visit: Payer: Self-pay | Admitting: Internal Medicine

## 2020-03-02 ENCOUNTER — Ambulatory Visit (INDEPENDENT_AMBULATORY_CARE_PROVIDER_SITE_OTHER): Payer: Medicare Other

## 2020-03-02 ENCOUNTER — Encounter: Payer: Self-pay | Admitting: Internal Medicine

## 2020-03-02 ENCOUNTER — Other Ambulatory Visit: Payer: Self-pay

## 2020-03-02 ENCOUNTER — Ambulatory Visit (INDEPENDENT_AMBULATORY_CARE_PROVIDER_SITE_OTHER): Payer: Medicare Other | Admitting: Internal Medicine

## 2020-03-02 VITALS — BP 124/78 | HR 72 | Temp 98.0°F | Ht 67.0 in | Wt 220.0 lb

## 2020-03-02 DIAGNOSIS — M79641 Pain in right hand: Secondary | ICD-10-CM

## 2020-03-02 DIAGNOSIS — E1122 Type 2 diabetes mellitus with diabetic chronic kidney disease: Secondary | ICD-10-CM

## 2020-03-02 DIAGNOSIS — J438 Other emphysema: Secondary | ICD-10-CM

## 2020-03-02 DIAGNOSIS — I130 Hypertensive heart and chronic kidney disease with heart failure and stage 1 through stage 4 chronic kidney disease, or unspecified chronic kidney disease: Secondary | ICD-10-CM | POA: Diagnosis not present

## 2020-03-02 DIAGNOSIS — N2581 Secondary hyperparathyroidism of renal origin: Secondary | ICD-10-CM | POA: Diagnosis not present

## 2020-03-02 DIAGNOSIS — Z794 Long term (current) use of insulin: Secondary | ICD-10-CM

## 2020-03-02 DIAGNOSIS — Z720 Tobacco use: Secondary | ICD-10-CM

## 2020-03-02 DIAGNOSIS — Z0000 Encounter for general adult medical examination without abnormal findings: Secondary | ICD-10-CM

## 2020-03-02 DIAGNOSIS — E66811 Other obesity due to excess calories: Secondary | ICD-10-CM

## 2020-03-02 DIAGNOSIS — N183 Chronic kidney disease, stage 3 unspecified: Secondary | ICD-10-CM | POA: Diagnosis not present

## 2020-03-02 DIAGNOSIS — E6609 Other obesity due to excess calories: Secondary | ICD-10-CM

## 2020-03-02 DIAGNOSIS — Z6834 Body mass index (BMI) 34.0-34.9, adult: Secondary | ICD-10-CM

## 2020-03-02 DIAGNOSIS — E559 Vitamin D deficiency, unspecified: Secondary | ICD-10-CM | POA: Diagnosis not present

## 2020-03-02 LAB — POCT URINALYSIS DIPSTICK
Bilirubin, UA: NEGATIVE
Blood, UA: NEGATIVE
Glucose, UA: NEGATIVE
Ketones, UA: NEGATIVE
Leukocytes, UA: NEGATIVE
Nitrite, UA: NEGATIVE
Protein, UA: NEGATIVE
Spec Grav, UA: 1.02 (ref 1.010–1.025)
Urobilinogen, UA: 0.2 E.U./dL
pH, UA: 5.5 (ref 5.0–8.0)

## 2020-03-02 LAB — POCT UA - MICROALBUMIN
Albumin/Creatinine Ratio, Urine, POC: 30
Creatinine, POC: 200 mg/dL
Microalbumin Ur, POC: 30 mg/L

## 2020-03-02 NOTE — Progress Notes (Signed)
I,[name],acting as a Education administrator for Shelby Greenland, MD.,have documented all relevant documentation on the behalf of Shelby Greenland, MD,as directed by  Shelby Greenland, MD while in the presence of Shelby Greenland, MD.  This visit occurred during the SARS-CoV-2 public health emergency.  Safety protocols were in place, including screening questions prior to the visit, additional usage of staff PPE, and extensive cleaning of exam room while observing appropriate contact time as indicated for disinfecting solutions.  Subjective:     Patient ID: [name] , female    DOB: 1945/12/12 , 74 y.o.   MRN: [id number]   Chief Complaint  Patient presents with  . Annual Exam  . Diabetes  . Hypertension    HPI  She is here today for a full physical examination. She is no longer followed by GYN for her pelvic exams. She has no specific concerns or complaints at this time.   Diabetes She presents for her follow-up diabetic visit. She has type 2 diabetes mellitus. Her disease course has been stable. There are no hypoglycemic associated symptoms. Pertinent negatives for diabetes include no blurred vision. There are no hypoglycemic complications. Diabetic complications include nephropathy. Risk factors for coronary artery disease include diabetes mellitus, dyslipidemia, hypertension, obesity, post-menopausal and sedentary lifestyle. Meal planning includes avoidance of concentrated sweets. She participates in exercise intermittently. An ACE inhibitor/angiotensin II receptor blocker is being taken. Eye exam is current.  Hypertension This is a chronic problem. The current episode started more than 1 year ago. The problem has been gradually improving since onset. The problem is controlled. Pertinent negatives include no blurred vision. Risk factors for coronary artery disease include diabetes mellitus, dyslipidemia, obesity, post-menopausal state and sedentary lifestyle. The current treatment provides  moderate improvement. Compliance problems include exercise.      Past Medical History:  Diagnosis Date  . Arthritis    "left knee" (03/12/2013)  . Atrial fibrillation ([place])    Cardioverted to NSR 08/22/2009  . Atrial tachycardia ([place]) 09/30/2019  . Cardiomyopathy   . CHF (congestive heart failure) ([place])   . CKD (chronic kidney disease)   . Depression   . Dizziness   . DM2 (diabetes mellitus, type 2) ([place])    fasting 80-100 (03/12/2013)  . Drug therapy 1/11   coumadin  . Ejection fraction < 50%    20-25%, echo.06/2009, /  EF 30-35%, diffuse hypokinesis, echo, Oct 16, 2010  . GERD (gastroesophageal reflux disease)   . Gout    "left knee" (03/12/2013)  . History of bronchitis   . History of emphysema   . HLD (hyperlipidemia)   . Hyperkalemia   . Hypertension   . LFT elevation    06/2009  . Mitral regurgitation    mild - echo - 1/11 /  Mild, echo, May15, 2012  . Overweight(278.02)   . Pneumonia    "@ least twice" (03/12/2013)  . Thyroid dysfunction    thyromegally,diffuse,nodule LLL,06/2009  . Tobacco abuse   . Tricuspid regurgitation    moderate - echo - 1/11     Family History  Problem Relation Age of Onset  . Stroke Mother   . Prostate cancer Father   . Colon cancer Other   . Heart disease Other   . Heart attack Son   . Hypertension Sister   . Hypertension Son        [id number]  . Stroke Maternal Grandfather   . Stroke Paternal Grandmother      Current Outpatient Medications:  .  acetaminophen (TYLENOL) 500 MG tablet, Take 1,000 mg by mouth 3 (three) times daily as needed for pain., Disp: , Rfl:  .  BD PEN NEEDLE NANO U/F 32G X 4 MM MISC, USE AS DIRECTED WITH INSULIN PENS, Disp: 100 each, Rfl: 11 .  betamethasone dipropionate 0.05 % cream, Apply 1 application topically 2 (two) times daily as needed (dry skin)., Disp: , Rfl:  .  Cholecalciferol (VITAMIN D) 2000 UNITS CAPS, Take 2,000 Units by mouth daily., Disp: , Rfl:  .  colchicine 0.6 MG tablet, Take 0.6 mg by mouth  2 (two) times daily as needed (GOUT). , Disp: , Rfl:  .  diclofenac Sodium (VOLTAREN) 1 % GEL, Apply 4 g topically 4 (four) times daily., Disp: 100 g, Rfl: 0 .  digoxin (LANOXIN) 0.125 MG tablet, TAKE 1 TABLET BY MOUTH DAILY (Patient taking differently: Take 0.125 mg by mouth daily. ), Disp: 90 tablet, Rfl: 3 .  ELIQUIS 5 MG TABS tablet, TAKE 1 TABLET BY MOUTH TWICE A DAY, Disp: 180 tablet, Rfl: 1 .  enalapril (VASOTEC) 5 MG tablet, TAKE 1 TABLET BY MOUTH EVERYDAY AT BEDTIME, Disp: 90 tablet, Rfl: 3 .  furosemide (LASIX) 40 MG tablet, TAKE 1 TABLET BY MOUITH DAILY AS NEEDED FOR EDEMA FOR FLUID OVERLOAD, Disp: 90 tablet, Rfl: 2 .  KLOR-CON M20 20 MEQ tablet, TAKE 1 TABLET (20 MEQ TOTAL) BY MOUTH DAILY. WHEN TAKING FUROSEMIDE (LASIX). (Patient taking differently: Take 20 mEq by mouth daily as needed. When taking double  furosemide (Lasix).), Disp: 90 tablet, Rfl: 2 .  liraglutide (VICTOZA) 18 MG/3ML SOPN, Inject 0.2 mLs (1.2 mg total) into the skin daily. (Patient taking differently: Inject 0.6 mg into the skin daily. ), Disp: 6 pen, Rfl: 2 .  metoprolol succinate (TOPROL-XL) 50 MG 24 hr tablet, TAKE 1 TABLET (50 MG TOTAL) BY MOUTH DAILY. TAKE WITH OR IMMEDIATELY FOLLOWING A MEAL., Disp: 90 tablet, Rfl: 3 .  Multiple Vitamin (MULITIVITAMIN WITH MINERALS) TABS, Take 1 tablet by mouth daily. Centrum Silver, Disp: , Rfl:  .  nystatin cream (MYCOSTATIN), Apply 1 application topically 2 (two) times daily as needed for dry skin (under breasts)., Disp: , Rfl:  .  ONETOUCH VERIO test strip, USE AS DIRECTED TO CHECK BLOOD SUGARS 1 TIME PER DAY DX: E11.22, Disp: 50 strip, Rfl: 7 .  pantoprazole (PROTONIX) 20 MG tablet, Take 20 mg by mouth daily as needed for heartburn. , Disp: , Rfl:  .  Pitavastatin Calcium (LIVALO) 2 MG TABS, Take 2 mg by mouth See admin instructions. Pt take 1 tablet M-W-F , Disp: , Rfl:  .  Polyethyl Glycol-Propyl Glycol (SYSTANE ULTRA) 0.4-0.3 % SOLN, Place 1 drop into both eyes daily as  needed (dry eyes)., Disp: , Rfl:  .  pravastatin (PRAVACHOL) 40 MG tablet, Take 1 tablet by mouth once per week, Disp: 15 tablet, Rfl: 11 .  traMADol (ULTRAM) 50 MG tablet, Take 1 tablet (50 mg total) by mouth every 12 (twelve) hours as needed., Disp: 30 tablet, Rfl: 0 .  vitamin C (ASCORBIC ACID) 500 MG tablet, Take 500 mg by mouth daily., Disp: , Rfl:    Allergies  Allergen Reactions  . Codeine Hives and Nausea And Vomiting  . Augmentin [Amoxicillin-Pot Clavulanate] Diarrhea  . Coreg [Carvedilol]     No energy  . Warfarin And Related     Severe rectal bleeding      The patient states she uses post menopausal status for birth control. Last LMP was No LMP  recorded. Patient has had a hysterectomy.. Negative for Dysmenorrhea. Negative for: breast discharge, breast lump(s), breast pain and breast self exam. Associated symptoms include abnormal vaginal bleeding. Pertinent negatives include abnormal bleeding (hematology), anxiety, decreased libido, depression, difficulty falling sleep, dyspareunia, history of infertility, nocturia, sexual dysfunction, sleep disturbances, urinary incontinence, urinary urgency, vaginal discharge and vaginal itching. Diet regular.The patient states her exercise level is  intermittent.  . The patient's tobacco use is:  Social History   Tobacco Use  Smoking Status Current Every Day Smoker  . Packs/day: 0.50  . Years: 40.00  . Pack years: 20.00  . Types: Cigarettes  Smokeless Tobacco Never Used  Tobacco Comment   smoked 1.5ppd x 9, 1ppdx40, cut back to 1/2ppd past 5 years  . She has been exposed to passive smoke. The patient's alcohol use is:  Social History   Substance and Sexual Activity  Alcohol Use No    Review of Systems  Constitutional: Negative.   HENT: Negative.   Eyes: Negative.  Negative for blurred vision.  Respiratory: Negative.   Cardiovascular: Negative.   Gastrointestinal: Negative.   Endocrine: Negative.   Genitourinary: Negative.    Musculoskeletal: Positive for arthralgias.       She is still having right hand/wrist pain; although, she states her sx have greatly improved. She still has difficulty with opening certain things.   Skin: Negative.   Allergic/Immunologic: Negative.   Neurological: Negative.   Hematological: Negative.   Psychiatric/Behavioral: Negative.      Today's Vitals   03/02/20 1429  BP: 124/78  Pulse: 72  Temp: 98 F (36.7 C)  TempSrc: Oral  Weight: 220 lb (99.8 kg)  Height: '5\' 7"'  (1.702 m)  PainSc: 1   PainLoc: Wrist   Body mass index is 34.46 kg/m.  Wt Readings from Last 3 Encounters:  03/07/20 226 lb (102.5 kg)  03/02/20 220 lb (99.8 kg)  03/02/20 220 lb (99.8 kg)   Objective:  Physical Exam Constitutional:      General: She is not in acute distress.    Appearance: Normal appearance. She is well-developed. She is obese.  HENT:     Head: Normocephalic and atraumatic.     Right Ear: Hearing, tympanic membrane, ear canal and external ear normal. There is no impacted cerumen.     Left Ear: Hearing, tympanic membrane, ear canal and external ear normal. There is no impacted cerumen.     Nose:     Comments: Deferred, masked    Mouth/Throat:     Comments: Deferred, masked Eyes:     General: Lids are normal.     Extraocular Movements: Extraocular movements intact.     Conjunctiva/sclera: Conjunctivae normal.     Pupils: Pupils are equal, round, and reactive to light.     Funduscopic exam:    Right eye: No papilledema.        Left eye: No papilledema.  Neck:     Thyroid: No thyroid mass.     Vascular: No carotid bruit.  Cardiovascular:     Rate and Rhythm: Normal rate and regular rhythm.     Pulses:          Dorsalis pedis pulses are 1+ on the right side and 1+ on the left side.     Heart sounds: Normal heart sounds. No murmur heard.   Pulmonary:     Effort: Pulmonary effort is normal.     Comments: Decreased breath sounds at bases Chest:     Breasts: Tanner Score is  5.         Right: Normal.        Left: Normal.     Comments: Healed skin lesions underneath b/l breasts. Resolving intertrigo as well Abdominal:     General: Bowel sounds are normal. There is no distension.     Palpations: Abdomen is soft.     Tenderness: There is no abdominal tenderness.     Comments: Rounded, soft  Musculoskeletal:        General: No swelling. Normal range of motion.     Cervical back: Full passive range of motion without pain, normal range of motion and neck supple.     Right lower leg: No edema.     Left lower leg: No edema.  Feet:     Right foot:     Protective Sensation: 5 sites tested. 5 sites sensed.     Skin integrity: Callus and dry skin present.     Toenail Condition: Right toenails are abnormally thick and long.     Left foot:     Protective Sensation: 5 sites tested. 5 sites sensed.     Skin integrity: Callus and dry skin present.     Toenail Condition: Left toenails are abnormally thick and long.  Skin:    General: Skin is warm and dry.     Capillary Refill: Capillary refill takes less than 2 seconds.  Neurological:     General: No focal deficit present.     Mental Status: She is alert and oriented to person, place, and time.     Cranial Nerves: No cranial nerve deficit.     Sensory: No sensory deficit.  Psychiatric:        Mood and Affect: Mood normal.        Behavior: Behavior normal.        Thought Content: Thought content normal.        Judgment: Judgment normal.         Assessment And Plan:     1. Routine general medical examination at health care facility Comments: A full exam was performed. Importance of monthly self breast exams was discussed with the patient.PATIENT IS ADVISED TO GET 30-45 MINUTES REGULAR EXERCISE NO LESS THAN FOUR TO FIVE DAYS PER WEEK - BOTH WEIGHTBEARING EXERCISES AND AEROBIC ARE RECOMMENDED.  PATIENT IS ADVISED TO FOLLOW A HEALTHY DIET WITH AT LEAST SIX FRUITS/VEGGIES PER DAY, DECREASE INTAKE OF RED MEAT, AND TO  INCREASE FISH INTAKE TO TWO DAYS PER WEEK.  MEATS/FISH SHOULD NOT BE FRIED, BAKED OR BROILED IS PREFERABLE.  I SUGGEST WEARING SPF 50 SUNSCREEN ON EXPOSED PARTS AND ESPECIALLY WHEN IN THE DIRECT SUNLIGHT FOR AN EXTENDED PERIOD OF TIME.  PLEASE AVOID FAST FOOD RESTAURANTS AND INCREASE YOUR WATER INTAKE.    2. Type 2 diabetes mellitus with stage 3 chronic kidney disease, with long-term current use of insulin, unspecified whether stage 3a or 3b CKD ([place]) Comments: Diabetic foot exam was performed. I DISCUSSED WITH THE PATIENT AT LENGTH REGARDING THE GOALS OF GLYCEMIC CONTROL AND POSSIBLE LONG-TERM COMPLICATIONS.  I  ALSO STRESSED THE IMPORTANCE OF COMPLIANCE WITH HOME GLUCOSE MONITORING, DIETARY RESTRICTIONS INCLUDING AVOIDANCE OF SUGARY DRINKS/PROCESSED FOODS,  ALONG WITH REGULAR EXERCISE.  I  ALSO STRESSED THE IMPORTANCE OF ANNUAL EYE EXAMS, SELF FOOT CARE AND COMPLIANCE WITH OFFICE VISITS.   - Lipid panel - BMP8+EGFR - Protein electrophoresis, serum - Phosphorus - Parathyroid Hormone, Intact w/Ca - POCT Urinalysis Dipstick (25956) - POCT UA - Microalbumin  3. Hypertensive heart  and renal disease with renal failure, stage 1 through stage 4 or unspecified chronic kidney disease, with heart failure (HCC) Comments: Chronic, well controlled. EKG performed, NSR w/ nonspecific QRS widening, old anterior infarct and nonspeific T abnormality - no new changes. Encouraged to follow low sodium diet.  - EKG 12-Lead  4. Secondary hyperparathyroidism of renal origin [facility]) Comments: Chronic. I will continue to follow PTH levels.   5. Other emphysema ([place]) Comments: Chronic, yet stable. I do think she would benefit from Pulmonary evaluation, given her tobacco use. She agrees to low dose CT, if it can be done in Nov.   6. Hand pain, right Comments: I will refer her to Hand specialist for further evaluation. NCV positive for mild CTS in 2017. - Ambulatory referral to Hand Surgery  7. Vitamin D deficiency  disease Comments: I will check vitamin D level and supplement as needed.  - VITAMIN D 25 Hydroxy (Vit-D Deficiency, Fractures)  8. Tobacco abuse Comments: Encouraged to cut back on number of cigs smoked per day. Does not wish to quit at this time. I will check low dose chest CT for lung CA screen.  - CT CHEST LUNG CA SCREEN LOW DOSE W/O CM; Future  9. Class 1 obesity due to excess calories with serious comorbidity and body mass index (BMI) of 34.0 to 34.9 in adult Comments: She is encouraged to strive for BMI less than 30 to decrease cardiac risk.     Patient was given opportunity to ask questions. Patient verbalized understanding of the plan and was able to repeat key elements of the plan. All questions were answered to their satisfaction.   Shelby Greenland, MD   I, Shelby Greenland, MD, have reviewed all documentation for this visit. The documentation on 03/11/20 for the exam, diagnosis, procedures, and orders are all accurate and complete.  THE PATIENT IS ENCOURAGED TO PRACTICE SOCIAL DISTANCING DUE TO THE COVID-19 PANDEMIC.

## 2020-03-02 NOTE — Patient Instructions (Signed)
Health Maintenance After Age 75 After age 75, you are at a higher risk for certain long-term diseases and infections as well as injuries from falls. Falls are a major cause of broken bones and head injuries in people who are older than age 75. Getting regular preventive care can help to keep you healthy and well. Preventive care includes getting regular testing and making lifestyle changes as recommended by your health care provider. Talk with your health care provider about:  Which screenings and tests you should have. A screening is a test that checks for a disease when you have no symptoms.  A diet and exercise plan that is right for you. What should I know about screenings and tests to prevent falls? Screening and testing are the best ways to find a health problem early. Early diagnosis and treatment give you the best chance of managing medical conditions that are common after age 75. Certain conditions and lifestyle choices may make you more likely to have a fall. Your health care provider may recommend:  Regular vision checks. Poor vision and conditions such as cataracts can make you more likely to have a fall. If you wear glasses, make sure to get your prescription updated if your vision changes.  Medicine review. Work with your health care provider to regularly review all of the medicines you are taking, including over-the-counter medicines. Ask your health care provider about any side effects that may make you more likely to have a fall. Tell your health care provider if any medicines that you take make you feel dizzy or sleepy.  Osteoporosis screening. Osteoporosis is a condition that causes the bones to get weaker. This can make the bones weak and cause them to break more easily.  Blood pressure screening. Blood pressure changes and medicines to control blood pressure can make you feel dizzy.  Strength and balance checks. Your health care provider may recommend certain tests to check your  strength and balance while standing, walking, or changing positions.  Foot health exam. Foot pain and numbness, as well as not wearing proper footwear, can make you more likely to have a fall.  Depression screening. You may be more likely to have a fall if you have a fear of falling, feel emotionally low, or feel unable to do activities that you used to do.  Alcohol use screening. Using too much alcohol can affect your balance and may make you more likely to have a fall. What actions can I take to lower my risk of falls? General instructions  Talk with your health care provider about your risks for falling. Tell your health care provider if: ? You fall. Be sure to tell your health care provider about all falls, even ones that seem minor. ? You feel dizzy, sleepy, or off-balance.  Take over-the-counter and prescription medicines only as told by your health care provider. These include any supplements.  Eat a healthy diet and maintain a healthy weight. A healthy diet includes low-fat dairy products, low-fat (lean) meats, and fiber from whole grains, beans, and lots of fruits and vegetables. Home safety  Remove any tripping hazards, such as rugs, cords, and clutter.  Install safety equipment such as grab bars in bathrooms and safety rails on stairs.  Keep rooms and walkways well-lit. Activity   Follow a regular exercise program to stay fit. This will help you maintain your balance. Ask your health care provider what types of exercise are appropriate for you.  If you need a cane or   walker, use it as recommended by your health care provider.  Wear supportive shoes that have nonskid soles. Lifestyle  Do not drink alcohol if your health care provider tells you not to drink.  If you drink alcohol, limit how much you have: ? 0-1 drink a day for women. ? 0-2 drinks a day for men.  Be aware of how much alcohol is in your drink. In the [place] one drink equals one typical bottle of beer (12  oz), one-half glass of wine (5 oz), or one shot of hard liquor (1 oz).  Do not use any products that contain nicotine or tobacco, such as cigarettes and e-cigarettes. If you need help quitting, ask your health care provider. Summary  Having a healthy lifestyle and getting preventive care can help to protect your health and wellness after age 75.  Screening and testing are the best way to find a health problem early and help you avoid having a fall. Early diagnosis and treatment give you the best chance for managing medical conditions that are more common for people who are older than age 75.  Falls are a major cause of broken bones and head injuries in people who are older than age 75. Take precautions to prevent a fall at home.  Work with your health care provider to learn what changes you can make to improve your health and wellness and to prevent falls. This information is not intended to replace advice given to you by your health care provider. Make sure you discuss any questions you have with your health care provider. Document Revised: 09/10/2018 Document Reviewed: 04/02/2017 [employer] Patient Education  2020 [employer]  

## 2020-03-02 NOTE — Progress Notes (Signed)
This visit occurred during the SARS-CoV-2 public health emergency.  Safety protocols were in place, including screening questions prior to the visit, additional usage of staff PPE, and extensive cleaning of exam room while observing appropriate contact time as indicated for disinfecting solutions.  Subjective:   [name] is a 74 y.o. female who presents for Medicare Annual (Subsequent) preventive examination.  Review of Systems     Cardiac Risk Factors include: advanced age (>13men, >67 women);diabetes mellitus;hypertension;obesity (BMI >30kg/m2);sedentary lifestyle     Objective:    Today's Vitals   03/02/20 1535  BP: 124/78  Pulse: 72  Temp: 98 F (36.7 C)  TempSrc: Oral  Weight: 220 lb (99.8 kg)  Height: 5\' 7"  (1.702 m)   Body mass index is 34.46 kg/m.  Advanced Directives 03/02/2020 11/30/2019 10/15/2019 02/25/2019 05/16/2013 03/12/2013 03/10/2013  Does Patient Have a Medical Advance Directive? Yes Yes Yes Yes Patient has advance directive, copy not in chart Patient has advance directive, copy not in chart Patient has advance directive, copy not in chart  Type of Advance Directive [place];Living will [place];Living will [place];Living will Living will Healthcare Power of Attorney Living will [place];Living will  Does patient want to make changes to medical advance directive? - - - - No - -  Copy of [place] in Chart? No - copy requested - No - copy requested - Copy requested from family - -  Pre-existing out of facility DNR order (yellow form or pink MOST form) - - - - No No -    Current Medications (verified) Outpatient Encounter Medications as of 03/02/2020  Medication Sig  . acetaminophen (TYLENOL) 500 MG tablet Take 1,000 mg by mouth 3 (three) times daily as needed for pain.  . BD PEN NEEDLE NANO U/F 32G X 4 MM MISC USE AS DIRECTED WITH INSULIN PENS  . betamethasone  dipropionate 0.05 % cream Apply 1 application topically 2 (two) times daily as needed (dry skin).  . Cholecalciferol (VITAMIN D) 2000 UNITS CAPS Take 2,000 Units by mouth daily.  . colchicine 0.6 MG tablet Take 0.6 mg by mouth 2 (two) times daily as needed (GOUT).   Marland Kitchen diclofenac Sodium (VOLTAREN) 1 % GEL Apply 4 g topically 4 (four) times daily.  . digoxin (LANOXIN) 0.125 MG tablet TAKE 1 TABLET BY MOUTH DAILY (Patient taking differently: Take 0.125 mg by mouth daily. )  . ELIQUIS 5 MG TABS tablet TAKE 1 TABLET BY MOUTH TWICE A DAY  . enalapril (VASOTEC) 5 MG tablet TAKE 1 TABLET BY MOUTH EVERYDAY AT BEDTIME  . furosemide (LASIX) 40 MG tablet TAKE 1 TABLET BY MOUITH DAILY AS NEEDED FOR EDEMA FOR FLUID OVERLOAD  . KLOR-CON M20 20 MEQ tablet TAKE 1 TABLET (20 MEQ TOTAL) BY MOUTH DAILY. WHEN TAKING FUROSEMIDE (LASIX). (Patient taking differently: Take 20 mEq by mouth daily as needed. When taking double  furosemide (Lasix).)  . liraglutide (VICTOZA) 18 MG/3ML SOPN Inject 0.2 mLs (1.2 mg total) into the skin daily. (Patient taking differently: Inject 0.6 mg into the skin daily. )  . metoprolol succinate (TOPROL-XL) 50 MG 24 hr tablet TAKE 1 TABLET (50 MG TOTAL) BY MOUTH DAILY. TAKE WITH OR IMMEDIATELY FOLLOWING A MEAL.  . Multiple Vitamin (MULITIVITAMIN WITH MINERALS) TABS Take 1 tablet by mouth daily. Centrum Silver  . nystatin cream (MYCOSTATIN) Apply 1 application topically 2 (two) times daily as needed for dry skin (under breasts).  [name]  VERIO test strip USE AS DIRECTED TO CHECK BLOOD SUGARS 1 TIME PER DAY DX: E11.22  . pantoprazole (PROTONIX) 20 MG tablet Take 20 mg by mouth daily as needed for heartburn.   . Pitavastatin Calcium (LIVALO) 2 MG TABS Take 2 mg by mouth See admin instructions. Pt take 1 tablet M-W-F   . Polyethyl Glycol-Propyl Glycol (SYSTANE ULTRA) 0.4-0.3 % SOLN Place 1 drop into both eyes daily as needed (dry eyes).  . pravastatin (PRAVACHOL) 40 MG tablet Take 1 tablet by  mouth once per week  . traMADol (ULTRAM) 50 MG tablet Take 1 tablet (50 mg total) by mouth every 12 (twelve) hours as needed.  . vitamin C (ASCORBIC ACID) 500 MG tablet Take 500 mg by mouth daily.   No facility-administered encounter medications on file as of 03/02/2020.    Allergies (verified) Codeine, Augmentin [amoxicillin-pot clavulanate], Coreg [carvedilol], and Warfarin and related   History: Past Medical History:  Diagnosis Date  . Arthritis    "left knee" (03/12/2013)  . Atrial fibrillation ([place])    Cardioverted to NSR 08/22/2009  . Atrial tachycardia ([place]) 09/30/2019  . Cardiomyopathy   . CHF (congestive heart failure) ([place])   . CKD (chronic kidney disease)   . Depression   . Dizziness   . DM2 (diabetes mellitus, type 2) ([place])    fasting 80-100 (03/12/2013)  . Drug therapy 1/11   coumadin  . Ejection fraction < 50%    20-25%, echo.06/2009, /  EF 30-35%, diffuse hypokinesis, echo, Oct 16, 2010  . GERD (gastroesophageal reflux disease)   . Gout    "left knee" (03/12/2013)  . History of bronchitis   . History of emphysema   . HLD (hyperlipidemia)   . Hyperkalemia   . Hypertension   . LFT elevation    06/2009  . Mitral regurgitation    mild - echo - 1/11 /  Mild, echo, May15, 2012  . Overweight(278.02)   . Pneumonia    "@ least twice" (03/12/2013)  . Thyroid dysfunction    thyromegally,diffuse,nodule LLL,06/2009  . Tobacco abuse   . Tricuspid regurgitation    moderate - echo - 1/11   Past Surgical History:  Procedure Laterality Date  . BREAST BIOPSY Right 1980's  . BREAST LUMPECTOMY Right 1980's  . CARDIOVERSION N/A 10/15/2019   Procedure: CARDIOVERSION;  Surgeon: [name], MD;  Location: [facility];  Service: Cardiovascular;  Laterality: N/A;  . CATARACT EXTRACTION W/ INTRAOCULAR LENS  IMPLANT, BILATERAL Bilateral 2010-2012  . CHOLECYSTECTOMY  ~ 1978  . JOINT REPLACEMENT    . TONSILLECTOMY  1960's  . TOTAL KNEE ARTHROPLASTY Left 03/12/2013  .  TOTAL KNEE ARTHROPLASTY Left 03/12/2013   Procedure: TOTAL KNEE ARTHROPLASTY;  Surgeon: Newt Minion, MD;  Location: [facility];  Service: Orthopedics;  Laterality: Left;  Left total knee arthroplasty  . TUBAL LIGATION    . VAGINAL HYSTERECTOMY  1987   Family History  Problem Relation Age of Onset  . Stroke Mother   . Prostate cancer Father   . Colon cancer Other   . Heart disease Other   . Heart attack Son   . Hypertension Sister   . Hypertension Son        X55  . Stroke Maternal Grandfather   . Stroke Paternal Grandmother    Social History   Socioeconomic History  . Marital status: Divorced    Spouse name: Not on file  . Number of children: Not on file  . Years of education: Not on  file  . Highest education level: Not on file  Occupational History  . Occupation: retired  Tobacco Use  . Smoking status: Current Every Day Smoker    Packs/day: 0.50    Years: 40.00    Pack years: 20.00    Types: Cigarettes  . Smokeless tobacco: Never Used  . Tobacco comment: smoked 1.5ppd x 9, 1ppdx40, cut back to 1/2ppd past 5 years  Vaping Use  . Vaping Use: Never used  Substance and Sexual Activity  . Alcohol use: No  . Drug use: No  . Sexual activity: Not Currently  Other Topics Concern  . Not on file  Social History Narrative  . Not on file   Social Determinants of Health   Financial Resource Strain: Medium Risk  . Difficulty of Paying Living Expenses: Somewhat hard  Food Insecurity: No Food Insecurity  . Worried About Charity fundraiser in the Last Year: Never true  . Ran Out of Food in the Last Year: Never true  Transportation Needs: No Transportation Needs  . Lack of Transportation (Medical): No  . Lack of Transportation (Non-Medical): No  Physical Activity: Inactive  . Days of Exercise per Week: 0 days  . Minutes of Exercise per Session: 0 min  Stress: No Stress Concern Present  . Feeling of Stress : Not at all  Social Connections:   . Frequency of Communication with  Friends and Family: Not on file  . Frequency of Social Gatherings with Friends and Family: Not on file  . Attends Religious Services: Not on file  . Active Member of Clubs or Organizations: Not on file  . Attends Archivist Meetings: Not on file  . Marital Status: Not on file    Tobacco Counseling Ready to quit: Not Answered Counseling given: Not Answered Comment: smoked 1.5ppd x 9, 1ppdx40, cut back to 1/2ppd past 5 years   Clinical Intake:  Pre-visit preparation completed: Yes  Pain : No/denies pain     Nutritional Status: BMI > 30  Obese Nutritional Risks: None Diabetes: Yes  How often do you need to have someone help you when you read instructions, pamphlets, or other written materials from your doctor or pharmacy?: 1 - Never What is the last grade level you completed in school?: 57yrs college  Diabetic? Yes Nutrition Risk Assessment:  Has the patient had any N/V/D within the last 2 months?  No  Does the patient have any non-healing wounds?  No  Has the patient had any unintentional weight loss or weight gain?  No   Diabetes:  Is the patient diabetic?  Yes  If diabetic, was a CBG obtained today?  No  Did the patient bring in their glucometer from home?  No  How often do you monitor your CBG's? daily.   Financial Strains and Diabetes Management:  Are you having any financial strains with the device, your supplies or your medication? No .  Does the patient want to be seen by Chronic Care Management for management of their diabetes?  No  Would the patient like to be referred to a Nutritionist or for Diabetic Management?  No   Diabetic Exams:  Diabetic Eye Exam: Overdue for diabetic eye exam. Pt has been advised about the importance in completing this exam. Patient advised to call and schedule an eye exam. Diabetic Foot Exam: Completed today   Interpreter Needed?: No  Information entered by :: NAllen LPN   Activities of Daily Living In your  present state of health,  do you have any difficulty performing the following activities: 03/02/2020 03/02/2020  Hearing? N N  Vision? N N  Difficulty concentrating or making decisions? N N  Walking or climbing stairs? Tempie Donning  Comment takes some time -  Dressing or bathing? N Y  Doing errands, shopping? N Y  Conservation officer, nature and eating ? N -  Using the Toilet? N -  In the past six months, have you accidently leaked urine? Y -  Do you have problems with loss of bowel control? N -  Managing your Medications? N -  Managing your Finances? N -  Housekeeping or managing your Housekeeping? N -  Some recent data might be hidden    Patient Care Team: [name], MD as PCP - General (Internal Medicine) [name], MD as PCP - Cardiology (Cardiology) Carol Ada, MD as Consulting Physician (Gastroenterology)  Indicate any recent Medical Services you may have received from other than [facility] providers in the past year (date may be approximate).     Assessment:   This is a routine wellness examination for [facility].  Hearing/Vision screen  Hearing Screening   125Hz  250Hz  500Hz  1000Hz  2000Hz  3000Hz  4000Hz  6000Hz  8000Hz   Right ear:           Left ear:           Vision Screening Comments: Regular eye exams, Dr. Venetia Maxon   Dietary issues and exercise activities discussed: Current Exercise Habits: The patient does not participate in regular exercise at present  Goals    . Patient Stated     02/25/2019, to stay alive    . Patient Stated     03/02/2020, no goals      Depression Screen PHQ 2/9 Scores 03/02/2020 02/25/2019 02/04/2019 11/30/2018 08/03/2018 04/29/2018  PHQ - 2 Score 0 0 0 0 0 0  PHQ- 9 Score - 0 - - - -    Fall Risk Fall Risk  03/02/2020 03/02/2020 02/25/2019 02/25/2019 02/04/2019  Falls in the past year? 1 1 0 0 0  Comment blacked out - - - -  Number falls in past yr: 0 0 - - -  Injury with Fall? 0 1 - - -  Comment - hurt right hand and arm - - -  Risk for fall due to :  Medication side effect - Medication side effect - -  Follow up Falls evaluation completed;Education provided;Falls prevention discussed - Falls evaluation completed;Education provided;Falls prevention discussed - -    Any stairs in or around the home? Yes  If so, are there any without handrails? No  Home free of loose throw rugs in walkways, pet beds, electrical cords, etc? Yes  Adequate lighting in your home to reduce risk of falls? Yes   ASSISTIVE DEVICES UTILIZED TO PREVENT FALLS:  Life alert? No  Use of a cane, walker or w/c? No  Grab bars in the bathroom? Yes  Shower chair or bench in shower? Yes  Elevated toilet seat or a handicapped toilet? Yes   TIMED UP AND GO:  Was the test performed? No .   Gait slow and steady without use of assistive device  Cognitive Function:     6CIT Screen 03/02/2020 02/25/2019  What Year? 0 points 0 points  What month? 0 points 0 points  What time? 0 points 0 points  Count back from 20 2 points 0 points  Months in reverse 0 points 0 points  Repeat phrase 2 points 0 points  Total Score 4 0  Immunizations Immunization History  Administered Date(s) Administered  . Influenza, High Dose Seasonal PF 04/29/2018, 03/11/2019  . Influenza,inj,Quad PF,6+ Mos 03/13/2013  . PFIZER SARS-COV-2 Vaccination 07/19/2019, 08/10/2019  . Pneumococcal Conjugate-13 02/28/2019  . Pneumococcal-Unspecified 08/01/2011  . Zoster Recombinat (Shingrix) 02/28/2019, 05/01/2019, 05/14/2019    TDAP status: Up to date Flu Vaccine status: Up to date Pneumococcal vaccine status: Up to date Covid-19 vaccine status: Completed vaccines  Qualifies for Shingles Vaccine? Yes   Zostavax completed No   Shingrix Completed?: Yes  Screening Tests Health Maintenance  Topic Date Due  . COLONOSCOPY  12/29/2019  . INFLUENZA VACCINE  01/02/2020  . HEMOGLOBIN A1C  06/17/2020  . OPHTHALMOLOGY EXAM  11/08/2020  . FOOT EXAM  03/02/2021  . TETANUS/TDAP  06/18/2022  . DEXA  SCAN  Completed  . COVID-19 Vaccine  Completed  . Hepatitis C Screening  Completed  . PNA vac Low Risk Adult  Completed    Health Maintenance  Health Maintenance Due  Topic Date Due  . COLONOSCOPY  12/29/2019  . INFLUENZA VACCINE  01/02/2020    Colorectal cancer screening: going to be scheduled Mammogram status: patient to schedule Bone Density status: Completed 03/23/2015.  Lung Cancer Screening: (Low Dose CT Chest recommended if Age 37-80 years, 30 pack-year currently smoking OR have quit w/in 15years.) does not qualify.   Lung Cancer Screening Referral: no  Additional Screening:  Hepatitis C Screening: does qualify; Completed 06/28/2009  Vision Screening: Recommended annual ophthalmology exams for early detection of glaucoma and other disorders of the eye. Is the patient up to date with their annual eye exam?  No  Who is the provider or what is the name of the office in which the patient attends annual eye exams? Dr. Venetia Maxon If pt is not established with a provider, would they like to be referred to a provider to establish care? No .   Dental Screening: Recommended annual dental exams for proper oral hygiene  Community Resource Referral / Chronic Care Management: CRR required this visit?  No   CCM required this visit?  No      Plan:     I have personally reviewed and noted the following in the patient's chart:   . Medical and social history . Use of alcohol, tobacco or illicit drugs  . Current medications and supplements . Functional ability and status . Nutritional status . Physical activity . Advanced directives . List of other physicians . Hospitalizations, surgeries, and ER visits in previous 12 months . Vitals . Screenings to include cognitive, depression, and falls . Referrals and appointments  In addition, I have reviewed and discussed with patient certain preventive protocols, quality metrics, and best practice recommendations. A written  personalized care plan for preventive services as well as general preventive health recommendations were provided to patient.     Kellie Simmering, LPN   [id number]   Nurse Notes:

## 2020-03-02 NOTE — Patient Instructions (Signed)
[name] , Thank you for taking time to come for your Medicare Wellness Visit. I appreciate your ongoing commitment to your health goals. Please review the following plan we discussed and let me know if I can assist you in the future.   Screening recommendations/referrals: Colonoscopy: due to be scheduled Mammogram: patient to schedule Bone Density: completed 03/23/2015 Recommended yearly ophthalmology/optometry visit for glaucoma screening and checkup Recommended yearly dental visit for hygiene and checkup  Vaccinations: Influenza vaccine: due Pneumococcal vaccine: completed 02/28/2019 Tdap vaccine: completed 06/18/2012 Shingles vaccine: completed   Covid-19: 08/10/2019, 07/19/2019  Advanced directives: Please bring a copy of your POA (Power of Attorney) and/or Living Will to your next appointment.   Conditions/risks identified: smoking  Next appointment: 07/04/2020 at 2:00 Follow up in one year for your annual wellness visit    Preventive Care 61 Years and Older, Female Preventive care refers to lifestyle choices and visits with your health care provider that can promote health and wellness. What does preventive care include?  A yearly physical exam. This is also called an annual well check.  Dental exams once or twice a year.  Routine eye exams. Ask your health care provider how often you should have your eyes checked.  Personal lifestyle choices, including:  Daily care of your teeth and gums.  Regular physical activity.  Eating a healthy diet.  Avoiding tobacco and drug use.  Limiting alcohol use.  Practicing safe sex.  Taking low-dose aspirin every day.  Taking vitamin and mineral supplements as recommended by your health care provider. What happens during an annual well check? The services and screenings done by your health care provider during your annual well check will depend on your age, overall health, lifestyle risk factors, and family history of  disease. Counseling  Your health care provider may ask you questions about your:  Alcohol use.  Tobacco use.  Drug use.  Emotional well-being.  Home and relationship well-being.  Sexual activity.  Eating habits.  History of falls.  Memory and ability to understand (cognition).  Work and work Statistician.  Reproductive health. Screening  You may have the following tests or measurements:  Height, weight, and BMI.  Blood pressure.  Lipid and cholesterol levels. These may be checked every 5 years, or more frequently if you are over 72 years old.  Skin check.  Lung cancer screening. You may have this screening every year starting at age 75 if you have a 30-pack-year history of smoking and currently smoke or have quit within the past 15 years.  Fecal occult blood test (FOBT) of the stool. You may have this test every year starting at age 75.  Flexible sigmoidoscopy or colonoscopy. You may have a sigmoidoscopy every 5 years or a colonoscopy every 10 years starting at age 75.  Hepatitis C blood test.  Hepatitis B blood test.  Sexually transmitted disease (STD) testing.  Diabetes screening. This is done by checking your blood sugar (glucose) after you have not eaten for a while (fasting). You may have this done every 1-3 years.  Bone density scan. This is done to screen for osteoporosis. You may have this done starting at age 75.  Mammogram. This may be done every 1-2 years. Talk to your health care provider about how often you should have regular mammograms. Talk with your health care provider about your test results, treatment options, and if necessary, the need for more tests. Vaccines  Your health care provider may recommend certain vaccines, such as:  Influenza  vaccine. This is recommended every year.  Tetanus, diphtheria, and acellular pertussis (Tdap, Td) vaccine. You may need a Td booster every 10 years.  Zoster vaccine. You may need this after age  75.  Pneumococcal 13-valent conjugate (PCV13) vaccine. One dose is recommended after age 75.  Pneumococcal polysaccharide (PPSV23) vaccine. One dose is recommended after age 75. Talk to your health care provider about which screenings and vaccines you need and how often you need them. This information is not intended to replace advice given to you by your health care provider. Make sure you discuss any questions you have with your health care provider. Document Released: 06/16/2015 Document Revised: 02/07/2016 Document Reviewed: 03/21/2015 Elsevier Interactive Patient Education  2017 [place] Prevention in the Home Falls can cause injuries. They can happen to people of all ages. There are many things you can do to make your home safe and to help prevent falls. What can I do on the outside of my home?  Regularly fix the edges of walkways and driveways and fix any cracks.  Remove anything that might make you trip as you walk through a door, such as a raised step or threshold.  Trim any bushes or trees on the path to your home.  Use bright outdoor lighting.  Clear any walking paths of anything that might make someone trip, such as rocks or tools.  Regularly check to see if handrails are loose or broken. Make sure that both sides of any steps have handrails.  Any raised decks and porches should have guardrails on the edges.  Have any leaves, snow, or ice cleared regularly.  Use sand or salt on walking paths during winter.  Clean up any spills in your garage right away. This includes oil or grease spills. What can I do in the bathroom?  Use night lights.  Install grab bars by the toilet and in the tub and shower. Do not use towel bars as grab bars.  Use non-skid mats or decals in the tub or shower.  If you need to sit down in the shower, use a plastic, non-slip stool.  Keep the floor dry. Clean up any water that spills on the floor as soon as it happens.  Remove  soap buildup in the tub or shower regularly.  Attach bath mats securely with double-sided non-slip rug tape.  Do not have throw rugs and other things on the floor that can make you trip. What can I do in the bedroom?  Use night lights.  Make sure that you have a light by your bed that is easy to reach.  Do not use any sheets or blankets that are too big for your bed. They should not hang down onto the floor.  Have a firm chair that has side arms. You can use this for support while you get dressed.  Do not have throw rugs and other things on the floor that can make you trip. What can I do in the kitchen?  Clean up any spills right away.  Avoid walking on wet floors.  Keep items that you use a lot in easy-to-reach places.  If you need to reach something above you, use a strong step stool that has a grab bar.  Keep electrical cords out of the way.  Do not use floor polish or wax that makes floors slippery. If you must use wax, use non-skid floor wax.  Do not have throw rugs and other things on the floor that can  make you trip. What can I do with my stairs?  Do not leave any items on the stairs.  Make sure that there are handrails on both sides of the stairs and use them. Fix handrails that are broken or loose. Make sure that handrails are as long as the stairways.  Check any carpeting to make sure that it is firmly attached to the stairs. Fix any carpet that is loose or worn.  Avoid having throw rugs at the top or bottom of the stairs. If you do have throw rugs, attach them to the floor with carpet tape.  Make sure that you have a light switch at the top of the stairs and the bottom of the stairs. If you do not have them, ask someone to add them for you. What else can I do to help prevent falls?  Wear shoes that:  Do not have high heels.  Have rubber bottoms.  Are comfortable and fit you well.  Are closed at the toe. Do not wear sandals.  If you use a  stepladder:  Make sure that it is fully opened. Do not climb a closed stepladder.  Make sure that both sides of the stepladder are locked into place.  Ask someone to hold it for you, if possible.  Clearly mark and make sure that you can see:  Any grab bars or handrails.  First and last steps.  Where the edge of each step is.  Use tools that help you move around (mobility aids) if they are needed. These include:  Canes.  Walkers.  Scooters.  Crutches.  Turn on the lights when you go into a dark area. Replace any light bulbs as soon as they burn out.  Set up your furniture so you have a clear path. Avoid moving your furniture around.  If any of your floors are uneven, fix them.  If there are any pets around you, be aware of where they are.  Review your medicines with your doctor. Some medicines can make you feel dizzy. This can increase your chance of falling. Ask your doctor what other things that you can do to help prevent falls. This information is not intended to replace advice given to you by your health care provider. Make sure you discuss any questions you have with your health care provider. Document Released: 03/16/2009 Document Revised: 10/26/2015 Document Reviewed: 06/24/2014 Elsevier Interactive Patient Education  2017 [employer].

## 2020-03-04 LAB — PROTEIN ELECTROPHORESIS, SERUM
A/G Ratio: 1 (ref 0.7–1.7)
Albumin ELP: 3.5 g/dL (ref 2.9–4.4)
Alpha 1: 0.3 g/dL (ref 0.0–0.4)
Alpha 2: 1.1 g/dL — ABNORMAL HIGH (ref 0.4–1.0)
Beta: 1.1 g/dL (ref 0.7–1.3)
Gamma Globulin: 1 g/dL (ref 0.4–1.8)
Globulin, Total: 3.4 g/dL (ref 2.2–3.9)
Total Protein: 6.9 g/dL (ref 6.0–8.5)

## 2020-03-04 LAB — BMP8+EGFR
BUN/Creatinine Ratio: 27 (ref 12–28)
BUN: 45 mg/dL — ABNORMAL HIGH (ref 8–27)
CO2: 23 mmol/L (ref 20–29)
Calcium: 9.6 mg/dL (ref 8.7–10.3)
Chloride: 99 mmol/L (ref 96–106)
Creatinine, Ser: 1.66 mg/dL — ABNORMAL HIGH (ref 0.57–1.00)
GFR calc Af Amer: 35 mL/min/{1.73_m2} — ABNORMAL LOW (ref >59)
GFR calc non Af Amer: 30 mL/min/{1.73_m2} — ABNORMAL LOW (ref >59)
Glucose: 102 mg/dL — ABNORMAL HIGH (ref 65–99)
Potassium: 4.4 mmol/L (ref 3.5–5.2)
Sodium: 137 mmol/L (ref 134–144)

## 2020-03-04 LAB — LIPID PANEL
Chol/HDL Ratio: 4.1 ratio (ref 0.0–4.4)
Cholesterol, Total: 160 mg/dL (ref 100–199)
HDL: 39 mg/dL — ABNORMAL LOW (ref >39)
LDL Chol Calc (NIH): 98 mg/dL (ref 0–99)
Triglycerides: 130 mg/dL (ref 0–149)
VLDL Cholesterol Cal: 23 mg/dL (ref 5–40)

## 2020-03-04 LAB — VITAMIN D 25 HYDROXY (VIT D DEFICIENCY, FRACTURES): Vit D, 25-Hydroxy: 49.5 ng/mL (ref 30.0–100.0)

## 2020-03-04 LAB — PTH, INTACT AND CALCIUM: PTH: 66 pg/mL — ABNORMAL HIGH (ref 15–65)

## 2020-03-04 LAB — PHOSPHORUS: Phosphorus: 3.8 mg/dL (ref 3.0–4.3)

## 2020-03-07 ENCOUNTER — Other Ambulatory Visit: Payer: Self-pay

## 2020-03-07 ENCOUNTER — Ambulatory Visit (INDEPENDENT_AMBULATORY_CARE_PROVIDER_SITE_OTHER): Payer: Medicare Other

## 2020-03-07 ENCOUNTER — Ambulatory Visit (HOSPITAL_COMMUNITY): Payer: Medicare Other | Attending: Cardiology

## 2020-03-07 VITALS — BP 124/76 | HR 71 | Temp 97.7°F | Ht 67.0 in | Wt 226.0 lb

## 2020-03-07 DIAGNOSIS — I130 Hypertensive heart and chronic kidney disease with heart failure and stage 1 through stage 4 chronic kidney disease, or unspecified chronic kidney disease: Secondary | ICD-10-CM | POA: Insufficient documentation

## 2020-03-07 DIAGNOSIS — Z23 Encounter for immunization: Secondary | ICD-10-CM

## 2020-03-07 DIAGNOSIS — J439 Emphysema, unspecified: Secondary | ICD-10-CM | POA: Insufficient documentation

## 2020-03-07 DIAGNOSIS — I509 Heart failure, unspecified: Secondary | ICD-10-CM | POA: Insufficient documentation

## 2020-03-07 DIAGNOSIS — N189 Chronic kidney disease, unspecified: Secondary | ICD-10-CM | POA: Insufficient documentation

## 2020-03-07 DIAGNOSIS — I5041 Acute combined systolic (congestive) and diastolic (congestive) heart failure: Secondary | ICD-10-CM | POA: Diagnosis not present

## 2020-03-07 DIAGNOSIS — N186 End stage renal disease: Secondary | ICD-10-CM

## 2020-03-07 DIAGNOSIS — I429 Cardiomyopathy, unspecified: Secondary | ICD-10-CM | POA: Diagnosis not present

## 2020-03-07 DIAGNOSIS — R9430 Abnormal result of cardiovascular function study, unspecified: Secondary | ICD-10-CM | POA: Insufficient documentation

## 2020-03-07 DIAGNOSIS — I4891 Unspecified atrial fibrillation: Secondary | ICD-10-CM | POA: Diagnosis not present

## 2020-03-07 DIAGNOSIS — F17200 Nicotine dependence, unspecified, uncomplicated: Secondary | ICD-10-CM | POA: Diagnosis not present

## 2020-03-07 DIAGNOSIS — E1122 Type 2 diabetes mellitus with diabetic chronic kidney disease: Secondary | ICD-10-CM

## 2020-03-07 LAB — ECHOCARDIOGRAM COMPLETE
Area-P 1/2: 2.09 cm2
Height: 67 in
S' Lateral: 3.8 cm
Weight: 3616 oz

## 2020-03-07 LAB — HEMOGLOBIN A1C
Est. average glucose Bld gHb Est-mCnc: 137 mg/dL
Hgb A1c MFr Bld: 6.4 % — ABNORMAL HIGH (ref 4.8–5.6)

## 2020-03-07 NOTE — Progress Notes (Signed)
Pt is here for a flu shot

## 2020-03-10 ENCOUNTER — Ambulatory Visit: Payer: Medicare Other | Admitting: Cardiovascular Disease

## 2020-03-13 ENCOUNTER — Other Ambulatory Visit: Payer: Self-pay | Admitting: Internal Medicine

## 2020-03-22 ENCOUNTER — Other Ambulatory Visit: Payer: Self-pay | Admitting: Internal Medicine

## 2020-03-29 ENCOUNTER — Ambulatory Visit: Payer: Medicare Other

## 2020-04-05 ENCOUNTER — Ambulatory Visit: Payer: Medicare Other

## 2020-04-11 ENCOUNTER — Ambulatory Visit
Admission: RE | Admit: 2020-04-11 | Discharge: 2020-04-11 | Disposition: A | Discharge status: Home / Self Care | Payer: Medicare Other | Source: Ambulatory Visit | Attending: Internal Medicine | Admitting: Internal Medicine

## 2020-04-11 DIAGNOSIS — I2510 Atherosclerotic heart disease of native coronary artery without angina pectoris: Secondary | ICD-10-CM | POA: Diagnosis not present

## 2020-04-11 DIAGNOSIS — Z87891 Personal history of nicotine dependence: Secondary | ICD-10-CM | POA: Diagnosis not present

## 2020-04-11 DIAGNOSIS — J439 Emphysema, unspecified: Secondary | ICD-10-CM | POA: Diagnosis not present

## 2020-04-11 DIAGNOSIS — E042 Nontoxic multinodular goiter: Secondary | ICD-10-CM | POA: Diagnosis not present

## 2020-04-11 DIAGNOSIS — Z720 Tobacco use: Secondary | ICD-10-CM

## 2020-04-13 ENCOUNTER — Other Ambulatory Visit: Payer: Self-pay | Admitting: Internal Medicine

## 2020-04-13 DIAGNOSIS — J438 Other emphysema: Secondary | ICD-10-CM

## 2020-05-02 ENCOUNTER — Other Ambulatory Visit: Payer: Self-pay | Admitting: Internal Medicine

## 2020-05-04 ENCOUNTER — Ambulatory Visit: Payer: Medicare Other | Admitting: Cardiovascular Disease

## 2020-05-04 ENCOUNTER — Other Ambulatory Visit: Payer: Self-pay

## 2020-05-04 ENCOUNTER — Encounter: Payer: Self-pay | Admitting: Cardiovascular Disease

## 2020-05-04 VITALS — BP 122/73 | HR 57 | Temp 97.3°F | Ht 67.0 in | Wt 224.8 lb

## 2020-05-04 DIAGNOSIS — Z5181 Encounter for therapeutic drug level monitoring: Secondary | ICD-10-CM

## 2020-05-04 DIAGNOSIS — I428 Other cardiomyopathies: Secondary | ICD-10-CM

## 2020-05-04 DIAGNOSIS — I471 Supraventricular tachycardia: Secondary | ICD-10-CM

## 2020-05-04 DIAGNOSIS — E7800 Pure hypercholesterolemia, unspecified: Secondary | ICD-10-CM

## 2020-05-04 DIAGNOSIS — I340 Nonrheumatic mitral (valve) insufficiency: Secondary | ICD-10-CM | POA: Diagnosis not present

## 2020-05-04 DIAGNOSIS — I10 Essential (primary) hypertension: Secondary | ICD-10-CM

## 2020-05-04 DIAGNOSIS — I4719 Other supraventricular tachycardia: Secondary | ICD-10-CM

## 2020-05-04 MED ORDER — LIVALO 2 MG PO TABS
2.0000 mg | ORAL_TABLET | Freq: Every day | ORAL | 3 refills | Status: DC
Start: 2020-05-04 — End: 2021-04-03

## 2020-05-04 NOTE — Progress Notes (Signed)
Cardiology Office  Note   Evaluation Performed:  Follow-up visit  Date:  06/16/2020   ID:  [name], DOB Dec 13, 1945, MRN [id number]  PCP:  [name], MD  Cardiologist:  [name], MD  Electrophysiologist:  None   Chief Complaint:  Heart failure  History of Present Illness:    [name] is a 74 y.o. female with chronic systolic and diastolic heart failure (LVEF 25 to 30%, nonischemic), PAF, diabetes, hyperlipidemia, hypertension, and tobacco abuse here for follow up.  She was previously a patient of Dr. Ron Parker and then Dr. Irish Lack.  She was first diagnosed with heart failure 06/2009.  At that time her LVEF was 25%.  This was associated with atrial fibrillation.  She reports having an ablation with Dr. [name] in 1999, though those records are not available at this time.  She has undergone cardioversions for atrial fibrillation with Dr. Ron Parker.  LVEF has ranged from 25 to 35% but has not improved despite beta-blockers and ACE inhibitor.  She had an echo 11/2011 that revealed LVEF 50 to 55%.  However subsequent studies have ranged from 20-35%.  LVEF on Lexiscan Myoview 09/2014 was 41%.  She developed a left bundle branch block pattern in 2014.  Her cardiomyopathy is nonischemic.  She last saw Dr. Irish Lack 04/2018 and carvedilol was increased due to concern for atrial tachycardia.  She felt fatigued and shaky at the higher dose.  Her ACE inhibitor was reduced 2/2 CKD III.  Prior to that she called the office 12/2017 with volume overload.  She previously discussed ICD with Dr. Irish Lack but was not interested.  We continued this discussion and she continues to want to think about it.  She was considering CRT-D after coronavirus.  Carvedilol was switched to metoprolol due to hypotension and tachycardia.  Her heart rate remained elevated but her energy levels improved.  Digoxin was started due to poor heart rate control and heart failure.  Ms. Dicamillo had a recent episode of syncope.   She was referred for an ambulatory monitor which revealed atrial fibrillation with PVCs.  She saw Dr. [name] on 4/6 who thought she was actually in atrial tachycardia.  He recommended waiting on ICD to see if her LVEF  Improved and felt she was unlikely to benefit from CRT.  After her last appointment Ms. Bowlds underwent successful DCCV for atrial tachycardia on 10/15/19.  Since her last appointment she has been struggling with pain in her right hand.  She is working with Dr. Baird Cancer on that.  From a cardiac standpoint she has been doing well.  Her heart rate has been consistently in the 50s-60s since cardioversion.  She no longer feels like she will pass out.  She denies lower extremity edema, orthopnea or PND.  Her weight has been stable. She recently had a lung screening CT that showed coronary calcification and emphysema.  She is motivated to quit smoking.  She continues to smoke about half pack daily but is trying to cut back.  She isn't interested in any medications.  Lately she has her good days and bad days.  Her breathing is stable and she denies chest pain.  She walks to and from her mailbox and around the house more.  She does this a couple times per day.  She has no edema, orthopnea or PND.  She checks her BP daily every morning.  Some mornings her BP is as low as 104.    Past Medical History:  Diagnosis Date  .  Arthritis    "left knee" (03/12/2013)  . Atrial fibrillation ([place])    Cardioverted to NSR 08/22/2009  . Atrial tachycardia ([place]) 09/30/2019  . Cardiomyopathy   . CHF (congestive heart failure) ([place])   . CKD (chronic kidney disease)   . Depression   . Dizziness   . DM2 (diabetes mellitus, type 2) ([place])    fasting 80-100 (03/12/2013)  . Drug therapy 1/11   coumadin  . Ejection fraction < 50%    20-25%, echo.06/2009, /  EF 30-35%, diffuse hypokinesis, echo, Oct 16, 2010  . GERD (gastroesophageal reflux disease)   . Gout    "left knee" (03/12/2013)  . History of bronchitis   .  History of emphysema   . HLD (hyperlipidemia)   . Hyperkalemia   . Hypertension   . LFT elevation    06/2009  . Mitral regurgitation    mild - echo - 1/11 /  Mild, echo, May15, 2012  . Overweight(278.02)   . Pneumonia    "@ least twice" (03/12/2013)  . Thyroid dysfunction    thyromegally,diffuse,nodule LLL,06/2009  . Tobacco abuse   . Tricuspid regurgitation    moderate - echo - 1/11   Past Surgical History:  Procedure Laterality Date  . BREAST BIOPSY Right 1980's  . BREAST LUMPECTOMY Right 1980's  . CARDIOVERSION N/A 10/15/2019   Procedure: CARDIOVERSION;  Surgeon: [name], MD;  Location: [facility];  Service: Cardiovascular;  Laterality: N/A;  . CATARACT EXTRACTION W/ INTRAOCULAR LENS  IMPLANT, BILATERAL Bilateral 2010-2012  . CHOLECYSTECTOMY  ~ 1978  . JOINT REPLACEMENT    . TONSILLECTOMY  1960's  . TOTAL KNEE ARTHROPLASTY Left 03/12/2013  . TOTAL KNEE ARTHROPLASTY Left 03/12/2013   Procedure: TOTAL KNEE ARTHROPLASTY;  Surgeon: Newt Minion, MD;  Location: [facility];  Service: Orthopedics;  Laterality: Left;  Left total knee arthroplasty  . TUBAL LIGATION    . VAGINAL HYSTERECTOMY  1987     Current Meds  Medication Sig  . acetaminophen (TYLENOL) 500 MG tablet Take 1,000 mg by mouth 3 (three) times daily as needed for pain.  . BD PEN NEEDLE NANO U/F 32G X 4 MM MISC USE AS DIRECTED WITH INSULIN PENS  . betamethasone dipropionate 0.05 % cream Apply 1 application topically 2 (two) times daily as needed (dry skin).  . Cholecalciferol (VITAMIN D) 2000 UNITS CAPS Take 2,000 Units by mouth daily.  . colchicine 0.6 MG tablet Take 0.6 mg by mouth 2 (two) times daily as needed (GOUT).   Marland Kitchen diclofenac Sodium (VOLTAREN) 1 % GEL Apply 4 g topically 4 (four) times daily.  . enalapril (VASOTEC) 5 MG tablet TAKE 1 TABLET BY MOUTH EVERYDAY AT BEDTIME  . fluticasone (FLONASE) 50 MCG/ACT nasal spray PLACE 1 SPRAY INTO BOTH NOSTRILS DAILY.  Marland Kitchen KLOR-CON M20 20 MEQ tablet TAKE 1 TABLET  (20 MEQ TOTAL) BY MOUTH DAILY. WHEN TAKING FUROSEMIDE (LASIX). (Patient taking differently: Take 20 mEq by mouth daily as needed. When taking double  furosemide (Lasix).)  . metoprolol succinate (TOPROL-XL) 50 MG 24 hr tablet TAKE 1 TABLET (50 MG TOTAL) BY MOUTH DAILY. TAKE WITH OR IMMEDIATELY FOLLOWING A MEAL.  . Multiple Vitamin (MULITIVITAMIN WITH MINERALS) TABS Take 1 tablet by mouth daily. Centrum Silver  . nystatin cream (MYCOSTATIN) Apply 1 application topically 2 (two) times daily as needed for dry skin (under breasts).  [name] VERIO test strip USE AS DIRECTED TO CHECK BLOOD SUGARS 1 TIME PER DAY DX: E11.22  . pantoprazole (PROTONIX) 20 MG  tablet Take 20 mg by mouth daily as needed for heartburn.   . Pitavastatin Calcium (LIVALO) 2 MG TABS Take 1 tablet (2 mg total) by mouth daily.  Vladimir Faster Glycol-Propyl Glycol (SYSTANE ULTRA) 0.4-0.3 % SOLN Place 1 drop into both eyes daily as needed (dry eyes).  . pravastatin (PRAVACHOL) 40 MG tablet Take 1 tablet by mouth once per week  . traMADol (ULTRAM) 50 MG tablet Take 1 tablet (50 mg total) by mouth every 12 (twelve) hours as needed.  Marland Kitchen VICTOZA 18 MG/3ML SOPN INJECT 1.2 MG UNDER THE SKIN ONCE DAILY  . vitamin C (ASCORBIC ACID) 500 MG tablet Take 500 mg by mouth daily.  . [DISCONTINUED] digoxin (LANOXIN) 0.125 MG tablet TAKE 1 TABLET BY MOUTH DAILY (Patient taking differently: Take 0.125 mg by mouth daily. )  . [DISCONTINUED] ELIQUIS 5 MG TABS tablet TAKE 1 TABLET BY MOUTH TWICE A DAY  . [DISCONTINUED] furosemide (LASIX) 40 MG tablet TAKE 1 TABLET BY MOUITH DAILY AS NEEDED FOR EDEMA FOR FLUID OVERLOAD  . [DISCONTINUED] Pitavastatin Calcium (LIVALO) 2 MG TABS Take 2 mg by mouth See admin instructions. Pt take 1 tablet M-W-F      Allergies:   Codeine, Augmentin [amoxicillin-pot clavulanate], Coreg [carvedilol], and Warfarin and related   Social History   Tobacco Use  . Smoking status: Current Every Day Smoker    Packs/day: 0.50     Years: 40.00    Pack years: 20.00    Types: Cigarettes  . Smokeless tobacco: Never Used  . Tobacco comment: smoked 1.5ppd x 9, 1ppdx40, cut back to 1/2ppd past 5 years  Vaping Use  . Vaping Use: Never used  Substance Use Topics  . Alcohol use: No  . Drug use: No     Family Hx: The patient's family history includes Colon cancer in an other family member; Heart attack in her son; Heart disease in an other family member; Hypertension in her sister and son; Prostate cancer in her father; Stroke in her maternal grandfather, mother, and paternal grandmother.  ROS:   Please see the history of present illness.     All other systems reviewed and are negative.   Prior CV studies:   The following studies were reviewed today:  Echo 07/28/15: Study Conclusions  - Left ventricle: The cavity size was normal. There was moderate   concentric hypertrophy. Systolic function was severely reduced.   The estimated ejection fraction was in the range of 25% to 30%.   Diffuse hypokinesis. Features are consistent with a pseudonormal   left ventricular filling pattern, with concomitant abnormal   relaxation and increased filling pressure (grade 2 diastolic   dysfunction). Doppler parameters are consistent with high   ventricular filling pressure. - Aortic valve: Transvalvular velocity was within the normal range.   There was no stenosis. There was no regurgitation. - Mitral valve: Calcified annulus. Mildly thickened leaflets .   There was trivial regurgitation. - Left atrium: The atrium was mildly dilated. - Right ventricle: The cavity size was normal. Wall thickness was   normal. Systolic function was normal. - Atrial septum: A patent foramen ovale cannot be excluded. - Tricuspid valve: There was mild regurgitation. Peak RV-RA   gradient (S): 30 mm Hg. - Inferior vena cava: The vessel was normal in size. The   respirophasic diameter changes were in the normal range (= 50%),   consistent with  normal central venous pressure.  Lexiscan Myoview 09/08/14: LVEF 41%.  No ischemia.  Echo 03/07/20:  1.  Since the prior study on 02/23/2019 LVEF has increased from 30-35% to  40-45%.  2. There is a false tending in the left ventricular apex. Left  ventricular ejection fraction, by estimation, is 40 to 45%. The left  ventricle has mildly decreased function. The left ventricle demonstrates  global hypokinesis. Left ventricular diastolic  parameters are consistent with Grade II diastolic dysfunction  (pseudonormalization). Elevated left atrial pressure. The average left  ventricular global longitudinal strain is -13.7 %. The global longitudinal  strain is abnormal.  3. Right ventricular systolic function is normal. The right ventricular  size is normal.  4. Left atrial size was moderately dilated.  5. The mitral valve is normal in structure. Mild mitral valve  regurgitation. No evidence of mitral stenosis. Severe mitral annular  calcification.  6. The aortic valve is normal in structure. There is mild calcification  of the aortic valve. There is mild thickening of the aortic valve. Aortic  valve regurgitation is not visualized. Mild to moderate aortic valve  sclerosis/calcification is present,  without any evidence of aortic stenosis.  7. The inferior vena cava is normal in size with greater than 50%  respiratory variability, suggesting right atrial pressure of 3 mmHg.   Labs/Other Tests and Data Reviewed:    EKG:  An ECG dated 04/22/19 was personally reviewed today and demonstrated:  Ectopic atrial rhythm.  Rate 95 bpm.  PVCs.  IVCD.  QRS 130 ms.   Recent Labs: 12/16/2019: ALT 12; Hemoglobin 13.4; Platelets 266; TSH 1.280 03/02/2020: BUN 45; Creatinine, Ser 1.66; Potassium 4.4; Sodium 137   Recent Lipid Panel Lab Results  Component Value Date/Time   CHOL 160 03/02/2020 04:03 PM   TRIG 130 03/02/2020 04:03 PM   HDL 39 (L) 03/02/2020 04:03 PM   CHOLHDL 4.1 03/02/2020 04:03 PM    CHOLHDL 4.8 06/27/2009 03:28 AM   LDLCALC 98 03/02/2020 04:03 PM    Wt Readings from Last 3 Encounters:  05/04/20 224 lb 12.8 oz (102 kg)  03/07/20 226 lb (102.5 kg)  03/02/20 220 lb (99.8 kg)     Objective:    VS:  BP 122/73   Pulse (!) 57   Temp (!) 97.3 F (36.3 C)   Ht 5\' 7"  (1.702 m)   Wt 224 lb 12.8 oz (102 kg)   SpO2 98%   BMI 35.21 kg/m  , BMI Body mass index is 35.21 kg/m. GENERAL:  Well appearing HEENT: Pupils equal round and reactive, fundi not visualized, oral mucosa unremarkable NECK:  No jugular venous distention, waveform within normal limits, carotid upstroke brisk and symmetric, no bruits LUNGS:  Clear to auscultation bilaterally HEART:  RRR.  PMI not displaced or sustained,S1 and S2 within normal limits, no S3, no S4, no clicks, no rubs, no murmurs ABD:  Flat, positive bowel sounds normal in frequency in pitch, no bruits, no rebound, no guarding, no midline pulsatile mass, no hepatomegaly, no splenomegaly EXT:  2 plus pulses throughout, no edema, no cyanosis no clubbing SKIN:  No rashes no nodules NEURO:  Cranial nerves II through XII grossly intact, motor grossly intact throughout PSYCH:  Cognitively intact, oriented to person place and time   ASSESSMENT & PLAN:    # Syncope:  Improved since she is no longer in atrial tachycardia.   # Atrial tachycardia;  Resolved.  Continue metoprolol and digoxin.  She will come for a digoxin level at the same time when she gets her lipids checked.  # Acute on chronic systolic and diastolic heart  failure: NICM.  LVEF has improved to 40 to 45%.  Continue metoprolol and enalapril.  Continue digoxin. We'll check a digoxin level when her lipids are reassessed.  # Asymptomatic CAD: Noted on chest CT.  Continue pitavastatin. LDL goal <70  # Hyperlipidemia: Ms. Giancola is on pitavastatin 2mg  MWF.  LDL goal is less than 70 given her coronary calcification.  Check lipids and a CMP in 3 months and increase it to  daily.  # Persistent atrial fibrillation: H/o atrial fibrillation s/p remote ablation.  Continue metoprolol and digoxin.  She does not want to stop Eliquis.    # Tobacco abuse: Wants to cut back again.  Not ready to do it yet.    Medication Adjustments/Labs and Tests Ordered: Current medicines are reviewed at length with the patient today.  Concerns regarding medicines are outlined above.   Tests Ordered: Orders Placed This Encounter  Procedures  . Comprehensive metabolic panel  . Lipid panel  . Digoxin level    Medication Changes: Meds ordered this encounter  Medications  . Pitavastatin Calcium (LIVALO) 2 MG TABS    Sig: Take 1 tablet (2 mg total) by mouth daily.    Dispense:  90 tablet    Refill:  3    NEW DOSE, D/C PREVIOUS RX    Disposition:  Follow up in 6 month(s)  Signed, [name], MD  06/16/2020 10:41 AM    [place]

## 2020-05-04 NOTE — Patient Instructions (Addendum)
Medication Instructions:  INCREASE YOUR PITAVASTATIN TO DAILY   *If you need a refill on your cardiac medications before your next appointment, please call your pharmacy*  Lab Work: FASTING LP/CMET/DIG LEVEL IN 3 MONTHS DO NOT TAKE YOUR DIGOXIN THAT MORNING   If you have labs (blood work) drawn today and your tests are completely normal, you will receive your results only by: Marland Kitchen MyChart Message (if you have MyChart) OR . A paper copy in the mail If you have any lab test that is abnormal or we need to change your treatment, we will call you to review the results.  Testing/Procedures: NONE  Follow-Up: At [facility], you and your health needs are our priority.  As part of our continuing mission to provide you with exceptional heart care, we have created designated Provider Care Teams.  These Care Teams include your primary Cardiologist (physician) and Advanced Practice Providers (APPs -  Physician Assistants and Nurse Practitioners) who all work together to provide you with the care you need, when you need it.  We recommend signing up for the patient portal called "MyChart".  Sign up information is provided on this After Visit Summary.  MyChart is used to connect with patients for Virtual Visits (Telemedicine).  Patients are able to view lab/test results, encounter notes, upcoming appointments, etc.  Non-urgent messages can be sent to your provider as well.   To learn more about what you can do with MyChart, go to [email or web address].    Your next appointment:   6 month(s)  The format for your next appointment:   In Person  Provider:   DR [facility]    Other Instructions  INCREASE YOUR EXERCIS

## 2020-05-05 ENCOUNTER — Other Ambulatory Visit: Payer: Self-pay | Admitting: Internal Medicine

## 2020-06-13 ENCOUNTER — Other Ambulatory Visit: Payer: Self-pay | Admitting: *Deleted

## 2020-06-13 ENCOUNTER — Other Ambulatory Visit: Payer: Self-pay | Admitting: Cardiovascular Disease

## 2020-06-13 MED ORDER — FUROSEMIDE 40 MG PO TABS
ORAL_TABLET | ORAL | 2 refills | Status: DC
Start: 2020-06-13 — End: 2021-03-28

## 2020-06-16 ENCOUNTER — Encounter: Payer: Self-pay | Admitting: Cardiovascular Disease

## 2020-07-04 ENCOUNTER — Ambulatory Visit: Payer: Medicare Other | Admitting: Internal Medicine

## 2020-07-26 ENCOUNTER — Other Ambulatory Visit: Payer: Self-pay | Admitting: Internal Medicine

## 2020-08-07 ENCOUNTER — Encounter: Payer: Self-pay | Admitting: Internal Medicine

## 2020-08-07 ENCOUNTER — Other Ambulatory Visit: Payer: Self-pay

## 2020-08-07 ENCOUNTER — Ambulatory Visit (INDEPENDENT_AMBULATORY_CARE_PROVIDER_SITE_OTHER): Payer: Medicare Other | Admitting: Internal Medicine

## 2020-08-07 VITALS — BP 132/78 | HR 50 | Temp 97.9°F | Ht 67.0 in | Wt 222.6 lb

## 2020-08-07 DIAGNOSIS — N183 Chronic kidney disease, stage 3 unspecified: Secondary | ICD-10-CM

## 2020-08-07 DIAGNOSIS — I700 Atherosclerosis of aorta: Secondary | ICD-10-CM

## 2020-08-07 DIAGNOSIS — Z79899 Other long term (current) drug therapy: Secondary | ICD-10-CM | POA: Diagnosis not present

## 2020-08-07 DIAGNOSIS — Z1211 Encounter for screening for malignant neoplasm of colon: Secondary | ICD-10-CM

## 2020-08-07 DIAGNOSIS — E1122 Type 2 diabetes mellitus with diabetic chronic kidney disease: Secondary | ICD-10-CM

## 2020-08-07 DIAGNOSIS — E7800 Pure hypercholesterolemia, unspecified: Secondary | ICD-10-CM | POA: Diagnosis not present

## 2020-08-07 DIAGNOSIS — Z6834 Body mass index (BMI) 34.0-34.9, adult: Secondary | ICD-10-CM

## 2020-08-07 DIAGNOSIS — Z794 Long term (current) use of insulin: Secondary | ICD-10-CM

## 2020-08-07 DIAGNOSIS — I130 Hypertensive heart and chronic kidney disease with heart failure and stage 1 through stage 4 chronic kidney disease, or unspecified chronic kidney disease: Secondary | ICD-10-CM

## 2020-08-07 DIAGNOSIS — E6609 Other obesity due to excess calories: Secondary | ICD-10-CM

## 2020-08-07 NOTE — Patient Instructions (Signed)

## 2020-08-07 NOTE — Progress Notes (Signed)
I,[name],acting as a Education administrator for Maximino Greenland, MD.,have documented all relevant documentation on the behalf of Maximino Greenland, MD,as directed by  Maximino Greenland, MD while in the presence of Maximino Greenland, MD.  This visit occurred during the SARS-CoV-2 public health emergency.  Safety protocols were in place, including screening questions prior to the visit, additional usage of staff PPE, and extensive cleaning of exam room while observing appropriate contact time as indicated for disinfecting solutions.  Subjective:     Patient ID: [name] , female    DOB: [birth year] , 75 y.o.   MRN: [id number]   Chief Complaint  Patient presents with  . Diabetes  . Hypertension    HPI  The patient is here for a follow-up on her diabetes and blood pressure.  She reports compliance with meds. She denies headaches,chest pain and shortness of breath.   Diabetes She presents for her follow-up diabetic visit. She has type 2 diabetes mellitus. Her disease course has been stable. There are no hypoglycemic associated symptoms. Pertinent negatives for diabetes include no blurred vision. There are no hypoglycemic complications. Diabetic complications include nephropathy. Risk factors for coronary artery disease include diabetes mellitus, dyslipidemia, hypertension, obesity, post-menopausal and sedentary lifestyle. Meal planning includes avoidance of concentrated sweets. She participates in exercise intermittently. An ACE inhibitor/angiotensin II receptor blocker is being taken. Eye exam is current.  Hypertension This is a chronic problem. The current episode started more than 1 year ago. The problem has been gradually improving since onset. The problem is controlled. Pertinent negatives include no blurred vision. Risk factors for coronary artery disease include diabetes mellitus, dyslipidemia, obesity, post-menopausal state and sedentary lifestyle. The current treatment provides moderate  improvement. Compliance problems include exercise.      Past Medical History:  Diagnosis Date  . Arthritis    "left knee" (03/12/2013)  . Atrial fibrillation ([place])    Cardioverted to NSR 08/22/2009  . Atrial tachycardia ([place]) 09/30/2019  . Cardiomyopathy   . CHF (congestive heart failure) ([place])   . CKD (chronic kidney disease)   . Depression   . Dizziness   . DM2 (diabetes mellitus, type 2) ([place])    fasting 80-100 (03/12/2013)  . Drug therapy 1/11   coumadin  . Ejection fraction < 50%    20-25%, echo.06/2009, /  EF 30-35%, diffuse hypokinesis, echo, Oct 16, 2010  . GERD (gastroesophageal reflux disease)   . Gout    "left knee" (03/12/2013)  . History of bronchitis   . History of emphysema   . HLD (hyperlipidemia)   . Hyperkalemia   . Hypertension   . LFT elevation    06/2009  . Mitral regurgitation    mild - echo - 1/11 /  Mild, echo, May15, 2012  . Overweight(278.02)   . Pneumonia    "@ least twice" (03/12/2013)  . Thyroid dysfunction    thyromegally,diffuse,nodule LLL,06/2009  . Tobacco abuse   . Tricuspid regurgitation    moderate - echo - 1/11     Family History  Problem Relation Age of Onset  . Stroke Mother   . Prostate cancer Father   . Colon cancer Other   . Heart disease Other   . Heart attack Son   . Hypertension Sister   . Hypertension Son        [id number]  . Stroke Maternal Grandfather   . Stroke Paternal Grandmother      Current Outpatient Medications:  .  acetaminophen (TYLENOL) 500 MG tablet,  Take 1,000 mg by mouth 3 (three) times daily as needed for pain., Disp: , Rfl:  .  BD PEN NEEDLE NANO U/F 32G X 4 MM MISC, USE AS DIRECTED WITH INSULIN PENS, Disp: 100 each, Rfl: 11 .  Cholecalciferol (VITAMIN D) 2000 UNITS CAPS, Take 2,000 Units by mouth daily., Disp: , Rfl:  .  colchicine 0.6 MG tablet, Take 0.6 mg by mouth 2 (two) times daily as needed (GOUT). , Disp: , Rfl:  .  diclofenac Sodium (VOLTAREN) 1 % GEL, Apply 4 g topically 4 (four) times daily.,  Disp: 100 g, Rfl: 0 .  digoxin (LANOXIN) 0.125 MG tablet, TAKE 1 TABLET BY MOUTH DAILY, Disp: 90 tablet, Rfl: 3 .  ELIQUIS 5 MG TABS tablet, TAKE 1 TABLET BY MOUTH TWICE A DAY, Disp: 180 tablet, Rfl: 1 .  enalapril (VASOTEC) 5 MG tablet, TAKE 1 TABLET BY MOUTH EVERYDAY AT BEDTIME, Disp: 90 tablet, Rfl: 3 .  fluticasone (FLONASE) 50 MCG/ACT nasal spray, PLACE 1 SPRAY INTO BOTH NOSTRILS DAILY., Disp: 16 mL, Rfl: 2 .  furosemide (LASIX) 40 MG tablet, TAKE 1 TABLET BY MOUITH DAILY AS NEEDED FOR EDEMA FOR FLUID OVERLOAD, Disp: 90 tablet, Rfl: 2 .  KLOR-CON M20 20 MEQ tablet, TAKE 1 TABLET (20 MEQ TOTAL) BY MOUTH DAILY. WHEN TAKING FUROSEMIDE (LASIX). (Patient taking differently: Take 20 mEq by mouth daily as needed. When taking double  furosemide (Lasix).), Disp: 90 tablet, Rfl: 2 .  metoprolol succinate (TOPROL-XL) 50 MG 24 hr tablet, TAKE 1 TABLET (50 MG TOTAL) BY MOUTH DAILY. TAKE WITH OR IMMEDIATELY FOLLOWING A MEAL., Disp: 90 tablet, Rfl: 3 .  Multiple Vitamin (MULITIVITAMIN WITH MINERALS) TABS, Take 1 tablet by mouth daily. Centrum Silver, Disp: , Rfl:  .  nystatin cream (MYCOSTATIN), Apply 1 application topically 2 (two) times daily as needed for dry skin (under breasts)., Disp: , Rfl:  .  ONETOUCH VERIO test strip, USE AS DIRECTED TO CHECK BLOOD SUGARS 1 TIME PER DAY DX: E11.22, Disp: 50 strip, Rfl: 7 .  pantoprazole (PROTONIX) 20 MG tablet, Take 20 mg by mouth daily as needed for heartburn. , Disp: , Rfl:  .  Pitavastatin Calcium (LIVALO) 2 MG TABS, Take 1 tablet (2 mg total) by mouth daily., Disp: 90 tablet, Rfl: 3 .  Polyethyl Glycol-Propyl Glycol (SYSTANE ULTRA) 0.4-0.3 % SOLN, Place 1 drop into both eyes daily as needed (dry eyes)., Disp: , Rfl:  .  traMADol (ULTRAM) 50 MG tablet, Take 1 tablet (50 mg total) by mouth every 12 (twelve) hours as needed., Disp: 30 tablet, Rfl: 0 .  VICTOZA 18 MG/3ML SOPN, INJECT 1.2 MG UNDER THE SKIN ONCE DAILY, Disp: 9 mL, Rfl: 3 .  vitamin C (ASCORBIC ACID)  500 MG tablet, Take 500 mg by mouth daily., Disp: , Rfl:  .  betamethasone dipropionate 0.05 % cream, Apply 1 application topically 2 (two) times daily as needed (dry skin)., Disp: 15 g, Rfl: 0   Allergies  Allergen Reactions  . Codeine Hives and Nausea And Vomiting  . Augmentin [Amoxicillin-Pot Clavulanate] Diarrhea  . Coreg [Carvedilol]     No energy  . Warfarin And Related     Severe rectal bleeding     Review of Systems  Constitutional: Negative.   Eyes: Negative for blurred vision.  Respiratory: Negative.   Cardiovascular: Negative.   Gastrointestinal: Negative.   Psychiatric/Behavioral: Negative.   All other systems reviewed and are negative.    Today's Vitals   08/07/20 1050  BP: 132/78  Pulse: (!) 50  Temp: 97.9 F (36.6 C)  TempSrc: Oral  Weight: 222 lb 9.6 oz (101 kg)  Height: '5\' 7"'  (1.702 m)  PainSc: 0-No pain   Body mass index is 34.86 kg/m.  Wt Readings from Last 3 Encounters:  08/07/20 222 lb 9.6 oz (101 kg)  05/04/20 224 lb 12.8 oz (102 kg)  03/07/20 226 lb (102.5 kg)   Objective:  Physical Exam Vitals and nursing note reviewed.  Constitutional:      Appearance: Normal appearance. She is obese.  HENT:     Head: Normocephalic and atraumatic.     Nose:     Comments: Masked     Mouth/Throat:     Comments: Masked  Eyes:     Extraocular Movements: Extraocular movements intact.  Cardiovascular:     Rate and Rhythm: Normal rate and regular rhythm.     Heart sounds: Normal heart sounds.  Pulmonary:     Effort: Pulmonary effort is normal.     Breath sounds: Normal breath sounds.  Musculoskeletal:     Cervical back: Normal range of motion.  Skin:    General: Skin is warm.  Neurological:     General: No focal deficit present.     Mental Status: She is alert and oriented to person, place, and time.         Assessment And Plan:     1. Type 2 diabetes mellitus with stage 3 chronic kidney disease, with long-term current use of insulin,  unspecified whether stage 3a or 3b CKD ([place]) Comments: Chronic, I will check labs as listed below. Importance of dietary compliance was d/w patient. She is encouraged to avoid sugary beverages, including diet drinks - AMB Referral to [facility]  2. Hypertensive heart and renal disease with renal failure, stage 1 through stage 4 or unspecified chronic kidney disease, with heart failure (HCC) Comments: Chronic - Hemoglobin A1c - CMP14+EGFR - AMB Referral to [place]  3. Pure hypercholesterolemia Comments: Chronic, she is encouraged to take Livalo 88m as prescribed. Advised o avoid fried foods and increase exercise as well.  - Lipid panel - AMB Referral to [facility] 4. Atherosclerosis of aorta ([facility]taples Comments: Chronic, encouraged to follow heart healthy lifestyle. Advised to comply with statin therapy, increase exercise and avoid fried foods.  - Lipid panel  5. Class 1 obesity due to excess calories with serious comorbidity and body mass index (BMI) of 34.0 to 34.9 in adult Comments: She is encouraged to strive for BMi less than 30 to decrease cardiac risk. Advised to incorporate more chair exercises into her daily routine.   6. Screen for colon cancer Comments: She agrees to GI referral for CRC screening.  - Ambulatory referral to Gastroenterology  7. Drug therapy - Digoxin   Patient was given opportunity to ask questions. Patient verbalized understanding of the plan and was able to repeat key elements of the plan. All questions were answered to their satisfaction.   I, RMaximino Greenland MD, have reviewed all documentation for this visit. The documentation on 08/13/20 for the exam, diagnosis, procedures, and orders are all accurate and complete.  THE PATIENT IS ENCOURAGED TO PRACTICE SOCIAL DISTANCING DUE TO THE COVID-19 PANDEMIC.

## 2020-08-08 LAB — CMP14+EGFR
ALT: 16 IU/L (ref 0–32)
AST: 17 IU/L (ref 0–40)
Albumin/Globulin Ratio: 1.8 (ref 1.2–2.2)
Albumin: 4.5 g/dL (ref 3.7–4.7)
Alkaline Phosphatase: 94 IU/L (ref 44–121)
BUN/Creatinine Ratio: 23 (ref 12–28)
BUN: 40 mg/dL — ABNORMAL HIGH (ref 8–27)
Bilirubin Total: 0.4 mg/dL (ref 0.0–1.2)
CO2: 18 mmol/L — ABNORMAL LOW (ref 20–29)
Calcium: 9.6 mg/dL (ref 8.7–10.3)
Chloride: 100 mmol/L (ref 96–106)
Creatinine, Ser: 1.73 mg/dL — ABNORMAL HIGH (ref 0.57–1.00)
Globulin, Total: 2.5 g/dL (ref 1.5–4.5)
Glucose: 105 mg/dL — ABNORMAL HIGH (ref 65–99)
Potassium: 4.3 mmol/L (ref 3.5–5.2)
Sodium: 142 mmol/L (ref 134–144)
Total Protein: 7 g/dL (ref 6.0–8.5)
eGFR: 30 mL/min/{1.73_m2} — ABNORMAL LOW (ref >59)

## 2020-08-08 LAB — LIPID PANEL
Chol/HDL Ratio: 3.9 ratio (ref 0.0–4.4)
Cholesterol, Total: 144 mg/dL (ref 100–199)
HDL: 37 mg/dL — ABNORMAL LOW (ref >39)
LDL Chol Calc (NIH): 79 mg/dL (ref 0–99)
Triglycerides: 159 mg/dL — ABNORMAL HIGH (ref 0–149)
VLDL Cholesterol Cal: 28 mg/dL (ref 5–40)

## 2020-08-08 LAB — DIGOXIN LEVEL: Digoxin, Serum: 0.7 ng/mL (ref 0.5–0.9)

## 2020-08-08 LAB — HEMOGLOBIN A1C
Est. average glucose Bld gHb Est-mCnc: 143 mg/dL
Hgb A1c MFr Bld: 6.6 % — ABNORMAL HIGH (ref 4.8–5.6)

## 2020-08-09 ENCOUNTER — Other Ambulatory Visit: Payer: Self-pay

## 2020-08-09 MED ORDER — BETAMETHASONE DIPROPIONATE 0.05 % EX CREA: 1.0000 "application " | TOPICAL_CREAM | Freq: Two times a day (BID) | CUTANEOUS | 0 refills | Status: DC | PRN

## 2020-08-18 ENCOUNTER — Other Ambulatory Visit: Payer: Self-pay | Admitting: Internal Medicine

## 2020-08-18 DIAGNOSIS — Z1231 Encounter for screening mammogram for malignant neoplasm of breast: Secondary | ICD-10-CM

## 2020-08-22 ENCOUNTER — Telehealth: Payer: Self-pay | Admitting: *Deleted

## 2020-08-22 NOTE — Chronic Care Management (AMB) (Signed)
  Chronic Care Management   Note  08/22/2020 Name: [name] MRN: [id number] DOB: 29-Dec-1945  [name] is a 75 y.o. year old female who is a primary care patient of [name], MD. I reached out to [name] by phone today in response to a referral sent by Ms. Shelby Curet Gunia's PCP, [name], MD     [name] was given information about Chronic Care Management services today including:  1. CCM service includes personalized support from designated clinical staff supervised by her physician, including individualized plan of care and coordination with other care providers 2. 24/7 contact phone numbers for assistance for urgent and routine care needs. 3. Service will only be billed when office clinical staff spend 20 minutes or more in a month to coordinate care. 4. Only one practitioner may furnish and bill the service in a calendar month. 5. The patient may stop CCM services at any time (effective at the end of the month) by phone call to the office staff. 6. The patient will be responsible for cost sharing (co-pay) of up to 20% of the service fee (after annual deductible is met).  Patient agreed to services and verbal consent obtained.   Follow up plan: Telephone appointment with care management team member scheduled for: BSW 08/25/2020 ,[facility] 08/29/2020  [place] Management

## 2020-08-24 ENCOUNTER — Other Ambulatory Visit: Payer: Self-pay

## 2020-08-24 ENCOUNTER — Ambulatory Visit
Admission: RE | Admit: 2020-08-24 | Discharge: 2020-08-24 | Disposition: A | Discharge status: Home / Self Care | Payer: Medicare Other | Source: Ambulatory Visit | Attending: Internal Medicine | Admitting: Internal Medicine

## 2020-08-24 DIAGNOSIS — Z1231 Encounter for screening mammogram for malignant neoplasm of breast: Secondary | ICD-10-CM

## 2020-08-25 ENCOUNTER — Ambulatory Visit: Payer: Medicare Other

## 2020-08-25 ENCOUNTER — Ambulatory Visit (INDEPENDENT_AMBULATORY_CARE_PROVIDER_SITE_OTHER): Payer: Medicare Other

## 2020-08-25 DIAGNOSIS — N1831 Chronic kidney disease, stage 3a: Secondary | ICD-10-CM | POA: Diagnosis not present

## 2020-08-25 DIAGNOSIS — I130 Hypertensive heart and chronic kidney disease with heart failure and stage 1 through stage 4 chronic kidney disease, or unspecified chronic kidney disease: Secondary | ICD-10-CM | POA: Diagnosis not present

## 2020-08-25 DIAGNOSIS — E1122 Type 2 diabetes mellitus with diabetic chronic kidney disease: Secondary | ICD-10-CM | POA: Diagnosis not present

## 2020-08-25 DIAGNOSIS — Z794 Long term (current) use of insulin: Secondary | ICD-10-CM

## 2020-08-25 DIAGNOSIS — E7800 Pure hypercholesterolemia, unspecified: Secondary | ICD-10-CM

## 2020-08-25 DIAGNOSIS — N183 Chronic kidney disease, stage 3 unspecified: Secondary | ICD-10-CM

## 2020-08-25 NOTE — Patient Instructions (Signed)
Social Worker Visit Information  Goals we discussed today:  Goals Addressed            This Visit's Progress   . Quality of Life Maintained       Timeframe:  Long-Range Goal Priority:  Medium Start Date: 3.25.22                            Expected End Date: 6.23.22                      Next planned outreach: 4.12.22  Patient Goals/Self-Care Activities . Over the next 21 days, patient will:   - Patient will self administer medications as prescribed Patient will call provider office for new concerns or questions Contact DSS to apply for [place] as needed prior to next scheduled call        Materials provided: Verbal education about LIEAP provided by phone  Ms. Lawley was given information about Chronic Care Management services today including:  1. CCM service includes personalized support from designated clinical staff supervised by her physician, including individualized plan of care and coordination with other care providers 2. 24/7 contact phone numbers for assistance for urgent and routine care needs. 3. Service will only be billed when office clinical staff spend 20 minutes or more in a month to coordinate care. 4. Only one practitioner may furnish and bill the service in a calendar month. 5. The patient may stop CCM services at any time (effective at the end of the month) by phone call to the office staff. 6. The patient will be responsible for cost sharing (co-pay) of up to 20% of the service fee (after annual deductible is met).  Patient agreed to services and verbal consent obtained.   The patient verbalized understanding of instructions, educational materials, and care plan provided today and declined offer to receive copy of patient instructions, educational materials, and care plan.   Follow up plan: SW will follow up with patient by phone over the next 21 days   Daneen Schick, BSW, CDP Social Worker, Certified Dementia Practitioner [place] [facility] [place]  Management [phone]

## 2020-08-25 NOTE — Chronic Care Management (AMB) (Signed)
Chronic Care Management   CCM RN Visit Note  08/25/2020 Name: [name] MRN: [id number] DOB: [birth year]  Subjective: [name] is a 75 y.o. year old female who is a primary care patient of [name], MD. The care management team was consulted for assistance with disease management and care coordination needs.    Engaged with patient by telephone in collaboration with Daneen Schick BSW for initial visit in response to provider referral for case management and/or care coordination services.   Consent to Services:  The patient was given the following information about Chronic Care Management services today, agreed to services, and gave verbal consent: 1. CCM service includes personalized support from designated clinical staff supervised by the primary care provider, including individualized plan of care and coordination with other care providers 2. 24/7 contact phone numbers for assistance for urgent and routine care needs. 3. Service will only be billed when office clinical staff spend 20 minutes or more in a month to coordinate care. 4. Only one practitioner may furnish and bill the service in a calendar month. 5.The patient may stop CCM services at any time (effective at the end of the month) by phone call to the office staff. 6. The patient will be responsible for cost sharing (co-pay) of up to 20% of the service fee (after annual deductible is met). Patient agreed to services and consent obtained.  Patient agreed to services and verbal consent obtained.   Assessment: Review of patient past medical history, allergies, medications, health status, including review of consultants reports, laboratory and other test data, was performed as part of comprehensive evaluation and provision of chronic care management services.    CCM Care Plan  Allergies  Allergen Reactions  . Codeine Hives and Nausea And Vomiting  . Augmentin [Amoxicillin-Pot Clavulanate] Diarrhea  . Coreg  [Carvedilol]     No energy  . Warfarin And Related     Severe rectal bleeding    Outpatient Encounter Medications as of 08/25/2020  Medication Sig  . acetaminophen (TYLENOL) 500 MG tablet Take 1,000 mg by mouth 3 (three) times daily as needed for pain.  . BD PEN NEEDLE NANO U/F 32G X 4 MM MISC USE AS DIRECTED WITH INSULIN PENS  . betamethasone dipropionate 0.05 % cream Apply 1 application topically 2 (two) times daily as needed (dry skin).  . Cholecalciferol (VITAMIN D) 2000 UNITS CAPS Take 2,000 Units by mouth daily.  . colchicine 0.6 MG tablet Take 0.6 mg by mouth 2 (two) times daily as needed (GOUT).   Marland Kitchen diclofenac Sodium (VOLTAREN) 1 % GEL Apply 4 g topically 4 (four) times daily.  . digoxin (LANOXIN) 0.125 MG tablet TAKE 1 TABLET BY MOUTH DAILY  . ELIQUIS 5 MG TABS tablet TAKE 1 TABLET BY MOUTH TWICE A DAY  . enalapril (VASOTEC) 5 MG tablet TAKE 1 TABLET BY MOUTH EVERYDAY AT BEDTIME  . fluticasone (FLONASE) 50 MCG/ACT nasal spray PLACE 1 SPRAY INTO BOTH NOSTRILS DAILY.  . furosemide (LASIX) 40 MG tablet TAKE 1 TABLET BY MOUITH DAILY AS NEEDED FOR EDEMA FOR FLUID OVERLOAD  . KLOR-CON M20 20 MEQ tablet TAKE 1 TABLET (20 MEQ TOTAL) BY MOUTH DAILY. WHEN TAKING FUROSEMIDE (LASIX). (Patient taking differently: Take 20 mEq by mouth daily as needed. When taking double  furosemide (Lasix).)  . metoprolol succinate (TOPROL-XL) 50 MG 24 hr tablet TAKE 1 TABLET (50 MG TOTAL) BY MOUTH DAILY. TAKE WITH OR IMMEDIATELY FOLLOWING A MEAL.  . Multiple Vitamin (MULITIVITAMIN WITH MINERALS)  TABS Take 1 tablet by mouth daily. Centrum Silver  . nystatin cream (MYCOSTATIN) Apply 1 application topically 2 (two) times daily as needed for dry skin (under breasts).  . ONETOUCH VERIO test strip USE AS DIRECTED TO CHECK BLOOD SUGARS 1 TIME PER DAY DX: E11.22  . pantoprazole (PROTONIX) 20 MG tablet Take 20 mg by mouth daily as needed for heartburn.   . Pitavastatin Calcium (LIVALO) 2 MG TABS Take 1 tablet (2 mg total)  by mouth daily.  . Polyethyl Glycol-Propyl Glycol (SYSTANE ULTRA) 0.4-0.3 % SOLN Place 1 drop into both eyes daily as needed (dry eyes).  . traMADol (ULTRAM) 50 MG tablet Take 1 tablet (50 mg total) by mouth every 12 (twelve) hours as needed.  . VICTOZA 18 MG/3ML SOPN INJECT 1.2 MG UNDER THE SKIN ONCE DAILY  . vitamin C (ASCORBIC ACID) 500 MG tablet Take 500 mg by mouth daily.   No facility-administered encounter medications on file as of 08/25/2020.    Patient Active Problem List   Diagnosis Date Noted  . Atrial tachycardia (HCC) 09/30/2019  . Syncope and collapse 07/26/2019  . Essential hypertension 11/12/2016  . Belching 08/26/2014  . Left bundle branch block (LBBB) 06/06/2014  . Lower GI bleeding 05/16/2013  . Lower GI bleed 05/16/2013  . Tobacco abuse   . Furunculosis of multiple sites 08/19/2011  . Rash, skin 08/19/2011  . DM (diabetes mellitus) type II uncontrolled with renal manifestation 08/17/2011  . Dehydration 08/17/2011  . Acute kidney injury (HCC) 08/17/2011  . CKD (chronic kidney disease) 08/17/2011  . NICM (nonischemic cardiomyopathy) (HCC)   . Mitral regurgitation   . Paroxysmal atrial fibrillation (HCC)   . Tricuspid regurgitation   . Thyroid dysfunction   . LFT elevation   . Long term current use of anticoagulant 07/16/2010  . HYPERKALEMIA 08/15/2009  . OVERWEIGHT/OBESITY 08/15/2009  . DM (diabetes mellitus) (HCC) 10/02/2007  . Pure hypercholesterolemia 10/02/2007  . DEPRESSION 10/02/2007  . BRONCHITIS 10/02/2007  . EMPHYSEMA 10/02/2007  . GERD 10/02/2007  . BREAST BIOPSY, HX OF 10/02/2007    Conditions to be addressed/monitored:HTN, HLD, DMII and CKD Stage 3  Care Plan : Chronic Disease Management and Care Coordination Needs  Updates made by Gilboy, Alisa G, RN since 08/25/2020 12:00 AM    Problem: Chronic Disease Management/Care Coordination   Priority: Medium    Long-Range Goal: Disease Progression Minimized or Managed   Start Date: 08/25/2020   Expected End Date: 11/23/2020  This Visit's Progress: On track  Priority: Medium  Note:   Current Barriers:  . Chronic Disease Management support, education, and care coordination needs related to HTN, HLD, DM, and CKD Stage 3 . Unable to independently self manage chronic health conditions Case Manager Clinical Goal(s):  . patient will work with BSW to address needs related to Limited education about disease management in patient with HTN, HLD, DM, and CKD Stage 3 Interventions:  . Collaborated with BSW to initiate plan of care to address needs related to Limited education about disease management in patient with HLD, DM, and CKD Stage 3 . Collaboration with Sanders, Robyn, MD regarding development and update of comprehensive plan of care as evidenced by provider attestation and co-signature . Inter-disciplinary care team collaboration (see longitudinal plan of care) Patient Goals/Self-Care Activities . patient will:   - Patient will work with BSW to address care coordination needs and will continue to work with the clinical team to address health care and disease management related needs.   Follow Up Plan:   The care management team will reach out to the patient again over the next 30 days.       Plan:The care management team will reach out to the patient again over the next 30 days.  [place] Management Coordinator Direct Dial:  [phone]  Fax: [phone]

## 2020-08-25 NOTE — Patient Instructions (Addendum)
Visit Information  Consent to CCM Services: Ms. Helbig was given information about Chronic Care Management services today including:  1. CCM service includes personalized support from designated clinical staff supervised by her physician, including individualized plan of care and coordination with other care providers 2. 24/7 contact phone numbers for assistance for urgent and routine care needs. 3. Service will only be billed when office clinical staff spend 20 minutes or more in a month to coordinate care. 4. Only one practitioner may furnish and bill the service in a calendar month. 5. The patient may stop CCM services at any time (effective at the end of the month) by phone call to the office staff. 6. The patient will be responsible for cost sharing (co-pay) of up to 20% of the service fee (after annual deductible is met).  Patient agreed to services and verbal consent obtained.   Patient verbalizes understanding of instructions provided today and agrees to view in [place].   The care management team will reach out to the patient again over the next 30 days.   [place] Management Coordinator Direct Dial:  [phone]  Fax: [phone]  CLINICAL CARE PLAN: Patient Care Plan: Chronic Disease Management and Care Coordination Needs    Problem Identified: Chronic Disease Management/Care Coordination   Priority: Medium    Long-Range Goal: Disease Progression Minimized or Managed   Start Date: 08/25/2020  Expected End Date: 11/23/2020  This Visit's Progress: On track  Priority: Medium  Note:   Current Barriers:  . Chronic Disease Management support, education, and care coordination needs related to HTN, HLD, DM, and CKD Stage 3 . Unable to independently self manage chronic health conditions Case Manager Clinical Goal(s):  . patient will work with BSW to address needs related to Limited education about disease  management in patient with HTN, HLD, DM, and CKD Stage 3 Interventions:  . Collaborated with BSW to initiate plan of care to address needs related to Limited education about disease management in patient with HLD, DM, and CKD Stage 3 . Collaboration with [name], MD regarding development and update of comprehensive plan of care as evidenced by provider attestation and co-signature . Inter-disciplinary care team collaboration (see longitudinal plan of care) Patient Goals/Self-Care Activities . patient will:   - Patient will work with BSW to address care coordination needs and will continue to work with the clinical team to address health care and disease management related needs.   Follow Up Plan: The care management team will reach out to the patient again over the next 30 days.

## 2020-08-25 NOTE — Chronic Care Management (AMB) (Signed)
Chronic Care Management    Social Work Note  08/25/2020 Name: [name] MRN: [id number] DOB: 07-Sep-1945  [name] is a 75 y.o. year old female who is a primary care patient of [name], MD. The CCM team was consulted to assist the patient with chronic disease management and/or care coordination needs related to: [employer] .   Engaged with patient by telephone for initial visit in response to provider referral for social work chronic care management and care coordination services.   Consent to Services:  The patient was given the following information about Chronic Care Management services today, agreed to services, and gave verbal consent: 1. CCM service includes personalized support from designated clinical staff supervised by the primary care provider, including individualized plan of care and coordination with other care providers 2. 24/7 contact phone numbers for assistance for urgent and routine care needs. 3. Service will only be billed when office clinical staff spend 20 minutes or more in a month to coordinate care. 4. Only one practitioner may furnish and bill the service in a calendar month. 5.The patient may stop CCM services at any time (effective at the end of the month) by phone call to the office staff. 6. The patient will be responsible for cost sharing (co-pay) of up to 20% of the service fee (after annual deductible is met). Patient agreed to services and consent obtained.  Patient agreed to services and consent obtained.   Assessment: Review of patient past medical history, allergies, medications, and health status, including review of relevant consultants reports was performed today as part of a comprehensive evaluation and provision of chronic care management and care coordination services.     SDOH (Social Determinants of Health) assessments and interventions performed:  SDOH Interventions   Flowsheet Row Most Recent Value  SDOH Interventions    Food Insecurity Interventions Intervention Not Indicated  Housing Interventions Other (Comment)  [Referred to [place]  Transportation Interventions Intervention Not Indicated       Advanced Directives Status: Not addressed in this encounter.  CCM Care Plan  Allergies  Allergen Reactions  . Codeine Hives and Nausea And Vomiting  . Augmentin [Amoxicillin-Pot Clavulanate] Diarrhea  . Coreg [Carvedilol]     No energy  . Warfarin And Related     Severe rectal bleeding    Outpatient Encounter Medications as of 08/25/2020  Medication Sig  . acetaminophen (TYLENOL) 500 MG tablet Take 1,000 mg by mouth 3 (three) times daily as needed for pain.  . BD PEN NEEDLE NANO U/F 32G X 4 MM MISC USE AS DIRECTED WITH INSULIN PENS  . betamethasone dipropionate 0.05 % cream Apply 1 application topically 2 (two) times daily as needed (dry skin).  . Cholecalciferol (VITAMIN D) 2000 UNITS CAPS Take 2,000 Units by mouth daily.  . colchicine 0.6 MG tablet Take 0.6 mg by mouth 2 (two) times daily as needed (GOUT).   Marland Kitchen diclofenac Sodium (VOLTAREN) 1 % GEL Apply 4 g topically 4 (four) times daily.  . digoxin (LANOXIN) 0.125 MG tablet TAKE 1 TABLET BY MOUTH DAILY  . ELIQUIS 5 MG TABS tablet TAKE 1 TABLET BY MOUTH TWICE A DAY  . enalapril (VASOTEC) 5 MG tablet TAKE 1 TABLET BY MOUTH EVERYDAY AT BEDTIME  . fluticasone (FLONASE) 50 MCG/ACT nasal spray PLACE 1 SPRAY INTO BOTH NOSTRILS DAILY.  . furosemide (LASIX) 40 MG tablet TAKE 1 TABLET BY MOUITH DAILY AS NEEDED FOR EDEMA FOR FLUID OVERLOAD  . KLOR-CON M20 20 MEQ tablet  TAKE 1 TABLET (20 MEQ TOTAL) BY MOUTH DAILY. WHEN TAKING FUROSEMIDE (LASIX). (Patient taking differently: Take 20 mEq by mouth daily as needed. When taking double  furosemide (Lasix).)  . metoprolol succinate (TOPROL-XL) 50 MG 24 hr tablet TAKE 1 TABLET (50 MG TOTAL) BY MOUTH DAILY. TAKE WITH OR IMMEDIATELY FOLLOWING A MEAL.  . Multiple Vitamin (MULITIVITAMIN WITH MINERALS) TABS Take 1 tablet by  mouth daily. Centrum Silver  . nystatin cream (MYCOSTATIN) Apply 1 application topically 2 (two) times daily as needed for dry skin (under breasts).  [name] VERIO test strip USE AS DIRECTED TO CHECK BLOOD SUGARS 1 TIME PER DAY DX: E11.22  . pantoprazole (PROTONIX) 20 MG tablet Take 20 mg by mouth daily as needed for heartburn.   . Pitavastatin Calcium (LIVALO) 2 MG TABS Take 1 tablet (2 mg total) by mouth daily.  Vladimir Faster Glycol-Propyl Glycol (SYSTANE ULTRA) 0.4-0.3 % SOLN Place 1 drop into both eyes daily as needed (dry eyes).  . traMADol (ULTRAM) 50 MG tablet Take 1 tablet (50 mg total) by mouth every 12 (twelve) hours as needed.  Marland Kitchen VICTOZA 18 MG/3ML SOPN INJECT 1.2 MG UNDER THE SKIN ONCE DAILY  . vitamin C (ASCORBIC ACID) 500 MG tablet Take 500 mg by mouth daily.   No facility-administered encounter medications on file as of 08/25/2020.    Patient Active Problem List   Diagnosis Date Noted  . Atrial tachycardia ([place]) 09/30/2019  . Syncope and collapse 07/26/2019  . Essential hypertension 11/12/2016  . Belching 08/26/2014  . Left bundle branch block (LBBB) 06/06/2014  . Lower GI bleeding 05/16/2013  . Lower GI bleed 05/16/2013  . Tobacco abuse   . Furunculosis of multiple sites 08/19/2011  . Rash, skin 08/19/2011  . DM (diabetes mellitus) type II uncontrolled with renal manifestation 08/17/2011  . Dehydration 08/17/2011  . Acute kidney injury ([place]) 08/17/2011  . CKD (chronic kidney disease) 08/17/2011  . NICM (nonischemic cardiomyopathy) ([place])   . Mitral regurgitation   . Paroxysmal atrial fibrillation (HCC)   . Tricuspid regurgitation   . Thyroid dysfunction   . LFT elevation   . Long term current use of anticoagulant 07/16/2010  . HYPERKALEMIA 08/15/2009  . OVERWEIGHT/OBESITY 08/15/2009  . DM (diabetes mellitus) ([place]) 10/02/2007  . Pure hypercholesterolemia 10/02/2007  . DEPRESSION 10/02/2007  . BRONCHITIS 10/02/2007  . EMPHYSEMA 10/02/2007  . GERD 10/02/2007  .  BREAST BIOPSY, HX OF 10/02/2007    Conditions to be addressed/monitored: HTN, DMII and CKD Stage III; Financial constraints related to costs of living  Care Plan : Social Work [place]  Updates made by Daneen Schick since 08/25/2020 12:00 AM    Problem: Quality of Life (General Plan of Care)     Long-Range Goal: Quality of Life Maintained   Start Date: 08/25/2020  Expected End Date: 11/23/2020  Priority: Medium  Note:   Current Barriers:  . Chronic disease management support and education needs related to HTN, DM, and CKD Stage III   . Financial constraints related to costs of living  Education officer, museum Clinical Goal(s):  Marland Kitchen Over the next 90 days, patient will work with SW to identify and address any acute and/or chronic care coordination needs related to the self health management of HTN, DM, and CKD Stage III   . Over the next 10 days the patient will contact DSS to apply for LIEAP  CCM SW Interventions:  . Inter-disciplinary care team collaboration (see longitudinal plan of care) . Collaboration with [name],  MD regarding development and update of comprehensive plan of care as evidenced by provider attestation and co-signature . Successful outbound call placed to the patient to conduct an SDoH screen . Discussed the patient lives alone in her home and continues to drive herself. The patient does receive food stamps which helps cover the cost of food each month. The patient does not express difficulty affording medications at this time . Determined the patient does experience difficulty affording electric bill during the winter due to increased costs from heating - the patient is currently behind $300 . Provided verbal education on [place] (LIEAP) . Accessed LIEAP site which indicates application window is open until 3/31 if funds have not yet been exhausted . Provided the patient with the contact number to [place] to complete  eligibility screening for assistance under LIEAP . Discussed planned follow up with SW for care coordination needs while patient engages with  RN Case Manager  to address care management needs . Scheduled follow up call over the next 21 days . Collaboration with RN Care Manager regarding interventions and plan  Patient Goals/Self-Care Activities . Over the next 21 days, patient will:   - Patient will self administer medications as prescribed Patient will call provider office for new concerns or questions Contact DSS to apply for [place] as needed prior to next scheduled call  Follow Up Plan:  SW will follow up with the patient over the next 21 days       Follow Up Plan: SW will follow up with patient by phone over the next 21 days.      Daneen Schick, BSW, CDP Social Worker, Certified Dementia Practitioner [place] [facility] [place] Management [phone]  Total time spent performing care coordination and/or care management activities with the patient by phone or face to face = 48 minutes.

## 2020-08-28 ENCOUNTER — Telehealth: Payer: Self-pay

## 2020-08-28 ENCOUNTER — Other Ambulatory Visit: Payer: Self-pay | Admitting: Gastroenterology

## 2020-08-28 DIAGNOSIS — Z1211 Encounter for screening for malignant neoplasm of colon: Secondary | ICD-10-CM | POA: Diagnosis not present

## 2020-08-28 DIAGNOSIS — I482 Chronic atrial fibrillation, unspecified: Secondary | ICD-10-CM | POA: Diagnosis not present

## 2020-08-28 DIAGNOSIS — R1013 Epigastric pain: Secondary | ICD-10-CM | POA: Diagnosis not present

## 2020-08-28 NOTE — Telephone Encounter (Signed)
   [facility] Pre-operative Risk Assessment     Request for surgical clearance:  1. What type of surgery is being performed? Colonoscopy and Endoscopic Ultrasound  2. When is this surgery scheduled? 09/14/20   3. What type of clearance is required (medical clearance vs. Pharmacy clearance to hold med vs. Both)? Both   4. Are there any medications that need to be held prior to surgery and how long? Eliquis   5. Practice name and name of physician performing surgery?  [facility] PA, Dr. Carol Ada    6. What is the office phone number? [phone]   7.   What is the office fax number? [phone]  8.   Anesthesia type (None, local, MAC, general) ?  Propofol

## 2020-08-29 ENCOUNTER — Telehealth: Payer: Medicare Other

## 2020-08-29 NOTE — Telephone Encounter (Signed)
Patient with diagnosis of A Fib on Eliquis for anticoagulation.    Procedure: Colonoscopy and Endoscopic Ultrasound  Date of procedure: 09/14/20  CHA2DS2-VASc Score = 7  This indicates a 11.2% annual risk of stroke. The patient's score is based upon: CHF History: Yes HTN History: Yes Diabetes History: Yes Stroke History: No Vascular Disease History: Yes Age Score: 2 Gender Score: 1   CrCl 45 mL/min Platelet count 266K  Patient will be high risk off anticoagulation.  Will route to MD for input.

## 2020-09-01 ENCOUNTER — Telehealth: Payer: Medicare Other

## 2020-09-03 ENCOUNTER — Other Ambulatory Visit: Payer: Self-pay | Admitting: Cardiovascular Disease

## 2020-09-04 NOTE — Telephone Encounter (Signed)
Hold Eliquis for 2 days prior.

## 2020-09-04 NOTE — Telephone Encounter (Signed)
Left message to call back and ask to speak with pre-op team.  Darreld Mclean, PA-C 09/04/2020 2:30 PM

## 2020-09-05 NOTE — Telephone Encounter (Signed)
   Patient Name: [name]  DOB: [birth year]  MRN: [id number]   Primary Cardiologist: [name], MD  Chart reviewed as part of pre-operative protocol coverage. Patient was last seen by Dr. [name] in 05/2020 at which time she was doing well from a cardiac standpoint. Patient was contacted today for further evaluation and reports doing well since her last visit. She denies any chest pain, shortness of breath, orthopnea, PND, palpitations, near syncope/syncope. She is able to complete >4.0 METS without any angina. Per Revised Cardiac Risk Index, considered low risk with 0.9% change of adverse cardiac event given history of CHF. Given past medical history and time since last visit, based on ACC/AHA guidelines, [name] would be at acceptable risk for the planned procedure without further cardiovascular testing.   Per Dr. [name], [name] to hold Eliquis for 2 days prior to procedure. Please restart this as soon as able following procedure.  I will route this recommendation to the requesting party via Epic fax function and remove from pre-op pool.  Please call with questions.  Darreld Mclean, PA-C 09/05/2020, 2:25 PM

## 2020-09-06 ENCOUNTER — Telehealth: Payer: Medicare Other

## 2020-09-06 ENCOUNTER — Telehealth: Payer: Self-pay

## 2020-09-06 NOTE — Telephone Encounter (Cosign Needed)
  Chronic Care Management   Outreach Note  09/06/2020 Name: [name] MRN: [id number] DOB: [birth year]  Referred by: [name], MD Reason for referral : Chronic Care Management (Initial RN CM Call Attempt )   An unsuccessful telephone outreach was attempted today. The patient was referred to the case management team for assistance with care management and care coordination.   Follow Up Plan: The care management team will reach out to the patient again over the next 30-45 days.    Barb Merino, RN, BSN, CCM Care Management Coordinator [place] [facility]  Direct Phone: [phone]

## 2020-09-12 ENCOUNTER — Ambulatory Visit (INDEPENDENT_AMBULATORY_CARE_PROVIDER_SITE_OTHER): Payer: Medicare Other

## 2020-09-12 DIAGNOSIS — I130 Hypertensive heart and chronic kidney disease with heart failure and stage 1 through stage 4 chronic kidney disease, or unspecified chronic kidney disease: Secondary | ICD-10-CM | POA: Diagnosis not present

## 2020-09-12 DIAGNOSIS — N183 Chronic kidney disease, stage 3 unspecified: Secondary | ICD-10-CM

## 2020-09-12 DIAGNOSIS — E1122 Type 2 diabetes mellitus with diabetic chronic kidney disease: Secondary | ICD-10-CM | POA: Diagnosis not present

## 2020-09-12 DIAGNOSIS — Z794 Long term (current) use of insulin: Secondary | ICD-10-CM | POA: Diagnosis not present

## 2020-09-12 NOTE — Patient Instructions (Signed)
Social Worker Visit Information  Goals we discussed today:  Goals Addressed            This Visit's Progress   . Quality of Life Maintained       Timeframe:  Long-Range Goal Priority:  Medium Start Date: 3.25.22                            Expected End Date: 6.23.22                      Next planned outreach: 6.28.22  Patient Goals/Self-Care Activities . Over the next 21 days, patient will:   - Patient will self administer medications as prescribed Patient will call provider office for new concerns or questions Contact SW as needed prior to next scheduled call        Materials Provided: Verbal education about resources provided by phone  Follow Up Plan: SW will follow up with patient by phone over the next 90 days.   Daneen Schick, BSW, CDP Social Worker, Certified Dementia Practitioner [place] / [place] Management [phone]

## 2020-09-12 NOTE — Chronic Care Management (AMB) (Signed)
Chronic Care Management    Social Work Note  09/12/2020 Name: [name] MRN: [id number] DOB: 1945/08/28  [name] is a 75 y.o. year old female who is a primary care patient of [name], MD. The CCM team was consulted to assist the patient with chronic disease management and/or care coordination needs related to: [employer] .   Engaged with patient by telephone for follow up visit in response to provider referral for social work chronic care management and care coordination services.   Consent to Services:  The patient was given information about Chronic Care Management services, agreed to services, and gave verbal consent prior to initiation of services.  Please see initial visit note for detailed documentation.   Patient agreed to services and consent obtained.   Assessment: Review of patient past medical history, allergies, medications, and health status, including review of relevant consultants reports was performed today as part of a comprehensive evaluation and provision of chronic care management and care coordination services.     SDOH (Social Determinants of Health) assessments and interventions performed:    Advanced Directives Status: Not addressed in this encounter.  CCM Care Plan  Allergies  Allergen Reactions  . Codeine Hives and Nausea And Vomiting  . Augmentin [Amoxicillin-Pot Clavulanate] Diarrhea  . Coreg [Carvedilol]     No energy  . Warfarin And Related     Severe rectal bleeding    Outpatient Encounter Medications as of 09/12/2020  Medication Sig  . acetaminophen (TYLENOL) 500 MG tablet Take 1,000 mg by mouth 3 (three) times daily as needed for pain.  . BD PEN NEEDLE NANO U/F 32G X 4 MM MISC USE AS DIRECTED WITH INSULIN PENS  . betamethasone dipropionate 0.05 % cream Apply 1 application topically 2 (two) times daily as needed (dry skin).  . Cholecalciferol (VITAMIN D) 2000 UNITS CAPS Take 2,000 Units by mouth daily.  . colchicine  0.6 MG tablet Take 0.6 mg by mouth 2 (two) times daily as needed (GOUT).   Marland Kitchen diclofenac Sodium (VOLTAREN) 1 % GEL Apply 4 g topically 4 (four) times daily.  . digoxin (LANOXIN) 0.125 MG tablet TAKE 1 TABLET BY MOUTH DAILY  . ELIQUIS 5 MG TABS tablet TAKE 1 TABLET BY MOUTH TWICE A DAY  . enalapril (VASOTEC) 5 MG tablet TAKE 1 TABLET BY MOUTH EVERYDAY AT BEDTIME  . fluticasone (FLONASE) 50 MCG/ACT nasal spray PLACE 1 SPRAY INTO BOTH NOSTRILS DAILY.  . furosemide (LASIX) 40 MG tablet TAKE 1 TABLET BY MOUITH DAILY AS NEEDED FOR EDEMA FOR FLUID OVERLOAD  . KLOR-CON M20 20 MEQ tablet TAKE 1 TABLET (20 MEQ TOTAL) BY MOUTH DAILY. WHEN TAKING FUROSEMIDE (LASIX). (Patient taking differently: Take 20 mEq by mouth daily as needed. When taking double  furosemide (Lasix).)  . metoprolol succinate (TOPROL-XL) 50 MG 24 hr tablet TAKE 1 TABLET (50 MG TOTAL) BY MOUTH DAILY. TAKE WITH OR IMMEDIATELY FOLLOWING A MEAL.  . Multiple Vitamin (MULITIVITAMIN WITH MINERALS) TABS Take 1 tablet by mouth daily. Centrum Silver  . nystatin cream (MYCOSTATIN) Apply 1 application topically 2 (two) times daily as needed for dry skin (under breasts).  [name] VERIO test strip USE AS DIRECTED TO CHECK BLOOD SUGARS 1 TIME PER DAY DX: E11.22  . pantoprazole (PROTONIX) 20 MG tablet Take 20 mg by mouth daily as needed for heartburn.   . Pitavastatin Calcium (LIVALO) 2 MG TABS Take 1 tablet (2 mg total) by mouth daily.  Vladimir Faster Glycol-Propyl Glycol (SYSTANE ULTRA) 0.4-0.3 %  SOLN Place 1 drop into both eyes daily as needed (dry eyes).  . traMADol (ULTRAM) 50 MG tablet Take 1 tablet (50 mg total) by mouth every 12 (twelve) hours as needed.  Marland Kitchen VICTOZA 18 MG/3ML SOPN INJECT 1.2 MG UNDER THE SKIN ONCE DAILY  . vitamin C (ASCORBIC ACID) 500 MG tablet Take 500 mg by mouth daily.   No facility-administered encounter medications on file as of 09/12/2020.    Patient Active Problem List   Diagnosis Date Noted  . Atrial tachycardia ([place])  09/30/2019  . Syncope and collapse 07/26/2019  . Essential hypertension 11/12/2016  . Belching 08/26/2014  . Left bundle branch block (LBBB) 06/06/2014  . Lower GI bleeding 05/16/2013  . Lower GI bleed 05/16/2013  . Tobacco abuse   . Furunculosis of multiple sites 08/19/2011  . Rash, skin 08/19/2011  . DM (diabetes mellitus) type II uncontrolled with renal manifestation 08/17/2011  . Dehydration 08/17/2011  . Acute kidney injury ([place]) 08/17/2011  . CKD (chronic kidney disease) 08/17/2011  . NICM (nonischemic cardiomyopathy) ([place])   . Mitral regurgitation   . Paroxysmal atrial fibrillation (HCC)   . Tricuspid regurgitation   . Thyroid dysfunction   . LFT elevation   . Long term current use of anticoagulant 07/16/2010  . HYPERKALEMIA 08/15/2009  . OVERWEIGHT/OBESITY 08/15/2009  . DM (diabetes mellitus) ([place]) 10/02/2007  . Pure hypercholesterolemia 10/02/2007  . DEPRESSION 10/02/2007  . BRONCHITIS 10/02/2007  . EMPHYSEMA 10/02/2007  . GERD 10/02/2007  . BREAST BIOPSY, HX OF 10/02/2007    Conditions to be addressed/monitored: HTN, DMII and CKD Stage III; Financial constraints related to costs of living  Care Plan : Social Work [place]  Updates made by Daneen Schick since 09/12/2020 12:00 AM    Problem: Quality of Life (General Plan of Care)     Long-Range Goal: Quality of Life Maintained   Start Date: 08/25/2020  Expected End Date: 11/23/2020  Priority: Medium  Note:   Current Barriers:  . Chronic disease management support and education needs related to HTN, DM, and CKD Stage III   . Financial constraints related to costs of living  Education officer, museum Clinical Goal(s):  Marland Kitchen Over the next 90 days, patient will work with SW to identify and address any acute and/or chronic care coordination needs related to the self health management of HTN, DM, and CKD Stage III   . Over the next 10 days the patient will contact DSS to apply for LIEAP  Goal Met  CCM SW Interventions:   . Inter-disciplinary care team collaboration (see longitudinal plan of care) . Collaboration with [name], MD regarding development and update of comprehensive plan of care as evidenced by provider attestation and co-signature . Successful outbound call placed to the patient to assess goal progression . Determined the patient did contact DSS to apply for [place] program to provide needed information to a caseworker and was informed a supervisor would call her back . Patient reports she waited a few days without a return call so she called back on 3.30.22 - patient was informed she must apply either online or in person . Discussed the patient attempted to complete the online application but was unsuccessful due to the application freezing . Patient reports she went ahead and paid off her electric bill - patient plans to cut back on other spending this month to make up the difference . Discussed the patient has an endoscopy scheduled for 4.29 and will need to complete a covid test prior  to procedure . Assisted the patient in mapping out driving route to assigned testing location . Assessed for other resource needs - no acute challenges identified at this time . Scheduled follow up call over the next 90 days  Patient Goals/Self-Care Activities . Over the next 90 days, patient will:   - Patient will self administer medications as prescribed Patient will call provider office for new concerns or questions Contact SW as needed prior to next scheduled call   Follow Up Plan:  SW will follow up with the patient over the next 90 days       Follow Up Plan: SW will follow up with patient by phone over the next 90 days.      Daneen Schick, BSW, CDP Social Worker, Certified Dementia Practitioner [place] / [place] [facility] [phone]  Total time spent performing care coordination and/or care management activities with the patient by phone or face to face = 41 minutes.

## 2020-09-14 ENCOUNTER — Encounter: Payer: Self-pay | Admitting: Internal Medicine

## 2020-09-14 DIAGNOSIS — D123 Benign neoplasm of transverse colon: Secondary | ICD-10-CM | POA: Diagnosis not present

## 2020-09-14 DIAGNOSIS — D179 Benign lipomatous neoplasm, unspecified: Secondary | ICD-10-CM | POA: Diagnosis not present

## 2020-09-14 DIAGNOSIS — K635 Polyp of colon: Secondary | ICD-10-CM | POA: Diagnosis not present

## 2020-09-14 DIAGNOSIS — K5730 Diverticulosis of large intestine without perforation or abscess without bleeding: Secondary | ICD-10-CM | POA: Diagnosis not present

## 2020-09-14 DIAGNOSIS — Z1211 Encounter for screening for malignant neoplasm of colon: Secondary | ICD-10-CM | POA: Diagnosis not present

## 2020-09-14 LAB — HM COLONOSCOPY

## 2020-09-25 ENCOUNTER — Encounter (HOSPITAL_COMMUNITY): Payer: Self-pay | Admitting: Gastroenterology

## 2020-09-25 ENCOUNTER — Other Ambulatory Visit: Payer: Self-pay

## 2020-09-26 ENCOUNTER — Other Ambulatory Visit (HOSPITAL_COMMUNITY)
Admission: RE | Admit: 2020-09-26 | Discharge: 2020-09-26 | Disposition: A | Discharge status: Home / Self Care | Payer: Medicare Other | Source: Ambulatory Visit | Attending: Gastroenterology | Admitting: Gastroenterology

## 2020-09-26 DIAGNOSIS — Z20822 Contact with and (suspected) exposure to covid-19: Secondary | ICD-10-CM | POA: Insufficient documentation

## 2020-09-26 DIAGNOSIS — Z01812 Encounter for preprocedural laboratory examination: Secondary | ICD-10-CM | POA: Diagnosis not present

## 2020-09-27 LAB — SARS CORONAVIRUS 2 (TAT 6-24 HRS): SARS Coronavirus 2: NEGATIVE

## 2020-09-29 ENCOUNTER — Encounter (HOSPITAL_COMMUNITY): Payer: Self-pay | Admitting: Gastroenterology

## 2020-09-29 ENCOUNTER — Ambulatory Visit (HOSPITAL_COMMUNITY): Payer: Medicare Other | Admitting: Anesthesiology

## 2020-09-29 ENCOUNTER — Ambulatory Visit (HOSPITAL_COMMUNITY)
Admission: RE | Admit: 2020-09-29 | Discharge: 2020-09-29 | Disposition: A | Discharge status: Home / Self Care | Payer: Medicare Other | Attending: Gastroenterology | Admitting: Gastroenterology

## 2020-09-29 ENCOUNTER — Encounter (HOSPITAL_COMMUNITY)
Admission: RE | Disposition: A | Discharge status: Home / Self Care | Payer: Self-pay | Source: Home / Self Care | Attending: Gastroenterology

## 2020-09-29 ENCOUNTER — Other Ambulatory Visit: Payer: Self-pay

## 2020-09-29 DIAGNOSIS — Z888 Allergy status to other drugs, medicaments and biological substances status: Secondary | ICD-10-CM | POA: Diagnosis not present

## 2020-09-29 DIAGNOSIS — Z96652 Presence of left artificial knee joint: Secondary | ICD-10-CM | POA: Insufficient documentation

## 2020-09-29 DIAGNOSIS — K319 Disease of stomach and duodenum, unspecified: Secondary | ICD-10-CM | POA: Insufficient documentation

## 2020-09-29 DIAGNOSIS — Z800 Family history of malignant neoplasm of digestive organs: Secondary | ICD-10-CM | POA: Insufficient documentation

## 2020-09-29 DIAGNOSIS — I130 Hypertensive heart and chronic kidney disease with heart failure and stage 1 through stage 4 chronic kidney disease, or unspecified chronic kidney disease: Secondary | ICD-10-CM | POA: Diagnosis not present

## 2020-09-29 DIAGNOSIS — Z885 Allergy status to narcotic agent status: Secondary | ICD-10-CM | POA: Insufficient documentation

## 2020-09-29 DIAGNOSIS — Z7901 Long term (current) use of anticoagulants: Secondary | ICD-10-CM | POA: Insufficient documentation

## 2020-09-29 DIAGNOSIS — Z9049 Acquired absence of other specified parts of digestive tract: Secondary | ICD-10-CM | POA: Insufficient documentation

## 2020-09-29 DIAGNOSIS — K254 Chronic or unspecified gastric ulcer with hemorrhage: Secondary | ICD-10-CM | POA: Diagnosis not present

## 2020-09-29 DIAGNOSIS — Z880 Allergy status to penicillin: Secondary | ICD-10-CM | POA: Insufficient documentation

## 2020-09-29 DIAGNOSIS — I4891 Unspecified atrial fibrillation: Secondary | ICD-10-CM | POA: Diagnosis not present

## 2020-09-29 DIAGNOSIS — R1013 Epigastric pain: Secondary | ICD-10-CM | POA: Diagnosis not present

## 2020-09-29 DIAGNOSIS — N189 Chronic kidney disease, unspecified: Secondary | ICD-10-CM | POA: Diagnosis not present

## 2020-09-29 DIAGNOSIS — K3189 Other diseases of stomach and duodenum: Secondary | ICD-10-CM | POA: Diagnosis not present

## 2020-09-29 DIAGNOSIS — Z881 Allergy status to other antibiotic agents status: Secondary | ICD-10-CM | POA: Insufficient documentation

## 2020-09-29 DIAGNOSIS — F17210 Nicotine dependence, cigarettes, uncomplicated: Secondary | ICD-10-CM | POA: Diagnosis not present

## 2020-09-29 DIAGNOSIS — K259 Gastric ulcer, unspecified as acute or chronic, without hemorrhage or perforation: Secondary | ICD-10-CM | POA: Insufficient documentation

## 2020-09-29 DIAGNOSIS — I509 Heart failure, unspecified: Secondary | ICD-10-CM | POA: Diagnosis not present

## 2020-09-29 DIAGNOSIS — E1122 Type 2 diabetes mellitus with diabetic chronic kidney disease: Secondary | ICD-10-CM | POA: Diagnosis not present

## 2020-09-29 HISTORY — PX: ESOPHAGOGASTRODUODENOSCOPY (EGD) WITH PROPOFOL: SHX5813

## 2020-09-29 HISTORY — PX: BIOPSY: SHX5522

## 2020-09-29 HISTORY — PX: UPPER ESOPHAGEAL ENDOSCOPIC ULTRASOUND (EUS): SHX6562

## 2020-09-29 LAB — GLUCOSE, CAPILLARY: Glucose-Capillary: 124 mg/dL — ABNORMAL HIGH (ref 70–99)

## 2020-09-29 SURGERY — UPPER ESOPHAGEAL ENDOSCOPIC ULTRASOUND (EUS)
Anesthesia: Monitor Anesthesia Care

## 2020-09-29 MED ORDER — PROPOFOL 1000 MG/100ML IV EMUL
INTRAVENOUS | Status: AC
  Filled 2020-09-29: qty 100

## 2020-09-29 MED ORDER — SODIUM CHLORIDE 0.9 % IV SOLN: INTRAVENOUS | Status: DC

## 2020-09-29 MED ORDER — PROPOFOL 500 MG/50ML IV EMUL
INTRAVENOUS | Status: DC | PRN
  Administered 2020-09-29: 200 ug/kg/min via INTRAVENOUS

## 2020-09-29 MED ORDER — LACTATED RINGERS IV SOLN: Freq: Once | INTRAVENOUS | Status: AC

## 2020-09-29 MED ORDER — LIDOCAINE HCL (CARDIAC) PF 100 MG/5ML IV SOSY
PREFILLED_SYRINGE | INTRAVENOUS | Status: DC | PRN
  Administered 2020-09-29: 60 mg via INTRAVENOUS

## 2020-09-29 MED ORDER — PROPOFOL 10 MG/ML IV BOLUS
INTRAVENOUS | Status: DC | PRN
  Administered 2020-09-29 (×2): 10 mg via INTRAVENOUS

## 2020-09-29 NOTE — Transfer of Care (Signed)
Immediate Anesthesia Transfer of Care Note  Patient: [name]  Procedure(s) Performed: UPPER ESOPHAGEAL ENDOSCOPIC ULTRASOUND (EUS) (N/A ) BIOPSY  Patient Location: Endoscopy Unit  Anesthesia Type:MAC  Level of Consciousness: awake  Airway & Oxygen Therapy: Patient Spontanous Breathing and Patient connected to face mask oxygen  Post-op Assessment: Report given to RN and Post -op Vital signs reviewed and stable  Post vital signs: Reviewed and stable  Last Vitals:  Vitals Value Taken Time  BP    Temp    Pulse 63 09/29/20 1245  Resp 12 09/29/20 1245  SpO2 100 % 09/29/20 1245  Vitals shown include unvalidated device data.  Last Pain:  Vitals:   09/29/20 1122  TempSrc: Oral  PainSc: 0-No pain         Complications: No complications documented.

## 2020-09-29 NOTE — Op Note (Signed)
[facility] Patient Name: [name] Procedure Date: 09/29/2020 MRN: [id number] Attending MD: Carol Ada , MD Date of Birth: 10/06/1945 CSN: [id number] Age: 75 Admit Type: Outpatient Procedure:                Upper EUS Indications:              Epigastric abdominal pain Providers:                Carol Ada, MD, Kary Kos RN, RN, Dulcy Fanny, Faustina Mbumina, Technician Referring MD:              Medicines:                Propofol per Anesthesia Complications:            No immediate complications. Estimated Blood Loss:     Estimated blood loss: none. Procedure:                Pre-Anesthesia Assessment:                           - Prior to the procedure, a History and Physical                            was performed, and patient medications and                            allergies were reviewed. The patient's tolerance of                            previous anesthesia was also reviewed. The risks                            and benefits of the procedure and the sedation                            options and risks were discussed with the patient.                            All questions were answered, and informed consent                            was obtained. Prior Anticoagulants: The patient has                            taken Eliquis (apixaban), last dose was 2 days                            prior to procedure. ASA Grade Assessment: III - A                            patient with severe systemic disease. After  reviewing the risks and benefits, the patient was                            deemed in satisfactory condition to undergo the                            procedure.                           - Sedation was administered by an anesthesia                            professional. Deep sedation was attained.                           After obtaining informed consent, the endoscope was                             passed under direct vision. Throughout the                            procedure, the patient's blood pressure, pulse, and                            oxygen saturations were monitored continuously. The                            ([id number] Linear EUS was introduced                            through the mouth, and advanced to the second part                            of duodenum. The upper EUS was accomplished without                            difficulty. The patient tolerated the procedure                            well. Scope In: Scope Out: Findings:      ENDOSCOPIC FINDING: :      Two localized erosions with stigmata of recent bleeding were found in       the gastric body. Biopsies were taken with a cold forceps for histology.      ENDOSONOGRAPHIC FINDING: :      There was no sign of significant endosonographic abnormality in the       pancreatic head, genu of the pancreas, pancreatic body, pancreatic tail,       uncinate process of the pancreas and main pancreatic duct. The       pancreatic duct measured up to 3 mm in diameter. No masses, the       pancreatic duct was thin in caliber, the pancreatic duct was regular in       contour.      Evidence of a previous cholecystectomy was identified       endosonographically. Impression:               -  Gastric erosions with stigmata of recent                            bleeding. Biopsied.                           - There was no sign of significant pathology in the                            pancreatic head, genu of the pancreas, pancreatic                            body, pancreatic tail, uncinate process of the                            pancreas and main pancreatic duct.                           - Evidence of a cholecystectomy. Moderate Sedation:      Not Applicable - Patient had care per Anesthesia. Recommendation:           - Patient has a contact number available for                             emergencies. The signs and symptoms of potential                            delayed complications were discussed with the                            patient. Return to normal activities tomorrow.                            Written discharge instructions were provided to the                            patient.                           - Resume regular diet.                           - Await pathology results.                           - PPI QD. Procedure Code(s):        --- Professional ---                           [phone], Esophagogastroduodenoscopy, flexible,                            transoral; with endoscopic ultrasound examination                            limited to the esophagus, stomach or duodenum, and  adjacent structures                           43239, Esophagogastroduodenoscopy, flexible,                            transoral; with biopsy, single or multiple Diagnosis Code(s):        --- Professional ---                           R10.13, Epigastric pain                           K25.4, Chronic or unspecified gastric ulcer with                            hemorrhage                           Z90.49, Acquired absence of other specified parts                            of digestive tract CPT copyright 2019 American [employer] Association. All rights reserved. The codes documented in this report are preliminary and upon coder review may  be revised to meet current compliance requirements. Carol Ada, MD Carol Ada, MD 09/29/2020 12:55:23 PM This report has been signed electronically. Number of Addenda: 0

## 2020-09-29 NOTE — H&P (Signed)
[name] HPI: At this time the patient denies any problems with nausea, vomiting, fevers, chills, diarrhea, constipation, hematochezia, melena, GERD, or dysphagia. The patient denies any known family history of colon cancers. No complaints of chest pain, SOB, MI, or sleep apnea.  Her colonoscopy on 12/28/2009 was performed for heme positive stool.  Diverticula were identified.  She had a diverticular bleed in 2014.  Eliquis is used for her afib.  The patient has three brothers that died from pancreatic cancer.  She does mention some minor issues with upper abdominal discomfort.  Past Medical History:  Diagnosis Date  . Arthritis    "left knee" (03/12/2013)  . Atrial fibrillation ([place])    Cardioverted to NSR 08/22/2009  . Atrial tachycardia ([place]) 09/30/2019  . Cardiomyopathy   . CHF (congestive heart failure) ([place])   . CKD (chronic kidney disease)   . Depression   . Dizziness   . DM2 (diabetes mellitus, type 2) ([place])    fasting 80-100 (03/12/2013)  . Drug therapy 1/11   coumadin  . Ejection fraction < 50%    20-25%, echo.06/2009, /  EF 30-35%, diffuse hypokinesis, echo, Oct 16, 2010  . GERD (gastroesophageal reflux disease)   . Gout    "left knee" (03/12/2013)  . History of bronchitis   . History of emphysema   . HLD (hyperlipidemia)   . Hyperkalemia   . Hypertension   . LFT elevation    06/2009  . Mitral regurgitation    mild - echo - 1/11 /  Mild, echo, May15, 2012  . Overweight(278.02)   . Pneumonia    "@ least twice" (03/12/2013)  . Thyroid dysfunction    thyromegally,diffuse,nodule LLL,06/2009  . Tobacco abuse   . Tricuspid regurgitation    moderate - echo - 1/11    Past Surgical History:  Procedure Laterality Date  . BREAST BIOPSY Right 1980's  . BREAST LUMPECTOMY Right 1980's  . CARDIOVERSION N/A 10/15/2019   Procedure: CARDIOVERSION;  Surgeon: [name], MD;  Location: [facility];  Service: Cardiovascular;  Laterality: N/A;  . CATARACT EXTRACTION W/  INTRAOCULAR LENS  IMPLANT, BILATERAL Bilateral 2010-2012  . CHOLECYSTECTOMY  ~ 1978  . JOINT REPLACEMENT    . TONSILLECTOMY  1960's  . TOTAL KNEE ARTHROPLASTY Left 03/12/2013  . TOTAL KNEE ARTHROPLASTY Left 03/12/2013   Procedure: TOTAL KNEE ARTHROPLASTY;  Surgeon: Newt Minion, MD;  Location: [place];  Service: Orthopedics;  Laterality: Left;  Left total knee arthroplasty  . TUBAL LIGATION    . VAGINAL HYSTERECTOMY  1987    Family History  Problem Relation Age of Onset  . Stroke Mother   . Prostate cancer Father   . Colon cancer Other   . Heart disease Other   . Heart attack Son   . Hypertension Sister   . Hypertension Son        X20  . Stroke Maternal Grandfather   . Stroke Paternal Grandmother     Social History:  reports that she has been smoking cigarettes. She has a 20.00 pack-year smoking history. She has never used smokeless tobacco. She reports that she does not drink alcohol and does not use drugs.  Allergies:  Allergies  Allergen Reactions  . Codeine Hives and Nausea And Vomiting  . Augmentin [Amoxicillin-Pot Clavulanate] Diarrhea  . Coreg [Carvedilol]     No energy  . Warfarin And Related     Severe rectal bleeding    Medications: Scheduled: Continuous:  No results found for this or  any previous visit (from the past 24 hour(s)).   No results found.  ROS:  As stated above in the HPI otherwise negative.  Height 5\' 7"  (1.702 m), weight 100 kg.    PE: Gen: NAD, Alert and Oriented HEENT:  [place]/AT, EOMI Neck: Supple, no LAD Lungs: CTA Bilaterally CV: RRR without M/G/R ABD: Soft, NTND, +BS Ext: No C/C/E  Assessment/Plan: 1) Epigastric pain. 2) Significant family history of pancreatic cancer in her brothers.  Plan: 1) EUS.  Copelyn Widmer D 09/29/2020, 10:07 AM

## 2020-09-29 NOTE — Discharge Instructions (Signed)
YOU HAD AN ENDOSCOPIC PROCEDURE TODAY: Refer to the procedure report and other information in the discharge instructions given to you for any specific questions about what was found during the examination. If this information does not answer your questions, please call [facility] office at [phone] to clarify.   YOU SHOULD EXPECT: Some feelings of bloating in the abdomen. Passage of more gas than usual. Walking can help get rid of the air that was put into your GI tract during the procedure and reduce the bloating. If you had a lower endoscopy (such as a colonoscopy or flexible sigmoidoscopy) you may notice spotting of blood in your stool or on the toilet paper. Some abdominal soreness may be present for a day or two, also.  DIET: Your first meal following the procedure should be a light meal and then it is ok to progress to your normal diet. A half-sandwich or bowl of soup is an example of a good first meal. Heavy or fried foods are harder to digest and may make you feel nauseous or bloated. Drink plenty of fluids but you should avoid alcoholic beverages for 24 hours. If you had a esophageal dilation, please see attached instructions for diet.    ACTIVITY: Your care partner should take you home directly after the procedure. You should plan to take it easy, moving slowly for the rest of the day. You can resume normal activity the day after the procedure however YOU SHOULD NOT DRIVE, use power tools, machinery or perform tasks that involve climbing or major physical exertion for 24 hours (because of the sedation medicines used during the test).   SYMPTOMS TO REPORT IMMEDIATELY: A gastroenterologist can be reached at any hour. Please call [phone]  for any of the following symptoms:   . Following upper endoscopy (EGD, EUS, ERCP, esophageal dilation) Vomiting of blood or coffee ground material  New, significant abdominal pain  New, significant chest pain or pain under the shoulder blades  Painful  or persistently difficult swallowing  New shortness of breath  Black, tarry-looking or red, bloody stools  FOLLOW UP:  If any biopsies were taken you will be contacted by phone or by letter within the next 1-3 weeks. Call [phone]  if you have not heard about the biopsies in 3 weeks.  Please also call with any specific questions about appointments or follow up tests. YOU HAD AN ENDOSCOPIC PROCEDURE TODAY: Refer to the procedure report and other information in the discharge instructions given to you for any specific questions about what was found during the examination. If this information does not answer your questions, please call [facility] office at [phone] to clarify.   YOU SHOULD EXPECT: Some feelings of bloating in the abdomen. Passage of more gas than usual. Walking can help get rid of the air that was put into your GI tract during the procedure and reduce the bloating. If you had a lower endoscopy (such as a colonoscopy or flexible sigmoidoscopy) you may notice spotting of blood in your stool or on the toilet paper. Some abdominal soreness may be present for a day or two, also.  DIET: Your first meal following the procedure should be a light meal and then it is ok to progress to your normal diet. A half-sandwich or bowl of soup is an example of a good first meal. Heavy or fried foods are harder to digest and may make you feel nauseous or bloated. Drink plenty of fluids but you should avoid alcoholic beverages for 24  hours. If you had a esophageal dilation, please see attached instructions for diet.    ACTIVITY: Your care partner should take you home directly after the procedure. You should plan to take it easy, moving slowly for the rest of the day. You can resume normal activity the day after the procedure however YOU SHOULD NOT DRIVE, use power tools, machinery or perform tasks that involve climbing or major physical exertion for 24 hours (because of the sedation medicines used during  the test).   SYMPTOMS TO REPORT IMMEDIATELY: A gastroenterologist can be reached at any hour. Please call [phone]  for any of the following symptoms:  . Following upper endoscopy (EGD, EUS, ERCP, esophageal dilation) Vomiting of blood or coffee ground material  New, significant abdominal pain  New, significant chest pain or pain under the shoulder blades  Painful or persistently difficult swallowing  New shortness of breath  Black, tarry-looking or red, bloody stools  FOLLOW UP:  If any biopsies were taken you will be contacted by phone or by letter within the next 1-3 weeks. Call [phone]  if you have not heard about the biopsies in 3 weeks.  Please also call with any specific questions about appointments or follow up tests.

## 2020-09-29 NOTE — Anesthesia Procedure Notes (Signed)
Procedure Name: MAC Date/Time: 09/29/2020 12:28 PM Performed by: Lieutenant Diego, CRNA Pre-anesthesia Checklist: Patient identified, Emergency Drugs available, Suction available, Patient being monitored and Timeout performed Patient Re-evaluated:Patient Re-evaluated prior to induction Oxygen Delivery Method: Simple face mask Preoxygenation: Pre-oxygenation with 100% oxygen Induction Type: IV induction

## 2020-09-29 NOTE — Anesthesia Preprocedure Evaluation (Addendum)
Anesthesia Evaluation  Patient identified by MRN, date of birth, ID band Patient awake    Reviewed: Allergy & Precautions, NPO status , Patient's Chart, lab work & pertinent test results  Airway Mallampati: II  TM Distance: >3 FB Neck ROM: Full    Dental  (+) Dental Advisory Given   Pulmonary neg shortness of breath, COPD, neg recent URI, Current Smoker and Patient abstained from smoking.,  Covid-19 Nucleic Acid Test Results Lab Results      Component                Value               Date                      SARSCOV2NAA              NEGATIVE            09/26/2020                [place]              NEGATIVE            10/12/2019                [place]              Not Detected        02/17/2019              breath sounds clear to auscultation       Cardiovascular hypertension, Pt. on medications and Pt. on home beta blockers (-) angina+CHF  (-) Past MI + dysrhythmias  Rhythm:Regular  1. Since the prior study on 02/23/2019 LVEF has increased from 30-35% to  40-45%.  2. There is a false tending in the left ventricular apex. Left  ventricular ejection fraction, by estimation, is 40 to 45%. The left  ventricle has mildly decreased function. The left ventricle demonstrates  global hypokinesis. Left ventricular diastolic  parameters are consistent with Grade II diastolic dysfunction  (pseudonormalization). Elevated left atrial pressure. The average left  ventricular global longitudinal strain is -13.7 %. The global longitudinal  strain is abnormal.  3. Right ventricular systolic function is normal. The right ventricular  size is normal.  4. Left atrial size was moderately dilated.  5. The mitral valve is normal in structure. Mild mitral valve  regurgitation. No evidence of mitral stenosis. Severe mitral annular  calcification.  6. The aortic valve is normal in structure. There is mild calcification  of the aortic  valve. There is mild thickening of the aortic valve. Aortic  valve regurgitation is not visualized. Mild to moderate aortic valve  sclerosis/calcification is present,  without any evidence of aortic stenosis.  7. The inferior vena cava is normal in size with greater than 50%  respiratory variability, suggesting right atrial pressure of 3 mmHg.    Neuro/Psych PSYCHIATRIC DISORDERS Depression    GI/Hepatic Neg liver ROS, GERD  Medicated and Controlled,  Endo/Other  diabetes, Type 2Lab Results      Component                Value               Date                      HGBA1C  6.6 (H)             08/07/2020             Renal/GU CRFRenal diseaseLab Results      Component                Value               Date                      CREATININE               1.73 (H)            08/07/2020           Lab Results      Component                Value               Date                      K                        4.3                 08/07/2020                Musculoskeletal  (+) Arthritis ,   Abdominal   Peds  Hematology negative hematology ROS (+) Lab Results      Component                Value               Date                      WBC                      8.6                 12/16/2019                HGB                      13.4                12/16/2019                HCT                      41.5                12/16/2019                MCV                      81                  12/16/2019                PLT                      266                 12/16/2019              Anesthesia Other Findings [name]  is a 75 y.o. female with chronic systolic and diastolic heart failure (LVEF 25 to 30%, nonischemic), PAF, diabetes, hyperlipidemia, hypertension, and tobacco abuse here for follow up.  She was previously a patient of Dr. Ron Parker and then Dr. Irish Lack.  She was first diagnosed with heart failure 06/2009.  At that time her LVEF was 25%.  This was  associated with atrial fibrillation.  She reports having an ablation with Dr. [name] in 1999, though those records are not available at this time.  She has undergone cardioversions for atrial fibrillation with Dr. Ron Parker.  LVEF has ranged from 25 to 35% but has not improved despite beta-blockers and ACE inhibitor.  She had an echo 11/2011 that revealed LVEF 50 to 55%.  However subsequent studies have ranged from 20-35%.  LVEF on Lexiscan Myoview 09/2014 was 41%.  She developed a left bundle branch block pattern in 2014.  Her cardiomyopathy is nonischemic.  She last saw Dr. Irish Lack 04/2018 and carvedilol was increased due to concern for atrial tachycardia.  She felt fatigued and shaky at the higher dose.  Her ACE inhibitor was reduced 2/2 CKD III.  Prior to that she called the office 12/2017 with volume overload.  She previously discussed ICD with Dr. Irish Lack but was not interested.  We continued this discussion and she continues to want to think about it.  She was considering CRT-D after coronavirus.  Carvedilol was switched to metoprolol due to hypotension and tachycardia.  Her heart rate remained elevated but her energy levels improved.  Digoxin was started due to poor heart rate control and heart failure.  [name] had a recent episode of syncope.  She was referred for an ambulatory monitor which revealed atrial fibrillation with PVCs.  She saw Dr. [name] on 4/6 who thought she was actually in atrial tachycardia.  He recommended waiting on ICD to see if her LVEF  Improved and felt she was unlikely to benefit from CRT.  After her last appointment [name] underwent successful DCCV for atrial tachycardia on 10/15/19.  Since her last appointment she has been struggling with pain in her right hand.  She is working with Dr. Baird Cancer on that.  From a cardiac standpoint she has been doing well.  Her heart rate has been consistently in the 50s-60s since cardioversion.  She no longer feels like she will pass out.  She  denies lower extremity edema, orthopnea or PND.  Her weight has been stable. She recently had a lung screening CT that showed coronary calcification and emphysema.  She is motivated to quit smoking.  She continues to smoke about half pack daily but is trying to cut back.  She isn't interested in any medications.  Lately she has her good days and bad days.  Her breathing is stable and she denies chest pain.  She walks to and from her mailbox and around the house more.  She does this a couple times per day.  She has no edema, orthopnea or PND.  She checks her BP daily every morning.  Some mornings her BP is as low as 104.    Reproductive/Obstetrics                            Anesthesia Physical Anesthesia Plan  ASA: II  Anesthesia Plan: MAC   Post-op Pain Management:    Induction: Intravenous  PONV Risk Score and Plan: 1 and Propofol infusion and Treatment may vary due to age  or medical condition  Airway Management Planned: Nasal Cannula  Additional Equipment: None  Intra-op Plan:   Post-operative Plan:   Informed Consent: I have reviewed the patients History and Physical, chart, labs and discussed the procedure including the risks, benefits and alternatives for the proposed anesthesia with the patient or authorized representative who has indicated his/her understanding and acceptance.     Dental advisory given  Plan Discussed with: CRNA  Anesthesia Plan Comments:         Anesthesia Quick Evaluation

## 2020-10-02 ENCOUNTER — Encounter (HOSPITAL_COMMUNITY): Payer: Self-pay | Admitting: Gastroenterology

## 2020-10-02 LAB — SURGICAL PATHOLOGY

## 2020-10-03 NOTE — Anesthesia Postprocedure Evaluation (Signed)
Anesthesia Post Note  Patient: [name]  Procedure(s) Performed: UPPER ESOPHAGEAL ENDOSCOPIC ULTRASOUND (EUS) (N/A ) BIOPSY ESOPHAGOGASTRODUODENOSCOPY (EGD) WITH PROPOFOL (N/A )     Patient location during evaluation: Endoscopy Anesthesia Type: MAC Level of consciousness: awake and alert Pain management: pain level controlled Vital Signs Assessment: post-procedure vital signs reviewed and stable Respiratory status: spontaneous breathing, nonlabored ventilation, respiratory function stable and patient connected to nasal cannula oxygen Cardiovascular status: stable and blood pressure returned to baseline Postop Assessment: no apparent nausea or vomiting Anesthetic complications: no   No complications documented.  Last Vitals:  Vitals:   09/29/20 1301 09/29/20 1311  BP: (!) 146/67 (!) 142/64  Pulse: 61 (!) 59  Resp: 18 12  Temp:    SpO2: 100% 99%    Last Pain:  Vitals:   10/02/20 1534  TempSrc:   PainSc: 0-No pain                 Kenden Brandt

## 2020-10-13 ENCOUNTER — Telehealth: Payer: Medicare Other

## 2020-10-27 ENCOUNTER — Other Ambulatory Visit: Payer: Self-pay | Admitting: Internal Medicine

## 2020-10-31 ENCOUNTER — Other Ambulatory Visit: Payer: Self-pay

## 2020-10-31 MED ORDER — VICTOZA 18 MG/3ML ~~LOC~~ SOPN: PEN_INJECTOR | SUBCUTANEOUS | 3 refills | Status: DC

## 2020-11-03 ENCOUNTER — Ambulatory Visit: Payer: Medicare Other | Admitting: Cardiovascular Disease

## 2020-11-15 DIAGNOSIS — H43813 Vitreous degeneration, bilateral: Secondary | ICD-10-CM | POA: Diagnosis not present

## 2020-11-15 DIAGNOSIS — H16223 Keratoconjunctivitis sicca, not specified as Sjogren's, bilateral: Secondary | ICD-10-CM | POA: Diagnosis not present

## 2020-11-15 DIAGNOSIS — H35022 Exudative retinopathy, left eye: Secondary | ICD-10-CM | POA: Diagnosis not present

## 2020-11-15 LAB — HM DIABETES EYE EXAM

## 2020-11-16 ENCOUNTER — Telehealth: Payer: Medicare Other

## 2020-11-28 ENCOUNTER — Telehealth: Payer: Self-pay

## 2020-11-28 ENCOUNTER — Telehealth: Payer: Medicare Other

## 2020-11-28 NOTE — Telephone Encounter (Signed)
  Care Management   Follow Up Note   11/28/2020 Name: [name] MRN: [id number] DOB: April 29, 1946   Referred by: [name], MD Reason for referral : Chronic Care Management (Unsuccessful call)   An unsuccessful telephone outreach was attempted today. The patient was referred to the case management team for assistance with care management and care coordination.   Follow Up Plan: The care management team will reach out to the patient again over the next 30 days.   Daneen Schick, BSW, CDP Social Worker, Certified Dementia Practitioner [place] [facility] [phone]

## 2020-11-29 ENCOUNTER — Other Ambulatory Visit: Payer: Self-pay | Admitting: Internal Medicine

## 2020-12-07 ENCOUNTER — Other Ambulatory Visit: Payer: Self-pay

## 2020-12-07 ENCOUNTER — Ambulatory Visit (INDEPENDENT_AMBULATORY_CARE_PROVIDER_SITE_OTHER): Payer: Medicare Other | Admitting: Internal Medicine

## 2020-12-07 ENCOUNTER — Encounter: Payer: Self-pay | Admitting: Internal Medicine

## 2020-12-07 VITALS — BP 122/68 | HR 65 | Temp 97.8°F | Ht 67.0 in | Wt 219.0 lb

## 2020-12-07 DIAGNOSIS — Z79899 Other long term (current) drug therapy: Secondary | ICD-10-CM | POA: Diagnosis not present

## 2020-12-07 DIAGNOSIS — E6609 Other obesity due to excess calories: Secondary | ICD-10-CM

## 2020-12-07 DIAGNOSIS — N2581 Secondary hyperparathyroidism of renal origin: Secondary | ICD-10-CM

## 2020-12-07 DIAGNOSIS — I471 Supraventricular tachycardia: Secondary | ICD-10-CM

## 2020-12-07 DIAGNOSIS — E1122 Type 2 diabetes mellitus with diabetic chronic kidney disease: Secondary | ICD-10-CM | POA: Diagnosis not present

## 2020-12-07 DIAGNOSIS — Z794 Long term (current) use of insulin: Secondary | ICD-10-CM | POA: Diagnosis not present

## 2020-12-07 DIAGNOSIS — J438 Other emphysema: Secondary | ICD-10-CM | POA: Diagnosis not present

## 2020-12-07 DIAGNOSIS — N1832 Chronic kidney disease, stage 3b: Secondary | ICD-10-CM | POA: Diagnosis not present

## 2020-12-07 DIAGNOSIS — I130 Hypertensive heart and chronic kidney disease with heart failure and stage 1 through stage 4 chronic kidney disease, or unspecified chronic kidney disease: Secondary | ICD-10-CM

## 2020-12-07 DIAGNOSIS — Z6834 Body mass index (BMI) 34.0-34.9, adult: Secondary | ICD-10-CM

## 2020-12-07 MED ORDER — PANTOPRAZOLE SODIUM 20 MG PO TBEC: 20.0000 mg | DELAYED_RELEASE_TABLET | Freq: Every day | ORAL | 1 refills | Status: DC | PRN

## 2020-12-07 NOTE — Progress Notes (Signed)
I,[name],acting as a Education administrator for Shelby Greenland, MD.,have documented all relevant documentation on the behalf of Shelby Greenland, MD,as directed by  Shelby Greenland, MD while in the presence of Shelby Greenland, MD.  This visit occurred during the SARS-CoV-2 public health emergency.  Safety protocols were in place, including screening questions prior to the visit, additional usage of staff PPE, and extensive cleaning of exam room while observing appropriate contact time as indicated for disinfecting solutions.  Subjective:     Patient ID: [name] , female    DOB: [birth year] , 75 y.o.   MRN: [id number]   Chief Complaint  Patient presents with   Diabetes   Hypertension     HPI  The patient is here for a follow-up on her diabetes and blood pressure. She reports compliance with meds. She denies headaches, chest pain and shortness of breath. She admits she is not getting much exercise.   Diabetes She presents for her follow-up diabetic visit. She has type 2 diabetes mellitus. Her disease course has been stable. There are no hypoglycemic associated symptoms. Pertinent negatives for diabetes include no blurred vision. There are no hypoglycemic complications. Diabetic complications include nephropathy. Risk factors for coronary artery disease include diabetes mellitus, dyslipidemia, hypertension, obesity, post-menopausal and sedentary lifestyle. Meal planning includes avoidance of concentrated sweets. She participates in exercise intermittently. An ACE inhibitor/angiotensin II receptor blocker is being taken. Eye exam is current.  Hypertension This is a chronic problem. The current episode started more than 1 year ago. The problem has been gradually improving since onset. The problem is controlled. Pertinent negatives include no blurred vision. Risk factors for coronary artery disease include diabetes mellitus, dyslipidemia, obesity, post-menopausal state and sedentary lifestyle. The  current treatment provides moderate improvement. Compliance problems include exercise.     Past Medical History:  Diagnosis Date   Arthritis    "left knee" (03/12/2013)   Atrial fibrillation ([place])    Cardioverted to NSR 08/22/2009   Atrial tachycardia ([place]) 09/30/2019   Cardiomyopathy    CHF (congestive heart failure) (HCC)    CKD (chronic kidney disease)    Depression    Dizziness    DM2 (diabetes mellitus, type 2) (HCC)    fasting 80-100 (03/12/2013)   Drug therapy 1/11   coumadin   Ejection fraction < 50%    20-25%, echo.06/2009, /  EF 30-35%, diffuse hypokinesis, echo, Oct 16, 2010   GERD (gastroesophageal reflux disease)    Gout    "left knee" (03/12/2013)   History of bronchitis    History of emphysema    HLD (hyperlipidemia)    Hyperkalemia    Hypertension    LFT elevation    06/2009   Mitral regurgitation    mild - echo - 1/11 /  Mild, echo, May15, 2012   Overweight(278.02)    Pneumonia    "@ least twice" (03/12/2013)   Thyroid dysfunction    thyromegally,diffuse,nodule LLL,06/2009   Tobacco abuse    Tricuspid regurgitation    moderate - echo - 1/11     Family History  Problem Relation Age of Onset   Stroke Mother    Prostate cancer Father    Colon cancer Other    Heart disease Other    Heart attack Son    Hypertension Sister    Hypertension Son        X2   Stroke Maternal Grandfather    Stroke Paternal Grandmother      Current Outpatient Medications:  acetaminophen (TYLENOL) 500 MG tablet, Take 1,000 mg by mouth 3 (three) times daily as needed for pain., Disp: , Rfl:    BD PEN NEEDLE NANO U/F 32G X 4 MM MISC, USE AS DIRECTED WITH INSULIN PENS, Disp: 100 each, Rfl: 11   betamethasone dipropionate 0.05 % cream, Apply 1 application topically 2 (two) times daily as needed (dry skin)., Disp: 15 g, Rfl: 0   Cholecalciferol (VITAMIN D) 2000 UNITS CAPS, Take 2,000 Units by mouth daily., Disp: , Rfl:    diclofenac Sodium (VOLTAREN) 1 % GEL, Apply 4 g  topically 4 (four) times daily. (Patient taking differently: Apply 4 g topically 4 (four) times daily as needed (pain).), Disp: 100 g, Rfl: 0   digoxin (LANOXIN) 0.125 MG tablet, TAKE 1 TABLET BY MOUTH DAILY (Patient taking differently: Take 0.125 mg by mouth daily.), Disp: 90 tablet, Rfl: 3   ELIQUIS 5 MG TABS tablet, TAKE 1 TABLET BY MOUTH TWICE A DAY, Disp: 180 tablet, Rfl: 1   enalapril (VASOTEC) 5 MG tablet, TAKE 1 TABLET BY MOUTH EVERYDAY AT BEDTIME (Patient taking differently: Take 5 mg by mouth at bedtime.), Disp: 90 tablet, Rfl: 3   fluticasone (FLONASE) 50 MCG/ACT nasal spray, PLACE 1 SPRAY INTO BOTH NOSTRILS DAILY. (Patient taking differently: Place 1 spray into both nostrils daily as needed for allergies.), Disp: 16 mL, Rfl: 2   furosemide (LASIX) 40 MG tablet, TAKE 1 TABLET BY MOUITH DAILY AS NEEDED FOR EDEMA FOR FLUID OVERLOAD (Patient taking differently: Take 40 mg by mouth daily. May take additional dose if swelling), Disp: 90 tablet, Rfl: 2   KLOR-CON M20 20 MEQ tablet, TAKE 1 TABLET (20 MEQ TOTAL) BY MOUTH DAILY. WHEN TAKING FUROSEMIDE (LASIX). (Patient taking differently: Take 20 mEq by mouth daily as needed. When taking double  furosemide (Lasix).), Disp: 90 tablet, Rfl: 2   liraglutide (VICTOZA) 18 MG/3ML SOPN, INJECT 1.2 MG UNDER THE SKIN ONCE DAILY, Disp: 9 mL, Rfl: 3   Multiple Vitamin (MULITIVITAMIN WITH MINERALS) TABS, Take 1 tablet by mouth daily. Centrum Silver, Disp: , Rfl:    nystatin cream (MYCOSTATIN), Apply 1 application topically 2 (two) times daily as needed for dry skin (under breasts)., Disp: , Rfl:    ONETOUCH VERIO test strip, USE AS DIRECTED TO CHECK BLOOD SUGARS 1 TIME PER DAY DX: E11.22, Disp: 50 strip, Rfl: 7   Pitavastatin Calcium (LIVALO) 2 MG TABS, Take 1 tablet (2 mg total) by mouth daily., Disp: 90 tablet, Rfl: 3   traMADol (ULTRAM) 50 MG tablet, Take 1 tablet (50 mg total) by mouth every 12 (twelve) hours as needed. (Patient taking differently: Take 50 mg  by mouth every 12 (twelve) hours as needed for severe pain.), Disp: 30 tablet, Rfl: 0   vitamin C (ASCORBIC ACID) 500 MG tablet, Take 500 mg by mouth daily., Disp: , Rfl:    metoprolol succinate (TOPROL-XL) 50 MG 24 hr tablet, TAKE 1 TABLET (50 MG TOTAL) BY MOUTH DAILY. TAKE WITH OR IMMEDIATELY FOLLOWING A MEAL., Disp: 90 tablet, Rfl: 3   pantoprazole (PROTONIX) 20 MG tablet, Take 1 tablet (20 mg total) by mouth daily as needed for heartburn., Disp: 90 tablet, Rfl: 1   Allergies  Allergen Reactions   Codeine Hives and Nausea And Vomiting   Augmentin [Amoxicillin-Pot Clavulanate] Diarrhea   Coreg [Carvedilol]     No energy   Warfarin And Related     Severe rectal bleeding     Review of Systems  Constitutional: Negative.   Eyes:  Negative for blurred vision.  Respiratory: Negative.    Cardiovascular: Negative.   Gastrointestinal: Negative.   Neurological: Negative.   Psychiatric/Behavioral: Negative.      Today's Vitals   12/07/20 1059  BP: 122/68  Pulse: 65  Temp: 97.8 F (36.6 C)  TempSrc: Oral  Weight: 219 lb (99.3 kg)  Height: '5\' 7"'  (1.702 m)  PainSc: 0-No pain   Body mass index is 34.3 kg/m.  Wt Readings from Last 3 Encounters:  12/07/20 219 lb (99.3 kg)  09/29/20 201 lb (91.2 kg)  08/07/20 222 lb 9.6 oz (101 kg)    BP Readings from Last 3 Encounters:  12/07/20 122/68  09/29/20 (!) 142/64  08/07/20 132/78    Objective:  Physical Exam Vitals and nursing note reviewed.  Constitutional:      Appearance: Normal appearance. She is obese.  HENT:     Head: Normocephalic and atraumatic.     Nose:     Comments: Masked     Mouth/Throat:     Comments: Masked  Cardiovascular:     Rate and Rhythm: Normal rate and regular rhythm.     Heart sounds: Normal heart sounds.  Pulmonary:     Effort: Pulmonary effort is normal.     Breath sounds: Normal breath sounds.  Musculoskeletal:     Cervical back: Normal range of motion.  Skin:    General: Skin is warm.   Neurological:     General: No focal deficit present.     Mental Status: She is alert.  Psychiatric:        Mood and Affect: Mood normal.        Behavior: Behavior normal.        Assessment And Plan:     1. Type 2 diabetes mellitus with stage 3b chronic kidney disease, with long-term current use of insulin (HCC) Comments: Chronic, I will check DM-related labs, along with CKD labs today. Importance of dietary compliance was d/w pt.  - Hemoglobin A1c - BMP8+EGFR - CBC no Diff - US Renal; Future - Protein electrophoresis, serum - Parathyroid Hormone, Intact w/Ca - Phosphorus  2. Hypertensive heart and renal disease with renal failure, stage 1 through stage 4 or unspecified chronic kidney disease, with heart failure ([place]) Comments: Chronic, well controlled. Advised to follow low sodium diet.   3. Secondary hyperparathyroidism of renal origin [facility]) Comments: Chronic, I will check PTH level today.   4. Other emphysema ([place]) Comments: This has been noted on low dose CT chest, she is not on any active treatment at this time.   5. Drug therapy - Vitamin B12  6. Class 1 obesity due to excess calories with serious comorbidity and body mass index (BMI) of 34.0 to 34.9 in adult  She is encouraged to strive for BMI less than 30 to decrease cardiac risk. Advised to aim for at least 150 minutes of exercise per week.   Patient was given opportunity to ask questions. Patient verbalized understanding of the plan and was able to repeat key elements of the plan. All questions were answered to their satisfaction.              I, Shelby Greenland, MD, have reviewed all documentation for this visit. The documentation on 12/11/20 for the exam, diagnosis, procedures, and orders are all accurate and complete.  IF YOU HAVE BEEN REFERRED TO A SPECIALIST, IT MAY TAKE 1-2 WEEKS TO SCHEDULE/PROCESS THE REFERRAL. IF YOU HAVE NOT HEARD FROM US/SPECIALIST IN TWO WEEKS, PLEASE GIVE  [place] A CALL AT [phone] X  [phone].   THE PATIENT IS ENCOURAGED TO PRACTICE SOCIAL DISTANCING DUE TO THE COVID-19 PANDEMIC.

## 2020-12-07 NOTE — Patient Instructions (Signed)

## 2020-12-11 LAB — CBC
Hematocrit: 40 % (ref 34.0–46.6)
Hemoglobin: 12.9 g/dL (ref 11.1–15.9)
MCH: 26.3 pg — ABNORMAL LOW (ref 26.6–33.0)
MCHC: 32.3 g/dL (ref 31.5–35.7)
MCV: 82 fL (ref 79–97)
Platelets: 220 10*3/uL (ref 150–450)
RBC: 4.9 x10E6/uL (ref 3.77–5.28)
RDW: 14 % (ref 11.7–15.4)
WBC: 8.3 10*3/uL (ref 3.4–10.8)

## 2020-12-11 LAB — HEMOGLOBIN A1C
Est. average glucose Bld gHb Est-mCnc: 143 mg/dL
Hgb A1c MFr Bld: 6.6 % — ABNORMAL HIGH (ref 4.8–5.6)

## 2020-12-11 LAB — PROTEIN ELECTROPHORESIS, SERUM
A/G Ratio: 1.1 (ref 0.7–1.7)
Albumin ELP: 3.8 g/dL (ref 2.9–4.4)
Alpha 1: 0.3 g/dL (ref 0.0–0.4)
Alpha 2: 1.1 g/dL — ABNORMAL HIGH (ref 0.4–1.0)
Beta: 1.1 g/dL (ref 0.7–1.3)
Gamma Globulin: 1.1 g/dL (ref 0.4–1.8)
Globulin, Total: 3.5 g/dL (ref 2.2–3.9)
Total Protein: 7.3 g/dL (ref 6.0–8.5)

## 2020-12-11 LAB — BMP8+EGFR
BUN/Creatinine Ratio: 22 (ref 12–28)
BUN: 35 mg/dL — ABNORMAL HIGH (ref 8–27)
CO2: 20 mmol/L (ref 20–29)
Calcium: 9.9 mg/dL (ref 8.7–10.3)
Chloride: 101 mmol/L (ref 96–106)
Creatinine, Ser: 1.62 mg/dL — ABNORMAL HIGH (ref 0.57–1.00)
Glucose: 111 mg/dL — ABNORMAL HIGH (ref 65–99)
Potassium: 4.8 mmol/L (ref 3.5–5.2)
Sodium: 139 mmol/L (ref 134–144)
eGFR: 33 mL/min/{1.73_m2} — ABNORMAL LOW (ref >59)

## 2020-12-11 LAB — PTH, INTACT AND CALCIUM: PTH: 71 pg/mL — ABNORMAL HIGH (ref 15–65)

## 2020-12-11 LAB — PHOSPHORUS: Phosphorus: 3.7 mg/dL (ref 3.0–4.3)

## 2020-12-11 LAB — VITAMIN B12: Vitamin B-12: 1754 pg/mL — ABNORMAL HIGH (ref 232–1245)

## 2020-12-12 ENCOUNTER — Telehealth: Payer: Medicare Other

## 2020-12-12 ENCOUNTER — Telehealth: Payer: Self-pay

## 2020-12-12 NOTE — Telephone Encounter (Signed)
  Care Management   Follow Up Note   12/12/2020 Name: [name] MRN: [id number] DOB: 1945-06-17   Referred by: [name], MD Reason for referral : Chronic Care Management (RN CM Follow up call - 2nd attempt )   A second unsuccessful telephone outreach was attempted today. The patient was referred to the case management team for assistance with care management and care coordination.   Follow Up Plan: A HIPPA compliant phone message was left for the patient providing contact information and requesting a return call.   Barb Merino, RN, BSN, CCM Care Management Coordinator [place] [facility]  Direct Phone: [phone]

## 2020-12-13 ENCOUNTER — Telehealth: Payer: Medicare Other

## 2020-12-13 ENCOUNTER — Encounter: Payer: Self-pay | Admitting: Internal Medicine

## 2020-12-18 ENCOUNTER — Telehealth: Payer: Self-pay

## 2020-12-18 NOTE — Telephone Encounter (Signed)
The pt called and said she is having sharp pains in her head, at her temple area, the pt's bp was normal. The pt was notified to go to the ER for evaluation.

## 2020-12-22 ENCOUNTER — Other Ambulatory Visit: Payer: Self-pay | Admitting: Internal Medicine

## 2020-12-22 DIAGNOSIS — I130 Hypertensive heart and chronic kidney disease with heart failure and stage 1 through stage 4 chronic kidney disease, or unspecified chronic kidney disease: Secondary | ICD-10-CM

## 2020-12-22 DIAGNOSIS — Z794 Long term (current) use of insulin: Secondary | ICD-10-CM

## 2020-12-22 DIAGNOSIS — N1832 Chronic kidney disease, stage 3b: Secondary | ICD-10-CM

## 2020-12-22 DIAGNOSIS — E1122 Type 2 diabetes mellitus with diabetic chronic kidney disease: Secondary | ICD-10-CM

## 2020-12-27 ENCOUNTER — Telehealth: Payer: Medicare Other

## 2020-12-27 ENCOUNTER — Telehealth: Payer: Self-pay

## 2020-12-27 NOTE — Telephone Encounter (Signed)
  Care Management   Follow Up Note   12/27/2020 Name: [name] MRN: [id number] DOB: [birth year]   Referred by: [name], MD Reason for referral : Chronic Care Management (Inbound call from patient )   An unsuccessful telephone outreach was attempted today. The patient was referred to the case management team for assistance with care management and care coordination.   Follow Up Plan: Telephone follow up appointment with care management team member scheduled for: 01/12/21  Barb Merino, RN, BSN, CCM Care Management Coordinator [place] [facility]  Direct Phone: [phone]

## 2020-12-27 NOTE — Telephone Encounter (Signed)
  Care Management   Follow Up Note   12/27/2020 Name: [name] MRN: [id number] DOB: July 20, 1945   Referred by: [name], MD Reason for referral : Chronic Care Management (Unsuccessul call)   A second unsuccessful telephone outreach was attempted today. The patient was referred to the case management team for assistance with care management and care coordination. SW left a HIPAA compliant voice message requesting a return call.  Follow Up Plan: The care management team will reach out to the patient again over the next 30 days.   Daneen Schick, BSW, CDP Social Worker, Certified Dementia Practitioner [place] [facility] [place] [facility] [phone]

## 2021-01-12 ENCOUNTER — Telehealth: Payer: Medicare Other

## 2021-01-26 ENCOUNTER — Telehealth: Payer: Medicare Other

## 2021-01-26 ENCOUNTER — Ambulatory Visit (INDEPENDENT_AMBULATORY_CARE_PROVIDER_SITE_OTHER): Payer: Medicare Other

## 2021-01-26 DIAGNOSIS — E1122 Type 2 diabetes mellitus with diabetic chronic kidney disease: Secondary | ICD-10-CM

## 2021-01-26 DIAGNOSIS — I130 Hypertensive heart and chronic kidney disease with heart failure and stage 1 through stage 4 chronic kidney disease, or unspecified chronic kidney disease: Secondary | ICD-10-CM | POA: Diagnosis not present

## 2021-01-26 DIAGNOSIS — Z794 Long term (current) use of insulin: Secondary | ICD-10-CM | POA: Diagnosis not present

## 2021-01-26 DIAGNOSIS — N1832 Chronic kidney disease, stage 3b: Secondary | ICD-10-CM | POA: Diagnosis not present

## 2021-01-26 NOTE — Chronic Care Management (AMB) (Signed)
  Care Management   Follow Up Note   01/26/2021 Name: [name] MRN: [id number] DOB: 14-Oct-1945   Referred by: [name], MD Reason for referral : Chronic Care Management (Unsuccessful outreach)   Third unsuccessful telephone outreach was attempted today. The patient was referred to the case management team for assistance with care management and care coordination. The patient's primary care provider has been notified of our unsuccessful attempts to make or maintain contact with the patient. The care management team is pleased to engage with this patient at any time in the future should he/she be interested in assistance from the care management team.   Follow Up Plan: A HIPPA compliant phone message was left for the patient providing contact information and requesting a return call. SW will sign off due to inability to maintain patient contact. The patient will remain engaged with RN Care Manager.  Daneen Schick, BSW, CDP Social Worker, Certified Dementia Practitioner [place] [facility] [place] [facility] [phone]

## 2021-01-26 NOTE — Patient Instructions (Signed)
Social Worker Visit Information  Goals we discussed today:   Goals Addressed             This Visit's Progress    COMPLETED: Quality of Life Maintained       Timeframe:  Long-Range Goal Priority:  Medium Start Date: 3.25.22                            Expected End Date: 6.23.22                      Unable to maintain patient contact- goal closed  Patient Goals/Self-Care Activities Over the next 21 days, patient will:   - Patient will self administer medications as prescribed Patient will call provider office for new concerns or questions Contact SW as needed prior to next scheduled call         Materials Provided: No. Patient not reached.  Follow Up Plan: No SW follow up planned at this time. Please contact me as needed.   Daneen Schick, BSW, CDP Social Worker, Certified Dementia Practitioner [place] [facility] [place] [facility] [phone]

## 2021-02-09 ENCOUNTER — Ambulatory Visit (HOSPITAL_BASED_OUTPATIENT_CLINIC_OR_DEPARTMENT_OTHER): Payer: Medicare Other | Admitting: Cardiovascular Disease

## 2021-02-12 ENCOUNTER — Ambulatory Visit: Payer: Self-pay

## 2021-02-12 ENCOUNTER — Telehealth: Payer: Medicare Other

## 2021-02-12 DIAGNOSIS — N1831 Chronic kidney disease, stage 3a: Secondary | ICD-10-CM

## 2021-02-12 DIAGNOSIS — I130 Hypertensive heart and chronic kidney disease with heart failure and stage 1 through stage 4 chronic kidney disease, or unspecified chronic kidney disease: Secondary | ICD-10-CM

## 2021-02-12 DIAGNOSIS — Z794 Long term (current) use of insulin: Secondary | ICD-10-CM

## 2021-02-12 DIAGNOSIS — E7800 Pure hypercholesterolemia, unspecified: Secondary | ICD-10-CM

## 2021-02-12 NOTE — Chronic Care Management (AMB) (Signed)
  Care Management   Follow Up Note   02/12/2021 Name: [name] MRN: [id number] DOB: 03/22/1946   Referred by: [name], MD Reason for referral : Chronic Care Management (RN CM Follow up call - 4th attempt )   Third unsuccessful telephone outreach was attempted today. The patient was referred to the case management team for assistance with care management and care coordination. The patient's primary care provider has been notified of our unsuccessful attempts to make or maintain contact with the patient. The care management team is pleased to engage with this patient at any time in the future should he/she be interested in assistance from the care management team.   Follow Up Plan: We have been unable to make contact with the patient for follow up. The care management team is available to follow up with the patient after provider conversation with the patient regarding recommendation for care management engagement and subsequent re-referral to the care management team.   Barb Merino, RN, BSN, CCM Care Management Coordinator [place] [facility]  Direct Phone: [phone]

## 2021-03-09 ENCOUNTER — Other Ambulatory Visit: Payer: Self-pay | Admitting: Cardiovascular Disease

## 2021-03-09 NOTE — Telephone Encounter (Signed)
Rx(s) sent to pharmacy electronically.  

## 2021-03-12 ENCOUNTER — Other Ambulatory Visit: Payer: Self-pay | Admitting: Internal Medicine

## 2021-03-14 ENCOUNTER — Ambulatory Visit (INDEPENDENT_AMBULATORY_CARE_PROVIDER_SITE_OTHER): Payer: Medicare Other | Admitting: Internal Medicine

## 2021-03-14 ENCOUNTER — Ambulatory Visit: Payer: Medicare Other

## 2021-03-14 ENCOUNTER — Encounter: Payer: Self-pay | Admitting: Internal Medicine

## 2021-03-14 ENCOUNTER — Other Ambulatory Visit: Payer: Self-pay

## 2021-03-14 VITALS — BP 128/74 | HR 68 | Temp 98.1°F | Ht 67.0 in | Wt 219.2 lb

## 2021-03-14 DIAGNOSIS — E1122 Type 2 diabetes mellitus with diabetic chronic kidney disease: Secondary | ICD-10-CM

## 2021-03-14 DIAGNOSIS — E7800 Pure hypercholesterolemia, unspecified: Secondary | ICD-10-CM | POA: Diagnosis not present

## 2021-03-14 DIAGNOSIS — Z23 Encounter for immunization: Secondary | ICD-10-CM

## 2021-03-14 DIAGNOSIS — R0989 Other specified symptoms and signs involving the circulatory and respiratory systems: Secondary | ICD-10-CM

## 2021-03-14 DIAGNOSIS — Z0001 Encounter for general adult medical examination with abnormal findings: Secondary | ICD-10-CM

## 2021-03-14 DIAGNOSIS — Z720 Tobacco use: Secondary | ICD-10-CM | POA: Diagnosis not present

## 2021-03-14 DIAGNOSIS — Z0000 Encounter for general adult medical examination without abnormal findings: Secondary | ICD-10-CM

## 2021-03-14 DIAGNOSIS — I471 Supraventricular tachycardia: Secondary | ICD-10-CM

## 2021-03-14 DIAGNOSIS — N1831 Chronic kidney disease, stage 3a: Secondary | ICD-10-CM

## 2021-03-14 DIAGNOSIS — N1832 Chronic kidney disease, stage 3b: Secondary | ICD-10-CM | POA: Diagnosis not present

## 2021-03-14 DIAGNOSIS — I130 Hypertensive heart and chronic kidney disease with heart failure and stage 1 through stage 4 chronic kidney disease, or unspecified chronic kidney disease: Secondary | ICD-10-CM | POA: Diagnosis not present

## 2021-03-14 DIAGNOSIS — H6121 Impacted cerumen, right ear: Secondary | ICD-10-CM

## 2021-03-14 DIAGNOSIS — Z794 Long term (current) use of insulin: Secondary | ICD-10-CM | POA: Diagnosis not present

## 2021-03-14 LAB — POCT URINALYSIS DIPSTICK
Bilirubin, UA: NEGATIVE
Blood, UA: NEGATIVE
Glucose, UA: NEGATIVE
Ketones, UA: NEGATIVE
Leukocytes, UA: NEGATIVE
Nitrite, UA: NEGATIVE
Protein, UA: NEGATIVE
Spec Grav, UA: 1.025 (ref 1.010–1.025)
Urobilinogen, UA: 0.2 E.U./dL
pH, UA: 6.5 (ref 5.0–8.0)

## 2021-03-14 LAB — POCT UA - MICROALBUMIN
Albumin/Creatinine Ratio, Urine, POC: <30
Creatinine, POC: 200 mg/dL
Microalbumin Ur, POC: 30 mg/L

## 2021-03-14 NOTE — Progress Notes (Signed)
Earleen Newport as a Education administrator for Maximino Greenland, MD.,have documented all relevant documentation on the behalf of Maximino Greenland, MD,as directed by  Maximino Greenland, MD while in the presence of Maximino Greenland, MD.  This visit occurred during the SARS-CoV-2 public health emergency.  Safety protocols were in place, including screening questions prior to the visit, additional usage of staff PPE, and extensive cleaning of exam room while observing appropriate contact time as indicated for disinfecting solutions.  Subjective:     Patient ID: [name] , female    DOB: 1946-04-08 , 75 y.o.   MRN: [id number]   Chief Complaint  Patient presents with   Annual Exam   Diabetes   Hypertension    HPI  She is here today for a full physical examination.  She has no specific concerns or complaints at this time. She would like to further discuss getting 3rd covid booster.   Diabetes She presents for her follow-up diabetic visit. She has type 2 diabetes mellitus. Her disease course has been stable. There are no hypoglycemic associated symptoms. Pertinent negatives for diabetes include no blurred vision. There are no hypoglycemic complications. Diabetic complications include nephropathy. Risk factors for coronary artery disease include diabetes mellitus, dyslipidemia, hypertension, obesity, post-menopausal and sedentary lifestyle. Meal planning includes avoidance of concentrated sweets. She participates in exercise intermittently. An ACE inhibitor/angiotensin II receptor blocker is being taken. Eye exam is current.  Hypertension This is a chronic problem. The current episode started more than 1 year ago. The problem has been gradually improving since onset. The problem is controlled. Pertinent negatives include no blurred vision. Risk factors for coronary artery disease include diabetes mellitus, dyslipidemia, obesity, post-menopausal state and sedentary lifestyle. The current treatment provides  moderate improvement. Compliance problems include exercise.     Past Medical History:  Diagnosis Date   Arthritis    "left knee" (03/12/2013)   Atrial fibrillation ([place])    Cardioverted to NSR 08/22/2009   Atrial tachycardia ([place]) 09/30/2019   Cardiomyopathy    CHF (congestive heart failure) (H[facility])    CKD (chronic kidney disease)    Depression    Dizziness    DM2 (diabetes mellitus, type 2) ([facility])    fasting 80-100 (03/12/2013)   Drug therapy 1/11   coumadin   Ejection fraction < 50%    20-25%, echo.06/2009, /  EF 30-35%, diffuse hypokinesis, echo, Oct 16, 2010   GERD (gastroesophageal reflux disease)    Gout    "left knee" (03/12/2013)   History of bronchitis    History of emphysema    HLD (hyperlipidemia)    Hyperkalemia    Hypertension    LFT elevation    06/2009   Mitral regurgitation    mild - echo - 1/11 /  Mild, echo, May15, 2012   Overweight(278.02)    Pneumonia    "@ least twice" (03/12/2013)   Thyroid dysfunction    thyromegally,diffuse,nodule LLL,06/2009   Tobacco abuse    Tricuspid regurgitation    moderate - echo - 1/11     Family History  Problem Relation Age of Onset   Stroke Mother    Prostate cancer Father    Colon cancer Other    Heart disease Other    Heart attack Son    Hypertension Sister    Hypertension Son        X2   Stroke Maternal Grandfather    Stroke Paternal Grandmother      Current Outpatient Medications:  acetaminophen (TYLENOL) 500 MG tablet, Take 1,000 mg by mouth 3 (three) times daily as needed for pain., Disp: , Rfl:    BD PEN NEEDLE NANO U/F 32G X 4 MM MISC, USE AS DIRECTED WITH INSULIN PENS, Disp: 100 each, Rfl: 11   betamethasone dipropionate 0.05 % cream, Apply 1 application topically 2 (two) times daily as needed (dry skin)., Disp: 15 g, Rfl: 0   Cholecalciferol (VITAMIN D) 2000 UNITS CAPS, Take 2,000 Units by mouth daily., Disp: , Rfl:    diclofenac Sodium (VOLTAREN) 1 % GEL, Apply 4 g topically 4 (four) times daily.  (Patient taking differently: Apply 4 g topically 4 (four) times daily as needed (pain).), Disp: 100 g, Rfl: 0   digoxin (LANOXIN) 0.125 MG tablet, TAKE 1 TABLET BY MOUTH DAILY (Patient taking differently: Take 0.125 mg by mouth daily.), Disp: 90 tablet, Rfl: 3   ELIQUIS 5 MG TABS tablet, TAKE 1 TABLET BY MOUTH TWICE A DAY, Disp: 180 tablet, Rfl: 1   enalapril (VASOTEC) 5 MG tablet, Take 1 tablet (5 mg total) by mouth at bedtime., Disp: 90 tablet, Rfl: 0   fluticasone (FLONASE) 50 MCG/ACT nasal spray, PLACE 1 SPRAY INTO BOTH NOSTRILS DAILY. (Patient taking differently: Place 1 spray into both nostrils daily as needed for allergies.), Disp: 16 mL, Rfl: 2   KLOR-CON M20 20 MEQ tablet, TAKE 1 TABLET (20 MEQ TOTAL) BY MOUTH DAILY. WHEN TAKING FUROSEMIDE (LASIX). (Patient taking differently: Take 20 mEq by mouth daily as needed. When taking double  furosemide (Lasix).), Disp: 90 tablet, Rfl: 2   liraglutide (VICTOZA) 18 MG/3ML SOPN, INJECT 1.2 MG UNDER THE SKIN ONCE DAILY, Disp: 9 mL, Rfl: 3   Multiple Vitamin (MULITIVITAMIN WITH MINERALS) TABS, Take 1 tablet by mouth daily. Centrum Silver, Disp: , Rfl:    nystatin cream (MYCOSTATIN), Apply 1 application topically 2 (two) times daily as needed for dry skin (under breasts)., Disp: , Rfl:    ONETOUCH VERIO test strip, USE AS DIRECTED TO CHECK BLOOD SUGARS 1 TIME PER DAY DX: E11.22, Disp: 50 strip, Rfl: 7   pantoprazole (PROTONIX) 20 MG tablet, Take 1 tablet (20 mg total) by mouth daily as needed for heartburn., Disp: 90 tablet, Rfl: 1   vitamin C (ASCORBIC ACID) 500 MG tablet, Take 500 mg by mouth daily., Disp: , Rfl:    furosemide (LASIX) 40 MG tablet, TAKE 1 TABLET BY MOUITH DAILY AS NEEDED FOR EDEMA FOR FLUID OVERLOAD. PLEASE KEEP UPCOMING APPOINTMENT IN NOVEMBER 2022 FOR FUTURE REFILLS. THANK YOU, Disp: 90 tablet, Rfl: 0   LIVALO 2 MG TABS, TAKE 1 TABLET BY MOUTH EVERY DAY, Disp: 90 tablet, Rfl: 0   metoprolol succinate (TOPROL-XL) 50 MG 24 hr tablet, TAKE  1 TABLET (50 MG TOTAL) BY MOUTH DAILY. TAKE WITH OR IMMEDIATELY FOLLOWING A MEAL., Disp: 90 tablet, Rfl: 3   Allergies  Allergen Reactions   Codeine Hives and Nausea And Vomiting   Augmentin [Amoxicillin-Pot Clavulanate] Diarrhea   Coreg [Carvedilol]     No energy   Warfarin And Related     Severe rectal bleeding      The patient states she uses post menopausal status for birth control. Last LMP was No LMP recorded. Patient has had a hysterectomy.. Negative for Dysmenorrhea. Negative for: breast discharge, breast lump(s), breast pain and breast self exam. Associated symptoms include abnormal vaginal bleeding. Pertinent negatives include abnormal bleeding (hematology), anxiety, decreased libido, depression, difficulty falling sleep, dyspareunia, history of infertility, nocturia, sexual dysfunction, sleep disturbances, urinary  incontinence, urinary urgency, vaginal discharge and vaginal itching. Diet regular.The patient states her exercise level is  minimal.  . The patient's tobacco use is:  Social History   Tobacco Use  Smoking Status Every Day   Packs/day: 0.50   Years: 40.00   Pack years: 20.00   Types: Cigarettes  Smokeless Tobacco Never  Tobacco Comments   smoked 1.5ppd x 9, 1ppdx40, cut back to 1/2ppd past 5 years  . She has been exposed to passive smoke. The patient's alcohol use is:  Social History   Substance and Sexual Activity  Alcohol Use No   Review of Systems  Constitutional: Negative.   HENT: Negative.    Eyes: Negative.  Negative for blurred vision.  Respiratory: Negative.    Cardiovascular: Negative.   Gastrointestinal: Negative.   Endocrine: Negative.   Genitourinary: Negative.   Musculoskeletal: Negative.   Skin: Negative.   Allergic/Immunologic: Negative.   Neurological: Negative.   Hematological: Negative.   Psychiatric/Behavioral: Negative.      Today's Vitals   03/14/21 1417  BP: 128/74  Pulse: 68  Temp: 98.1 F (36.7 C)  Weight: 219 lb 3.2  oz (99.4 kg)  Height: '5\' 7"'  (1.702 m)  PainSc: 0-No pain   Body mass index is 34.33 kg/m.  Wt Readings from Last 3 Encounters:  03/21/21 213 lb (96.6 kg)  03/14/21 219 lb 3.2 oz (99.4 kg)  12/07/20 219 lb (99.3 kg)    Objective:  Physical Exam Vitals and nursing note reviewed.  Constitutional:      Appearance: Normal appearance.  HENT:     Head: Normocephalic and atraumatic.     Right Ear: Ear canal and external ear normal. There is impacted cerumen.     Left Ear: Tympanic membrane, ear canal and external ear normal.     Nose:     Comments: MASKED     Mouth/Throat:     Comments: MASKED  Eyes:     Extraocular Movements: Extraocular movements intact.     Conjunctiva/sclera: Conjunctivae normal.     Pupils: Pupils are equal, round, and reactive to light.  Cardiovascular:     Rate and Rhythm: Normal rate and regular rhythm.     Pulses:          Dorsalis pedis pulses are 0 on the right side and 0 on the left side.     Heart sounds: Normal heart sounds.  Pulmonary:     Effort: Pulmonary effort is normal.     Breath sounds: Normal breath sounds.  Chest:  Breasts:    Tanner Score is 5.     Right: Normal.     Left: Normal.     Comments: Pendulous Abdominal:     General: Bowel sounds are normal.     Palpations: Abdomen is soft.  Genitourinary:    Comments: deferred Musculoskeletal:        General: Normal range of motion.     Cervical back: Normal range of motion and neck supple.  Feet:     Right foot:     Protective Sensation: 5 sites tested.  5 sites sensed.     Skin integrity: Callus and dry skin present.     Toenail Condition: Right toenails are abnormally thick and long.     Left foot:     Protective Sensation: 5 sites tested.  5 sites sensed.     Skin integrity: Callus and dry skin present.     Toenail Condition: Left toenails are abnormally thick and long.  Skin:    General: Skin is warm and dry.     Comments: Scattered hyperpigmented scarring  Neurological:      General: No focal deficit present.     Mental Status: She is alert and oriented to person, place, and time.  Psychiatric:        Mood and Affect: Mood normal.        Behavior: Behavior normal.        Assessment And Plan:     1. Annual physical exam Comments: A full exam was performed. Importance of monthly self breast exams was discussed with the patient. PATIENT IS ADVISED TO GET 30-45 MINUTES REGULAR EXERCISE NO LESS THAN FOUR TO FIVE DAYS PER WEEK - BOTH WEIGHTBEARING EXERCISES AND AEROBIC ARE RECOMMENDED.  PATIENT IS ADVISED TO FOLLOW A HEALTHY DIET WITH AT LEAST SIX FRUITS/VEGGIES PER DAY, DECREASE INTAKE OF RED MEAT, AND TO INCREASE FISH INTAKE TO TWO DAYS PER WEEK.  MEATS/FISH SHOULD NOT BE FRIED, BAKED OR BROILED IS PREFERABLE.  IT IS ALSO IMPORTANT TO CUT BACK ON YOUR SUGAR INTAKE. PLEASE AVOID ANYTHING WITH ADDED SUGAR, CORN SYRUP OR OTHER SWEETENERS. IF YOU MUST USE A SWEETENER, YOU CAN TRY STEVIA. IT IS ALSO IMPORTANT TO AVOID ARTIFICIALLY SWEETENERS AND DIET BEVERAGES. LASTLY, I SUGGEST WEARING SPF 50 SUNSCREEN ON EXPOSED PARTS AND ESPECIALLY WHEN IN THE DIRECT SUNLIGHT FOR AN EXTENDED PERIOD OF TIME.  PLEASE AVOID FAST FOOD RESTAURANTS AND INCREASE YOUR WATER INTAKE.   2. Type 2 diabetes mellitus with stage 3b chronic kidney disease, with long-term current use of insulin ([place]) Comments: Diabetic foot exam was performed. Renal u/s pending. She will rto in 4 months for re-evaluation. I DISCUSSED WITH THE PATIENT AT LENGTH REGARDING THE GOALS OF GLYCEMIC CONTROL AND POSSIBLE LONG-TERM COMPLICATIONS.  I  ALSO STRESSED THE IMPORTANCE OF COMPLIANCE WITH HOME GLUCOSE MONITORING, DIETARY RESTRICTIONS INCLUDING AVOIDANCE OF SUGARY DRINKS/PROCESSED FOODS,  ALONG WITH REGULAR EXERCISE.  I  ALSO STRESSED THE IMPORTANCE OF ANNUAL EYE EXAMS, SELF FOOT CARE AND COMPLIANCE WITH OFFICE VISITS.  - CMP14+EGFR - Hemoglobin A1c - Lipid panel - POCT Urinalysis Dipstick (81002) - POCT UA -  Microalbumin  3. Hypertensive heart and renal disease with renal failure, stage 1 through stage 4 or unspecified chronic kidney disease, with heart failure ([place]) Comments: Chronic, well controlled. No med changes. EKG performed, undetermined rhythm, IVCD, R axis, old anterior infarct, nonspecific ST abnormality. Encouraged to follow low sodium diet. - POCT Urinalysis Dipstick (81002) - POCT UA - Microalbumin - EKG 12-Lead  4. Pure hypercholesterolemia Comments: Chronic, on statin therapy. Importance of compliance was d/w patient.  - CBC - Hemoglobin A1c - Lipid panel  5. Impacted cerumen of right ear AFTER OBTAINING VERBAL CONSENT, RIGHT EAR WAS FLUSHED BY IRRIGATION. SHE TOLERATED PROCEDURE WELL WITHOUT ANY COMPLICATIONS. NO TM ABNORMALITIES WERE NOTED. - Ear Lavage  6. Decreased pedal pulses Comments: I will schedule her for ABI. Encouraged to c/w statin therapy and increase daily activity.  - VAS [place] ABI WITH/WO TBI; Future  7. Immunization due Comments: She was given high dose flu vaccine.  - Flu Vaccine QUAD High Dose(Fluad)  8. Tobacco abuse Comments: I will schedule her for low dose CT chest. She is due Nov 2022. - CT CHEST LUNG CA SCREEN LOW DOSE W/O CM; Future Smoking cessation instruction/counseling given:  counseled patient on the dangers of tobacco use, advised patient to stop smoking, and reviewed strategies to maximize success   Patient was given opportunity to ask questions.  Patient verbalized understanding of the plan and was able to repeat key elements of the plan. All questions were answered to their satisfaction.   I, Maximino Greenland, MD, have reviewed all documentation for this visit. The documentation on 03/14/21 for the exam, diagnosis, procedures, and orders are all accurate and complete.   THE PATIENT IS ENCOURAGED TO PRACTICE SOCIAL DISTANCING DUE TO THE COVID-19 PANDEMIC.

## 2021-03-14 NOTE — Patient Instructions (Signed)
Health Maintenance, Female Adopting a healthy lifestyle and getting preventive care are important in promoting health and wellness. Ask your health care provider about: The right schedule for you to have regular tests and exams. Things you can do on your own to prevent diseases and keep yourself healthy. What should I know about diet, weight, and exercise? Eat a healthy diet  Eat a diet that includes plenty of vegetables, fruits, low-fat dairy products, and lean protein. Do not eat a lot of foods that are high in solid fats, added sugars, or sodium. Maintain a healthy weight Body mass index (BMI) is used to identify weight problems. It estimates body fat based on height and weight. Your health care provider can help determine your BMI and help you achieve or maintain a healthy weight. Get regular exercise Get regular exercise. This is one of the most important things you can do for your health. Most adults should: Exercise for at least 150 minutes each week. The exercise should increase your heart rate and make you sweat (moderate-intensity exercise). Do strengthening exercises at least twice a week. This is in addition to the moderate-intensity exercise. Spend less time sitting. Even light physical activity can be beneficial. Watch cholesterol and blood lipids Have your blood tested for lipids and cholesterol at 76 years of age, then have this test every 5 years. Have your cholesterol levels checked more often if: Your lipid or cholesterol levels are high. You are older than 76 years of age. You are at high risk for heart disease. What should I know about cancer screening? Depending on your health history and family history, you may need to have cancer screening at various ages. This may include screening for: Breast cancer. Cervical cancer. Colorectal cancer. Skin cancer. Lung cancer. What should I know about heart disease, diabetes, and high blood pressure? Blood pressure and heart  disease High blood pressure causes heart disease and increases the risk of stroke. This is more likely to develop in people who have high blood pressure readings, are of African descent, or are overweight. Have your blood pressure checked: Every 3-5 years if you are 18-39 years of age. Every year if you are 40 years old or older. Diabetes Have regular diabetes screenings. This checks your fasting blood sugar level. Have the screening done: Once every three years after age 40 if you are at a normal weight and have a low risk for diabetes. More often and at a younger age if you are overweight or have a high risk for diabetes. What should I know about preventing infection? Hepatitis B If you have a higher risk for hepatitis B, you should be screened for this virus. Talk with your health care provider to find out if you are at risk for hepatitis B infection. Hepatitis C Testing is recommended for: Everyone born from 1945 through 1965. Anyone with known risk factors for hepatitis C. Sexually transmitted infections (STIs) Get screened for STIs, including gonorrhea and chlamydia, if: You are sexually active and are younger than 76 years of age. You are older than 76 years of age and your health care provider tells you that you are at risk for this type of infection. Your sexual activity has changed since you were last screened, and you are at increased risk for chlamydia or gonorrhea. Ask your health care provider if you are at risk. Ask your health care provider about whether you are at high risk for HIV. Your health care provider may recommend a prescription medicine   to help prevent HIV infection. If you choose to take medicine to prevent HIV, you should first get tested for HIV. You should then be tested every 3 months for as long as you are taking the medicine. Pregnancy If you are about to stop having your period (premenopausal) and you may become pregnant, seek counseling before you get  pregnant. Take 400 to 800 micrograms (mcg) of folic acid every day if you become pregnant. Ask for birth control (contraception) if you want to prevent pregnancy. Osteoporosis and menopause Osteoporosis is a disease in which the bones lose minerals and strength with aging. This can result in bone fractures. If you are 65 years old or older, or if you are at risk for osteoporosis and fractures, ask your health care provider if you should: Be screened for bone loss. Take a calcium or vitamin D supplement to lower your risk of fractures. Be given hormone replacement therapy (HRT) to treat symptoms of menopause. Follow these instructions at home: Lifestyle Do not use any products that contain nicotine or tobacco, such as cigarettes, e-cigarettes, and chewing tobacco. If you need help quitting, ask your health care provider. Do not use street drugs. Do not share needles. Ask your health care provider for help if you need support or information about quitting drugs. Alcohol use Do not drink alcohol if: Your health care provider tells you not to drink. You are pregnant, may be pregnant, or are planning to become pregnant. If you drink alcohol: Limit how much you use to 0-1 drink a day. Limit intake if you are breastfeeding. Be aware of how much alcohol is in your drink. In the [place] one drink equals one 12 oz bottle of beer (355 mL), one 5 oz glass of wine (148 mL), or one 1 oz glass of hard liquor (44 mL). General instructions Schedule regular health, dental, and eye exams. Stay current with your vaccines. Tell your health care provider if: You often feel depressed. You have ever been abused or do not feel safe at home. Summary Adopting a healthy lifestyle and getting preventive care are important in promoting health and wellness. Follow your health care provider's instructions about healthy diet, exercising, and getting tested or screened for diseases. Follow your health care provider's  instructions on monitoring your cholesterol and blood pressure. This information is not intended to replace advice given to you by your health care provider. Make sure you discuss any questions you have with your health care provider. Document Revised: 07/28/2020 Document Reviewed: 05/13/2018 Elsevier Patient Education  2022 [employer]  

## 2021-03-15 LAB — CMP14+EGFR
ALT: 13 IU/L (ref 0–32)
AST: 18 IU/L (ref 0–40)
Albumin/Globulin Ratio: 1.6 (ref 1.2–2.2)
Albumin: 4.2 g/dL (ref 3.7–4.7)
Alkaline Phosphatase: 93 IU/L (ref 44–121)
BUN/Creatinine Ratio: 21 (ref 12–28)
BUN: 31 mg/dL — ABNORMAL HIGH (ref 8–27)
Bilirubin Total: <0.2 mg/dL (ref 0.0–1.2)
CO2: 22 mmol/L (ref 20–29)
Calcium: 9.3 mg/dL (ref 8.7–10.3)
Chloride: 103 mmol/L (ref 96–106)
Creatinine, Ser: 1.48 mg/dL — ABNORMAL HIGH (ref 0.57–1.00)
Globulin, Total: 2.7 g/dL (ref 1.5–4.5)
Glucose: 100 mg/dL — ABNORMAL HIGH (ref 70–99)
Potassium: 5.4 mmol/L — ABNORMAL HIGH (ref 3.5–5.2)
Sodium: 143 mmol/L (ref 134–144)
Total Protein: 6.9 g/dL (ref 6.0–8.5)
eGFR: 36 mL/min/{1.73_m2} — ABNORMAL LOW (ref >59)

## 2021-03-15 LAB — LIPID PANEL
Chol/HDL Ratio: 3.6 ratio (ref 0.0–4.4)
Cholesterol, Total: 131 mg/dL (ref 100–199)
HDL: 36 mg/dL — ABNORMAL LOW (ref >39)
LDL Chol Calc (NIH): 70 mg/dL (ref 0–99)
Triglycerides: 140 mg/dL (ref 0–149)
VLDL Cholesterol Cal: 25 mg/dL (ref 5–40)

## 2021-03-15 LAB — CBC
Hematocrit: 39.5 % (ref 34.0–46.6)
Hemoglobin: 12.9 g/dL (ref 11.1–15.9)
MCH: 26.4 pg — ABNORMAL LOW (ref 26.6–33.0)
MCHC: 32.7 g/dL (ref 31.5–35.7)
MCV: 81 fL (ref 79–97)
Platelets: 227 10*3/uL (ref 150–450)
RBC: 4.88 x10E6/uL (ref 3.77–5.28)
RDW: 14.3 % (ref 11.7–15.4)
WBC: 9.3 10*3/uL (ref 3.4–10.8)

## 2021-03-15 LAB — HEMOGLOBIN A1C
Est. average glucose Bld gHb Est-mCnc: 146 mg/dL
Hgb A1c MFr Bld: 6.7 % — ABNORMAL HIGH (ref 4.8–5.6)

## 2021-03-21 ENCOUNTER — Telehealth: Payer: Self-pay

## 2021-03-21 ENCOUNTER — Ambulatory Visit (INDEPENDENT_AMBULATORY_CARE_PROVIDER_SITE_OTHER): Payer: Medicare Other

## 2021-03-21 VITALS — Ht 67.0 in | Wt 213.0 lb

## 2021-03-21 DIAGNOSIS — Z23 Encounter for immunization: Secondary | ICD-10-CM

## 2021-03-21 DIAGNOSIS — Z0000 Encounter for general adult medical examination without abnormal findings: Secondary | ICD-10-CM

## 2021-03-21 MED ORDER — PREVNAR 20 0.5 ML IM SUSY: 0.5000 mL | PREFILLED_SYRINGE | INTRAMUSCULAR | 0 refills | Status: AC

## 2021-03-21 NOTE — Patient Instructions (Signed)
[name] , Thank you for taking time to come for your Medicare Wellness Visit. I appreciate your ongoing commitment to your health goals. Please review the following plan we discussed and let me know if I can assist you in the future.   Screening recommendations/referrals: Colonoscopy: completed 09/14/2020 Mammogram: completed 08/24/2020 Bone Density: completed 03/23/2015 Recommended yearly ophthalmology/optometry visit for glaucoma screening and checkup Recommended yearly dental visit for hygiene and checkup  Vaccinations: Influenza vaccine: completed 03/14/2021 Pneumococcal vaccine: sent to pharmacy Tdap vaccine: completed 06/18/2012 Shingles vaccine: completed   Covid-19: 10/27/2020, 03/28/2020, 08/10/2019, 07/19/2019  Advanced directives: Please bring a copy of your POA (Power of Attorney) and/or Living Will to your next appointment.   Conditions/risks identified: smoking  Next appointment: Follow up in one year for your annual wellness visit    Preventive Care 44 Years and Older, Female Preventive care refers to lifestyle choices and visits with your health care provider that can promote health and wellness. What does preventive care include? A yearly physical exam. This is also called an annual well check. Dental exams once or twice a year. Routine eye exams. Ask your health care provider how often you should have your eyes checked. Personal lifestyle choices, including: Daily care of your teeth and gums. Regular physical activity. Eating a healthy diet. Avoiding tobacco and drug use. Limiting alcohol use. Practicing safe sex. Taking low-dose aspirin every day. Taking vitamin and mineral supplements as recommended by your health care provider. What happens during an annual well check? The services and screenings done by your health care provider during your annual well check will depend on your age, overall health, lifestyle risk factors, and family history of  disease. Counseling  Your health care provider may ask you questions about your: Alcohol use. Tobacco use. Drug use. Emotional well-being. Home and relationship well-being. Sexual activity. Eating habits. History of falls. Memory and ability to understand (cognition). Work and work Statistician. Reproductive health. Screening  You may have the following tests or measurements: Height, weight, and BMI. Blood pressure. Lipid and cholesterol levels. These may be checked every 5 years, or more frequently if you are over 63 years old. Skin check. Lung cancer screening. You may have this screening every year starting at age 76 if you have a 30-pack-year history of smoking and currently smoke or have quit within the past 15 years. Fecal occult blood test (FOBT) of the stool. You may have this test every year starting at age 76. Flexible sigmoidoscopy or colonoscopy. You may have a sigmoidoscopy every 5 years or a colonoscopy every 10 years starting at age 76. Hepatitis C blood test. Hepatitis B blood test. Sexually transmitted disease (STD) testing. Diabetes screening. This is done by checking your blood sugar (glucose) after you have not eaten for a while (fasting). You may have this done every 1-3 years. Bone density scan. This is done to screen for osteoporosis. You may have this done starting at age 76. Mammogram. This may be done every 1-2 years. Talk to your health care provider about how often you should have regular mammograms. Talk with your health care provider about your test results, treatment options, and if necessary, the need for more tests. Vaccines  Your health care provider may recommend certain vaccines, such as: Influenza vaccine. This is recommended every year. Tetanus, diphtheria, and acellular pertussis (Tdap, Td) vaccine. You may need a Td booster every 10 years. Zoster vaccine. You may need this after age 76. Pneumococcal 13-valent conjugate (PCV13) vaccine. One  dose is recommended after age 76. Pneumococcal polysaccharide (PPSV23) vaccine. One dose is recommended after age 4. Talk to your health care provider about which screenings and vaccines you need and how often you need them. This information is not intended to replace advice given to you by your health care provider. Make sure you discuss any questions you have with your health care provider. Document Released: 06/16/2015 Document Revised: 02/07/2016 Document Reviewed: 03/21/2015 Elsevier Interactive Patient Education  2017 [place] Prevention in the Home Falls can cause injuries. They can happen to people of all ages. There are many things you can do to make your home safe and to help prevent falls. What can I do on the outside of my home? Regularly fix the edges of walkways and driveways and fix any cracks. Remove anything that might make you trip as you walk through a door, such as a raised step or threshold. Trim any bushes or trees on the path to your home. Use bright outdoor lighting. Clear any walking paths of anything that might make someone trip, such as rocks or tools. Regularly check to see if handrails are loose or broken. Make sure that both sides of any steps have handrails. Any raised decks and porches should have guardrails on the edges. Have any leaves, snow, or ice cleared regularly. Use sand or salt on walking paths during winter. Clean up any spills in your garage right away. This includes oil or grease spills. What can I do in the bathroom? Use night lights. Install grab bars by the toilet and in the tub and shower. Do not use towel bars as grab bars. Use non-skid mats or decals in the tub or shower. If you need to sit down in the shower, use a plastic, non-slip stool. Keep the floor dry. Clean up any water that spills on the floor as soon as it happens. Remove soap buildup in the tub or shower regularly. Attach bath mats securely with double-sided  non-slip rug tape. Do not have throw rugs and other things on the floor that can make you trip. What can I do in the bedroom? Use night lights. Make sure that you have a light by your bed that is easy to reach. Do not use any sheets or blankets that are too big for your bed. They should not hang down onto the floor. Have a firm chair that has side arms. You can use this for support while you get dressed. Do not have throw rugs and other things on the floor that can make you trip. What can I do in the kitchen? Clean up any spills right away. Avoid walking on wet floors. Keep items that you use a lot in easy-to-reach places. If you need to reach something above you, use a strong step stool that has a grab bar. Keep electrical cords out of the way. Do not use floor polish or wax that makes floors slippery. If you must use wax, use non-skid floor wax. Do not have throw rugs and other things on the floor that can make you trip. What can I do with my stairs? Do not leave any items on the stairs. Make sure that there are handrails on both sides of the stairs and use them. Fix handrails that are broken or loose. Make sure that handrails are as long as the stairways. Check any carpeting to make sure that it is firmly attached to the stairs. Fix any carpet that is loose or worn. Avoid  having throw rugs at the top or bottom of the stairs. If you do have throw rugs, attach them to the floor with carpet tape. Make sure that you have a light switch at the top of the stairs and the bottom of the stairs. If you do not have them, ask someone to add them for you. What else can I do to help prevent falls? Wear shoes that: Do not have high heels. Have rubber bottoms. Are comfortable and fit you well. Are closed at the toe. Do not wear sandals. If you use a stepladder: Make sure that it is fully opened. Do not climb a closed stepladder. Make sure that both sides of the stepladder are locked into place. Ask  someone to hold it for you, if possible. Clearly mark and make sure that you can see: Any grab bars or handrails. First and last steps. Where the edge of each step is. Use tools that help you move around (mobility aids) if they are needed. These include: Canes. Walkers. Scooters. Crutches. Turn on the lights when you go into a dark area. Replace any light bulbs as soon as they burn out. Set up your furniture so you have a clear path. Avoid moving your furniture around. If any of your floors are uneven, fix them. If there are any pets around you, be aware of where they are. Review your medicines with your doctor. Some medicines can make you feel dizzy. This can increase your chance of falling. Ask your doctor what other things that you can do to help prevent falls. This information is not intended to replace advice given to you by your health care provider. Make sure you discuss any questions you have with your health care provider. Document Released: 03/16/2009 Document Revised: 10/26/2015 Document Reviewed: 06/24/2014 Elsevier Interactive Patient Education  2017 [employer].

## 2021-03-21 NOTE — Telephone Encounter (Signed)
The patient said that she was unable to make it to the office today. The pt was asked if she wanted to do her AWV visit over the phone and the pt said yes to call her home phone.

## 2021-03-21 NOTE — Progress Notes (Signed)
I connected with [name] today by telephone and verified that I am speaking with the correct person using two identifiers. Location patient: home Location provider: work Persons participating in the virtual visit: [name], Glenna Durand LPN.   I discussed the limitations, risks, security and privacy concerns of performing an evaluation and management service by telephone and the availability of in person appointments. I also discussed with the patient that there may be a patient responsible charge related to this service. The patient expressed understanding and verbally consented to this telephonic visit.    Interactive audio and video telecommunications were attempted between this provider and patient, however failed, due to patient having technical difficulties OR patient did not have access to video capability.  We continued and completed visit with audio only.     Vital signs may be patient reported or missing.  Subjective:   [name] is a 75 y.o. female who presents for Medicare Annual (Subsequent) preventive examination.  Review of Systems     Cardiac Risk Factors include: advanced age (>47men, >63 women);diabetes mellitus;obesity (BMI >30kg/m2);smoking/ tobacco exposure     Objective:    Today's Vitals   03/21/21 1132  Weight: 213 lb (96.6 kg)  Height: 5\' 7"  (1.702 m)   Body mass index is 33.36 kg/m.  Advanced Directives 03/21/2021 09/29/2020 03/02/2020 11/30/2019 10/15/2019 02/25/2019 05/16/2013  Does Patient Have a Medical Advance Directive? Yes Yes Yes Yes Yes Yes Patient has advance directive, copy not in chart  Type of Advance Directive [place];Living will [place];Living will [place];Living will [place];Living will [place];Living will Living will [place]  Does patient want to make changes to medical advance directive? - - - - - -  No  Copy of [place] in Chart? No - copy requested - No - copy requested - No - copy requested - Copy requested from family  Pre-existing out of facility DNR order (yellow form or pink MOST form) - - - - - - No    Current Medications (verified) Outpatient Encounter Medications as of 03/21/2021  Medication Sig   acetaminophen (TYLENOL) 500 MG tablet Take 1,000 mg by mouth 3 (three) times daily as needed for pain.   BD PEN NEEDLE NANO U/F 32G X 4 MM MISC USE AS DIRECTED WITH INSULIN PENS   betamethasone dipropionate 0.05 % cream Apply 1 application topically 2 (two) times daily as needed (dry skin).   Cholecalciferol (VITAMIN D) 2000 UNITS CAPS Take 2,000 Units by mouth daily.   diclofenac Sodium (VOLTAREN) 1 % GEL Apply 4 g topically 4 (four) times daily. (Patient taking differently: Apply 4 g topically 4 (four) times daily as needed (pain).)   digoxin (LANOXIN) 0.125 MG tablet TAKE 1 TABLET BY MOUTH DAILY (Patient taking differently: Take 0.125 mg by mouth daily.)   ELIQUIS 5 MG TABS tablet TAKE 1 TABLET BY MOUTH TWICE A DAY   enalapril (VASOTEC) 5 MG tablet Take 1 tablet (5 mg total) by mouth at bedtime.   fluticasone (FLONASE) 50 MCG/ACT nasal spray PLACE 1 SPRAY INTO BOTH NOSTRILS DAILY. (Patient taking differently: Place 1 spray into both nostrils daily as needed for allergies.)   furosemide (LASIX) 40 MG tablet TAKE 1 TABLET BY MOUITH DAILY AS NEEDED FOR EDEMA FOR FLUID OVERLOAD (Patient taking differently: Take 40 mg by mouth daily. May take additional dose if swelling)   KLOR-CON M20 20 MEQ tablet TAKE  1 TABLET (20 MEQ TOTAL) BY MOUTH DAILY. WHEN TAKING FUROSEMIDE (LASIX). (Patient taking differently: Take 20 mEq by mouth daily as needed. When taking double  furosemide (Lasix).)   liraglutide (VICTOZA) 18 MG/3ML SOPN INJECT 1.2 MG UNDER THE SKIN ONCE DAILY   Multiple Vitamin (MULITIVITAMIN WITH MINERALS) TABS Take 1 tablet by mouth daily. Centrum Silver   nystatin  cream (MYCOSTATIN) Apply 1 application topically 2 (two) times daily as needed for dry skin (under breasts).   ONETOUCH VERIO test strip USE AS DIRECTED TO CHECK BLOOD SUGARS 1 TIME PER DAY DX: E11.22   pantoprazole (PROTONIX) 20 MG tablet Take 1 tablet (20 mg total) by mouth daily as needed for heartburn.   Pitavastatin Calcium (LIVALO) 2 MG TABS Take 1 tablet (2 mg total) by mouth daily.   pneumococcal 20-valent conjugate vaccine (PREVNAR 20) 0.5 ML injection Inject 0.5 mLs into the muscle tomorrow at 10 am for 1 dose.   vitamin C (ASCORBIC ACID) 500 MG tablet Take 500 mg by mouth daily.   metoprolol succinate (TOPROL-XL) 50 MG 24 hr tablet TAKE 1 TABLET (50 MG TOTAL) BY MOUTH DAILY. TAKE WITH OR IMMEDIATELY FOLLOWING A MEAL.   No facility-administered encounter medications on file as of 03/21/2021.    Allergies (verified) Codeine, Augmentin [amoxicillin-pot clavulanate], Coreg [carvedilol], and Warfarin and related   History: Past Medical History:  Diagnosis Date   Arthritis    "left knee" (03/12/2013)   Atrial fibrillation ([place])    Cardioverted to NSR 08/22/2009   Atrial tachycardia ([place]) 09/30/2019   Cardiomyopathy    CHF (congestive heart failure) (HCC)    CKD (chronic kidney disease)    Depression    Dizziness    DM2 (diabetes mellitus, type 2) (HCC)    fasting 80-100 (03/12/2013)   Drug therapy 1/11   coumadin   Ejection fraction < 50%    20-25%, echo.06/2009, /  EF 30-35%, diffuse hypokinesis, echo, Oct 16, 2010   GERD (gastroesophageal reflux disease)    Gout    "left knee" (03/12/2013)   History of bronchitis    History of emphysema    HLD (hyperlipidemia)    Hyperkalemia    Hypertension    LFT elevation    06/2009   Mitral regurgitation    mild - echo - 1/11 /  Mild, echo, May15, 2012   Overweight(278.02)    Pneumonia    "@ least twice" (03/12/2013)   Thyroid dysfunction    thyromegally,diffuse,nodule LLL,06/2009   Tobacco abuse    Tricuspid regurgitation     moderate - echo - 1/11   Past Surgical History:  Procedure Laterality Date   BIOPSY  09/29/2020   Procedure: BIOPSY;  Surgeon: Carol Ada, MD;  Location: [facility];  Service: Endoscopy;;   BREAST BIOPSY Right 1980's   BREAST LUMPECTOMY Right 1980's   CARDIOVERSION N/A 10/15/2019   Procedure: CARDIOVERSION;  Surgeon: [name], MD;  Location: [facility];  Service: Cardiovascular;  Laterality: N/A;   CATARACT EXTRACTION W/ INTRAOCULAR LENS  IMPLANT, BILATERAL Bilateral 2010-2012   CHOLECYSTECTOMY  ~ 1978   ESOPHAGOGASTRODUODENOSCOPY (EGD) WITH PROPOFOL N/A 09/29/2020   Procedure: ESOPHAGOGASTRODUODENOSCOPY (EGD) WITH PROPOFOL;  Surgeon: Carol Ada, MD;  Location: [facility];  Service: Endoscopy;  Laterality: N/A;   JOINT REPLACEMENT     TONSILLECTOMY  1960's   TOTAL KNEE ARTHROPLASTY Left 03/12/2013   TOTAL KNEE ARTHROPLASTY Left 03/12/2013   Procedure: TOTAL KNEE ARTHROPLASTY;  Surgeon: Newt Minion, MD;  Location: [facility];  Service: Orthopedics;  Laterality: Left;  Left total knee arthroplasty   TUBAL LIGATION     UPPER ESOPHAGEAL ENDOSCOPIC ULTRASOUND (EUS) N/A 09/29/2020   Procedure: UPPER ESOPHAGEAL ENDOSCOPIC ULTRASOUND (EUS);  Surgeon: Carol Ada, MD;  Location: [facility];  Service: Endoscopy;  Laterality: N/A;   VAGINAL HYSTERECTOMY  1987   Family History  Problem Relation Age of Onset   Stroke Mother    Prostate cancer Father    Colon cancer Other    Heart disease Other    Heart attack Son    Hypertension Sister    Hypertension Son        X2   Stroke Maternal Grandfather    Stroke Paternal Grandmother    Social History   Socioeconomic History   Marital status: Divorced    Spouse name: Not on file   Number of children: Not on file   Years of education: Not on file   Highest education level: Not on file  Occupational History   Occupation: retired  Tobacco Use   Smoking status: Every Day    Packs/day: 0.50    Years: 40.00    Pack years:  20.00    Types: Cigarettes   Smokeless tobacco: Never   Tobacco comments:    smoked 1.5ppd x 9, 1ppdx40, cut back to 1/2ppd past 5 years  Vaping Use   Vaping Use: Never used  Substance and Sexual Activity   Alcohol use: No   Drug use: No   Sexual activity: Not Currently  Other Topics Concern   Not on file  Social History Narrative   Not on file   Social Determinants of Health   Financial Resource Strain: Low Risk    Difficulty of Paying Living Expenses: Not hard at all  Food Insecurity: No Food Insecurity   Worried About Charity fundraiser in the Last Year: Never true   [place] in the Last Year: Never true  Transportation Needs: No Transportation Needs   Lack of Transportation (Medical): No   Lack of Transportation (Non-Medical): No  Physical Activity: Insufficiently Active   Days of Exercise per Week: 7 days   Minutes of Exercise per Session: 20 min  Stress: No Stress Concern Present   Feeling of Stress : Not at all  Social Connections: Not on file    Tobacco Counseling Ready to quit: Yes Counseling given: Not Answered Tobacco comments: smoked 1.5ppd x 9, 1ppdx40, cut back to 1/2ppd past 5 years   Clinical Intake:  Pre-visit preparation completed: Yes  Pain : No/denies pain     Nutritional Status: BMI > 30  Obese Nutritional Risks: None Diabetes: Yes  How often do you need to have someone help you when you read instructions, pamphlets, or other written materials from your doctor or pharmacy?: 1 - Never What is the last grade level you completed in school?: 8yrs college  Diabetic? Yes Nutrition Risk Assessment:  Has the patient had any N/V/D within the last 2 months?  No  Does the patient have any non-healing wounds?  No  Has the patient had any unintentional weight loss or weight gain?  No   Diabetes:  Is the patient diabetic?  Yes  If diabetic, was a CBG obtained today?  No  Did the patient bring in their glucometer from home?  No  How  often do you monitor your CBG's? daily.   Financial Strains and Diabetes Management:  Are you having any financial strains with the device, your supplies or your medication?  No .  Does the patient want to be seen by Chronic Care Management for management of their diabetes?  No  Would the patient like to be referred to a Nutritionist or for Diabetic Management?  No   Diabetic Exams:  Diabetic Eye Exam: Completed 11/15/2020 Diabetic Foot Exam: Completed 03/14/2021   Interpreter Needed?: No  Information entered by :: NAllen LPN   Activities of Daily Living In your present state of health, do you have any difficulty performing the following activities: 03/21/2021 03/14/2021  Hearing? N N  Vision? N N  Difficulty concentrating or making decisions? N N  Walking or climbing stairs? N Y  Dressing or bathing? N N  Doing errands, shopping? N N  Preparing Food and eating ? N -  Using the Toilet? N -  In the past six months, have you accidently leaked urine? Y -  Comment wears pads -  Do you have problems with loss of bowel control? N -  Managing your Medications? N -  Managing your Finances? N -  Housekeeping or managing your Housekeeping? N -  Some recent data might be hidden    Patient Care Team: [name], MD as PCP - General (Internal Medicine) [name], MD as PCP - Cardiology (Cardiology) Carol Ada, MD as Consulting Physician (Gastroenterology)  Indicate any recent Medical Services you may have received from other than [facility] providers in the past year (date may be approximate).     Assessment:   This is a routine wellness examination for [facility].  Hearing/Vision screen Vision Screening - Comments:: Regular eye exams,  Dietary issues and exercise activities discussed: Current Exercise Habits: Home exercise routine, Type of exercise: walking, Time (Minutes): 15, Frequency (Times/Week): 7, Weekly Exercise (Minutes/Week): 105   Goals Addressed              This Visit's Progress    Patient Stated       03/21/2021, wants to get to 200 pounds or below       Depression Screen PHQ 2/9 Scores 03/21/2021 03/14/2021 03/02/2020 02/25/2019 02/04/2019 11/30/2018 08/03/2018  PHQ - 2 Score 0 0 0 0 0 0 0  PHQ- 9 Score - - - 0 - - -    Fall Risk Fall Risk  03/21/2021 03/14/2021 03/02/2020 03/02/2020 02/25/2019  Falls in the past year? 0 0 1 1 0  Comment - - blacked out - -  Number falls in past yr: - 0 0 0 -  Injury with Fall? - 0 0 1 -  Comment - - - hurt right hand and arm -  Risk for fall due to : Medication side effect No Fall Risks Medication side effect - Medication side effect  Follow up Falls evaluation completed;Education provided;Falls prevention discussed Falls evaluation completed Falls evaluation completed;Education provided;Falls prevention discussed - Falls evaluation completed;Education provided;Falls prevention discussed    FALL RISK PREVENTION PERTAINING TO THE HOME:  Any stairs in or around the home? Yes  If so, are there any without handrails? Yes  Home free of loose throw rugs in walkways, pet beds, electrical cords, etc? Yes  Adequate lighting in your home to reduce risk of falls? Yes   ASSISTIVE DEVICES UTILIZED TO PREVENT FALLS:  Life alert? No  Use of a cane, walker or w/c? No  Grab bars in the bathroom? Yes  Shower chair or bench in shower? Yes  Elevated toilet seat or a handicapped toilet? Yes   TIMED UP AND GO:  Was the test performed? No .  Cognitive Function:     6CIT Screen 03/21/2021 03/02/2020 02/25/2019  What Year? 0 points 0 points 0 points  What month? 0 points 0 points 0 points  What time? 0 points 0 points 0 points  Count back from 20 0 points 2 points 0 points  Months in reverse 0 points 0 points 0 points  Repeat phrase 2 points 2 points 0 points  Total Score 2 4 0    Immunizations Immunization History  Administered Date(s) Administered   Fluad Quad(high Dose 65+) 03/07/2020, 03/14/2021    Influenza, High Dose Seasonal PF 04/29/2018, 03/11/2019   Influenza,inj,Quad PF,6+ Mos 03/13/2013   PFIZER Comirnaty(Gray Top)Covid-19 Tri-Sucrose Vaccine 10/27/2020   PFIZER(Purple Top)SARS-COV-2 Vaccination 07/19/2019, 08/10/2019, 03/28/2020   Pneumococcal Conjugate-13 02/28/2019   Pneumococcal-Unspecified 08/01/2011   Zoster Recombinat (Shingrix) 10/04/2013, 03/02/2019    TDAP status: Up to date  Flu Vaccine status: Up to date  Pneumococcal vaccine status: Due, Education has been provided regarding the importance of this vaccine. Advised may receive this vaccine at local pharmacy or Health Dept. Aware to provide a copy of the vaccination record if obtained from local pharmacy or Health Dept. Verbalized acceptance and understanding.  Covid-19 vaccine status: Completed vaccines  Qualifies for Shingles Vaccine? Yes   Zostavax completed No   Shingrix Completed?: Yes  Screening Tests Health Maintenance  Topic Date Due   Pneumonia Vaccine 79+ Years old (2 - PPSV23 or PCV20) 02/28/2020   COVID-19 Vaccine (5 - Booster for Pfizer series) 02/27/2021   HEMOGLOBIN A1C  09/12/2021   OPHTHALMOLOGY EXAM  11/15/2021   FOOT EXAM  03/14/2022   TETANUS/TDAP  06/18/2022   COLONOSCOPY (Pts 45-17yrs Insurance coverage will need to be confirmed)  09/15/2023   INFLUENZA VACCINE  Completed   DEXA SCAN  Completed   Hepatitis C Screening  Completed   Zoster Vaccines- Shingrix  Completed   HPV VACCINES  Aged Out    Health Maintenance  Health Maintenance Due  Topic Date Due   Pneumonia Vaccine 78+ Years old (2 - PPSV23 or PCV20) 02/28/2020   COVID-19 Vaccine (5 - Booster for [place] series) 02/27/2021    Colorectal cancer screening: Type of screening: Colonoscopy. Completed 09/14/2020. Repeat every 3 years  Mammogram status: Completed 08/24/2020. Repeat every year  Bone Density status: Completed 03/23/2015.  Lung Cancer Screening: (Low Dose CT Chest recommended if Age 21-80 years, 30 pack-year  currently smoking OR have quit w/in 15years.) does qualify.   Lung Cancer Screening Referral: CT scan 04/11/2020  Additional Screening:  Hepatitis C Screening: does qualify; Completed 06/28/2009  Vision Screening: Recommended annual ophthalmology exams for early detection of glaucoma and other disorders of the eye. Is the patient up to date with their annual eye exam?  Yes  Who is the provider or what is the name of the office in which the patient attends annual eye exams? Can't remember name If pt is not established with a provider, would they like to be referred to a provider to establish care? No .   Dental Screening: Recommended annual dental exams for proper oral hygiene  Community Resource Referral / Chronic Care Management: CRR required this visit?  No   CCM required this visit?  No      Plan:     I have personally reviewed and noted the following in the patient's chart:   Medical and social history Use of alcohol, tobacco or illicit drugs  Current medications and supplements including opioid prescriptions.  Functional ability and status Nutritional status  Physical activity Advanced directives List of other physicians Hospitalizations, surgeries, and ER visits in previous 12 months Vitals Screenings to include cognitive, depression, and falls Referrals and appointments  In addition, I have reviewed and discussed with patient certain preventive protocols, quality metrics, and best practice recommendations. A written personalized care plan for preventive services as well as general preventive health recommendations were provided to patient.     Kellie Simmering, LPN   [id number]   Nurse Notes:

## 2021-03-28 ENCOUNTER — Other Ambulatory Visit: Payer: Self-pay | Admitting: Cardiovascular Disease

## 2021-04-03 ENCOUNTER — Other Ambulatory Visit: Payer: Self-pay | Admitting: Cardiovascular Disease

## 2021-04-03 NOTE — Telephone Encounter (Signed)
Rx(s) sent to pharmacy electronically.  

## 2021-04-09 ENCOUNTER — Other Ambulatory Visit: Payer: Self-pay

## 2021-04-09 ENCOUNTER — Ambulatory Visit (HOSPITAL_BASED_OUTPATIENT_CLINIC_OR_DEPARTMENT_OTHER): Payer: Medicare Other | Admitting: Cardiovascular Disease

## 2021-04-09 ENCOUNTER — Encounter (HOSPITAL_BASED_OUTPATIENT_CLINIC_OR_DEPARTMENT_OTHER): Payer: Self-pay | Admitting: Cardiovascular Disease

## 2021-04-09 VITALS — BP 102/66 | HR 79 | Ht 67.0 in | Wt 219.6 lb

## 2021-04-09 DIAGNOSIS — I480 Paroxysmal atrial fibrillation: Secondary | ICD-10-CM | POA: Diagnosis not present

## 2021-04-09 DIAGNOSIS — Z720 Tobacco use: Secondary | ICD-10-CM | POA: Diagnosis not present

## 2021-04-09 DIAGNOSIS — I5042 Chronic combined systolic (congestive) and diastolic (congestive) heart failure: Secondary | ICD-10-CM | POA: Insufficient documentation

## 2021-04-09 DIAGNOSIS — E7800 Pure hypercholesterolemia, unspecified: Secondary | ICD-10-CM | POA: Diagnosis not present

## 2021-04-09 DIAGNOSIS — I10 Essential (primary) hypertension: Secondary | ICD-10-CM | POA: Diagnosis not present

## 2021-04-09 DIAGNOSIS — I428 Other cardiomyopathies: Secondary | ICD-10-CM | POA: Diagnosis not present

## 2021-04-09 HISTORY — DX: Chronic combined systolic (congestive) and diastolic (congestive) heart failure: I50.42

## 2021-04-09 NOTE — Patient Instructions (Signed)
Medication Instructions:  Your physician recommends that you continue on your current medications as directed. Please refer to the Current Medication list given to you today.   *If you need a refill on your cardiac medications before your next appointment, please call your pharmacy*  Lab Work: NONE  Testing/Procedures: NONE  Follow-Up: At [facility], you and your health needs are our priority.  As part of our continuing mission to provide you with exceptional heart care, we have created designated Provider Care Teams.  These Care Teams include your primary Cardiologist (physician) and Advanced Practice Providers (APPs -  Physician Assistants and Nurse Practitioners) who all work together to provide you with the care you need, when you need it.  We recommend signing up for the patient portal called "MyChart".  Sign up information is provided on this After Visit Summary.  MyChart is used to connect with patients for Virtual Visits (Telemedicine).  Patients are able to view lab/test results, encounter notes, upcoming appointments, etc.  Non-urgent messages can be sent to your provider as well.   To learn more about what you can do with MyChart, go to [email or web address].    Your next appointment:   6 month(s)  The format for your next appointment:   In Person  Provider:   Tiffany [place], MD            

## 2021-04-09 NOTE — Progress Notes (Signed)
Cardiology Office  Note   Evaluation Performed:  Follow-up visit  Date:  04/09/2021   ID:  [name], DOB 1946-03-01, MRN [id number]  PCP:  [name], MD  Cardiologist:  [name], MD  Electrophysiologist:  None   Chief Complaint:  Heart failure  History of Present Illness:    [name] is a 75 y.o. female with chronic systolic and diastolic heart failure (LVEF 25 to 30%, nonischemic), PAF, diabetes, hyperlipidemia, hypertension, and tobacco abuse here for follow up.  She was previously a patient of Dr. Ron Parker and then Dr. Irish Lack.  She was first diagnosed with heart failure 06/2009.  At that time her LVEF was 25%.  This was associated with atrial fibrillation.  She reports having an ablation with Dr. [name] in 1999, though those records are not available at this time.  She has undergone cardioversions for atrial fibrillation with Dr. Ron Parker.  LVEF has ranged from 25 to 35% but has not improved despite beta-blockers and ACE inhibitor.  She had an echo 11/2011 that revealed LVEF 50 to 55%.  However subsequent studies have ranged from 20-35%.  LVEF on Lexiscan Myoview 09/2014 was 41%.  She developed a left bundle branch block pattern in 2014.  Her cardiomyopathy is nonischemic.  She last saw Dr. Irish Lack 04/2018 and carvedilol was increased due to concern for atrial tachycardia.  She felt fatigued and shaky at the higher dose.  Her ACE inhibitor was reduced 2/2 CKD III.  Prior to that she called the office 12/2017 with volume overload.  She previously discussed ICD with Dr. Irish Lack but was not interested.  We continued this discussion and she continues to want to think about it.  She was considering CRT-D after coronavirus.  Carvedilol was switched to metoprolol due to hypotension and tachycardia.  Her heart rate remained elevated but her energy levels improved.  Digoxin was started due to poor heart rate control and heart failure.  [name] had a recent episode of syncope.   She was referred for an ambulatory monitor which revealed atrial fibrillation with PVCs.  She saw Dr. [name] on 4/6 who thought she was actually in atrial tachycardia.  He recommended waiting on ICD to see if her LVEF  Improved and felt she was unlikely to benefit from CRT.  After her last appointment Ms. Moscoso underwent successful DCCV for atrial tachycardia on 10/15/19.  Since her last appointment she has been struggling with pain in her right hand.  She is working with Dr. Baird Cancer on that.  From a cardiac standpoint she has been doing well.  Her heart rate has been consistently in the 50s-60s since cardioversion.  She no longer feels like she will pass out.  She denies lower extremity edema, orthopnea or PND.  Her weight has been stable. She recently had a lung screening CT that showed coronary calcification and emphysema.  She is motivated to quit smoking.  She continues to smoke about half pack daily but is trying to cut back.  She isn't interested in any medications.  Lately she has her good days and bad days.  Her breathing is stable and she denies chest pain.  She walks to and from her mailbox and around the house more.  She does this a couple times per day.  She has no edema, orthopnea or PND.  She checks her BP daily every morning.  Some mornings her BP is as low as 104.    Lately [name] has been doing  well.  She saw Dr. Baird Cancer and her BP was elevated.  She has been checking it at home and her BP has been stable.  She has been active but isn't getting formal exercise.  She has no exertional chest pain or shortness of breath.  She has no LE edema, orthopnea or PND.  She will be going to see a nephrologist for worsening renal function.  She hasn't noticed any palpitations.  She is working on tobacco cessation.  She stopped smoking one day ago and plans to keep working on it.     Past Medical History:  Diagnosis Date   Arthritis    "left knee" (03/12/2013)   Atrial fibrillation ([place])     Cardioverted to NSR 08/22/2009   Atrial tachycardia ([place]) 09/30/2019   Cardiomyopathy    CHF (congestive heart failure) (HCC)    CKD (chronic kidney disease)    Depression    Dizziness    DM2 (diabetes mellitus, type 2) (HCC)    fasting 80-100 (03/12/2013)   Drug therapy 1/11   coumadin   Ejection fraction < 50%    20-25%, echo.06/2009, /  EF 30-35%, diffuse hypokinesis, echo, Oct 16, 2010   GERD (gastroesophageal reflux disease)    Gout    "left knee" (03/12/2013)   History of bronchitis    History of emphysema    HLD (hyperlipidemia)    Hyperkalemia    Hypertension    LFT elevation    06/2009   Mitral regurgitation    mild - echo - 1/11 /  Mild, echo, May15, 2012   Overweight(278.02)    Pneumonia    "@ least twice" (03/12/2013)   Thyroid dysfunction    thyromegally,diffuse,nodule LLL,06/2009   Tobacco abuse    Tricuspid regurgitation    moderate - echo - 1/11   Past Surgical History:  Procedure Laterality Date   BIOPSY  09/29/2020   Procedure: BIOPSY;  Surgeon: Carol Ada, MD;  Location: [facility];  Service: Endoscopy;;   BREAST BIOPSY Right 1980's   BREAST LUMPECTOMY Right 1980's   CARDIOVERSION N/A 10/15/2019   Procedure: CARDIOVERSION;  Surgeon: [name], MD;  Location: [facility];  Service: Cardiovascular;  Laterality: N/A;   CATARACT EXTRACTION W/ INTRAOCULAR LENS  IMPLANT, BILATERAL Bilateral 2010-2012   CHOLECYSTECTOMY  ~ 1978   ESOPHAGOGASTRODUODENOSCOPY (EGD) WITH PROPOFOL N/A 09/29/2020   Procedure: ESOPHAGOGASTRODUODENOSCOPY (EGD) WITH PROPOFOL;  Surgeon: Carol Ada, MD;  Location: [facility];  Service: Endoscopy;  Laterality: N/A;   JOINT REPLACEMENT     TONSILLECTOMY  1960's   TOTAL KNEE ARTHROPLASTY Left 03/12/2013   TOTAL KNEE ARTHROPLASTY Left 03/12/2013   Procedure: TOTAL KNEE ARTHROPLASTY;  Surgeon: Newt Minion, MD;  Location: [facility];  Service: Orthopedics;  Laterality: Left;  Left total knee arthroplasty   TUBAL LIGATION      UPPER ESOPHAGEAL ENDOSCOPIC ULTRASOUND (EUS) N/A 09/29/2020   Procedure: UPPER ESOPHAGEAL ENDOSCOPIC ULTRASOUND (EUS);  Surgeon: Carol Ada, MD;  Location: [facility];  Service: Endoscopy;  Laterality: N/A;   VAGINAL HYSTERECTOMY  1987     Current Meds  Medication Sig   acetaminophen (TYLENOL) 500 MG tablet Take 1,000 mg by mouth 3 (three) times daily as needed for pain.   BD PEN NEEDLE NANO U/F 32G X 4 MM MISC USE AS DIRECTED WITH INSULIN PENS   betamethasone dipropionate 0.05 % cream Apply 1 application topically 2 (two) times daily as needed (dry skin).   Cholecalciferol (VITAMIN D) 2000 UNITS CAPS Take 2,000 Units by mouth  daily.   diclofenac Sodium (VOLTAREN) 1 % GEL Apply 4 g topically 4 (four) times daily. (Patient taking differently: Apply 4 g topically 4 (four) times daily as needed (pain).)   digoxin (LANOXIN) 0.125 MG tablet TAKE 1 TABLET BY MOUTH DAILY (Patient taking differently: Take 0.125 mg by mouth daily.)   ELIQUIS 5 MG TABS tablet TAKE 1 TABLET BY MOUTH TWICE A DAY   enalapril (VASOTEC) 5 MG tablet Take 1 tablet (5 mg total) by mouth at bedtime.   fluticasone (FLONASE) 50 MCG/ACT nasal spray PLACE 1 SPRAY INTO BOTH NOSTRILS DAILY. (Patient taking differently: Place 1 spray into both nostrils daily as needed for allergies.)   furosemide (LASIX) 40 MG tablet TAKE 1 TABLET BY MOUITH DAILY AS NEEDED FOR EDEMA FOR FLUID OVERLOAD. PLEASE KEEP UPCOMING APPOINTMENT IN NOVEMBER 2022 FOR FUTURE REFILLS. THANK YOU   KLOR-CON M20 20 MEQ tablet TAKE 1 TABLET (20 MEQ TOTAL) BY MOUTH DAILY. WHEN TAKING FUROSEMIDE (LASIX). (Patient taking differently: Take 20 mEq by mouth daily as needed. When taking double  furosemide (Lasix).)   liraglutide (VICTOZA) 18 MG/3ML SOPN INJECT 1.2 MG UNDER THE SKIN ONCE DAILY   LIVALO 2 MG TABS TAKE 1 TABLET BY MOUTH EVERY DAY   Multiple Vitamin (MULITIVITAMIN WITH MINERALS) TABS Take 1 tablet by mouth daily. Centrum Silver   nystatin cream (MYCOSTATIN)  Apply 1 application topically 2 (two) times daily as needed for dry skin (under breasts).   ONETOUCH VERIO test strip USE AS DIRECTED TO CHECK BLOOD SUGARS 1 TIME PER DAY DX: E11.22   pantoprazole (PROTONIX) 20 MG tablet Take 1 tablet (20 mg total) by mouth daily as needed for heartburn.   vitamin C (ASCORBIC ACID) 500 MG tablet Take 500 mg by mouth daily.     Allergies:   Codeine, Augmentin [amoxicillin-pot clavulanate], Coreg [carvedilol], and Warfarin and related   Social History   Tobacco Use   Smoking status: Every Day    Packs/day: 0.50    Years: 40.00    Pack years: 20.00    Types: Cigarettes   Smokeless tobacco: Never   Tobacco comments:    smoked 1.5ppd x 9, 1ppdx40, cut back to 1/2ppd past 5 years  Vaping Use   Vaping Use: Never used  Substance Use Topics   Alcohol use: No   Drug use: No     Family Hx: The patient's family history includes Colon cancer in an other family member; Heart attack in her son; Heart disease in an other family member; Hypertension in her sister and son; Prostate cancer in her father; Stroke in her maternal grandfather, mother, and paternal grandmother.  ROS:   Please see the history of present illness.     All other systems reviewed and are negative.   Prior CV studies:   The following studies were reviewed today:  Echo 07/28/15: Study Conclusions   - Left ventricle: The cavity size was normal. There was moderate   concentric hypertrophy. Systolic function was severely reduced.   The estimated ejection fraction was in the range of 25% to 30%.   Diffuse hypokinesis. Features are consistent with a pseudonormal   left ventricular filling pattern, with concomitant abnormal   relaxation and increased filling pressure (grade 2 diastolic   dysfunction). Doppler parameters are consistent with high   ventricular filling pressure. - Aortic valve: Transvalvular velocity was within the normal range.   There was no stenosis. There was no  regurgitation. - Mitral valve: Calcified annulus. Mildly thickened leaflets .  There was trivial regurgitation. - Left atrium: The atrium was mildly dilated. - Right ventricle: The cavity size was normal. Wall thickness was   normal. Systolic function was normal. - Atrial septum: A patent foramen ovale cannot be excluded. - Tricuspid valve: There was mild regurgitation. Peak RV-RA   gradient (S): 30 mm Hg. - Inferior vena cava: The vessel was normal in size. The   respirophasic diameter changes were in the normal range (= 50%),   consistent with normal central venous pressure.  Lexiscan Myoview 09/08/14: LVEF 41%.  No ischemia.  Echo 03/07/20:  1. Since the prior study on 02/23/2019 LVEF has increased from 30-35% to  40-45%.   2. There is a false tending in the left ventricular apex. Left  ventricular ejection fraction, by estimation, is 40 to 45%. The left  ventricle has mildly decreased function. The left ventricle demonstrates  global hypokinesis. Left ventricular diastolic  parameters are consistent with Grade II diastolic dysfunction  (pseudonormalization). Elevated left atrial pressure. The average left  ventricular global longitudinal strain is -13.7 %. The global longitudinal  strain is abnormal.   3. Right ventricular systolic function is normal. The right ventricular  size is normal.   4. Left atrial size was moderately dilated.   5. The mitral valve is normal in structure. Mild mitral valve  regurgitation. No evidence of mitral stenosis. Severe mitral annular  calcification.   6. The aortic valve is normal in structure. There is mild calcification  of the aortic valve. There is mild thickening of the aortic valve. Aortic  valve regurgitation is not visualized. Mild to moderate aortic valve  sclerosis/calcification is present,  without any evidence of aortic stenosis.   7. The inferior vena cava is normal in size with greater than 50%  respiratory variability, suggesting  right atrial pressure of 3 mmHg.   Labs/Other Tests and Data Reviewed:    EKG:  An ECG dated 04/22/19 was personally reviewed today and demonstrated:  Ectopic atrial rhythm.  Rate 95 bpm.  PVCs.  IVCD.  QRS 130 ms.   Recent Labs: 03/14/2021: ALT 13; BUN 31; Creatinine, Ser 1.48; Hemoglobin 12.9; Platelets 227; Potassium 5.4; Sodium 143   Recent Lipid Panel Lab Results  Component Value Date/Time   CHOL 131 03/14/2021 03:43 PM   TRIG 140 03/14/2021 03:43 PM   HDL 36 (L) 03/14/2021 03:43 PM   CHOLHDL 3.6 03/14/2021 03:43 PM   CHOLHDL 4.8 06/27/2009 03:28 AM   LDLCALC 70 03/14/2021 03:43 PM    Wt Readings from Last 3 Encounters:  04/09/21 219 lb 9.6 oz (99.6 kg)  03/21/21 213 lb (96.6 kg)  03/14/21 219 lb 3.2 oz (99.4 kg)     Objective:    VS:  BP 102/66 (BP Location: Left Arm, Patient Position: Sitting, Cuff Size: Large)   Pulse 79   Ht 5\' 7"  (1.702 m)   Wt 219 lb 9.6 oz (99.6 kg)   SpO2 98%   BMI 34.39 kg/m  , BMI Body mass index is 34.39 kg/m. GENERAL:  Well appearing HEENT: Pupils equal round and reactive, fundi not visualized, oral mucosa unremarkable NECK:  No jugular venous distention, waveform within normal limits, carotid upstroke brisk and symmetric, no bruits LUNGS:  Clear to auscultation bilaterally HEART:  RRR.  PMI not displaced or sustained,S1 and S2 within normal limits, no S3, no S4, no clicks, no rubs, no murmurs ABD:  Flat, positive bowel sounds normal in frequency in pitch, no bruits, no rebound, no guarding, no  midline pulsatile mass, no hepatomegaly, no splenomegaly EXT:  2 plus pulses throughout, no edema, no cyanosis no clubbing SKIN:  No rashes no nodules NEURO:  Cranial nerves II through XII grossly intact, motor grossly intact throughout PSYCH:  Cognitively intact, oriented to person place and time   ASSESSMENT & PLAN:    # Syncope:  Improved since she is no longer in atrial tachycardia.   # Atrial tachycardia;  Resolved.  Continue  metoprolol and digoxin.  She will come for a digoxin level at the same time when she gets her lipids checked.  # Acute on chronic systolic and diastolic heart failure: NICM.  LVEF has improved to 40 to 45%.  Continue metoprolol and enalapril.  Continue digoxin. She is euvolemic and doing well.    # Asymptomatic CAD: # Hyperlipidemia: Noted on chest CT.  Continue pitavastatin. LDL was 70 on 03/2021.  # Persistent atrial fibrillation: H/o atrial fibrillation s/p remote ablation.  Continue metoprolol, Eliquis, and digoxin.  No recurrent atrial fibrillation.    # Tobacco abuse: Continue working on smoking cessation.    Medication Adjustments/Labs and Tests Ordered: Current medicines are reviewed at length with the patient today.  Concerns regarding medicines are outlined above.   Tests Ordered: No orders of the defined types were placed in this encounter.   Medication Changes: No orders of the defined types were placed in this encounter.   Disposition:  Follow up in 6 month(s)  Signed, [name], MD  04/09/2021 3:50 PM    [facility]

## 2021-04-11 DIAGNOSIS — N189 Chronic kidney disease, unspecified: Secondary | ICD-10-CM | POA: Diagnosis not present

## 2021-04-11 DIAGNOSIS — I129 Hypertensive chronic kidney disease with stage 1 through stage 4 chronic kidney disease, or unspecified chronic kidney disease: Secondary | ICD-10-CM | POA: Diagnosis not present

## 2021-04-11 DIAGNOSIS — E785 Hyperlipidemia, unspecified: Secondary | ICD-10-CM | POA: Diagnosis not present

## 2021-04-11 DIAGNOSIS — I5020 Unspecified systolic (congestive) heart failure: Secondary | ICD-10-CM | POA: Diagnosis not present

## 2021-04-11 DIAGNOSIS — N1832 Chronic kidney disease, stage 3b: Secondary | ICD-10-CM | POA: Diagnosis not present

## 2021-04-11 DIAGNOSIS — E1122 Type 2 diabetes mellitus with diabetic chronic kidney disease: Secondary | ICD-10-CM | POA: Diagnosis not present

## 2021-05-05 ENCOUNTER — Other Ambulatory Visit: Payer: Self-pay | Admitting: Internal Medicine

## 2021-05-09 ENCOUNTER — Telehealth: Payer: Self-pay

## 2021-05-09 NOTE — Telephone Encounter (Signed)
The pt was advised to go to the ER for bilateral leg swelling, the [facility] called and said that the pt rescheduled her appt for her ABI out to the end of December. The pt said that she wasn't going to the ER and that she would be ok.

## 2021-05-10 ENCOUNTER — Other Ambulatory Visit: Payer: Medicare Other

## 2021-05-10 ENCOUNTER — Encounter (HOSPITAL_COMMUNITY): Payer: Medicare Other

## 2021-05-10 ENCOUNTER — Ambulatory Visit: Payer: Medicare Other

## 2021-05-13 ENCOUNTER — Other Ambulatory Visit: Payer: Self-pay | Admitting: Internal Medicine

## 2021-05-25 ENCOUNTER — Other Ambulatory Visit: Payer: Self-pay | Admitting: Internal Medicine

## 2021-05-30 ENCOUNTER — Other Ambulatory Visit: Payer: Self-pay | Admitting: Cardiovascular Disease

## 2021-06-01 ENCOUNTER — Ambulatory Visit (HOSPITAL_COMMUNITY): Payer: Medicare Other | Attending: Internal Medicine

## 2021-06-01 ENCOUNTER — Ambulatory Visit: Payer: Medicare Other

## 2021-06-01 ENCOUNTER — Other Ambulatory Visit: Payer: Medicare Other

## 2021-06-11 ENCOUNTER — Other Ambulatory Visit: Payer: Self-pay

## 2021-06-11 ENCOUNTER — Other Ambulatory Visit: Payer: Self-pay | Admitting: Internal Medicine

## 2021-06-11 MED ORDER — FUROSEMIDE 40 MG PO TABS: ORAL_TABLET | ORAL | 0 refills | Status: DC

## 2021-06-25 ENCOUNTER — Other Ambulatory Visit: Payer: Self-pay

## 2021-06-25 MED ORDER — LIVALO 2 MG PO TABS: 1.0000 | ORAL_TABLET | Freq: Every day | ORAL | 2 refills | Status: DC

## 2021-06-29 ENCOUNTER — Ambulatory Visit
Admission: RE | Admit: 2021-06-29 | Discharge: 2021-06-29 | Disposition: A | Discharge status: Home / Self Care | Payer: Medicare Other | Source: Ambulatory Visit | Attending: Internal Medicine | Admitting: Internal Medicine

## 2021-06-29 DIAGNOSIS — E1122 Type 2 diabetes mellitus with diabetic chronic kidney disease: Secondary | ICD-10-CM | POA: Diagnosis not present

## 2021-06-29 DIAGNOSIS — F17210 Nicotine dependence, cigarettes, uncomplicated: Secondary | ICD-10-CM | POA: Diagnosis not present

## 2021-06-29 DIAGNOSIS — Z720 Tobacco use: Secondary | ICD-10-CM

## 2021-06-29 DIAGNOSIS — J9859 Other diseases of mediastinum, not elsewhere classified: Secondary | ICD-10-CM | POA: Diagnosis not present

## 2021-06-29 DIAGNOSIS — J432 Centrilobular emphysema: Secondary | ICD-10-CM | POA: Diagnosis not present

## 2021-06-29 DIAGNOSIS — Z794 Long term (current) use of insulin: Secondary | ICD-10-CM | POA: Diagnosis not present

## 2021-06-29 DIAGNOSIS — I2510 Atherosclerotic heart disease of native coronary artery without angina pectoris: Secondary | ICD-10-CM | POA: Diagnosis not present

## 2021-07-01 ENCOUNTER — Other Ambulatory Visit: Payer: Self-pay | Admitting: Internal Medicine

## 2021-07-01 DIAGNOSIS — Z720 Tobacco use: Secondary | ICD-10-CM

## 2021-07-16 ENCOUNTER — Other Ambulatory Visit: Payer: Self-pay

## 2021-07-16 ENCOUNTER — Ambulatory Visit (INDEPENDENT_AMBULATORY_CARE_PROVIDER_SITE_OTHER): Payer: Medicare Other | Admitting: Internal Medicine

## 2021-07-16 ENCOUNTER — Encounter: Payer: Self-pay | Admitting: Internal Medicine

## 2021-07-16 VITALS — BP 112/66 | HR 78 | Temp 97.8°F | Ht 67.0 in | Wt 214.8 lb

## 2021-07-16 DIAGNOSIS — I700 Atherosclerosis of aorta: Secondary | ICD-10-CM | POA: Insufficient documentation

## 2021-07-16 DIAGNOSIS — I130 Hypertensive heart and chronic kidney disease with heart failure and stage 1 through stage 4 chronic kidney disease, or unspecified chronic kidney disease: Secondary | ICD-10-CM | POA: Diagnosis not present

## 2021-07-16 DIAGNOSIS — M79672 Pain in left foot: Secondary | ICD-10-CM

## 2021-07-16 DIAGNOSIS — F17210 Nicotine dependence, cigarettes, uncomplicated: Secondary | ICD-10-CM

## 2021-07-16 DIAGNOSIS — N1832 Chronic kidney disease, stage 3b: Secondary | ICD-10-CM | POA: Diagnosis not present

## 2021-07-16 DIAGNOSIS — Z794 Long term (current) use of insulin: Secondary | ICD-10-CM

## 2021-07-16 DIAGNOSIS — I5042 Chronic combined systolic (congestive) and diastolic (congestive) heart failure: Secondary | ICD-10-CM

## 2021-07-16 DIAGNOSIS — I480 Paroxysmal atrial fibrillation: Secondary | ICD-10-CM | POA: Diagnosis not present

## 2021-07-16 DIAGNOSIS — N2581 Secondary hyperparathyroidism of renal origin: Secondary | ICD-10-CM

## 2021-07-16 DIAGNOSIS — E1122 Type 2 diabetes mellitus with diabetic chronic kidney disease: Secondary | ICD-10-CM | POA: Diagnosis not present

## 2021-07-16 DIAGNOSIS — Z6833 Body mass index (BMI) 33.0-33.9, adult: Secondary | ICD-10-CM

## 2021-07-16 DIAGNOSIS — E6609 Other obesity due to excess calories: Secondary | ICD-10-CM

## 2021-07-16 NOTE — Progress Notes (Signed)
I,[name],acting as a Education administrator for Maximino Greenland, MD.,have documented all relevant documentation on the behalf of Maximino Greenland, MD,as directed by  Maximino Greenland, MD while in the presence of Maximino Greenland, MD.  This visit occurred during the SARS-CoV-2 public health emergency.  Safety protocols were in place, including screening questions prior to the visit, additional usage of staff PPE, and extensive cleaning of exam room while observing appropriate contact time as indicated for disinfecting solutions.  Subjective:     Patient ID: [name] , female    DOB: 05-Nov-1945 , 76 y.o.   MRN: [id number]   Chief Complaint  Patient presents with   Diabetes   Hypertension    HPI  The patient is here for a follow-up on her diabetes and blood pressure. She reports compliance with meds. She denies headaches, chest pain and shortness of breath. She admits she is not getting much exercise. She has no new issues or concerns at this time.   Diabetes She presents for her follow-up diabetic visit. She has type 2 diabetes mellitus. Her disease course has been stable. There are no hypoglycemic associated symptoms. Pertinent negatives for diabetes include no blurred vision. There are no hypoglycemic complications. Diabetic complications include nephropathy. Risk factors for coronary artery disease include diabetes mellitus, dyslipidemia, hypertension, obesity, post-menopausal and sedentary lifestyle. Meal planning includes avoidance of concentrated sweets. She participates in exercise intermittently. An ACE inhibitor/angiotensin II receptor blocker is being taken. Eye exam is current.  Hypertension This is a chronic problem. The current episode started more than 1 year ago. The problem has been gradually improving since onset. The problem is controlled. Pertinent negatives include no blurred vision. Risk factors for coronary artery disease include diabetes mellitus, dyslipidemia, obesity,  post-menopausal state and sedentary lifestyle. The current treatment provides moderate improvement. Compliance problems include exercise.     Past Medical History:  Diagnosis Date   Arthritis    "left knee" (03/12/2013)   Atrial fibrillation ([place])    Cardioverted to NSR 08/22/2009   Atrial tachycardia ([place]) 09/30/2019   Cardiomyopathy    CHF (congestive heart failure) (HCC)    Chronic combined systolic and diastolic heart failure ([place]) 04/09/2021   CKD (chronic kidney disease)    Depression    Dizziness    DM2 (diabetes mellitus, type 2) (HCC)    fasting 80-100 (03/12/2013)   Drug therapy 1/11   coumadin   Ejection fraction < 50%    20-25%, echo.06/2009, /  EF 30-35%, diffuse hypokinesis, echo, Oct 16, 2010   GERD (gastroesophageal reflux disease)    Gout    "left knee" (03/12/2013)   History of bronchitis    History of emphysema    HLD (hyperlipidemia)    Hyperkalemia    Hypertension    LFT elevation    06/2009   Mitral regurgitation    mild - echo - 1/11 /  Mild, echo, May15, 2012   Overweight(278.02)    Pneumonia    "@ least twice" (03/12/2013)   Thyroid dysfunction    thyromegally,diffuse,nodule LLL,06/2009   Tobacco abuse    Tricuspid regurgitation    moderate - echo - 1/11     Family History  Problem Relation Age of Onset   Stroke Mother    Prostate cancer Father    Colon cancer Other    Heart disease Other    Heart attack Son    Hypertension Sister    Hypertension Son  X2   Stroke Maternal Grandfather    Stroke Paternal Grandmother      Current Outpatient Medications:    acetaminophen (TYLENOL) 500 MG tablet, Take 1,000 mg by mouth 3 (three) times daily as needed for pain., Disp: , Rfl:    BD PEN NEEDLE NANO U/F 32G X 4 MM MISC, USE AS DIRECTED WITH INSULIN PENS, Disp: 100 each, Rfl: 11   betamethasone dipropionate 0.05 % cream, Apply 1 application topically 2 (two) times daily as needed (dry skin)., Disp: 15 g, Rfl: 0   Cholecalciferol (VITAMIN D)  2000 UNITS CAPS, Take 2,000 Units by mouth daily., Disp: , Rfl:    diclofenac Sodium (VOLTAREN) 1 % GEL, Apply 4 g topically 4 (four) times daily. (Patient taking differently: Apply 4 g topically 4 (four) times daily as needed (pain).), Disp: 100 g, Rfl: 0   digoxin (LANOXIN) 0.125 MG tablet, TAKE 1 TABLET BY MOUTH DAILY (Patient taking differently: Take 0.125 mg by mouth daily.), Disp: 90 tablet, Rfl: 3   ELIQUIS 5 MG TABS tablet, TAKE 1 TABLET BY MOUTH TWICE A DAY, Disp: 180 tablet, Rfl: 1   enalapril (VASOTEC) 5 MG tablet, TAKE 1 TABLET BY MOUTH EVERYDAY AT BEDTIME, Disp: 90 tablet, Rfl: 3   furosemide (LASIX) 40 MG tablet, TAKE 1 TABLET BY MOUITH DAILY AS NEEDED FOR EDEMA FOR FLUID OVERLOAD. PLEASE KEEP UPCOMING APPOINTMENT IN NOVEMBER 2022 FOR FUTURE REFILLS. THANK YOU, Disp: 90 tablet, Rfl: 0   KLOR-CON M20 20 MEQ tablet, TAKE 1 TABLET (20 MEQ TOTAL) BY MOUTH DAILY. WHEN TAKING FUROSEMIDE (LASIX). (Patient taking differently: Take 20 mEq by mouth daily as needed. When taking double  furosemide (Lasix).), Disp: 90 tablet, Rfl: 2   Multiple Vitamin (MULITIVITAMIN WITH MINERALS) TABS, Take 1 tablet by mouth daily. Centrum Silver, Disp: , Rfl:    nystatin cream (MYCOSTATIN), Apply 1 application topically 2 (two) times daily as needed for dry skin (under breasts)., Disp: , Rfl:    ONETOUCH VERIO test strip, USE AS DIRECTED TO CHECK BLOOD SUGARS 1 TIME PER DAY DX: E11.22, Disp: 50 strip, Rfl: 7   pantoprazole (PROTONIX) 20 MG tablet, TAKE 1 TABLET BY MOUTH DAILY AS NEEDED FOR HEARTBURN., Disp: 90 tablet, Rfl: 1   Pitavastatin Calcium (LIVALO) 2 MG TABS, Take 1 tablet (2 mg total) by mouth daily., Disp: 90 tablet, Rfl: 2   VICTOZA 18 MG/3ML SOPN, INJECT 1.2 MG UNDER THE SKIN ONCE DAILY, Disp: 3 mL, Rfl: 3   vitamin C (ASCORBIC ACID) 500 MG tablet, Take 500 mg by mouth daily., Disp: , Rfl:    fluticasone (FLONASE) 50 MCG/ACT nasal spray, PLACE 1 SPRAY INTO BOTH NOSTRILS DAILY. (Patient taking  differently: Place 1 spray into both nostrils daily as needed for allergies.), Disp: 16 mL, Rfl: 2   metoprolol succinate (TOPROL-XL) 50 MG 24 hr tablet, TAKE 1 TABLET (50 MG TOTAL) BY MOUTH DAILY. TAKE WITH OR IMMEDIATELY FOLLOWING A MEAL., Disp: 90 tablet, Rfl: 3   Allergies  Allergen Reactions   Codeine Hives and Nausea And Vomiting   Augmentin [Amoxicillin-Pot Clavulanate] Diarrhea   Coreg [Carvedilol]     No energy   Warfarin And Related     Severe rectal bleeding     Review of Systems  Constitutional: Negative.   Eyes:  Negative for blurred vision.  Respiratory: Negative.    Cardiovascular: Negative.   Gastrointestinal: Negative.   Musculoskeletal:  Positive for arthralgias.       She c/o left foot pain. There is  some pain with ambulation. She denies fall/trauma.   Neurological: Negative.   Psychiatric/Behavioral: Negative.      Today's Vitals   07/16/21 1407  BP: 112/66  Pulse: 78  Temp: 97.8 F (36.6 C)  Weight: 214 lb 12.8 oz (97.4 kg)  Height: '5\' 7"'  (1.702 m)  PainSc: 10-Worst pain ever  PainLoc: Foot   Body mass index is 33.64 kg/m.  Wt Readings from Last 3 Encounters:  07/16/21 214 lb 12.8 oz (97.4 kg)  04/09/21 219 lb 9.6 oz (99.6 kg)  03/21/21 213 lb (96.6 kg)    BP Readings from Last 3 Encounters:  07/16/21 112/66  04/09/21 102/66  03/14/21 128/74    Objective:  Physical Exam Vitals and nursing note reviewed.  Constitutional:      Appearance: Normal appearance.  HENT:     Head: Normocephalic and atraumatic.     Nose:     Comments: Masked     Mouth/Throat:     Comments: Masked  Cardiovascular:     Rate and Rhythm: Normal rate and regular rhythm.     Heart sounds: Normal heart sounds.  Pulmonary:     Effort: Pulmonary effort is normal.     Breath sounds: Normal breath sounds.  Musculoskeletal:     Cervical back: Normal range of motion.  Skin:    General: Skin is warm.  Neurological:     General: No focal deficit present.     Mental  Status: She is alert.  Psychiatric:        Mood and Affect: Mood normal.        Behavior: Behavior normal.        Assessment And Plan:     1. Type 2 diabetes mellitus with stage 3b chronic kidney disease, with long-term current use of insulin (HCC) Comments: Chronic,  Importance of medication/dietary compliance was discussed with the patient. I will refer her to Podiatry for diabetic foot care.  - Hemoglobin A1c - BMP8+EGFR - Ambulatory referral to Podiatry - PTH, intact and calcium - Phosphorus - Protein electrophoresis, serum  2. Hypertensive heart and renal disease with renal failure, stage 1 through stage 4 or unspecified chronic kidney disease, with heart failure ([place]) Comments: Chronic, well controlled. NO med changes. Encouraged to follow low sodium diet.   3. Chronic combined systolic and diastolic heart failure (HCC) Comments: Chronic, importance of salt restriction and dietary/medication compliance was stressed with the patient.   4. Atherosclerosis of aorta ([place]) Comments: Chronic, encouraged to follow heart healthy lifestyle. Importance of statin compliance was also d/w pt. LDL goal <70.   5. Paroxysmal atrial fibrillation (HCC) Comments: Chronic, intermittent. She is properly anticoagulated and rate controlled.   6. Secondary hyperparathyroidism of renal origin [facility]) Comments: I will check PTH today. She is also followed by Renal.   7. Left foot pain - Ambulatory referral to Podiatry  8. Cigarette nicotine dependence without complication Comments: WE discussed risks associated with continued tobacco use. She is encouraged to decrease number of cigs smoked/day. WE also reviewed recent low dose CT scan.   9. Class 1 obesity due to excess calories with serious comorbidity and body mass index (BMI) of 33.0 to 33.9 in adult Comments:  She is encouraged to strive for BMI less than 30 to decrease cardiac risk. Advised to aim for at least 150 minutes of exercise per week.    Patient was given opportunity to ask questions. Patient verbalized understanding of the plan and was able to repeat key elements of  the plan. All questions were answered to their satisfaction.   I, Maximino Greenland, MD, have reviewed all documentation for this visit. The documentation on 07/16/21 for the exam, diagnosis, procedures, and orders are all accurate and complete.   IF YOU HAVE BEEN REFERRED TO A SPECIALIST, IT MAY TAKE 1-2 WEEKS TO SCHEDULE/PROCESS THE REFERRAL. IF YOU HAVE NOT HEARD FROM US/SPECIALIST IN TWO WEEKS, PLEASE GIVE [place] A CALL AT [phone].   THE PATIENT IS ENCOURAGED TO PRACTICE SOCIAL DISTANCING DUE TO THE COVID-19 PANDEMIC.

## 2021-07-16 NOTE — Patient Instructions (Signed)

## 2021-07-18 LAB — BMP8+EGFR
BUN/Creatinine Ratio: 24 (ref 12–28)
BUN: 45 mg/dL — ABNORMAL HIGH (ref 8–27)
CO2: 24 mmol/L (ref 20–29)
Calcium: 9.9 mg/dL (ref 8.7–10.3)
Chloride: 98 mmol/L (ref 96–106)
Creatinine, Ser: 1.84 mg/dL — ABNORMAL HIGH (ref 0.57–1.00)
Glucose: 98 mg/dL (ref 70–99)
Potassium: 5 mmol/L (ref 3.5–5.2)
Sodium: 140 mmol/L (ref 134–144)
eGFR: 28 mL/min/{1.73_m2} — ABNORMAL LOW (ref >59)

## 2021-07-18 LAB — PROTEIN ELECTROPHORESIS, SERUM
A/G Ratio: 0.9 (ref 0.7–1.7)
Albumin ELP: 3.5 g/dL (ref 2.9–4.4)
Alpha 1: 0.4 g/dL (ref 0.0–0.4)
Alpha 2: 1.2 g/dL — ABNORMAL HIGH (ref 0.4–1.0)
Beta: 1.1 g/dL (ref 0.7–1.3)
Gamma Globulin: 1.1 g/dL (ref 0.4–1.8)
Globulin, Total: 3.8 g/dL (ref 2.2–3.9)
Total Protein: 7.3 g/dL (ref 6.0–8.5)

## 2021-07-18 LAB — PTH, INTACT AND CALCIUM: PTH: 118 pg/mL — ABNORMAL HIGH (ref 15–65)

## 2021-07-18 LAB — PHOSPHORUS: Phosphorus: 4.1 mg/dL (ref 3.0–4.3)

## 2021-07-18 LAB — HEMOGLOBIN A1C
Est. average glucose Bld gHb Est-mCnc: 143 mg/dL
Hgb A1c MFr Bld: 6.6 % — ABNORMAL HIGH (ref 4.8–5.6)

## 2021-08-01 ENCOUNTER — Other Ambulatory Visit: Payer: Self-pay | Admitting: Cardiovascular Disease

## 2021-08-01 NOTE — Telephone Encounter (Signed)
Call to patient who stated she is taking Digoxin as prescribed.  ?

## 2021-08-07 ENCOUNTER — Ambulatory Visit: Payer: Medicare Other | Admitting: Podiatry

## 2021-08-13 ENCOUNTER — Ambulatory Visit: Payer: Medicare Other | Admitting: Podiatry

## 2021-08-13 ENCOUNTER — Other Ambulatory Visit: Payer: Self-pay

## 2021-08-13 DIAGNOSIS — M79674 Pain in right toe(s): Secondary | ICD-10-CM | POA: Diagnosis not present

## 2021-08-13 DIAGNOSIS — M79675 Pain in left toe(s): Secondary | ICD-10-CM

## 2021-08-13 DIAGNOSIS — B351 Tinea unguium: Secondary | ICD-10-CM | POA: Diagnosis not present

## 2021-08-13 DIAGNOSIS — E1151 Type 2 diabetes mellitus with diabetic peripheral angiopathy without gangrene: Secondary | ICD-10-CM

## 2021-08-13 DIAGNOSIS — M10372 Gout due to renal impairment, left ankle and foot: Secondary | ICD-10-CM | POA: Diagnosis not present

## 2021-08-19 NOTE — Progress Notes (Signed)
?  Subjective:  ?Patient ID: [name], female    DOB: 02-10-1946,  MRN: [id number] ? ?Chief Complaint  ?Patient presents with  ? Nail Problem  ?   (np) left foot pain;diabetic foot care  ? ? ?76 y.o. female presents with the above complaint. History confirmed with patient.  She said having left foot pain for about 2 weeks.  Has not had gout before.  She takes Eliquis.  Both nails were previously removed on the great toes with airplane back. ? ?Objective:  ?Physical Exam: ?warm, good capillary refill, DP reduced bilateral, no trophic changes or ulcerative lesions, normal sensory exam, and PT reduced bilateral. ?Left Foot: dystrophic yellowed discolored nail plates with subungual debris, mild edema and pain ?Right Foot: dystrophic yellowed discolored nail plates with subungual debris ? ?No images are attached to the encounter. ? ?Assessment:  ? ?1. Acute gout due to renal impairment involving left foot   ?2. Diabetes mellitus with peripheral angiopathy ([place])   ? ? ? ?Plan:  ?Patient was evaluated and treated and all questions answered. ? ?Patient educated on diabetes. Discussed proper diabetic foot care and discussed risks and complications of disease. Educated patient in depth on reasons to return to the office immediately should he/she discover anything concerning or new on the feet. All questions answered. Discussed proper shoes as well.  ? ?Discussed the etiology and treatment options for the condition in detail with the patient. Educated patient on the topical and oral treatment options for mycotic nails. Recommended debridement of the nails today. Sharp and mechanical debridement performed of all painful and mycotic nails today. Nails debrided in length and thickness using a nail nipper to level of comfort. Discussed treatment options including appropriate shoe gear. Follow up as needed for painful nails. ? ? ? ?Discussed with her that I think she likely has gout of the left foot.  I ordered her a uric acid  and CBC to evaluate.  Vascular testing was also ordered. ? ?No follow-ups on file.  ? ?

## 2021-08-27 ENCOUNTER — Telehealth: Payer: Self-pay

## 2021-08-27 NOTE — Telephone Encounter (Signed)
Patient called saying she was feeling sick, lightheaded and dizziness.  ? ?I returned her call and she stated she was feeling fine now after taking a nap. Pt was advised that if she has those symptoms again she is advised to give [place] a call or go to the emergency room. YL,RMA ?

## 2021-09-06 ENCOUNTER — Other Ambulatory Visit: Payer: Self-pay | Admitting: Internal Medicine

## 2021-09-06 ENCOUNTER — Other Ambulatory Visit: Payer: Self-pay | Admitting: Cardiovascular Disease

## 2021-09-10 ENCOUNTER — Ambulatory Visit (HOSPITAL_COMMUNITY)
Admission: RE | Admit: 2021-09-10 | Discharge: 2021-09-10 | Disposition: A | Discharge status: Home / Self Care | Payer: Medicare Other | Source: Ambulatory Visit | Attending: Cardiology | Admitting: Cardiology

## 2021-09-10 ENCOUNTER — Other Ambulatory Visit: Payer: Self-pay | Admitting: Podiatry

## 2021-09-10 DIAGNOSIS — E1151 Type 2 diabetes mellitus with diabetic peripheral angiopathy without gangrene: Secondary | ICD-10-CM

## 2021-09-11 ENCOUNTER — Encounter: Payer: Self-pay | Admitting: Internal Medicine

## 2021-09-15 ENCOUNTER — Other Ambulatory Visit: Payer: Self-pay | Admitting: Internal Medicine

## 2021-09-19 ENCOUNTER — Telehealth: Payer: Self-pay | Admitting: Cardiovascular Disease

## 2021-09-19 NOTE — Telephone Encounter (Signed)
Pt. Requesting to speak with you  ? ?

## 2021-09-19 NOTE — Telephone Encounter (Signed)
Spoke with regarding upcoming appointment with Dr Gwenlyn Found and explained she would be seeing him for vascular issues only. Reassured her Dr [name] refers to him as well and she would remain her cardiologist  ?Patient appreciated return call and will forward to Dr [name] so she will be aware.  ?

## 2021-09-19 NOTE — Telephone Encounter (Signed)
?  Pt is calling and requesting to speak with Dr. [name] nurse. She said its regarding her appt with the Dr. Gwenlyn Found ?

## 2021-09-24 ENCOUNTER — Ambulatory Visit: Payer: Medicare Other | Admitting: Podiatry

## 2021-09-25 ENCOUNTER — Ambulatory Visit: Payer: Medicare Other | Admitting: Cardiovascular Disease

## 2021-09-25 ENCOUNTER — Encounter: Payer: Self-pay | Admitting: Cardiovascular Disease

## 2021-09-25 VITALS — BP 130/68 | HR 112 | Ht 68.0 in | Wt 207.0 lb

## 2021-09-25 DIAGNOSIS — I739 Peripheral vascular disease, unspecified: Secondary | ICD-10-CM | POA: Diagnosis not present

## 2021-09-25 DIAGNOSIS — I480 Paroxysmal atrial fibrillation: Secondary | ICD-10-CM

## 2021-09-25 NOTE — Assessment & Plan Note (Signed)
[name] was referred to me by Dr. Sherryle Lis for evaluation of PAD.  She is a cardiology patient of Dr. Blenda Mounts.  Her vascular risk factor profile is notable for treated hypertension, diabetes and hyperlipidemia.  She does still smoke.  She complains of pain in her left foot when she places weight on it but really denies symptoms of claudication.  She had Doppler studies performed in our office 09/10/2021 revealing noncompressible ABIs bilaterally with a high-frequency signal in the mid right common femoral artery.  Circulation in her left leg looked nonobstructive except for a mild narrowing in her left SFA and proximal left anterior tibial artery.  Her posterior tibial and peroneal were occluded.  There is no wounds or evidence of critical ischemia.  I do not think her symptoms are vascular in nature.  I suggested that she go back to her podiatrist, Dr. Sherryle Lis, for further evaluation. ?

## 2021-09-25 NOTE — Patient Instructions (Signed)

## 2021-09-25 NOTE — Progress Notes (Signed)
? ? ? ?09/25/2021 ?[name]   ?1946-04-18  ?[id number] ? ?Primary Physician [name], MD ?Primary Cardiologist: Lorretta Harp MD Lupe Carney, [place] ? ?HPI:  [name] is a 76 y.o. moderately overweight divorced African-American female mother of 2 children, grandmother of 4 granddaughters who was referred by Dr. Lanae Crumbly, podiatrist, for evaluation of PAD.  She retired 15 years ago from working for [employer] of [place].  She is a cardiology patient of Dr. Blenda Mounts.  Her cardiovascular risk factor profile is notable for treated hypertension, diabetes and hyperlipidemia.  She continues to smoke 10 cigarettes a day.  There is no family history for heart disease.  She is never had a heart attack or stroke.  She does get some shortness of breath but denies chest pain.  She has persistent A-fib on Eliquis oral anticoagulation.  She has had a history of ablation in the past and multiple cardioversions.  She does have LV dysfunction, with an EF in the 30 to 35% range.  She had Doppler studies performed in our office/10/23 that revealed noncompressible ABIs with a high-frequency signal in her mid right common femoral artery.  She had an occluded right posterior tibial artery as well as tibial vessel disease on the left. ? ? ?Current Meds  ?Medication Sig  ? acetaminophen (TYLENOL) 500 MG tablet Take 1,000 mg by mouth 3 (three) times daily as needed for pain.  ? BD PEN NEEDLE NANO U/F 32G X 4 MM MISC USE AS DIRECTED WITH INSULIN PENS  ? betamethasone dipropionate 0.05 % cream Apply 1 application topically 2 (two) times daily as needed (dry skin).  ? Cholecalciferol (VITAMIN D) 2000 UNITS CAPS Take 2,000 Units by mouth daily.  ? diclofenac Sodium (VOLTAREN) 1 % GEL Apply 4 g topically 4 (four) times daily. (Patient taking differently: Apply 4 g topically 4 (four) times daily as needed (pain).)  ? digoxin (LANOXIN) 0.125 MG tablet TAKE 1 TABLET BY MOUTH EVERY DAY  ? ELIQUIS 5 MG TABS tablet TAKE 1  TABLET BY MOUTH TWICE A DAY  ? enalapril (VASOTEC) 5 MG tablet TAKE 1 TABLET BY MOUTH EVERYDAY AT BEDTIME  ? fluticasone (FLONASE) 50 MCG/ACT nasal spray PLACE 1 SPRAY INTO BOTH NOSTRILS DAILY. (Patient taking differently: Place 1 spray into both nostrils daily as needed for allergies.)  ? furosemide (LASIX) 40 MG tablet TAKE 1 TABLET DAILY AS NEEDED FOR EDEMA FOR FLUID OVERLOAD. PLEASE KEEP UPCOMING APPOINTMENT  ? KLOR-CON M20 20 MEQ tablet TAKE 1 TABLET (20 MEQ TOTAL) BY MOUTH DAILY. WHEN TAKING FUROSEMIDE (LASIX). (Patient taking differently: Take 20 mEq by mouth daily as needed. When taking double  furosemide (Lasix).)  ? metoprolol succinate (TOPROL-XL) 50 MG 24 hr tablet TAKE 1 TABLET BY MOUTH DAILY. TAKE WITH OR IMMEDIATELY FOLLOWING A MEAL.  ? Multiple Vitamin (MULITIVITAMIN WITH MINERALS) TABS Take 1 tablet by mouth daily. Centrum Silver  ? nystatin cream (MYCOSTATIN) Apply 1 application topically 2 (two) times daily as needed for dry skin (under breasts).  ? ONETOUCH VERIO test strip USE AS DIRECTED TO CHECK BLOOD SUGARS 1 TIME PER DAY DX: E11.22  ? pantoprazole (PROTONIX) 20 MG tablet TAKE 1 TABLET BY MOUTH DAILY AS NEEDED FOR HEARTBURN.  ? Pitavastatin Calcium (LIVALO) 2 MG TABS Take 1 tablet (2 mg total) by mouth daily.  ? VICTOZA 18 MG/3ML SOPN INJECT 1.2 MG UNDER THE SKIN ONCE DAILY  ? vitamin C (ASCORBIC ACID) 500 MG tablet Take 500 mg by mouth daily.  ?  ? ?  Allergies  ?Allergen Reactions  ? Codeine Hives and Nausea And Vomiting  ? Augmentin [Amoxicillin-Pot Clavulanate] Diarrhea  ? Coreg [Carvedilol]   ?  No energy  ? Warfarin And Related   ?  Severe rectal bleeding  ? ? ?Social History  ? ?Socioeconomic History  ? Marital status: Divorced  ?  Spouse name: Not on file  ? Number of children: Not on file  ? Years of education: Not on file  ? Highest education level: Not on file  ?Occupational History  ? Occupation: retired  ?Tobacco Use  ? Smoking status: Every Day  ?  Packs/day: 0.50  ?  Years: 40.00   ?  Pack years: 20.00  ?  Types: Cigarettes  ? Smokeless tobacco: Never  ? Tobacco comments:  ?  smoked 1.5ppd x 9, 1ppdx40, cut back to 1/2ppd past 5 years  ?Vaping Use  ? Vaping Use: Never used  ?Substance and Sexual Activity  ? Alcohol use: No  ? Drug use: No  ? Sexual activity: Not Currently  ?Other Topics Concern  ? Not on file  ?Social History Narrative  ? Not on file  ? ?Social Determinants of Health  ? ?Financial Resource Strain: Low Risk   ? Difficulty of Paying Living Expenses: Not hard at all  ?Food Insecurity: No Food Insecurity  ? Worried About Charity fundraiser in the Last Year: Never true  ? Ran Out of Food in the Last Year: Never true  ?Transportation Needs: No Transportation Needs  ? Lack of Transportation (Medical): No  ? Lack of Transportation (Non-Medical): No  ?Physical Activity: Insufficiently Active  ? Days of Exercise per Week: 7 days  ? Minutes of Exercise per Session: 20 min  ?Stress: No Stress Concern Present  ? Feeling of Stress : Not at all  ?Social Connections: Not on file  ?Intimate Partner Violence: Not on file  ?  ? ?Review of Systems: ?General: negative for chills, fever, night sweats or weight changes.  ?Cardiovascular: negative for chest pain, dyspnea on exertion, edema, orthopnea, palpitations, paroxysmal nocturnal dyspnea or shortness of breath ?Dermatological: negative for rash ?Respiratory: negative for cough or wheezing ?Urologic: negative for hematuria ?Abdominal: negative for nausea, vomiting, diarrhea, bright red blood per rectum, melena, or hematemesis ?Neurologic: negative for visual changes, syncope, or dizziness ?All other systems reviewed and are otherwise negative except as noted above. ? ? ? ?Blood pressure 130/68, pulse (!) 112, height '5\' 8"'$  (1.727 m), weight 207 lb (93.9 kg), SpO2 96 %.  ?General appearance: alert and no distress ?Neck: no adenopathy, no carotid bruit, no JVD, supple, symmetrical, trachea midline, and thyroid not enlarged, symmetric, no  tenderness/mass/nodules ?Lungs: clear to auscultation bilaterally ?Heart: irregularly irregular rhythm ?Extremities: extremities normal, atraumatic, no cyanosis or edema ?Pulses: Diminished pedal pulses ?Skin: Skin color, texture, turgor normal. No rashes or lesions ?Neurologic: Grossly normal ? ?EKG atrial fibrillation with a nonspecific IVCD, low limb voltage and left axis deviation with septal Q waves.  I personally reviewed this EKG. ? ?ASSESSMENT AND PLAN:  ? ?Peripheral arterial disease ([place]) ?Ms. Faerber was referred to me by Dr. Sherryle Lis for evaluation of PAD.  She is a cardiology patient of Dr. Blenda Mounts.  Her vascular risk factor profile is notable for treated hypertension, diabetes and hyperlipidemia.  She does still smoke.  She complains of pain in her left foot when she places weight on it but really denies symptoms of claudication.  She had Doppler studies performed in our office 09/10/2021 revealing noncompressible  ABIs bilaterally with a high-frequency signal in the mid right common femoral artery.  Circulation in her left leg looked nonobstructive except for a mild narrowing in her left SFA and proximal left anterior tibial artery.  Her posterior tibial and peroneal were occluded.  There is no wounds or evidence of critical ischemia.  I do not think her symptoms are vascular in nature.  I suggested that she go back to her podiatrist, Dr. Sherryle Lis, for further evaluation. ? ? ? ? ?Lorretta Harp MD FACP,FACC,FAHA, FSCAI ?09/25/2021 ?2:45 PM ?

## 2021-10-23 ENCOUNTER — Encounter: Payer: Self-pay | Admitting: Internal Medicine

## 2021-10-23 NOTE — Progress Notes (Unsigned)
Office Note     CC:  Rest pain in LLE Requesting Provider:  [name], MD  HPI: [name] is a 76 y.o. ([birth year]) female presenting at the request of .[name], MD ***  The pt is *** on a statin for cholesterol management.  The pt is *** on a daily aspirin.   Other AC:  *** The pt is *** on medication for hypertension.   The pt is *** diabetic.  Tobacco hx:  ***  Past Medical History:  Diagnosis Date   Arthritis    "left knee" (03/12/2013)   Atrial fibrillation ([place])    Cardioverted to NSR 08/22/2009   Atrial tachycardia ([place]) 09/30/2019   Cardiomyopathy    CHF (congestive heart failure) (HCC)    Chronic combined systolic and diastolic heart failure ([place]) 04/09/2021   CKD (chronic kidney disease)    Depression    Dizziness    DM2 (diabetes mellitus, type 2) (HCC)    fasting 80-100 (03/12/2013)   Drug therapy 1/11   coumadin   Ejection fraction < 50%    20-25%, echo.06/2009, /  EF 30-35%, diffuse hypokinesis, echo, Oct 16, 2010   GERD (gastroesophageal reflux disease)    Gout    "left knee" (03/12/2013)   History of bronchitis    History of emphysema    HLD (hyperlipidemia)    Hyperkalemia    Hypertension    LFT elevation    06/2009   Mitral regurgitation    mild - echo - 1/11 /  Mild, echo, May15, 2012   Overweight(278.02)    Pneumonia    "@ least twice" (03/12/2013)   Thyroid dysfunction    thyromegally,diffuse,nodule LLL,06/2009   Tobacco abuse    Tricuspid regurgitation    moderate - echo - 1/11    Past Surgical History:  Procedure Laterality Date   BIOPSY  09/29/2020   Procedure: BIOPSY;  Surgeon: Carol Ada, MD;  Location: [facility];  Service: Endoscopy;;   BREAST BIOPSY Right 1980's   BREAST LUMPECTOMY Right 1980's   CARDIOVERSION N/A 10/15/2019   Procedure: CARDIOVERSION;  Surgeon: [name], MD;  Location: [place];  Service: Cardiovascular;  Laterality: N/A;   CATARACT EXTRACTION W/ INTRAOCULAR LENS  IMPLANT,  BILATERAL Bilateral 2010-2012   CHOLECYSTECTOMY  ~ 1978   ESOPHAGOGASTRODUODENOSCOPY (EGD) WITH PROPOFOL N/A 09/29/2020   Procedure: ESOPHAGOGASTRODUODENOSCOPY (EGD) WITH PROPOFOL;  Surgeon: Carol Ada, MD;  Location: [facility];  Service: Endoscopy;  Laterality: N/A;   JOINT REPLACEMENT     TONSILLECTOMY  1960's   TOTAL KNEE ARTHROPLASTY Left 03/12/2013   TOTAL KNEE ARTHROPLASTY Left 03/12/2013   Procedure: TOTAL KNEE ARTHROPLASTY;  Surgeon: Newt Minion, MD;  Location: [place];  Service: Orthopedics;  Laterality: Left;  Left total knee arthroplasty   TUBAL LIGATION     UPPER ESOPHAGEAL ENDOSCOPIC ULTRASOUND (EUS) N/A 09/29/2020   Procedure: UPPER ESOPHAGEAL ENDOSCOPIC ULTRASOUND (EUS);  Surgeon: Carol Ada, MD;  Location: [facility];  Service: Endoscopy;  Laterality: N/A;   VAGINAL HYSTERECTOMY  1987    Social History   Socioeconomic History   Marital status: Divorced    Spouse name: Not on file   Number of children: Not on file   Years of education: Not on file   Highest education level: Not on file  Occupational History   Occupation: retired  Tobacco Use   Smoking status: Every Day    Packs/day: 0.50    Years: 40.00    Pack years: 20.00    Types:  Cigarettes   Smokeless tobacco: Never   Tobacco comments:    smoked 1.5ppd x 9, 1ppdx40, cut back to 1/2ppd past 5 years  Vaping Use   Vaping Use: Never used  Substance and Sexual Activity   Alcohol use: No   Drug use: No   Sexual activity: Not Currently  Other Topics Concern   Not on file  Social History Narrative   Not on file   Social Determinants of Health   Financial Resource Strain: Low Risk    Difficulty of Paying Living Expenses: Not hard at all  Food Insecurity: No Food Insecurity   Worried About Charity fundraiser in the Last Year: Never true   Ran Out of Food in the Last Year: Never true  Transportation Needs: No Transportation Needs   Lack of Transportation (Medical): No   Lack of Transportation  (Non-Medical): No  Physical Activity: Insufficiently Active   Days of Exercise per Week: 7 days   Minutes of Exercise per Session: 20 min  Stress: No Stress Concern Present   Feeling of Stress : Not at all  Social Connections: Not on file  Intimate Partner Violence: Not on file   *** Family History  Problem Relation Age of Onset   Stroke Mother    Prostate cancer Father    Colon cancer Other    Heart disease Other    Heart attack Son    Hypertension Sister    Hypertension Son        X2   Stroke Maternal Grandfather    Stroke Paternal Grandmother     Current Outpatient Medications  Medication Sig Dispense Refill   acetaminophen (TYLENOL) 500 MG tablet Take 1,000 mg by mouth 3 (three) times daily as needed for pain.     BD PEN NEEDLE NANO U/F 32G X 4 MM MISC USE AS DIRECTED WITH INSULIN PENS 100 each 11   betamethasone dipropionate 0.05 % cream Apply 1 application topically 2 (two) times daily as needed (dry skin). 15 g 0   Cholecalciferol (VITAMIN D) 2000 UNITS CAPS Take 2,000 Units by mouth daily.     diclofenac Sodium (VOLTAREN) 1 % GEL Apply 4 g topically 4 (four) times daily. (Patient taking differently: Apply 4 g topically 4 (four) times daily as needed (pain).) 100 g 0   digoxin (LANOXIN) 0.125 MG tablet TAKE 1 TABLET BY MOUTH EVERY DAY 90 tablet 3   ELIQUIS 5 MG TABS tablet TAKE 1 TABLET BY MOUTH TWICE A DAY 180 tablet 1   enalapril (VASOTEC) 5 MG tablet TAKE 1 TABLET BY MOUTH EVERYDAY AT BEDTIME 90 tablet 3   fluticasone (FLONASE) 50 MCG/ACT nasal spray PLACE 1 SPRAY INTO BOTH NOSTRILS DAILY. (Patient taking differently: Place 1 spray into both nostrils daily as needed for allergies.) 16 mL 2   furosemide (LASIX) 40 MG tablet TAKE 1 TABLET DAILY AS NEEDED FOR EDEMA FOR FLUID OVERLOAD. PLEASE KEEP UPCOMING APPOINTMENT 90 tablet 0   KLOR-CON M20 20 MEQ tablet TAKE 1 TABLET (20 MEQ TOTAL) BY MOUTH DAILY. WHEN TAKING FUROSEMIDE (LASIX). (Patient taking differently: Take 20 mEq  by mouth daily as needed. When taking double  furosemide (Lasix).) 90 tablet 2   metoprolol succinate (TOPROL-XL) 50 MG 24 hr tablet TAKE 1 TABLET BY MOUTH DAILY. TAKE WITH OR IMMEDIATELY FOLLOWING A MEAL. 90 tablet 0   Multiple Vitamin (MULITIVITAMIN WITH MINERALS) TABS Take 1 tablet by mouth daily. Centrum Silver     nystatin cream (MYCOSTATIN) Apply 1 application topically  2 (two) times daily as needed for dry skin (under breasts).     ONETOUCH VERIO test strip USE AS DIRECTED TO CHECK BLOOD SUGARS 1 TIME PER DAY DX: E11.22 50 strip 7   pantoprazole (PROTONIX) 20 MG tablet TAKE 1 TABLET BY MOUTH DAILY AS NEEDED FOR HEARTBURN. 90 tablet 1   Pitavastatin Calcium (LIVALO) 2 MG TABS Take 1 tablet (2 mg total) by mouth daily. 90 tablet 2   VICTOZA 18 MG/3ML SOPN INJECT 1.2 MG UNDER THE SKIN ONCE DAILY 3 mL 3   vitamin C (ASCORBIC ACID) 500 MG tablet Take 500 mg by mouth daily.     No current facility-administered medications for this visit.    Allergies  Allergen Reactions   Codeine Hives and Nausea And Vomiting   Augmentin [Amoxicillin-Pot Clavulanate] Diarrhea   Coreg [Carvedilol]     No energy   Warfarin And Related     Severe rectal bleeding     REVIEW OF SYSTEMS:  *** '[X]'$  denotes positive finding, '[ ]'$  denotes negative finding Cardiac  Comments:  Chest pain or chest pressure:    Shortness of breath upon exertion:    Short of breath when lying flat:    Irregular heart rhythm:        Vascular    Pain in calf, thigh, or hip brought on by ambulation:    Pain in feet at night that wakes you up from your sleep:     Blood clot in your veins:    Leg swelling:         Pulmonary    Oxygen at home:    Productive cough:     Wheezing:         Neurologic    Sudden weakness in arms or legs:     Sudden numbness in arms or legs:     Sudden onset of difficulty speaking or slurred speech:    Temporary loss of vision in one eye:     Problems with dizziness:         Gastrointestinal     Blood in stool:     Vomited blood:         Genitourinary    Burning when urinating:     Blood in urine:        Psychiatric    Major depression:         Hematologic    Bleeding problems:    Problems with blood clotting too easily:        Skin    Rashes or ulcers:        Constitutional    Fever or chills:      PHYSICAL EXAMINATION:  There were no vitals filed for this visit.  General:  WDWN in NAD; vital signs documented above Gait: Not observed HENT: WNL, normocephalic Pulmonary: normal non-labored breathing , without wheezing Cardiac: {Desc; regular/irreg:14544} HR, bruit*** Abdomen: soft, NT, no masses Skin: {With/Without:20273} rashes Vascular Exam/Pulses:  Right Left  Radial {Exam; arterial pulse strength 0-4:30167} {Exam; arterial pulse strength 0-4:30167}  Ulnar {Exam; arterial pulse strength 0-4:30167} {Exam; arterial pulse strength 0-4:30167}  Femoral {Exam; arterial pulse strength 0-4:30167} {Exam; arterial pulse strength 0-4:30167}  Popliteal {Exam; arterial pulse strength 0-4:30167} {Exam; arterial pulse strength 0-4:30167}  DP {Exam; arterial pulse strength 0-4:30167} {Exam; arterial pulse strength 0-4:30167}  PT {Exam; arterial pulse strength 0-4:30167} {Exam; arterial pulse strength 0-4:30167}   Extremities: {With/Without:20273} ischemic changes, {With/Without:20273} Gangrene , {With/Without:20273} cellulitis; {With/Without:20273} open wounds;  Musculoskeletal: no muscle wasting or  atrophy  Neurologic: A&O X 3;  No focal weakness or paresthesias are detected Psychiatric:  The pt has {Desc; normal/abnormal:11317::"Normal"} affect.   Non-Invasive Vascular Imaging:   ***    ASSESSMENT/PLAN: [name] is a 76 y.o. female presenting with ***   ***   Broadus John, MD [facility] [phone]

## 2021-10-25 ENCOUNTER — Encounter: Payer: Medicare Other | Admitting: Vascular Surgery

## 2021-11-02 ENCOUNTER — Other Ambulatory Visit: Payer: Self-pay | Admitting: Internal Medicine

## 2021-11-13 ENCOUNTER — Ambulatory Visit: Payer: Medicare Other | Admitting: Internal Medicine

## 2021-11-20 ENCOUNTER — Encounter: Payer: Self-pay | Admitting: Internal Medicine

## 2021-11-20 ENCOUNTER — Ambulatory Visit (INDEPENDENT_AMBULATORY_CARE_PROVIDER_SITE_OTHER): Payer: Medicare Other | Admitting: Internal Medicine

## 2021-11-20 VITALS — BP 128/70 | HR 99 | Temp 97.5°F | Ht 68.0 in | Wt 205.0 lb

## 2021-11-20 DIAGNOSIS — M545 Low back pain, unspecified: Secondary | ICD-10-CM

## 2021-11-20 DIAGNOSIS — Z794 Long term (current) use of insulin: Secondary | ICD-10-CM

## 2021-11-20 DIAGNOSIS — J438 Other emphysema: Secondary | ICD-10-CM

## 2021-11-20 DIAGNOSIS — Z720 Tobacco use: Secondary | ICD-10-CM

## 2021-11-20 DIAGNOSIS — I130 Hypertensive heart and chronic kidney disease with heart failure and stage 1 through stage 4 chronic kidney disease, or unspecified chronic kidney disease: Secondary | ICD-10-CM

## 2021-11-20 DIAGNOSIS — I480 Paroxysmal atrial fibrillation: Secondary | ICD-10-CM | POA: Diagnosis not present

## 2021-11-20 DIAGNOSIS — G8929 Other chronic pain: Secondary | ICD-10-CM

## 2021-11-20 DIAGNOSIS — I700 Atherosclerosis of aorta: Secondary | ICD-10-CM

## 2021-11-20 DIAGNOSIS — E1122 Type 2 diabetes mellitus with diabetic chronic kidney disease: Secondary | ICD-10-CM | POA: Diagnosis not present

## 2021-11-20 DIAGNOSIS — E6609 Other obesity due to excess calories: Secondary | ICD-10-CM

## 2021-11-20 DIAGNOSIS — Z6831 Body mass index (BMI) 31.0-31.9, adult: Secondary | ICD-10-CM

## 2021-11-20 DIAGNOSIS — N1832 Chronic kidney disease, stage 3b: Secondary | ICD-10-CM

## 2021-11-20 DIAGNOSIS — I5042 Chronic combined systolic (congestive) and diastolic (congestive) heart failure: Secondary | ICD-10-CM | POA: Diagnosis not present

## 2021-11-20 DIAGNOSIS — N2581 Secondary hyperparathyroidism of renal origin: Secondary | ICD-10-CM

## 2021-11-20 MED ORDER — BETAMETHASONE VALERATE 0.1 % EX OINT: 1.0000 | TOPICAL_OINTMENT | Freq: Two times a day (BID) | CUTANEOUS | 0 refills | Status: AC

## 2021-11-20 MED ORDER — TIZANIDINE HCL 4 MG PO TABS: 4.0000 mg | ORAL_TABLET | Freq: Every day | ORAL | 1 refills | Status: DC
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
## 2021-11-20 NOTE — Patient Instructions (Addendum)

## 2021-11-21 LAB — CMP14+EGFR
ALT: 12 IU/L (ref 0–32)
AST: 17 IU/L (ref 0–40)
Albumin/Globulin Ratio: 1.6 (ref 1.2–2.2)
Albumin: 4.5 g/dL (ref 3.7–4.7)
Alkaline Phosphatase: 92 IU/L (ref 44–121)
BUN/Creatinine Ratio: 25 (ref 12–28)
BUN: 39 mg/dL — ABNORMAL HIGH (ref 8–27)
Bilirubin Total: 0.2 mg/dL (ref 0.0–1.2)
CO2: 22 mmol/L (ref 20–29)
Calcium: 9.9 mg/dL (ref 8.7–10.3)
Chloride: 100 mmol/L (ref 96–106)
Creatinine, Ser: 1.55 mg/dL — ABNORMAL HIGH (ref 0.57–1.00)
Globulin, Total: 2.8 g/dL (ref 1.5–4.5)
Glucose: 104 mg/dL — ABNORMAL HIGH (ref 70–99)
Potassium: 4.5 mmol/L (ref 3.5–5.2)
Sodium: 139 mmol/L (ref 134–144)
Total Protein: 7.3 g/dL (ref 6.0–8.5)
eGFR: 34 mL/min/{1.73_m2} — ABNORMAL LOW (ref >59)

## 2021-11-21 LAB — HEMOGLOBIN A1C
Est. average glucose Bld gHb Est-mCnc: 134 mg/dL
Hgb A1c MFr Bld: 6.3 % — ABNORMAL HIGH (ref 4.8–5.6)

## 2021-11-27 ENCOUNTER — Other Ambulatory Visit: Payer: Self-pay | Admitting: Internal Medicine

## 2021-12-01 ENCOUNTER — Other Ambulatory Visit: Payer: Self-pay | Admitting: Cardiovascular Disease

## 2021-12-16 ENCOUNTER — Other Ambulatory Visit: Payer: Self-pay | Admitting: Internal Medicine

## 2021-12-24 ENCOUNTER — Other Ambulatory Visit: Payer: Self-pay | Admitting: Internal Medicine

## 2021-12-24 MED ORDER — TRAMADOL HCL 50 MG PO TABS
50.0000 mg | ORAL_TABLET | Freq: Four times a day (QID) | ORAL | 0 refills | Status: DC | PRN
Start: 2021-12-24 — End: 2022-11-13

## 2022-01-09 ENCOUNTER — Other Ambulatory Visit: Payer: Self-pay | Admitting: Internal Medicine

## 2022-01-14 ENCOUNTER — Other Ambulatory Visit: Payer: Self-pay | Admitting: Internal Medicine

## 2022-01-15 ENCOUNTER — Telehealth: Payer: Self-pay

## 2022-01-15 NOTE — Telephone Encounter (Signed)
Patient called, patient aware. She reports pharmacy keeps sending refill for zanaflex '4mg'$ , she knows to take as needed not every night. She enough for when she needs medication, she has not requested any recent refill for medication.

## 2022-01-23 ENCOUNTER — Ambulatory Visit (INDEPENDENT_AMBULATORY_CARE_PROVIDER_SITE_OTHER): Payer: Medicare Other | Admitting: Cardiovascular Disease

## 2022-01-23 ENCOUNTER — Encounter (HOSPITAL_BASED_OUTPATIENT_CLINIC_OR_DEPARTMENT_OTHER): Payer: Self-pay | Admitting: Cardiovascular Disease

## 2022-01-23 ENCOUNTER — Ambulatory Visit (HOSPITAL_BASED_OUTPATIENT_CLINIC_OR_DEPARTMENT_OTHER): Payer: Medicare Other | Admitting: Cardiovascular Disease

## 2022-01-23 ENCOUNTER — Telehealth: Payer: Self-pay | Admitting: Cardiovascular Disease

## 2022-01-23 VITALS — BP 131/79 | HR 129 | Ht 68.0 in | Wt 200.8 lb

## 2022-01-23 DIAGNOSIS — I700 Atherosclerosis of aorta: Secondary | ICD-10-CM

## 2022-01-23 DIAGNOSIS — I10 Essential (primary) hypertension: Secondary | ICD-10-CM | POA: Diagnosis not present

## 2022-01-23 DIAGNOSIS — I480 Paroxysmal atrial fibrillation: Secondary | ICD-10-CM

## 2022-01-23 DIAGNOSIS — I5042 Chronic combined systolic (congestive) and diastolic (congestive) heart failure: Secondary | ICD-10-CM | POA: Diagnosis not present

## 2022-01-23 MED ORDER — METOPROLOL SUCCINATE ER 50 MG PO TB24: 75.0000 mg | ORAL_TABLET | Freq: Every day | ORAL | 3 refills | Status: DC

## 2022-01-23 NOTE — Telephone Encounter (Signed)
Returned call to patient and scheduled her for phone visit today at 2pm

## 2022-01-23 NOTE — Telephone Encounter (Signed)
RN transferred call from phone operators, patient took a fall off her stairs last night. Patient had to call EMS to get her up. Heart rate was 127 yesterday after the fall and Heart rate in the 130s today. Patient cancelled her appointment today. Patient was offered follow up with APP but states she will only see Dr. [name]. Patient scheduled for 8/30 at 11:40am with Dr. [name].  Will route to Dr. [name] to see if HR control can be discussed before appointment, patient denies chest pain or feeling unwell at this time.

## 2022-01-23 NOTE — Patient Instructions (Signed)
Medication Instructions:  Your physician has recommended you make the following change in your medication:   Increase: Metoprolol Succinate '75mg'$  (1.5 tablets) daily   *If you need a refill on your cardiac medications before your next appointment, please call your pharmacy*  Follow-Up: At [facility], you and your health needs are our priority.  As part of our continuing mission to provide you with exceptional heart care, we have created designated Provider Care Teams.  These Care Teams include your primary Cardiologist (physician) and Advanced Practice Providers (APPs -  Physician Assistants and Nurse Practitioners) who all work together to provide you with the care you need, when you need it.  We recommend signing up for the patient portal called "MyChart".  Sign up information is provided on this After Visit Summary.  MyChart is used to connect with patients for Virtual Visits (Telemedicine).  Patients are able to view lab/test results, encounter notes, upcoming appointments, etc.  Non-urgent messages can be sent to your provider as well.   To learn more about what you can do with MyChart, go to [email or web address].    Your next appointment:   Follow up as scheduled on 8/30 with Dr. [name]   Important Information About Sugar

## 2022-01-23 NOTE — Progress Notes (Signed)
Virtual Visit via Telephone Note   Because of Jenisa Monty Flow's co-morbid illnesses, she is at least at moderate risk for complications without adequate follow up.  This format is felt to be most appropriate for this patient at this time.  The patient did not have access to video technology/had technical difficulties with video requiring transitioning to audio format only (telephone).  All issues noted in this document were discussed and addressed.  No physical exam could be performed with this format.  Please refer to the patient's chart for her consent to telehealth for [facility].   The patient was identified using 2 identifiers.  Date:  01/23/2022   ID:  [name], DOB 1945/11/22, MRN [id number]  Patient Location: Home Provider Location: Office/Clinic  PCP:  [name], MD  Cardiologist:  [name], MD  Electrophysiologist:  None   Evaluation Performed:  Follow-Up Visit  Chief Complaint:  elevated heart rate  History of Present Illness:    [name] is a 76 y.o. female with chronic systolic and diastolic heart failure (LVEF 25 to 30%, nonischemic), PAF, PAD, diabetes, hyperlipidemia, hypertension, and tobacco abuse here for follow up.  She was previously a patient of Dr. Ron Parker and then Dr. Irish Lack.  She was first diagnosed with heart failure 06/2009.  At that time her LVEF was 25%.  This was associated with atrial fibrillation.  She reports having an ablation with Dr. [name] in 1999, though those records are not available at this time.  She has undergone cardioversions for atrial fibrillation with Dr. Ron Parker.  LVEF has ranged from 25 to 35% but has not improved despite beta-blockers and ACE inhibitor.  She had an echo 11/2011 that revealed LVEF 50 to 55%.  However subsequent studies have ranged from 20-35%.  LVEF on Lexiscan Myoview 09/2014 was 41%.  She developed a left bundle branch block pattern in 2014.  Her cardiomyopathy is nonischemic.  She last saw Dr.  Irish Lack 04/2018 and carvedilol was increased due to concern for atrial tachycardia.  She felt fatigued and shaky at the higher dose.  Her ACE inhibitor was reduced 2/2 CKD III.  Prior to that she called the office 12/2017 with volume overload.  She previously discussed ICD with Dr. Irish Lack but was not interested.  We continued this discussion and she continues to want to think about it.  She was considering CRT-D after coronavirus.  Carvedilol was switched to metoprolol due to hypotension and tachycardia.  Her heart rate remained elevated but her energy levels improved.  Digoxin was started due to poor heart rate control and heart failure.  Ms. Messamore had a recent episode of syncope.  She was referred for an ambulatory monitor which revealed atrial fibrillation with PVCs.  She saw Dr. [name] on 4/6 who thought she was actually in atrial tachycardia.  He recommended waiting on ICD to see if her LVEF  Improved and felt she was unlikely to benefit from CRT.  After her last appointment Ms. Crom underwent successful DCCV for atrial tachycardia on 10/15/19.  Since her last appointment she has been struggling with pain in her right hand.  She is working with Dr. Baird Cancer on that.  From a cardiac standpoint she has been doing well.  Her heart rate has been consistently in the 50s-60s since cardioversion.  She no longer feels like she will pass out.  She denies lower extremity edema, orthopnea or PND.  Her weight has been stable. She recently had a lung screening CT  that showed coronary calcification and emphysema.  She is motivated to quit smoking.  She continues to smoke about half pack daily but is trying to cut back.  She isn't interested in any medications.  Lately she has her good days and bad days.  Her breathing is stable and she denies chest pain.  She walks to and from her mailbox and around the house more.  She does this a couple times per day.  She has no edema, orthopnea or PND.  She checks her BP daily  every morning.  Some mornings her BP is as low as 104.    At her last visit Ms. Molock was doing well.  She saw Dr. Gwenlyn Found for PAD on 09/2318.  He recommended continued medical management at this time as her symptoms were not significant and she had no CLI.  She fell on Monday.  She was going up her steps and lost her balance.  She fell onto some flower.  She was unable to get up from the ground and had to call the fire department.  At that time her BP was 696 systolic.  She has struggled with pain and elevated heart rates since that time.  She is unsure why she fell but thinks that she lost her balance.  For the last week her heart rate has been in the 110-120s.  She has no chest pain and her breathing has been stable.  Past Medical History:  Diagnosis Date   Arthritis    "left knee" (03/12/2013)   Atrial fibrillation ([place])    Cardioverted to NSR 08/22/2009   Atrial tachycardia ([place]) 09/30/2019   Cardiomyopathy    CHF (congestive heart failure) (HCC)    Chronic combined systolic and diastolic heart failure ([place]) 04/09/2021   CKD (chronic kidney disease)    Depression    Dizziness    DM2 (diabetes mellitus, type 2) (HCC)    fasting 80-100 (03/12/2013)   Drug therapy 1/11   coumadin   Ejection fraction < 50%    20-25%, echo.06/2009, /  EF 30-35%, diffuse hypokinesis, echo, Oct 16, 2010   GERD (gastroesophageal reflux disease)    Gout    "left knee" (03/12/2013)   History of bronchitis    History of emphysema    HLD (hyperlipidemia)    Hyperkalemia    Hypertension    LFT elevation    06/2009   Mitral regurgitation    mild - echo - 1/11 /  Mild, echo, May15, 2012   Overweight(278.02)    Pneumonia    "@ least twice" (03/12/2013)   Thyroid dysfunction    thyromegally,diffuse,nodule LLL,06/2009   Tobacco abuse    Tricuspid regurgitation    moderate - echo - 1/11   Past Surgical History:  Procedure Laterality Date   BIOPSY  09/29/2020   Procedure: BIOPSY;  Surgeon: Carol [facility], MD;   Location: [facility];  Service: Endoscopy;;   BREAST BIOPSY Right 1980's   BREAST LUMPECTOMY Right 1980's   CARDIOVERSION N/A 10/15/2019   Procedure: CARDIOVERSION;  Surgeon: [name], MD;  Location: [facility];  Service: Cardiovascular;  Laterality: N/A;   CATARACT EXTRACTION W/ INTRAOCULAR LENS  IMPLANT, BILATERAL Bilateral 2010-2012   CHOLECYSTECTOMY  ~ 1978   ESOPHAGOGASTRODUODENOSCOPY (EGD) WITH PROPOFOL N/A 09/29/2020   Procedure: ESOPHAGOGASTRODUODENOSCOPY (EGD) WITH PROPOFOL;  Surgeon: Carol [facility], MD;  Location: [facility];  Service: Endoscopy;  Laterality: N/A;   JOINT REPLACEMENT     TONSILLECTOMY  1960's   TOTAL KNEE ARTHROPLASTY Left 03/12/2013  TOTAL KNEE ARTHROPLASTY Left 03/12/2013   Procedure: TOTAL KNEE ARTHROPLASTY;  Surgeon: Newt Minion, MD;  Location: [facility];  Service: Orthopedics;  Laterality: Left;  Left total knee arthroplasty   TUBAL LIGATION     UPPER ESOPHAGEAL ENDOSCOPIC ULTRASOUND (EUS) N/A 09/29/2020   Procedure: UPPER ESOPHAGEAL ENDOSCOPIC ULTRASOUND (EUS);  Surgeon: Carol [facility], MD;  Location: [facility];  Service: Endoscopy;  Laterality: N/A;   VAGINAL HYSTERECTOMY  1987     Current Meds  Medication Sig   acetaminophen (TYLENOL) 500 MG tablet Take 1,000 mg by mouth 3 (three) times daily as needed for pain.   BD PEN NEEDLE NANO U/F 32G X 4 MM MISC USE AS DIRECTED WITH INSULIN PENS   betamethasone dipropionate 0.05 % cream Apply 1 application topically 2 (two) times daily as needed (dry skin).   betamethasone valerate ointment (VALISONE) 0.1 % Apply 1 Application topically 2 (two) times daily. prn   Cholecalciferol (VITAMIN D) 2000 UNITS CAPS Take 2,000 Units by mouth daily.   diclofenac Sodium (VOLTAREN) 1 % GEL Apply 4 g topically 4 (four) times daily. (Patient taking differently: Apply 4 g topically 4 (four) times daily as needed (pain).)   digoxin (LANOXIN) 0.125 MG tablet TAKE 1 TABLET BY MOUTH EVERY DAY   ELIQUIS 5 MG TABS tablet  TAKE 1 TABLET BY MOUTH TWICE A DAY   enalapril (VASOTEC) 5 MG tablet TAKE 1 TABLET BY MOUTH EVERYDAY AT BEDTIME   furosemide (LASIX) 40 MG tablet TAKE 1 TABLET DAILY AS NEEDED FOR EDEMA FOR FLUID OVERLOAD. PLEASE KEEP UPCOMING APPOINTMENT   KLOR-CON M20 20 MEQ tablet TAKE 1 TABLET (20 MEQ TOTAL) BY MOUTH DAILY. WHEN TAKING FUROSEMIDE (LASIX). (Patient taking differently: Take 20 mEq by mouth daily as needed. When taking double  furosemide (Lasix).)   metoprolol succinate (TOPROL-XL) 50 MG 24 hr tablet Take 1.5 tablets (75 mg total) by mouth daily. Take with or immediately following a meal.   Multiple Vitamin (MULITIVITAMIN WITH MINERALS) TABS Take 1 tablet by mouth daily. Centrum Silver   nystatin cream (MYCOSTATIN) Apply 1 application topically 2 (two) times daily as needed for dry skin (under breasts).   ONETOUCH VERIO test strip USE AS DIRECTED TO CHECK BLOOD SUGARS 1 TIME PER DAY DX: E11.22   pantoprazole (PROTONIX) 20 MG tablet TAKE 1 TABLET BY MOUTH DAILY AS NEEDED FOR HEARTBURN.   Pitavastatin Calcium (LIVALO) 2 MG TABS Take 1 tablet (2 mg total) by mouth daily.   tiZANidine (ZANAFLEX) 4 MG tablet TAKE 1 TABLET (4 MG TOTAL) BY MOUTH DAILY AS NEEDED FOR BACK PAIN   VICTOZA 18 MG/3ML SOPN INJECT 1.2 MG UNDER THE SKIN ONCE DAILY   vitamin C (ASCORBIC ACID) 500 MG tablet Take 500 mg by mouth daily.   [DISCONTINUED] metoprolol succinate (TOPROL-XL) 50 MG 24 hr tablet TAKE 1 TABLET BY MOUTH EVERY DAY WITH OR IMMEDIATELY FOLLOWING A MEAL     Allergies:   Codeine, Augmentin [amoxicillin-pot clavulanate], Coreg [carvedilol], and Warfarin and related   Social History   Tobacco Use   Smoking status: Every Day    Packs/day: 0.50    Years: 40.00    Total pack years: 20.00    Types: Cigarettes   Smokeless tobacco: Never   Tobacco comments:    smoked 1.5ppd x 9, 1ppdx40, cut back to 1/2ppd past 5 years  Vaping Use   Vaping Use: Never used  Substance Use Topics   Alcohol use: No   Drug use:  No  Family Hx: The patient's family history includes Colon cancer in an other family member; Heart attack in her son; Heart disease in an other family member; Hypertension in her sister and son; Prostate cancer in her father; Stroke in her maternal grandfather, mother, and paternal grandmother.  ROS:   Please see the history of present illness.     All other systems reviewed and are negative.   Prior CV studies:   The following studies were reviewed today:  Echo 07/28/15: Study Conclusions   - Left ventricle: The cavity size was normal. There was moderate   concentric hypertrophy. Systolic function was severely reduced.   The estimated ejection fraction was in the range of 25% to 30%.   Diffuse hypokinesis. Features are consistent with a pseudonormal   left ventricular filling pattern, with concomitant abnormal   relaxation and increased filling pressure (grade 2 diastolic   dysfunction). Doppler parameters are consistent with high   ventricular filling pressure. - Aortic valve: Transvalvular velocity was within the normal range.   There was no stenosis. There was no regurgitation. - Mitral valve: Calcified annulus. Mildly thickened leaflets .   There was trivial regurgitation. - Left atrium: The atrium was mildly dilated. - Right ventricle: The cavity size was normal. Wall thickness was   normal. Systolic function was normal. - Atrial septum: A patent foramen ovale cannot be excluded. - Tricuspid valve: There was mild regurgitation. Peak RV-RA   gradient (S): 30 mm Hg. - Inferior vena cava: The vessel was normal in size. The   respirophasic diameter changes were in the normal range (= 50%),   consistent with normal central venous pressure.  Lexiscan Myoview 09/08/14: LVEF 41%.  No ischemia.  Echo 03/07/20:  1. Since the prior study on 02/23/2019 LVEF has increased from 30-35% to  40-45%.   2. There is a false tending in the left ventricular apex. Left  ventricular  ejection fraction, by estimation, is 40 to 45%. The left  ventricle has mildly decreased function. The left ventricle demonstrates  global hypokinesis. Left ventricular diastolic  parameters are consistent with Grade II diastolic dysfunction  (pseudonormalization). Elevated left atrial pressure. The average left  ventricular global longitudinal strain is -13.7 %. The global longitudinal  strain is abnormal.   3. Right ventricular systolic function is normal. The right ventricular  size is normal.   4. Left atrial size was moderately dilated.   5. The mitral valve is normal in structure. Mild mitral valve  regurgitation. No evidence of mitral stenosis. Severe mitral annular  calcification.   6. The aortic valve is normal in structure. There is mild calcification  of the aortic valve. There is mild thickening of the aortic valve. Aortic  valve regurgitation is not visualized. Mild to moderate aortic valve  sclerosis/calcification is present,  without any evidence of aortic stenosis.   7. The inferior vena cava is normal in size with greater than 50%  respiratory variability, suggesting right atrial pressure of 3 mmHg.   Labs/Other Tests and Data Reviewed:    EKG:  An ECG dated 04/22/19 was personally reviewed today and demonstrated:  Ectopic atrial rhythm.  Rate 95 bpm.  PVCs.  IVCD.  QRS 130 ms.   Recent Labs: 03/14/2021: Hemoglobin 12.9; Platelets 227 11/20/2021: ALT 12; BUN 39; Creatinine, Ser 1.55; Potassium 4.5; Sodium 139   Recent Lipid Panel Lab Results  Component Value Date/Time   CHOL 131 03/14/2021 03:43 PM   TRIG 140 03/14/2021 03:43 PM   HDL 36 (  L) 03/14/2021 03:43 PM   CHOLHDL 3.6 03/14/2021 03:43 PM   CHOLHDL 4.8 06/27/2009 03:28 AM   LDLCALC 70 03/14/2021 03:43 PM    Wt Readings from Last 3 Encounters:  01/23/22 200 lb 12.8 oz (91.1 kg)  11/20/21 205 lb (93 kg)  09/25/21 207 lb (93.9 kg)     Objective:    BP 131/79   Pulse (!) 129   Ht '5\' 8"'$  (1.727 m)    Wt 200 lb 12.8 oz (91.1 kg)   BMI 30.53 kg/m  GENERAL: Sounds well.  No acute distress. RESP: Respirations unlabored NEURO:  Speech fluent.  Cranial nerves grossly intact.  Moves all 4 extremities freely PSYCH:  Cognitively intact, oriented to person place and time    ASSESSMENT & PLAN:    Paroxysmal atrial fibrillation [facility]) She has a history of multiple cardioversions and atrial fibrillation ablation.  She has been tachycardic recently.  Unclear whether this is atrial tachycardia or A-fib as we are unable to get an EKG since she is at home.  We will assess next week when she comes for follow-up.  For now, increase metoprolol to 75 mg daily.  Continue digoxin and Eliquis.  Essential hypertension BP well-controlled on enalapril and metoprolol.  Increasing metoprolol as above.  The patient does not have symptoms concerning for COVID-19 infection (fever, chills, cough, or new shortness of breath).   COVID-19 Education: The signs and symptoms of COVID-19 were discussed with the patient and how to seek care for testing (follow up with PCP or arrange E-visit).  The importance of social distancing was discussed today.  Time:   Today, I have spent 22 minutes with the patient with telehealth technology discussing the above problems.     Medication Adjustments/Labs and Tests Ordered: Current medicines are reviewed at length with the patient today.  Concerns regarding medicines are outlined above.   Tests Ordered: No orders of the defined types were placed in this encounter.    Medication Changes: Meds ordered this encounter  Medications   metoprolol succinate (TOPROL-XL) 50 MG 24 hr tablet    Sig: Take 1.5 tablets (75 mg total) by mouth daily. Take with or immediately following a meal.    Dispense:  120 tablet    Refill:  3     Disposition:  Follow up in 6 month(s)  Signed, [name], MD  01/23/2022 2:11 PM    [name]

## 2022-01-23 NOTE — Assessment & Plan Note (Signed)
BP well-controlled on enalapril and metoprolol.  Increasing metoprolol as above.

## 2022-01-23 NOTE — Telephone Encounter (Signed)
STAT if HR is under 50 or over 120 (normal HR is 60-100 beats per minute)  What is your heart rate? 133 today   Do you have a log of your heart rate readings (document readings)? 127 yesterday   Do you have any other symptoms? no  Patient says she fell last night on the steps and cannot hardly walk so she had to cancel her appointment today. Patient refused to reschedule with an APP. She says the fire dept had to come and help her get up.

## 2022-01-23 NOTE — Assessment & Plan Note (Signed)
She has a history of multiple cardioversions and atrial fibrillation ablation.  She has been tachycardic recently.  Unclear whether this is atrial tachycardia or A-fib as we are unable to get an EKG since she is at home.  We will assess next week when she comes for follow-up.  For now, increase metoprolol to 75 mg daily.  Continue digoxin and Eliquis.

## 2022-01-27 ENCOUNTER — Other Ambulatory Visit: Payer: Self-pay | Admitting: Internal Medicine

## 2022-01-30 ENCOUNTER — Encounter (HOSPITAL_BASED_OUTPATIENT_CLINIC_OR_DEPARTMENT_OTHER): Payer: Self-pay | Admitting: Cardiovascular Disease

## 2022-01-30 ENCOUNTER — Telehealth: Payer: Self-pay | Admitting: Cardiovascular Disease

## 2022-01-30 ENCOUNTER — Ambulatory Visit (HOSPITAL_BASED_OUTPATIENT_CLINIC_OR_DEPARTMENT_OTHER): Payer: Medicare Other | Admitting: Cardiovascular Disease

## 2022-01-30 VITALS — BP 115/71 | HR 129 | Ht 68.0 in | Wt 204.0 lb

## 2022-01-30 DIAGNOSIS — I10 Essential (primary) hypertension: Secondary | ICD-10-CM | POA: Diagnosis not present

## 2022-01-30 DIAGNOSIS — M25579 Pain in unspecified ankle and joints of unspecified foot: Secondary | ICD-10-CM

## 2022-01-30 DIAGNOSIS — Z01812 Encounter for preprocedural laboratory examination: Secondary | ICD-10-CM

## 2022-01-30 DIAGNOSIS — I480 Paroxysmal atrial fibrillation: Secondary | ICD-10-CM | POA: Diagnosis not present

## 2022-01-30 DIAGNOSIS — M25473 Effusion, unspecified ankle: Secondary | ICD-10-CM

## 2022-01-30 NOTE — Progress Notes (Deleted)
Cardiology Offic Note   Date:  01/30/2022   ID:  [name], DOB 1946/05/14, MRN [id number]  PCP:  [name], MD  Cardiologist:  [name], MD  Electrophysiologist:  None   Evaluation Performed:  Follow-Up Visit  Chief Complaint:  elevated heart rate  History of Present Illness:    [name] is a 76 y.o. female with chronic systolic and diastolic heart failure (LVEF 25 to 30%, nonischemic), PAF, PAD, diabetes, hyperlipidemia, hypertension, and tobacco abuse here for follow up.  She was previously a patient of Dr. Ron Parker and then Dr. Irish Lack.  She was first diagnosed with heart failure 06/2009.  At that time her LVEF was 25%.  This was associated with atrial fibrillation.  She reports having an ablation with Dr. [name] in 1999, though those records are not available at this time.  She has undergone cardioversions for atrial fibrillation with Dr. Ron Parker.  LVEF has ranged from 25 to 35% but has not improved despite beta-blockers and ACE inhibitor.  She had an echo 11/2011 that revealed LVEF 50 to 55%.  However subsequent studies have ranged from 20-35%.  LVEF on Lexiscan Myoview 09/2014 was 41%.  She developed a left bundle branch block pattern in 2014.  Her cardiomyopathy is nonischemic.  She last saw Dr. Irish Lack 04/2018 and carvedilol was increased due to concern for atrial tachycardia.  She felt fatigued and shaky at the higher dose.  Her ACE inhibitor was reduced 2/2 CKD III.  Prior to that she called the office 12/2017 with volume overload.  She previously discussed ICD with Dr. Irish Lack but was not interested.  We continued this discussion and she continues to want to think about it.  She was considering CRT-D after coronavirus.  Carvedilol was switched to metoprolol due to hypotension and tachycardia.  Her heart rate remained elevated but her energy levels improved.  Digoxin was started due to poor heart rate control and heart failure.  [name] had an episode of syncope.   She was referred for an ambulatory monitor which revealed atrial fibrillation with PVCs.  She saw Dr. [name] on 4/6 who thought she was actually in atrial tachycardia.  He recommended waiting on ICD to see if her LVEF  Improved and felt she was unlikely to benefit from CRT.  [name] underwent successful DCCV for atrial tachycardia on 10/15/19.  She had a lung screening CT that showed coronary calcification and emphysema.    She saw Dr. Gwenlyn Found for PAD on 09/2318.  He recommended continued medical management at this time as her symptoms were not significant and she had no CLI.  Two weeks ago she fell.   She was going up her steps and lost her balance.  She fell onto some flower.  She was unable to get up from the ground and had to call the fire department.  At that time her BP was 034 systolic.  She has struggled with pain and elevated heart rates since that time.  She had a virtual visit last week and her heart rate had been in the 110s to 120s.  Metoprolol was increased and she presents today for follow-up.  Past Medical History:  Diagnosis Date   Arthritis    "left knee" (03/12/2013)   Atrial fibrillation ([place])    Cardioverted to NSR 08/22/2009   Atrial tachycardia ([place]) 09/30/2019   Cardiomyopathy    CHF (congestive heart failure) (HCC)    Chronic combined systolic and diastolic heart failure ([place]) 04/09/2021  CKD (chronic kidney disease)    Depression    Dizziness    DM2 (diabetes mellitus, type 2) (HCC)    fasting 80-100 (03/12/2013)   Drug therapy 1/11   coumadin   Ejection fraction < 50%    20-25%, echo.06/2009, /  EF 30-35%, diffuse hypokinesis, echo, Oct 16, 2010   GERD (gastroesophageal reflux disease)    Gout    "left knee" (03/12/2013)   History of bronchitis    History of emphysema    HLD (hyperlipidemia)    Hyperkalemia    Hypertension    LFT elevation    06/2009   Mitral regurgitation    mild - echo - 1/11 /  Mild, echo, May15, 2012   Overweight(278.02)    Pneumonia     "@ least twice" (03/12/2013)   Thyroid dysfunction    thyromegally,diffuse,nodule LLL,06/2009   Tobacco abuse    Tricuspid regurgitation    moderate - echo - 1/11   Past Surgical History:  Procedure Laterality Date   BIOPSY  09/29/2020   Procedure: BIOPSY;  Surgeon: Carol Ada, MD;  Location: [facility];  Service: Endoscopy;;   BREAST BIOPSY Right 1980's   BREAST LUMPECTOMY Right 1980's   CARDIOVERSION N/A 10/15/2019   Procedure: CARDIOVERSION;  Surgeon: [name], MD;  Location: [facility];  Service: Cardiovascular;  Laterality: N/A;   CATARACT EXTRACTION W/ INTRAOCULAR LENS  IMPLANT, BILATERAL Bilateral 2010-2012   CHOLECYSTECTOMY  ~ 1978   ESOPHAGOGASTRODUODENOSCOPY (EGD) WITH PROPOFOL N/A 09/29/2020   Procedure: ESOPHAGOGASTRODUODENOSCOPY (EGD) WITH PROPOFOL;  Surgeon: Carol Ada, MD;  Location: [facility];  Service: Endoscopy;  Laterality: N/A;   JOINT REPLACEMENT     TONSILLECTOMY  1960's   TOTAL KNEE ARTHROPLASTY Left 03/12/2013   TOTAL KNEE ARTHROPLASTY Left 03/12/2013   Procedure: TOTAL KNEE ARTHROPLASTY;  Surgeon: Newt Minion, MD;  Location: [facility];  Service: Orthopedics;  Laterality: Left;  Left total knee arthroplasty   TUBAL LIGATION     UPPER ESOPHAGEAL ENDOSCOPIC ULTRASOUND (EUS) N/A 09/29/2020   Procedure: UPPER ESOPHAGEAL ENDOSCOPIC ULTRASOUND (EUS);  Surgeon: Carol Ada, MD;  Location: [facility];  Service: Endoscopy;  Laterality: N/A;   VAGINAL HYSTERECTOMY  1987     No outpatient medications have been marked as taking for the 01/30/22 encounter (Appointment) with [name], MD.     Allergies:   Codeine, Augmentin [amoxicillin-pot clavulanate], Coreg [carvedilol], and Warfarin and related   Social History   Tobacco Use   Smoking status: Every Day    Packs/day: 0.50    Years: 40.00    Total pack years: 20.00    Types: Cigarettes   Smokeless tobacco: Never   Tobacco comments:    smoked 1.5ppd x 9, 1ppdx40, cut back to 1/2ppd  past 5 years  Vaping Use   Vaping Use: Never used  Substance Use Topics   Alcohol use: No   Drug use: No     Family Hx: The patient's family history includes Colon cancer in an other family member; Heart attack in her son; Heart disease in an other family member; Hypertension in her sister and son; Prostate cancer in her father; Stroke in her maternal grandfather, mother, and paternal grandmother.  ROS:   Please see the history of present illness.     All other systems reviewed and are negative.   Prior CV studies:   The following studies were reviewed today:  Echo 07/28/15: Study Conclusions   - Left ventricle: The cavity size was normal. There was moderate  concentric hypertrophy. Systolic function was severely reduced.   The estimated ejection fraction was in the range of 25% to 30%.   Diffuse hypokinesis. Features are consistent with a pseudonormal   left ventricular filling pattern, with concomitant abnormal   relaxation and increased filling pressure (grade 2 diastolic   dysfunction). Doppler parameters are consistent with high   ventricular filling pressure. - Aortic valve: Transvalvular velocity was within the normal range.   There was no stenosis. There was no regurgitation. - Mitral valve: Calcified annulus. Mildly thickened leaflets .   There was trivial regurgitation. - Left atrium: The atrium was mildly dilated. - Right ventricle: The cavity size was normal. Wall thickness was   normal. Systolic function was normal. - Atrial septum: A patent foramen ovale cannot be excluded. - Tricuspid valve: There was mild regurgitation. Peak RV-RA   gradient (S): 30 mm Hg. - Inferior vena cava: The vessel was normal in size. The   respirophasic diameter changes were in the normal range (= 50%),   consistent with normal central venous pressure.  Lexiscan Myoview 09/08/14: LVEF 41%.  No ischemia.  Echo 03/07/20:  1. Since the prior study on 02/23/2019 LVEF has increased from  30-35% to  40-45%.   2. There is a false tending in the left ventricular apex. Left  ventricular ejection fraction, by estimation, is 40 to 45%. The left  ventricle has mildly decreased function. The left ventricle demonstrates  global hypokinesis. Left ventricular diastolic  parameters are consistent with Grade II diastolic dysfunction  (pseudonormalization). Elevated left atrial pressure. The average left  ventricular global longitudinal strain is -13.7 %. The global longitudinal  strain is abnormal.   3. Right ventricular systolic function is normal. The right ventricular  size is normal.   4. Left atrial size was moderately dilated.   5. The mitral valve is normal in structure. Mild mitral valve  regurgitation. No evidence of mitral stenosis. Severe mitral annular  calcification.   6. The aortic valve is normal in structure. There is mild calcification  of the aortic valve. There is mild thickening of the aortic valve. Aortic  valve regurgitation is not visualized. Mild to moderate aortic valve  sclerosis/calcification is present,  without any evidence of aortic stenosis.   7. The inferior vena cava is normal in size with greater than 50%  respiratory variability, suggesting right atrial pressure of 3 mmHg.   Labs/Other Tests and Data Reviewed:    EKG:  An ECG dated 04/22/19 was personally reviewed today and demonstrated:  Ectopic atrial rhythm.  Rate 95 bpm.  PVCs.  IVCD.  QRS 130 ms.   Recent Labs: 03/14/2021: Hemoglobin 12.9; Platelets 227 11/20/2021: ALT 12; BUN 39; Creatinine, Ser 1.55; Potassium 4.5; Sodium 139   Recent Lipid Panel Lab Results  Component Value Date/Time   CHOL 131 03/14/2021 03:43 PM   TRIG 140 03/14/2021 03:43 PM   HDL 36 (L) 03/14/2021 03:43 PM   CHOLHDL 3.6 03/14/2021 03:43 PM   CHOLHDL 4.8 06/27/2009 03:28 AM   LDLCALC 70 03/14/2021 03:43 PM    Wt Readings from Last 3 Encounters:  01/23/22 200 lb 12.8 oz (91.1 kg)  11/20/21 205 lb (93 kg)   09/25/21 207 lb (93.9 kg)     Objective:    There were no vitals taken for this visit. GENERAL: Sounds well.  No acute distress. RESP: Respirations unlabored NEURO:  Speech fluent.  Cranial nerves grossly intact.  Moves all 4 extremities freely PSYCH:  Cognitively intact, oriented  to person place and time    ASSESSMENT & PLAN:    No problem-specific Assessment & Plan notes found for this encounter.    Medication Adjustments/Labs and Tests Ordered: Current medicines are reviewed at length with the patient today.  Concerns regarding medicines are outlined above.   Tests Ordered: No orders of the defined types were placed in this encounter.    Medication Changes: No orders of the defined types were placed in this encounter.    Disposition:  Follow up in 6 month(s)  Signed, [name], MD  01/30/2022 5:48 AM    [place]

## 2022-01-30 NOTE — Telephone Encounter (Signed)
Pt is needing to reschedule her procedure due to her sister being able to come the week after to help her to and from appt and also to help take care of her after. Requesting call back.

## 2022-01-30 NOTE — Telephone Encounter (Signed)
Patient has been rescheduled to 9/13 arrive at 9:00 am with Dr Harl Bowie Patient aware that she will need labs redone prior, she will plan on going Thursday or Friday next week

## 2022-01-30 NOTE — Patient Instructions (Signed)
Medication Instructions:  HOLD ENALAPRIL UNTIL AFTER YOUR CARDIOVERSION   INCREASE YOUR METOPROLOL TO 100 MG DAILY UNTIL AFTER YOUR CARDIOVERSION   Labwork: BMET/CBC/URIC ACID TODAY   Testing/Procedures: CARDIOVERSION NEXT WEEK   Follow-Up: 02/26/2022 2:20 PM WITH DR [place]   You have been referred to DR CAMNITZ   Any Other Special Instructions Will Be Listed Below (If Applicable).  You are scheduled for a Cardioversion on 02/06/2022 with Dr. Debara Pickett.  Please arrive at the [facility] (Main Entrance A) at [facility]: [address] [place], [place] [address] at 9:00 am. (1 hour prior to procedure unless lab work is needed; if lab work is needed arrive 1.5 hours ahead)  DIET: Nothing to eat or drink after midnight except a sip of water with medications (see medication instructions below)  FYI: For your safety, and to allow [place] to monitor your vital signs accurately during the surgery/procedure we request that   if you have artificial nails, gel coating, SNS etc. Please have those removed prior to your surgery/procedure. Not having the nail coverings /polish removed may result in cancellation or delay of your surgery/procedure.  Medication Instructions: Hold FUROSEMIDE   Continue your anticoagulant: ELIQUIS  You will need to continue your anticoagulant after your procedure until you are told by your  Provider that it is safe to stop  Labs: TODAY   You must have a responsible person to drive you home and stay in the waiting area during your procedure. Failure to do so could result in cancellation.  Bring your insurance cards.  *Special Note: Every effort is made to have your procedure done on time. Occasionally there are emergencies that occur at the hospital that may cause delays. Please be patient if a delay does occur.

## 2022-01-30 NOTE — Addendum Note (Signed)
Addended by: Alvina Filbert B on: 01/30/2022 04:51 PM   Modules accepted: Orders

## 2022-01-30 NOTE — Progress Notes (Signed)
Cardiology Office Note   Date:  01/30/2022   ID:  [name], [name] [birth year], MRN [id number]  PCP:  [name], MD  Cardiologist:  [name], MD  Electrophysiologist:  None   Evaluation Performed:  Follow-Up Visit  Chief Complaint:  elevated heart rate  History of Present Illness:    [name] is a 76 y.o. female with chronic systolic and diastolic heart failure (LVEF 25 to 30%, nonischemic), PAF, PAD, diabetes, hyperlipidemia, hypertension, and tobacco abuse here for follow up.  She was previously a patient of Dr. Ron Parker and then Dr. Irish Lack.  She was first diagnosed with heart failure 06/2009.  At that time her LVEF was 25%.  This was associated with atrial fibrillation.  She reports having an ablation with Dr. [name] in 1999, though those records are not available at this time.  She has undergone cardioversions for atrial fibrillation with Dr. Ron Parker.  LVEF has ranged from 25 to 35% but has not improved despite beta-blockers and ACE inhibitor.  She had an echo 11/2011 that revealed LVEF 50 to 55%.  However subsequent studies have ranged from 20-35%.  LVEF on Lexiscan Myoview 09/2014 was 41%.  She developed a left bundle branch block pattern in 2014.  Her cardiomyopathy is nonischemic.  She last saw Dr. Irish Lack 04/2018 and carvedilol was increased due to concern for atrial tachycardia.  She felt fatigued and shaky at the higher dose.  Her ACE inhibitor was reduced 2/2 CKD III.  Prior to that she called the office 12/2017 with volume overload.  She previously discussed ICD with Dr. Irish Lack but was not interested.  We continued this discussion and she continues to want to think about it.  She was considering CRT-D after coronavirus.  Carvedilol was switched to metoprolol due to hypotension and tachycardia.  Her heart rate remained elevated but her energy levels improved.  Digoxin was started due to poor heart rate control and heart failure.  [name] had an episode of syncope.   She was referred for an ambulatory monitor which revealed atrial fibrillation with PVCs.  She saw Dr. [name] on 4/6 who thought she was actually in atrial tachycardia.  He recommended waiting on ICD to see if her LVEF  Improved and felt she was unlikely to benefit from CRT.  [name] underwent successful DCCV for atrial tachycardia on 10/15/19.  She had a lung screening CT that showed coronary calcification and emphysema.    She saw Dr. Gwenlyn Found for PAD on 09/2318.  He recommended continued medical management at this time as her symptoms were not significant and she had no CLI.  Two weeks ago she fell.   She was going up her steps and lost her balance.  She fell onto some flower.  She was unable to get up from the ground and had to call the fire department.  At that time her BP was 962 systolic.  She has struggled with pain and elevated heart rates since that time.  She had a virtual visit last week and her heart rate had been in the 110s to 120s.  Metoprolol was increased and she presents today for follow-up.  Today, she appears well. She endorses her heart rate has been better lately, and she saw a rate in the 40s last week. Of note, she has some bilateral ankle edema that is noticeably worse on her right ankle. She reports this edema has been present for about three weeks now. She denies any chest pain or  shortness of breath. No lightheadedness, headaches, syncope, orthopnea, or PND.   Past Medical History:  Diagnosis Date   Arthritis    "left knee" (03/12/2013)   Atrial fibrillation ([place])    Cardioverted to NSR 08/22/2009   Atrial tachycardia ([place]) 09/30/2019   Cardiomyopathy    CHF (congestive heart failure) ([facility])    Chronic combined systolic and diastolic heart failure ([place]) 04/09/2021   CKD (chronic kidney disease)    Depression    Dizziness    DM2 (diabetes mellitus, type 2) ([facility])    fasting 80-100 (03/12/2013)   Drug therapy 1/11   coumadin   Ejection fraction < 50%    20-25%,  echo.06/2009, /  EF 30-35%, diffuse hypokinesis, echo, Oct 16, 2010   GERD (gastroesophageal reflux disease)    Gout    "left knee" (03/12/2013)   History of bronchitis    History of emphysema    HLD (hyperlipidemia)    Hyperkalemia    Hypertension    LFT elevation    06/2009   Mitral regurgitation    mild - echo - 1/11 /  Mild, echo, May15, 2012   Overweight(278.02)    Pneumonia    "@ least twice" (03/12/2013)   Thyroid dysfunction    thyromegally,diffuse,nodule LLL,06/2009   Tobacco abuse    Tricuspid regurgitation    moderate - echo - 1/11   Past Surgical History:  Procedure Laterality Date   BIOPSY  09/29/2020   Procedure: BIOPSY;  Surgeon: Carol Ada, MD;  Location: [facility];  Service: Endoscopy;;   BREAST BIOPSY Right 1980's   BREAST LUMPECTOMY Right 1980's   CARDIOVERSION N/A 10/15/2019   Procedure: CARDIOVERSION;  Surgeon: [name], MD;  Location: [facility];  Service: Cardiovascular;  Laterality: N/A;   CATARACT EXTRACTION W/ INTRAOCULAR LENS  IMPLANT, BILATERAL Bilateral 2010-2012   CHOLECYSTECTOMY  ~ 1978   ESOPHAGOGASTRODUODENOSCOPY (EGD) WITH PROPOFOL N/A 09/29/2020   Procedure: ESOPHAGOGASTRODUODENOSCOPY (EGD) WITH PROPOFOL;  Surgeon: Carol Ada, MD;  Location: [facility];  Service: Endoscopy;  Laterality: N/A;   JOINT REPLACEMENT     TONSILLECTOMY  1960's   TOTAL KNEE ARTHROPLASTY Left 03/12/2013   TOTAL KNEE ARTHROPLASTY Left 03/12/2013   Procedure: TOTAL KNEE ARTHROPLASTY;  Surgeon: Newt Minion, MD;  Location: [facility];  Service: Orthopedics;  Laterality: Left;  Left total knee arthroplasty   TUBAL LIGATION     UPPER ESOPHAGEAL ENDOSCOPIC ULTRASOUND (EUS) N/A 09/29/2020   Procedure: UPPER ESOPHAGEAL ENDOSCOPIC ULTRASOUND (EUS);  Surgeon: Carol Ada, MD;  Location: [facility];  Service: Endoscopy;  Laterality: N/A;   VAGINAL HYSTERECTOMY  1987     Current Meds  Medication Sig   acetaminophen (TYLENOL) 500 MG tablet Take 1,000 mg by  mouth 3 (three) times daily as needed for pain.   BD PEN NEEDLE NANO U/F 32G X 4 MM MISC USE AS DIRECTED WITH INSULIN PENS   betamethasone dipropionate 0.05 % cream Apply 1 application topically 2 (two) times daily as needed (dry skin).   betamethasone valerate ointment (VALISONE) 0.1 % Apply 1 Application topically 2 (two) times daily. prn   Cholecalciferol (VITAMIN D) 2000 UNITS CAPS Take 2,000 Units by mouth daily.   diclofenac Sodium (VOLTAREN) 1 % GEL Apply 4 g topically 4 (four) times daily. (Patient taking differently: Apply 4 g topically 4 (four) times daily as needed (pain).)   digoxin (LANOXIN) 0.125 MG tablet TAKE 1 TABLET BY MOUTH EVERY DAY   ELIQUIS 5 MG TABS tablet TAKE 1 TABLET BY MOUTH TWICE A  DAY   enalapril (VASOTEC) 5 MG tablet TAKE 1 TABLET BY MOUTH EVERYDAY AT BEDTIME   fluticasone (FLONASE) 50 MCG/ACT nasal spray PLACE 1 SPRAY INTO BOTH NOSTRILS DAILY. (Patient taking differently: Place 1 spray into both nostrils daily as needed for allergies.)   furosemide (LASIX) 40 MG tablet TAKE 1 TABLET DAILY AS NEEDED FOR EDEMA FOR FLUID OVERLOAD. PLEASE KEEP UPCOMING APPOINTMENT   KLOR-CON M20 20 MEQ tablet TAKE 1 TABLET (20 MEQ TOTAL) BY MOUTH DAILY. WHEN TAKING FUROSEMIDE (LASIX). (Patient taking differently: Take 20 mEq by mouth daily as needed. When taking double  furosemide (Lasix).)   LIVALO 2 MG TABS TAKE 1 TABLET BY MOUTH EVERY DAY   metoprolol succinate (TOPROL-XL) 50 MG 24 hr tablet Take 1.5 tablets (75 mg total) by mouth daily. Take with or immediately following a meal.   Multiple Vitamin (MULITIVITAMIN WITH MINERALS) TABS Take 1 tablet by mouth daily. Centrum Silver   nystatin cream (MYCOSTATIN) Apply 1 application topically 2 (two) times daily as needed for dry skin (under breasts).   ONETOUCH VERIO test strip USE AS DIRECTED TO CHECK BLOOD SUGARS 1 TIME PER DAY DX: E11.22   pantoprazole (PROTONIX) 20 MG tablet TAKE 1 TABLET BY MOUTH DAILY AS NEEDED FOR HEARTBURN.    tiZANidine (ZANAFLEX) 4 MG tablet TAKE 1 TABLET (4 MG TOTAL) BY MOUTH DAILY AS NEEDED FOR BACK PAIN   traMADol (ULTRAM) 50 MG tablet Take 1 tablet (50 mg total) by mouth every 6 (six) hours as needed.   VICTOZA 18 MG/3ML SOPN INJECT 1.2 MG UNDER THE SKIN ONCE DAILY   vitamin C (ASCORBIC ACID) 500 MG tablet Take 500 mg by mouth daily.     Allergies:   Codeine, Augmentin [amoxicillin-pot clavulanate], Coreg [carvedilol], and Warfarin and related   Social History   Tobacco Use   Smoking status: Every Day    Packs/day: 0.50    Years: 40.00    Total pack years: 20.00    Types: Cigarettes   Smokeless tobacco: Never   Tobacco comments:    smoked 1.5ppd x 9, 1ppdx40, cut back to 1/2ppd past 5 years  Vaping Use   Vaping Use: Never used  Substance Use Topics   Alcohol use: No   Drug use: No     Family Hx: The patient's family history includes Colon cancer in an other family member; Heart attack in her son; Heart disease in an other family member; Hypertension in her sister and son; Prostate cancer in her father; Stroke in her maternal grandfather, mother, and paternal grandmother.  ROS:   Please see the history of present illness.    (+) Bilateral ankle edema  All other systems reviewed and are negative.   Prior CV studies:    The following studies were reviewed today:  Bilateral LE Arterial Doppler 09/10/2021: Summary:  Right: Atherosclerosis and medial calcifications throughout.  75-99% stenosis in the mid CFA.  Two vessel run-off via ATA and peroneal artery.   Left: Atherosclerosis and medial calcifications throughout.  30-49% stenosis in the proximal SFA distal to ostium.  One vessel run-off via ATA with elevated velocities in the proximal  segment suggesting 50-74% stenosis, based on velocity ratio of 3.4.    Echo 03/07/2020: 1. Since the prior study on 02/23/2019 LVEF has increased from 30-35% to  40-45%.   2. There is a false tending in the left ventricular apex. Left   ventricular ejection fraction, by estimation, is 40 to 45%. The left  ventricle has mildly decreased  function. The left ventricle demonstrates  global hypokinesis. Left ventricular diastolic  parameters are consistent with Grade II diastolic dysfunction  (pseudonormalization). Elevated left atrial pressure. The average left  ventricular global longitudinal strain is -13.7 %. The global longitudinal  strain is abnormal.   3. Right ventricular systolic function is normal. The right ventricular  size is normal.   4. Left atrial size was moderately dilated.   5. The mitral valve is normal in structure. Mild mitral valve  regurgitation. No evidence of mitral stenosis. Severe mitral annular  calcification.   6. The aortic valve is normal in structure. There is mild calcification  of the aortic valve. There is mild thickening of the aortic valve. Aortic  valve regurgitation is not visualized. Mild to moderate aortic valve  sclerosis/calcification is present,  without any evidence of aortic stenosis.   7. The inferior vena cava is normal in size with greater than 50%  respiratory variability, suggesting right atrial pressure of 3 mmHg.    Long Term Monitor 09/2019: Indication Palpitations   Duration: <14d   Findings Atrial tach with 4:1>>1:1 conduction with rates up to 150s     Echo 07/28/15: Study Conclusions   - Left ventricle: The cavity size was normal. There was moderate   concentric hypertrophy. Systolic function was severely reduced.   The estimated ejection fraction was in the range of 25% to 30%.   Diffuse hypokinesis. Features are consistent with a pseudonormal   left ventricular filling pattern, with concomitant abnormal   relaxation and increased filling pressure (grade 2 diastolic   dysfunction). Doppler parameters are consistent with high   ventricular filling pressure. - Aortic valve: Transvalvular velocity was within the normal range.   There was no stenosis. There  was no regurgitation. - Mitral valve: Calcified annulus. Mildly thickened leaflets .   There was trivial regurgitation. - Left atrium: The atrium was mildly dilated. - Right ventricle: The cavity size was normal. Wall thickness was   normal. Systolic function was normal. - Atrial septum: A patent foramen ovale cannot be excluded. - Tricuspid valve: There was mild regurgitation. Peak RV-RA   gradient (S): 30 mm Hg. - Inferior vena cava: The vessel was normal in size. The   respirophasic diameter changes were in the normal range (= 50%),   consistent with normal central venous pressure.  Lexiscan Myoview 09/08/14: LVEF 41%.  No ischemia.  Echo 03/07/20:  1. Since the prior study on 02/23/2019 LVEF has increased from 30-35% to  40-45%.   2. There is a false tending in the left ventricular apex. Left  ventricular ejection fraction, by estimation, is 40 to 45%. The left  ventricle has mildly decreased function. The left ventricle demonstrates  global hypokinesis. Left ventricular diastolic  parameters are consistent with Grade II diastolic dysfunction  (pseudonormalization). Elevated left atrial pressure. The average left  ventricular global longitudinal strain is -13.7 %. The global longitudinal  strain is abnormal.   3. Right ventricular systolic function is normal. The right ventricular  size is normal.   4. Left atrial size was moderately dilated.   5. The mitral valve is normal in structure. Mild mitral valve  regurgitation. No evidence of mitral stenosis. Severe mitral annular  calcification.   6. The aortic valve is normal in structure. There is mild calcification  of the aortic valve. There is mild thickening of the aortic valve. Aortic  valve regurgitation is not visualized. Mild to moderate aortic valve  sclerosis/calcification is present,  without any  evidence of aortic stenosis.   7. The inferior vena cava is normal in size with greater than 50%  respiratory variability,  suggesting right atrial pressure of 3 mmHg.   Labs/Other Tests and Data Reviewed:    EKG: EKG is personally reviewed.  01/30/22: Atrial tachycardia vs atrial flutter. Rate 129 bpm.   04/22/19: Ectopic atrial rhythm. Rate 95 bpm. PVCs. IVCD. QRS 130 ms.  Recent Labs: 03/14/2021: Hemoglobin 12.9; Platelets 227 11/20/2021: ALT 12; BUN 39; Creatinine, Ser 1.55; Potassium 4.5; Sodium 139   Recent Lipid Panel Lab Results  Component Value Date/Time   CHOL 131 03/14/2021 03:43 PM   TRIG 140 03/14/2021 03:43 PM   HDL 36 (L) 03/14/2021 03:43 PM   CHOLHDL 3.6 03/14/2021 03:43 PM   CHOLHDL 4.8 06/27/2009 03:28 AM   LDLCALC 70 03/14/2021 03:43 PM    Wt Readings from Last 3 Encounters:  01/30/22 204 lb (92.5 kg)  01/23/22 200 lb 12.8 oz (91.1 kg)  11/20/21 205 lb (93 kg)     Objective:    VS:  BP 115/71 (BP Location: Right Arm, Patient Position: Sitting, Cuff Size: Large)   Pulse (!) 129   Ht '5\' 8"'$  (1.727 m)   Wt 204 lb (92.5 kg)   BMI 31.02 kg/m  , BMI Body mass index is 31.02 kg/m. GENERAL:  Well appearing HEENT: Pupils equal round and reactive, fundi not visualized, oral mucosa unremarkable NECK:  No jugular venous distention, waveform within normal limits, carotid upstroke brisk and symmetric, no bruits, no thyromegaly LUNGS:  Clear to auscultation bilaterally HEART:  Tachycardic.  Irregularly irregular.   PMI not displaced or sustained,S1 and S2 within normal limits, no S3, no S4, no clicks, no rubs, no murmurs ABD:  Flat, positive bowel sounds normal in frequency in pitch, no bruits, no rebound, no guarding, no midline pulsatile mass, no hepatomegaly, no splenomegaly EXT:  2 plus pulses throughout, R ankle effusion.  TTP.  No cyanosis no clubbing SKIN:  No rashes no nodules NEURO:  Cranial nerves II through XII grossly intact, motor grossly intact throughout PSYCH:  Cognitively intact, oriented to person place and time     ASSESSMENT & PLAN:    No problem-specific Assessment  & Plan notes found for this encounter.  # Atrial flutter vs. atrial tachycardia: - The patient's heart rate remains elevated despite increasing metoprolol dosage. Increase metoprolol to '100mg'$  and continue digoxin.  Plan to schedule a cardioversion for early next week. She hasn't missed any doses of Eliquis.  Consult with EP for antiarrhythmic options as this is her second time requiring cardioversion for atrial tachycardia/atrial flutter.  She would like a second opinion from a different EP different provider.  Shared Decision Making/Informed Consent The risks (stroke, cardiac arrhythmias rarely resulting in the need for a temporary or permanent pacemaker, skin irritation or burns and complications associated with conscious sedation including aspiration, arrhythmia, respiratory failure and death), benefits (restoration of normal sinus rhythm) and alternatives of a direct current cardioversion were explained in detail to [name] and she agrees to proceed.    Medication Adjustments/Labs and Tests Ordered: Current medicines are reviewed at length with the patient today.  Concerns regarding medicines are outlined above.   Tests Ordered: Orders Placed This Encounter  Procedures   Basic metabolic panel   CBC with Differential/Platelet   Uric acid   Ambulatory referral to Cardiac Electrophysiology   EKG 12-Lead     Medication Changes: No orders of the defined types were placed in this  encounter.   Disposition: FU with Shanique Aslinger C. [name], MD, [facility] in a couple weeks.  I,Breanna Adamick,acting as a scribe for [name], MD.,have documented all relevant documentation on the behalf of [name], MD,as directed by  [name], MD while in the presence of [name], MD.   I, [place] [name], MD have reviewed all documentation for this visit.  The documentation of the exam, diagnosis, procedures, and orders on 01/30/2022 are all accurate and  complete.   Signed, [name], MD  01/30/2022 12:31 PM    [facility]

## 2022-01-31 LAB — CBC WITH DIFFERENTIAL/PLATELET
Basophils Absolute: 0.1 10*3/uL (ref 0.0–0.2)
Basos: 1 %
EOS (ABSOLUTE): 0.1 10*3/uL (ref 0.0–0.4)
Eos: 1 %
Hematocrit: 40.1 % (ref 34.0–46.6)
Hemoglobin: 12.5 g/dL (ref 11.1–15.9)
Immature Grans (Abs): 0.1 10*3/uL (ref 0.0–0.1)
Immature Granulocytes: 1 %
Lymphocytes Absolute: 1.9 10*3/uL (ref 0.7–3.1)
Lymphs: 20 %
MCH: 25.5 pg — ABNORMAL LOW (ref 26.6–33.0)
MCHC: 31.2 g/dL — ABNORMAL LOW (ref 31.5–35.7)
MCV: 82 fL (ref 79–97)
Monocytes Absolute: 0.6 10*3/uL (ref 0.1–0.9)
Monocytes: 6 %
Neutrophils Absolute: 6.7 10*3/uL (ref 1.4–7.0)
Neutrophils: 71 %
Platelets: 239 10*3/uL (ref 150–450)
RBC: 4.91 x10E6/uL (ref 3.77–5.28)
RDW: 14 % (ref 11.7–15.4)
WBC: 9.3 10*3/uL (ref 3.4–10.8)

## 2022-01-31 LAB — BASIC METABOLIC PANEL
BUN/Creatinine Ratio: 17 (ref 12–28)
BUN: 26 mg/dL (ref 8–27)
CO2: 22 mmol/L (ref 20–29)
Calcium: 9.9 mg/dL (ref 8.7–10.3)
Chloride: 101 mmol/L (ref 96–106)
Creatinine, Ser: 1.51 mg/dL — ABNORMAL HIGH (ref 0.57–1.00)
Glucose: 90 mg/dL (ref 70–99)
Potassium: 4.3 mmol/L (ref 3.5–5.2)
Sodium: 141 mmol/L (ref 134–144)
eGFR: 35 mL/min/{1.73_m2} — ABNORMAL LOW (ref >59)

## 2022-01-31 LAB — URIC ACID: Uric Acid: 9.4 mg/dL — ABNORMAL HIGH (ref 3.1–7.9)

## 2022-02-06 ENCOUNTER — Encounter (HOSPITAL_COMMUNITY): Payer: Self-pay | Admitting: Internal Medicine

## 2022-02-08 DIAGNOSIS — I480 Paroxysmal atrial fibrillation: Secondary | ICD-10-CM | POA: Diagnosis not present

## 2022-02-08 DIAGNOSIS — Z01812 Encounter for preprocedural laboratory examination: Secondary | ICD-10-CM | POA: Diagnosis not present

## 2022-02-09 ENCOUNTER — Other Ambulatory Visit: Payer: Self-pay | Admitting: Internal Medicine

## 2022-02-09 LAB — CBC WITH DIFFERENTIAL/PLATELET
Basophils Absolute: 0 10*3/uL (ref 0.0–0.2)
Basos: 1 %
EOS (ABSOLUTE): 0.1 10*3/uL (ref 0.0–0.4)
Eos: 1 %
Hematocrit: 38.1 % (ref 34.0–46.6)
Hemoglobin: 11.7 g/dL (ref 11.1–15.9)
Immature Grans (Abs): 0 10*3/uL (ref 0.0–0.1)
Immature Granulocytes: 1 %
Lymphocytes Absolute: 2.1 10*3/uL (ref 0.7–3.1)
Lymphs: 24 %
MCH: 25.2 pg — ABNORMAL LOW (ref 26.6–33.0)
MCHC: 30.7 g/dL — ABNORMAL LOW (ref 31.5–35.7)
MCV: 82 fL (ref 79–97)
Monocytes Absolute: 0.6 10*3/uL (ref 0.1–0.9)
Monocytes: 7 %
Neutrophils Absolute: 5.8 10*3/uL (ref 1.4–7.0)
Neutrophils: 66 %
Platelets: 243 10*3/uL (ref 150–450)
RBC: 4.65 x10E6/uL (ref 3.77–5.28)
RDW: 14.3 % (ref 11.7–15.4)
WBC: 8.6 10*3/uL (ref 3.4–10.8)

## 2022-02-09 LAB — BASIC METABOLIC PANEL
BUN/Creatinine Ratio: 17 (ref 12–28)
BUN: 27 mg/dL (ref 8–27)
CO2: 20 mmol/L (ref 20–29)
Calcium: 9.2 mg/dL (ref 8.7–10.3)
Chloride: 102 mmol/L (ref 96–106)
Creatinine, Ser: 1.58 mg/dL — ABNORMAL HIGH (ref 0.57–1.00)
Glucose: 113 mg/dL — ABNORMAL HIGH (ref 70–99)
Potassium: 4.6 mmol/L (ref 3.5–5.2)
Sodium: 140 mmol/L (ref 134–144)
eGFR: 34 mL/min/{1.73_m2} — ABNORMAL LOW (ref >59)

## 2022-02-12 ENCOUNTER — Telehealth (HOSPITAL_BASED_OUTPATIENT_CLINIC_OR_DEPARTMENT_OTHER): Payer: Self-pay | Admitting: Cardiovascular Disease

## 2022-02-12 DIAGNOSIS — I480 Paroxysmal atrial fibrillation: Secondary | ICD-10-CM

## 2022-02-12 DIAGNOSIS — Z01812 Encounter for preprocedural laboratory examination: Secondary | ICD-10-CM

## 2022-02-12 NOTE — Telephone Encounter (Signed)
Patient is calling stating she has a head cold that is moving down into her chest. She reports she does not have a fever and is wanting to know if she is still able to go through with her cardioversion scheduled for tomorrow morning due to this. Please advise.

## 2022-02-12 NOTE — Telephone Encounter (Signed)
Spoke with patient and she started having s/s 02/09/2022 Head congestion with drainage down her throat Denies fever, no COVID test Stated she did feel bad  Spoke with Endo and will reschedule  Advised patient needed to reschedule Has been moved to Friday 9/22 at 10:30 am with Dr Johnsie Cancel Case #[id number]  Advised patient and explained would need labs repeated

## 2022-02-13 ENCOUNTER — Encounter (HOSPITAL_BASED_OUTPATIENT_CLINIC_OR_DEPARTMENT_OTHER): Payer: Self-pay | Admitting: *Deleted

## 2022-02-13 ENCOUNTER — Ambulatory Visit (HOSPITAL_COMMUNITY): Admission: RE | Admit: 2022-02-13 | Payer: Medicare Other | Source: Ambulatory Visit | Admitting: Internal Medicine

## 2022-02-13 DIAGNOSIS — I471 Supraventricular tachycardia: Secondary | ICD-10-CM

## 2022-02-13 SURGERY — CARDIOVERSION
Anesthesia: Monitor Anesthesia Care

## 2022-02-20 DIAGNOSIS — I480 Paroxysmal atrial fibrillation: Secondary | ICD-10-CM | POA: Diagnosis not present

## 2022-02-20 DIAGNOSIS — Z01812 Encounter for preprocedural laboratory examination: Secondary | ICD-10-CM | POA: Diagnosis not present

## 2022-02-20 LAB — CBC WITH DIFFERENTIAL/PLATELET
Basophils Absolute: 0.1 10*3/uL (ref 0.0–0.2)
Basos: 1 %
EOS (ABSOLUTE): 0.1 10*3/uL (ref 0.0–0.4)
Eos: 1 %
Hematocrit: 35.9 % (ref 34.0–46.6)
Hemoglobin: 11.3 g/dL (ref 11.1–15.9)
Immature Grans (Abs): 0 10*3/uL (ref 0.0–0.1)
Immature Granulocytes: 1 %
Lymphocytes Absolute: 2 10*3/uL (ref 0.7–3.1)
Lymphs: 23 %
MCH: 25.8 pg — ABNORMAL LOW (ref 26.6–33.0)
MCHC: 31.5 g/dL (ref 31.5–35.7)
MCV: 82 fL (ref 79–97)
Monocytes Absolute: 0.7 10*3/uL (ref 0.1–0.9)
Monocytes: 8 %
Neutrophils Absolute: 5.8 10*3/uL (ref 1.4–7.0)
Neutrophils: 66 %
Platelets: 256 10*3/uL (ref 150–450)
RBC: 4.38 x10E6/uL (ref 3.77–5.28)
RDW: 13.9 % (ref 11.7–15.4)
WBC: 8.7 10*3/uL (ref 3.4–10.8)

## 2022-02-20 LAB — BASIC METABOLIC PANEL
BUN/Creatinine Ratio: 18 (ref 12–28)
BUN: 27 mg/dL (ref 8–27)
CO2: 22 mmol/L (ref 20–29)
Calcium: 9.4 mg/dL (ref 8.7–10.3)
Chloride: 100 mmol/L (ref 96–106)
Creatinine, Ser: 1.46 mg/dL — ABNORMAL HIGH (ref 0.57–1.00)
Glucose: 127 mg/dL — ABNORMAL HIGH (ref 70–99)
Potassium: 4.3 mmol/L (ref 3.5–5.2)
Sodium: 139 mmol/L (ref 134–144)
eGFR: 37 mL/min/{1.73_m2} — ABNORMAL LOW (ref >59)

## 2022-02-22 ENCOUNTER — Encounter (HOSPITAL_COMMUNITY)
Admission: RE | Disposition: A | Discharge status: Home / Self Care | Payer: Self-pay | Source: Ambulatory Visit | Attending: Cardiovascular Disease

## 2022-02-22 ENCOUNTER — Ambulatory Visit (HOSPITAL_COMMUNITY)
Admission: RE | Admit: 2022-02-22 | Discharge: 2022-02-22 | Disposition: A | Discharge status: Home / Self Care | Payer: Medicare Other | Source: Ambulatory Visit | Attending: Cardiovascular Disease | Admitting: Cardiovascular Disease

## 2022-02-22 DIAGNOSIS — R001 Bradycardia, unspecified: Secondary | ICD-10-CM | POA: Diagnosis not present

## 2022-02-22 DIAGNOSIS — Z538 Procedure and treatment not carried out for other reasons: Secondary | ICD-10-CM | POA: Diagnosis not present

## 2022-02-22 SURGERY — CANCELLED PROCEDURE

## 2022-02-22 NOTE — Progress Notes (Signed)
Patient came in today for a cardioversion. Patient was in a Sinus Loletha Grayer and 12 lead EKG obtained. Dr. Johnsie Cancel notified and procedure cancelled.

## 2022-02-26 ENCOUNTER — Encounter (HOSPITAL_BASED_OUTPATIENT_CLINIC_OR_DEPARTMENT_OTHER): Payer: Self-pay | Admitting: Cardiovascular Disease

## 2022-02-26 ENCOUNTER — Ambulatory Visit (HOSPITAL_BASED_OUTPATIENT_CLINIC_OR_DEPARTMENT_OTHER): Payer: Medicare Other | Admitting: Cardiovascular Disease

## 2022-02-26 DIAGNOSIS — I5042 Chronic combined systolic (congestive) and diastolic (congestive) heart failure: Secondary | ICD-10-CM

## 2022-02-26 DIAGNOSIS — I10 Essential (primary) hypertension: Secondary | ICD-10-CM | POA: Diagnosis not present

## 2022-02-26 DIAGNOSIS — I471 Supraventricular tachycardia: Secondary | ICD-10-CM | POA: Diagnosis not present

## 2022-02-26 DIAGNOSIS — I4719 Other supraventricular tachycardia: Secondary | ICD-10-CM

## 2022-02-26 MED ORDER — METOPROLOL SUCCINATE ER 50 MG PO TB24: 50.0000 mg | ORAL_TABLET | Freq: Every day | ORAL | 3 refills | Status: DC

## 2022-02-26 NOTE — Assessment & Plan Note (Signed)
Blood pressure is too low.  We will reduce metoprolol back to 50 mg daily.  Continue enalapril and furosemide.

## 2022-02-26 NOTE — Assessment & Plan Note (Signed)
She has had multiple episodes of atrial tachycardia versus atrial flutter.  Fortunately she is now back in sinus rhythm and did not require cardioversion.  Continue Eliquis and metoprolol.  EP consult with Dr. Curt Bears is pending for consideration of antiarrhythmics or other therapies.

## 2022-02-26 NOTE — Patient Instructions (Signed)
Medication Instructions:  DECREASE YOUR METOPROLOL TO 50 MG 1 TABLET DAILY   *If you need a refill on your cardiac medications before your next appointment, please call your pharmacy*  Lab Work: NONE  Testing/Procedures: NONE  Follow-Up: At [facility], you and your health needs are our priority.  As part of our continuing mission to provide you with exceptional heart care, we have created designated Provider Care Teams.  These Care Teams include your primary Cardiologist (physician) and Advanced Practice Providers (APPs -  Physician Assistants and Nurse Practitioners) who all work together to provide you with the care you need, when you need it.  We recommend signing up for the patient portal called "MyChart".  Sign up information is provided on this After Visit Summary.  MyChart is used to connect with patients for Virtual Visits (Telemedicine).  Patients are able to view lab/test results, encounter notes, upcoming appointments, etc.  Non-urgent messages can be sent to your provider as well.   To learn more about what you can do with MyChart, go to [email or web address].    Your next appointment:   4 month(s)  The format for your next appointment:   In Person  Provider:   [name], MD

## 2022-02-26 NOTE — Assessment & Plan Note (Signed)
LVEF improved from 25% to 40 to 45%.  She is euvolemic and doing well.  Reduce metoprolol to 50 mg daily given that blood pressure is running low.  Continue enalapril and digoxin.  Can consider Jardiance or Farxiga at follow-up.

## 2022-02-26 NOTE — Progress Notes (Signed)
Cardiology Office Note   Date:  02/26/2022   ID:  ANGELIGUE Little, DOB 09/08/1945, MRN [id number]  PCP:  [name], MD  Cardiologist:  [name], MD  Electrophysiologist:  None   Evaluation Performed:  Follow-Up Visit  Chief Complaint:  elevated heart rate  History of Present Illness:    [name] is a 76 y.o. female with chronic systolic and diastolic heart failure (LVEF 25 to 30%, nonischemic), PAF, PAD, diabetes, hyperlipidemia, hypertension, and tobacco abuse here for follow up.  She was previously a patient of Dr. Ron Parker and then Dr. Irish Lack.  She was first diagnosed with heart failure 06/2009.  At that time her LVEF was 25%.  This was associated with atrial fibrillation.  She reports having an ablation with Dr. [name] in 1999, though those records are not available at this time.  She has undergone cardioversions for atrial fibrillation with Dr. Ron Parker.  LVEF has ranged from 25 to 35% but has not improved despite beta-blockers and ACE inhibitor.  She had an echo 11/2011 that revealed LVEF 50 to 55%.  However subsequent studies have ranged from 20-35%.  LVEF on Lexiscan Myoview 09/2014 was 41%.  She developed a left bundle branch block pattern in 2014.  Her cardiomyopathy is nonischemic.  She last saw Dr. Irish Lack 04/2018 and carvedilol was increased due to concern for atrial tachycardia.  She felt fatigued and shaky at the higher dose.  Her ACE inhibitor was reduced 2/2 CKD III.  Prior to that she called the office 12/2017 with volume overload.  She previously discussed ICD with Dr. Irish Lack but was not interested.  We continued this discussion and she continues to want to think about it.  She was considering CRT-D after coronavirus.  Carvedilol was switched to metoprolol due to hypotension and tachycardia.  Her heart rate remained elevated but her energy levels improved.  Digoxin was started due to poor heart rate control and heart failure.  Ms. Sorci had an episode of syncope.   She was referred for an ambulatory monitor which revealed atrial fibrillation with PVCs.  She saw Dr. [name] on 4/6 who thought she was actually in atrial tachycardia.  He recommended waiting on ICD to see if her LVEF  Improved and felt she was unlikely to benefit from CRT.  Ms. Boss underwent successful DCCV for atrial tachycardia on 10/15/19.  She had a lung screening CT that showed coronary calcification and emphysema.    She saw Dr. Gwenlyn Found for PAD on 09/2318.  He recommended continued medical management at this time as her symptoms were not significant and she had no CLI.  Two weeks prior she fell.   She was going up her steps and lost her balance.  She fell onto some flower.  She was unable to get up from the ground and had to call the fire department.  At that time her BP was 409 systolic.  She struggled with pain and elevated heart rates since that time.  She had a virtual visit and her heart rate had been in the 110s to 120s.  Metoprolol was increased.  At the last visit she remained in atrial flutter and was scheduled for cardioversion. However, she converted prior to the procedure. Today, she complains of cold-like symptoms including congestion and wheezing. Since increasing her metoprolol, she mentions that it has helped her feel better when moving around the house.  She reports that she has been eating and drinking well. While coming into the office today  she was feeling lightheaded/dizzy. Previously while off of furosemide for 2 weeks she was retaining fluid. Since resuming her furosemide, her swelling has resolved and her breathing has improved overall. She denies any palpitations, chest pain, shortness of breath, or peripheral edema. No headaches, syncope, orthopnea, or PND.    Past Medical History:  Diagnosis Date   Arthritis    "left knee" (03/12/2013)   Atrial fibrillation ([place])    Cardioverted to NSR 08/22/2009   Atrial tachycardia ([place]) 09/30/2019   Cardiomyopathy    CHF (congestive  heart failure) (HCC)    Chronic combined systolic and diastolic heart failure ([place]) 04/09/2021   CKD (chronic kidney disease)    Depression    Dizziness    DM2 (diabetes mellitus, type 2) (HCC)    fasting 80-100 (03/12/2013)   Drug therapy 1/11   coumadin   Ejection fraction < 50%    20-25%, echo.06/2009, /  EF 30-35%, diffuse hypokinesis, echo, Oct 16, 2010   GERD (gastroesophageal reflux disease)    Gout    "left knee" (03/12/2013)   History of bronchitis    History of emphysema    HLD (hyperlipidemia)    Hyperkalemia    Hypertension    LFT elevation    06/2009   Mitral regurgitation    mild - echo - 1/11 /  Mild, echo, May15, 2012   Overweight(278.02)    Pneumonia    "@ least twice" (03/12/2013)   Thyroid dysfunction    thyromegally,diffuse,nodule LLL,06/2009   Tobacco abuse    Tricuspid regurgitation    moderate - echo - 1/11   Past Surgical History:  Procedure Laterality Date   BIOPSY  09/29/2020   Procedure: BIOPSY;  Surgeon: Carol Ada, MD;  Location: [facility];  Service: Endoscopy;;   BREAST BIOPSY Right 1980's   BREAST LUMPECTOMY Right 1980's   CARDIOVERSION N/A 10/15/2019   Procedure: CARDIOVERSION;  Surgeon: [name], MD;  Location: [facility];  Service: Cardiovascular;  Laterality: N/A;   CATARACT EXTRACTION W/ INTRAOCULAR LENS  IMPLANT, BILATERAL Bilateral 2010-2012   CHOLECYSTECTOMY  ~ 1978   ESOPHAGOGASTRODUODENOSCOPY (EGD) WITH PROPOFOL N/A 09/29/2020   Procedure: ESOPHAGOGASTRODUODENOSCOPY (EGD) WITH PROPOFOL;  Surgeon: Carol Ada, MD;  Location: [facility];  Service: Endoscopy;  Laterality: N/A;   JOINT REPLACEMENT     TONSILLECTOMY  1960's   TOTAL KNEE ARTHROPLASTY Left 03/12/2013   TOTAL KNEE ARTHROPLASTY Left 03/12/2013   Procedure: TOTAL KNEE ARTHROPLASTY;  Surgeon: Newt Minion, MD;  Location: [facility];  Service: Orthopedics;  Laterality: Left;  Left total knee arthroplasty   TUBAL LIGATION     UPPER ESOPHAGEAL ENDOSCOPIC  ULTRASOUND (EUS) N/A 09/29/2020   Procedure: UPPER ESOPHAGEAL ENDOSCOPIC ULTRASOUND (EUS);  Surgeon: Carol Ada, MD;  Location: [facility];  Service: Endoscopy;  Laterality: N/A;   VAGINAL HYSTERECTOMY  1987     Current Meds  Medication Sig   acetaminophen (TYLENOL) 500 MG tablet Take 1,000 mg by mouth 3 (three) times daily as needed for pain.   BD PEN NEEDLE NANO U/F 32G X 4 MM MISC USE AS DIRECTED WITH INSULIN PENS   betamethasone dipropionate 0.05 % cream Apply 1 application topically 2 (two) times daily as needed (dry skin).   betamethasone valerate ointment (VALISONE) 0.1 % Apply 1 Application topically 2 (two) times daily. prn (Patient taking differently: Apply 1 Application topically daily as needed (Dry skin).)   Cholecalciferol (VITAMIN D) 2000 UNITS CAPS Take 2,000 Units by mouth daily.   diclofenac Sodium (VOLTAREN) 1 %  GEL Apply 4 g topically 4 (four) times daily. (Patient taking differently: Apply 4 g topically 4 (four) times daily as needed (pain).)   digoxin (LANOXIN) 0.125 MG tablet TAKE 1 TABLET BY MOUTH EVERY DAY   ELIQUIS 5 MG TABS tablet TAKE 1 TABLET BY MOUTH TWICE A DAY   enalapril (VASOTEC) 5 MG tablet TAKE 1 TABLET BY MOUTH EVERYDAY AT BEDTIME   furosemide (LASIX) 40 MG tablet TAKE 1 TABLET DAILY AS NEEDED FOR EDEMA FOR FLUID OVERLOAD. PLEASE KEEP UPCOMING APPOINTMENT   KLOR-CON M20 20 MEQ tablet TAKE 1 TABLET (20 MEQ TOTAL) BY MOUTH DAILY. WHEN TAKING FUROSEMIDE (LASIX). (Patient taking differently: Take 20 mEq by mouth daily as needed (Cramps). When taking double  furosemide (Lasix).)   LIVALO 2 MG TABS TAKE 1 TABLET BY MOUTH EVERY DAY (Patient taking differently: Take 2 mg by mouth every Monday, Wednesday, and Friday.)   Multiple Vitamin (MULITIVITAMIN WITH MINERALS) TABS Take 1 tablet by mouth daily. Centrum Silver   nystatin cream (MYCOSTATIN) Apply 1 application topically 2 (two) times daily as needed for dry skin (under breasts).   ONETOUCH VERIO test strip USE  AS DIRECTED TO CHECK BLOOD SUGARS 1 TIME PER DAY DX: E11.22   pantoprazole (PROTONIX) 20 MG tablet TAKE 1 TABLET BY MOUTH DAILY AS NEEDED FOR HEARTBURN.   tiZANidine (ZANAFLEX) 4 MG tablet TAKE 1 TABLET (4 MG TOTAL) BY MOUTH DAILY AS NEEDED FOR BACK PAIN   traMADol (ULTRAM) 50 MG tablet Take 1 tablet (50 mg total) by mouth every 6 (six) hours as needed.   VICTOZA 18 MG/3ML SOPN INJECT 1.2 MG UNDER THE SKIN ONCE DAILY (Patient taking differently: Inject 1.2 mg into the skin at bedtime.)   vitamin C (ASCORBIC ACID) 500 MG tablet Take 500 mg by mouth daily.   [DISCONTINUED] metoprolol succinate (TOPROL-XL) 50 MG 24 hr tablet Take 1.5 tablets (75 mg total) by mouth daily. Take with or immediately following a meal. (Patient taking differently: Take 100 mg by mouth every morning. Take with or immediately following a meal.)     Allergies:   Codeine, Augmentin [amoxicillin-pot clavulanate], Coreg [carvedilol], and Warfarin and related   Social History   Tobacco Use   Smoking status: Every Day    Packs/day: 0.50    Years: 40.00    Total pack years: 20.00    Types: Cigarettes   Smokeless tobacco: Never   Tobacco comments:    smoked 1.5ppd x 9, 1ppdx40, cut back to 1/2ppd past 5 years  Vaping Use   Vaping Use: Never used  Substance Use Topics   Alcohol use: No   Drug use: No     Family Hx: The patient's family history includes Colon cancer in an other family member; Heart attack in her son; Heart disease in an other family member; Hypertension in her sister and son; Prostate cancer in her father; Stroke in her maternal grandfather, mother, and paternal grandmother.  ROS:   Please see the history of present illness.    (+) Congestion (+) Lightheadedness/dizziness (+) Wheezing All other systems reviewed and are negative.   Prior CV studies:    The following studies were reviewed today:  Bilateral LE Arterial Doppler 09/10/2021: Summary:  Right: Atherosclerosis and medial  calcifications throughout.  75-99% stenosis in the mid CFA.  Two vessel run-off via ATA and peroneal artery.   Left: Atherosclerosis and medial calcifications throughout.  30-49% stenosis in the proximal SFA distal to ostium.  One vessel run-off via ATA with elevated  velocities in the proximal  segment suggesting 50-74% stenosis, based on velocity ratio of 3.4.    Echo 03/07/2020: 1. Since the prior study on 02/23/2019 LVEF has increased from 30-35% to  40-45%.   2. There is a false tending in the left ventricular apex. Left  ventricular ejection fraction, by estimation, is 40 to 45%. The left  ventricle has mildly decreased function. The left ventricle demonstrates  global hypokinesis. Left ventricular diastolic  parameters are consistent with Grade II diastolic dysfunction  (pseudonormalization). Elevated left atrial pressure. The average left  ventricular global longitudinal strain is -13.7 %. The global longitudinal  strain is abnormal.   3. Right ventricular systolic function is normal. The right ventricular  size is normal.   4. Left atrial size was moderately dilated.   5. The mitral valve is normal in structure. Mild mitral valve  regurgitation. No evidence of mitral stenosis. Severe mitral annular  calcification.   6. The aortic valve is normal in structure. There is mild calcification  of the aortic valve. There is mild thickening of the aortic valve. Aortic  valve regurgitation is not visualized. Mild to moderate aortic valve  sclerosis/calcification is present,  without any evidence of aortic stenosis.   7. The inferior vena cava is normal in size with greater than 50%  respiratory variability, suggesting right atrial pressure of 3 mmHg.    Long Term Monitor 09/2019: Indication Palpitations   Duration: <14d   Findings Atrial tach with 4:1>>1:1 conduction with rates up to 150s     Echo 07/28/15: Study Conclusions   - Left ventricle: The cavity size was normal.  There was moderate   concentric hypertrophy. Systolic function was severely reduced.   The estimated ejection fraction was in the range of 25% to 30%.   Diffuse hypokinesis. Features are consistent with a pseudonormal   left ventricular filling pattern, with concomitant abnormal   relaxation and increased filling pressure (grade 2 diastolic   dysfunction). Doppler parameters are consistent with high   ventricular filling pressure. - Aortic valve: Transvalvular velocity was within the normal range.   There was no stenosis. There was no regurgitation. - Mitral valve: Calcified annulus. Mildly thickened leaflets .   There was trivial regurgitation. - Left atrium: The atrium was mildly dilated. - Right ventricle: The cavity size was normal. Wall thickness was   normal. Systolic function was normal. - Atrial septum: A patent foramen ovale cannot be excluded. - Tricuspid valve: There was mild regurgitation. Peak RV-RA   gradient (S): 30 mm Hg. - Inferior vena cava: The vessel was normal in size. The   respirophasic diameter changes were in the normal range (= 50%),   consistent with normal central venous pressure.  Lexiscan Myoview 09/08/14: LVEF 41%.  No ischemia.  Echo 03/07/20:  1. Since the prior study on 02/23/2019 LVEF has increased from 30-35% to  40-45%.   2. There is a false tending in the left ventricular apex. Left  ventricular ejection fraction, by estimation, is 40 to 45%. The left  ventricle has mildly decreased function. The left ventricle demonstrates  global hypokinesis. Left ventricular diastolic  parameters are consistent with Grade II diastolic dysfunction  (pseudonormalization). Elevated left atrial pressure. The average left  ventricular global longitudinal strain is -13.7 %. The global longitudinal  strain is abnormal.   3. Right ventricular systolic function is normal. The right ventricular  size is normal.   4. Left atrial size was moderately dilated.   5. The  mitral valve is normal in structure. Mild mitral valve  regurgitation. No evidence of mitral stenosis. Severe mitral annular  calcification.   6. The aortic valve is normal in structure. There is mild calcification  of the aortic valve. There is mild thickening of the aortic valve. Aortic  valve regurgitation is not visualized. Mild to moderate aortic valve  sclerosis/calcification is present,  without any evidence of aortic stenosis.   7. The inferior vena cava is normal in size with greater than 50%  respiratory variability, suggesting right atrial pressure of 3 mmHg.   Labs/Other Tests and Data Reviewed:    EKG: EKG is personally reviewed.  02/26/2022: Sinus rhythm. Rate 71 bpm. First degree AV block. IVCD. 01/30/22: Atrial tachycardia vs atrial flutter. Rate 129 bpm.   04/22/19: Ectopic atrial rhythm. Rate 95 bpm. PVCs. IVCD. QRS 130 ms.  Recent Labs: 11/20/2021: ALT 12 02/20/2022: BUN 27; Creatinine, Ser 1.46; Hemoglobin 11.3; Platelets 256; Potassium 4.3; Sodium 139   Recent Lipid Panel Lab Results  Component Value Date/Time   CHOL 131 03/14/2021 03:43 PM   TRIG 140 03/14/2021 03:43 PM   HDL 36 (L) 03/14/2021 03:43 PM   CHOLHDL 3.6 03/14/2021 03:43 PM   CHOLHDL 4.8 06/27/2009 03:28 AM   LDLCALC 70 03/14/2021 03:43 PM    Wt Readings from Last 3 Encounters:  02/26/22 205 lb (93 kg)  01/30/22 204 lb (92.5 kg)  01/23/22 200 lb 12.8 oz (91.1 kg)     Objective:    VS:  BP 96/61 (BP Location: Right Arm, Patient Position: Sitting, Cuff Size: Large)   Pulse 71   Ht '5\' 8"'$  (1.727 m)   Wt 205 lb (93 kg)   BMI 31.17 kg/m  , BMI Body mass index is 31.17 kg/m. GENERAL:  Well appearing HEENT: Pupils equal round and reactive, fundi not visualized, oral mucosa unremarkable NECK:  No jugular venous distention, waveform within normal limits, carotid upstroke brisk and symmetric, no bruits, no thyromegaly LUNGS:  Clear to auscultation bilaterally HEART:  RRR. PMI not displaced or  sustained,S1 and S2 within normal limits, no S3, no S4, no clicks, no rubs, no murmurs ABD:  Flat, positive bowel sounds normal in frequency in pitch, no bruits, no rebound, no guarding, no midline pulsatile mass, no hepatomegaly, no splenomegaly EXT:  2 plus pulses throughout, R ankle effusion.  No cyanosis no clubbing SKIN:  No rashes no nodules NEURO:  Cranial nerves II through XII grossly intact, motor grossly intact throughout PSYCH:  Cognitively intact, oriented to person place and time     ASSESSMENT & PLAN:    Essential hypertension Blood pressure is too low.  We will reduce metoprolol back to 50 mg daily.  Continue enalapril and furosemide.    Chronic combined systolic and diastolic heart failure (HCC) LVEF improved from 25% to 40 to 45%.  She is euvolemic and doing well.  Reduce metoprolol to 50 mg daily given that blood pressure is running low.  Continue enalapril and digoxin.  Can consider Jardiance or Farxiga at follow-up.  Atrial tachycardia ([place]) She has had multiple episodes of atrial tachycardia versus atrial flutter.  Fortunately she is now back in sinus rhythm and did not require cardioversion.  Continue Eliquis and metoprolol.  EP consult with Dr. Curt Bears is pending for consideration of antiarrhythmics or other therapies.   Medication Adjustments/Labs and Tests Ordered: Current medicines are reviewed at length with the patient today.  Concerns regarding medicines are outlined above.   Tests Ordered: Orders Placed  This Encounter  Procedures   EKG 12-Lead     Medication Changes: Meds ordered this encounter  Medications   metoprolol succinate (TOPROL-XL) 50 MG 24 hr tablet    Sig: Take 1 tablet (50 mg total) by mouth daily. Take with or immediately following a meal.    Dispense:  90 tablet    Refill:  3    D/C PREVIOUS RX    Disposition: FU with Mareena Cavan C. [name], MD, [facility] in 4 months.   I,Mathew Stumpf,acting as a Education administrator for [name], MD.,have  documented all relevant documentation on the behalf of [name], MD,as directed by  [name], MD while in the presence of [name], MD.   I, [place] [name], MD have reviewed all documentation for this visit.  The documentation of the exam, diagnosis, procedures, and orders on 02/26/2022 are all accurate and complete.   Signed, [name], MD  02/26/2022 5:18 PM    [facility]

## 2022-03-19 ENCOUNTER — Other Ambulatory Visit: Payer: Self-pay | Admitting: Internal Medicine

## 2022-03-19 DIAGNOSIS — Z1231 Encounter for screening mammogram for malignant neoplasm of breast: Secondary | ICD-10-CM

## 2022-03-20 DIAGNOSIS — H16223 Keratoconjunctivitis sicca, not specified as Sjogren's, bilateral: Secondary | ICD-10-CM | POA: Diagnosis not present

## 2022-03-20 DIAGNOSIS — H35022 Exudative retinopathy, left eye: Secondary | ICD-10-CM | POA: Diagnosis not present

## 2022-03-20 DIAGNOSIS — H43813 Vitreous degeneration, bilateral: Secondary | ICD-10-CM | POA: Diagnosis not present

## 2022-03-20 LAB — HM DIABETES EYE EXAM

## 2022-03-21 ENCOUNTER — Ambulatory Visit
Admission: RE | Admit: 2022-03-21 | Discharge: 2022-03-21 | Disposition: A | Discharge status: Home / Self Care | Payer: Medicare Other | Source: Ambulatory Visit | Attending: Internal Medicine | Admitting: Internal Medicine

## 2022-03-21 DIAGNOSIS — Z1231 Encounter for screening mammogram for malignant neoplasm of breast: Secondary | ICD-10-CM | POA: Diagnosis not present

## 2022-03-25 ENCOUNTER — Encounter: Payer: Self-pay | Admitting: Internal Medicine

## 2022-03-25 ENCOUNTER — Ambulatory Visit (INDEPENDENT_AMBULATORY_CARE_PROVIDER_SITE_OTHER): Payer: Medicare Other | Admitting: Internal Medicine

## 2022-03-25 VITALS — BP 112/70 | HR 70 | Temp 97.4°F | Ht 66.0 in | Wt 204.2 lb

## 2022-03-25 DIAGNOSIS — I739 Peripheral vascular disease, unspecified: Secondary | ICD-10-CM | POA: Diagnosis not present

## 2022-03-25 DIAGNOSIS — H6123 Impacted cerumen, bilateral: Secondary | ICD-10-CM

## 2022-03-25 DIAGNOSIS — E1122 Type 2 diabetes mellitus with diabetic chronic kidney disease: Secondary | ICD-10-CM | POA: Diagnosis not present

## 2022-03-25 DIAGNOSIS — N1832 Chronic kidney disease, stage 3b: Secondary | ICD-10-CM | POA: Diagnosis not present

## 2022-03-25 DIAGNOSIS — E7800 Pure hypercholesterolemia, unspecified: Secondary | ICD-10-CM

## 2022-03-25 DIAGNOSIS — Z6832 Body mass index (BMI) 32.0-32.9, adult: Secondary | ICD-10-CM

## 2022-03-25 DIAGNOSIS — I130 Hypertensive heart and chronic kidney disease with heart failure and stage 1 through stage 4 chronic kidney disease, or unspecified chronic kidney disease: Secondary | ICD-10-CM

## 2022-03-25 DIAGNOSIS — Z794 Long term (current) use of insulin: Secondary | ICD-10-CM | POA: Diagnosis not present

## 2022-03-25 DIAGNOSIS — Z0000 Encounter for general adult medical examination without abnormal findings: Secondary | ICD-10-CM | POA: Diagnosis not present

## 2022-03-25 DIAGNOSIS — I480 Paroxysmal atrial fibrillation: Secondary | ICD-10-CM

## 2022-03-25 DIAGNOSIS — Z23 Encounter for immunization: Secondary | ICD-10-CM

## 2022-03-25 DIAGNOSIS — E6609 Other obesity due to excess calories: Secondary | ICD-10-CM

## 2022-03-25 LAB — POCT URINALYSIS DIPSTICK
Bilirubin, UA: NEGATIVE
Blood, UA: NEGATIVE
Glucose, UA: NEGATIVE
Ketones, UA: NEGATIVE
Leukocytes, UA: NEGATIVE
Nitrite, UA: NEGATIVE
Protein, UA: NEGATIVE
Spec Grav, UA: >=1.03 — AB (ref 1.010–1.025)
Urobilinogen, UA: 0.2 E.U./dL
pH, UA: 5.5 (ref 5.0–8.0)

## 2022-03-25 NOTE — Progress Notes (Signed)
Rich Brave [place]leton,acting as a Education administrator for Maximino Greenland, MD.,have documented all relevant documentation on the behalf of Maximino Greenland, MD,as directed by  Maximino Greenland, MD while in the presence of Maximino Greenland, MD.   Subjective:     Patient ID: [name] , female    DOB: [birth year] , 76 y.o.   MRN: [id number]   Chief Complaint  Patient presents with   Annual Exam   Diabetes   Hypertension    HPI  She is here today for a full physical examination.  She has no specific concerns or complaints at this time. She states she recently had diabetic eye exam with Dr. Venetia Maxon. She denies headaches, chest pain and shortness of breath. She states she is feeling pretty good at this time.   Diabetes She presents for her follow-up diabetic visit. She has type 2 diabetes mellitus. Her disease course has been stable. There are no hypoglycemic associated symptoms. Pertinent negatives for diabetes include no blurred vision. There are no hypoglycemic complications. Diabetic complications include nephropathy. Risk factors for coronary artery disease include diabetes mellitus, dyslipidemia, hypertension, obesity, post-menopausal and sedentary lifestyle. Meal planning includes avoidance of concentrated sweets. She participates in exercise intermittently. An ACE inhibitor/angiotensin II receptor blocker is being taken. Eye exam is current.  Hypertension This is a chronic problem. The current episode started more than 1 year ago. The problem has been gradually improving since onset. The problem is controlled. Pertinent negatives include no blurred vision. Risk factors for coronary artery disease include diabetes mellitus, dyslipidemia, obesity, post-menopausal state and sedentary lifestyle. The current treatment provides moderate improvement. Compliance problems include exercise.      Past Medical History:  Diagnosis Date   Arthritis    "left knee" (03/12/2013)   Atrial fibrillation ([place])     Cardioverted to NSR 08/22/2009   Atrial tachycardia 09/30/2019   Cardiomyopathy    CHF (congestive heart failure) (HCC)    Chronic combined systolic and diastolic heart failure ([place]) 04/09/2021   CKD (chronic kidney disease)    Depression    Dizziness    DM2 (diabetes mellitus, type 2) (HCC)    fasting 80-100 (03/12/2013)   Drug therapy 1/11   coumadin   Ejection fraction < 50%    20-25%, echo.06/2009, /  EF 30-35%, diffuse hypokinesis, echo, Oct 16, 2010   GERD (gastroesophageal reflux disease)    Gout    "left knee" (03/12/2013)   History of bronchitis    History of emphysema (HCC)    HLD (hyperlipidemia)    Hyperkalemia    Hypertension    LFT elevation    06/2009   Mitral regurgitation    mild - echo - 1/11 /  Mild, echo, May15, 2012   Overweight(278.02)    Pneumonia    "@ least twice" (03/12/2013)   Thyroid dysfunction    thyromegally,diffuse,nodule LLL,06/2009   Tobacco abuse    Tricuspid regurgitation    moderate - echo - 1/11     Family History  Problem Relation Age of Onset   Stroke Mother    Prostate cancer Father    Hypertension Sister    Stroke Maternal Grandfather    Stroke Paternal Grandmother    Heart attack Son    Hypertension Son        X2   Colon cancer Other    Heart disease Other    Breast cancer Neg Hx      Current Outpatient Medications:    acetaminophen (TYLENOL)  500 MG tablet, Take 1,000 mg by mouth 3 (three) times daily as needed for pain., Disp: , Rfl:    BD PEN NEEDLE NANO U/F 32G X 4 MM MISC, USE AS DIRECTED WITH INSULIN PENS, Disp: 100 each, Rfl: 11   betamethasone dipropionate 0.05 % cream, Apply 1 application topically 2 (two) times daily as needed (dry skin)., Disp: 15 g, Rfl: 0   betamethasone valerate ointment (VALISONE) 0.1 %, Apply 1 Application topically 2 (two) times daily. prn (Patient taking differently: Apply 1 Application topically daily as needed (Dry skin).), Disp: 30 g, Rfl: 0   Cholecalciferol (VITAMIN D) 2000 UNITS CAPS,  Take 2,000 Units by mouth daily., Disp: , Rfl:    diclofenac Sodium (VOLTAREN) 1 % GEL, Apply 4 g topically 4 (four) times daily. (Patient taking differently: Apply 4 g topically 4 (four) times daily as needed (pain).), Disp: 100 g, Rfl: 0   digoxin (LANOXIN) 0.125 MG tablet, TAKE 1 TABLET BY MOUTH EVERY DAY, Disp: 90 tablet, Rfl: 3   ELIQUIS 5 MG TABS tablet, TAKE 1 TABLET BY MOUTH TWICE A DAY, Disp: 180 tablet, Rfl: 1   enalapril (VASOTEC) 5 MG tablet, TAKE 1 TABLET BY MOUTH EVERYDAY AT BEDTIME, Disp: 90 tablet, Rfl: 3   furosemide (LASIX) 40 MG tablet, TAKE 1 TABLET DAILY AS NEEDED FOR EDEMA FOR FLUID OVERLOAD. PLEASE KEEP UPCOMING APPOINTMENT, Disp: 90 tablet, Rfl: 0   KLOR-CON M20 20 MEQ tablet, TAKE 1 TABLET (20 MEQ TOTAL) BY MOUTH DAILY. WHEN TAKING FUROSEMIDE (LASIX). (Patient taking differently: Take 20 mEq by mouth daily as needed (Cramps). When taking double  furosemide (Lasix).), Disp: 90 tablet, Rfl: 2   LIVALO 2 MG TABS, TAKE 1 TABLET BY MOUTH EVERY DAY (Patient taking differently: Take 2 mg by mouth every Monday, Wednesday, and Friday.), Disp: 90 tablet, Rfl: 2   metoprolol succinate (TOPROL-XL) 50 MG 24 hr tablet, Take 1 tablet (50 mg total) by mouth daily. Take with or immediately following a meal., Disp: 90 tablet, Rfl: 3   Multiple Vitamin (MULITIVITAMIN WITH MINERALS) TABS, Take 1 tablet by mouth daily. Centrum Silver, Disp: , Rfl:    nystatin cream (MYCOSTATIN), Apply 1 application topically 2 (two) times daily as needed for dry skin (under breasts)., Disp: , Rfl:    ONETOUCH VERIO test strip, USE AS DIRECTED TO CHECK BLOOD SUGARS 1 TIME PER DAY DX: E11.22, Disp: 50 strip, Rfl: 7   pantoprazole (PROTONIX) 20 MG tablet, TAKE 1 TABLET BY MOUTH DAILY AS NEEDED FOR HEARTBURN., Disp: 90 tablet, Rfl: 1   tiZANidine (ZANAFLEX) 4 MG tablet, TAKE 1 TABLET (4 MG TOTAL) BY MOUTH DAILY AS NEEDED FOR BACK PAIN, Disp: 30 tablet, Rfl: 1   traMADol (ULTRAM) 50 MG tablet, Take 1 tablet (50 mg  total) by mouth every 6 (six) hours as needed., Disp: 20 tablet, Rfl: 0   VICTOZA 18 MG/3ML SOPN, INJECT 1.2 MG UNDER THE SKIN ONCE DAILY (Patient taking differently: Inject 1.2 mg into the skin at bedtime.), Disp: 3 mL, Rfl: 3   vitamin C (ASCORBIC ACID) 500 MG tablet, Take 500 mg by mouth daily., Disp: , Rfl:    Allergies  Allergen Reactions   Codeine Hives and Nausea And Vomiting   Augmentin [Amoxicillin-Pot Clavulanate] Diarrhea   Coreg [Carvedilol] Other (See Comments)    No energy   Warfarin And Related Other (See Comments)    Severe rectal bleeding      The patient states she uses post menopausal status for birth control.  Last LMP was No LMP recorded. Patient has had a hysterectomy.. Negative for Dysmenorrhea. Negative for: breast discharge, breast lump(s), breast pain and breast self exam. Associated symptoms include abnormal vaginal bleeding. Pertinent negatives include abnormal bleeding (hematology), anxiety, decreased libido, depression, difficulty falling sleep, dyspareunia, history of infertility, nocturia, sexual dysfunction, sleep disturbances, urinary incontinence, urinary urgency, vaginal discharge and vaginal itching. Diet regular.The patient states her exercise level is    . The patient's tobacco use is:  Social History   Tobacco Use  Smoking Status Every Day   Packs/day: 0.50   Years: 40.00   Total pack years: 20.00   Types: Cigarettes  Smokeless Tobacco Never  Tobacco Comments   smoked 1.5ppd x 9, 1ppdx40, cut back to 1/2ppd past 5 years  . She has been exposed to passive smoke. The patient's alcohol use is:  Social History   Substance and Sexual Activity  Alcohol Use No    Review of Systems  Constitutional: Negative.   Eyes: Negative.  Negative for blurred vision.  Respiratory: Negative.    Cardiovascular: Negative.   Gastrointestinal: Negative.   Endocrine: Negative.   Genitourinary: Negative.   Musculoskeletal: Negative.   Skin: Negative.    Allergic/Immunologic: Negative.   Neurological: Negative.   Hematological: Negative.   Psychiatric/Behavioral: Negative.       Today's Vitals   03/25/22 1412  BP: 112/70  Pulse: 70  Temp: (!) 97.4 F (36.3 C)  Weight: 204 lb 3.2 oz (92.6 kg)  Height: '5\' 6"'$  (1.676 m)  PainSc: 0-No pain   Body mass index is 32.96 kg/m.  Wt Readings from Last 3 Encounters:  03/28/22 210 lb (95.3 kg)  03/25/22 204 lb 3.2 oz (92.6 kg)  02/26/22 205 lb (93 kg)     Objective:  Physical Exam Vitals and nursing note reviewed.  Constitutional:      Appearance: Normal appearance.  HENT:     Head: Normocephalic and atraumatic.     Right Ear: Ear canal and external ear normal. There is impacted cerumen.     Left Ear: Ear canal and external ear normal. There is impacted cerumen.     Nose:     Comments: Masked     Mouth/Throat:     Comments: Masked  Eyes:     Extraocular Movements: Extraocular movements intact.     Conjunctiva/sclera: Conjunctivae normal.     Pupils: Pupils are equal, round, and reactive to light.  Cardiovascular:     Rate and Rhythm: Normal rate and regular rhythm.     Pulses: Normal pulses.     Heart sounds: Normal heart sounds.  Pulmonary:     Effort: Pulmonary effort is normal.     Breath sounds: Normal breath sounds.  Abdominal:     General: Bowel sounds are normal.     Palpations: Abdomen is soft.  Genitourinary:    Comments: deferred Musculoskeletal:        General: Normal range of motion.     Cervical back: Normal range of motion and neck supple.  Feet:     Right foot:     Protective Sensation: 5 sites tested.  5 sites sensed.     Skin integrity: Dry skin present.     Toenail Condition: Right toenails are abnormally thick.     Left foot:     Protective Sensation: 5 sites tested.  5 sites sensed.     Skin integrity: Dry skin present.     Toenail Condition: Left toenails are abnormally thick.  Comments: Decreased dp pulses b/l Skin:    General: Skin is  warm and dry.     Comments: Generally dry, healed scarring  Neurological:     General: No focal deficit present.     Mental Status: She is alert and oriented to person, place, and time.  Psychiatric:        Mood and Affect: Mood normal.        Behavior: Behavior normal.      Assessment And Plan:     1. Encounter for general adult medical examination w/o abnormal findings Comments: A full exam was performed.  Importance of monthly self bresat exams was discussed with the patient. PATIENT IS ADVISED TO GET 30-45 MINUTES REGULAR EXERCISE NO LESS THAN FOUR TO FIVE DAYS PER WEEK - BOTH WEIGHTBEARING EXERCISES AND AEROBIC ARE RECOMMENDED.  PATIENT IS ADVISED TO FOLLOW A HEALTHY DIET WITH AT LEAST SIX FRUITS/VEGGIES PER DAY, DECREASE INTAKE OF RED MEAT, AND TO INCREASE FISH INTAKE TO TWO DAYS PER WEEK.  MEATS/FISH SHOULD NOT BE FRIED, BAKED OR BROILED IS PREFERABLE.  IT IS ALSO IMPORTANT TO CUT BACK ON YOUR SUGAR INTAKE. PLEASE AVOID ANYTHING WITH ADDED SUGAR, CORN SYRUP OR OTHER SWEETENERS. IF YOU MUST USE A SWEETENER, YOU CAN TRY STEVIA. IT IS ALSO IMPORTANT TO AVOID ARTIFICIALLY SWEETENERS AND DIET BEVERAGES. LASTLY, I SUGGEST WEARING SPF 50 SUNSCREEN ON EXPOSED PARTS AND ESPECIALLY WHEN IN THE DIRECT SUNLIGHT FOR AN EXTENDED PERIOD OF TIME.  PLEASE AVOID FAST FOOD RESTAURANTS AND INCREASE YOUR WATER INTAKE.  2. Type 2 diabetes mellitus with stage 3b chronic kidney disease, with long-term current use of insulin ([place]) Comments: Diabetic foot exam was performed. She will rto in 3-4 months for re-evaluation.Importance of medication/dietary compliance was d/w patient. I DISCUSSED WITH THE PATIENT AT LENGTH REGARDING THE GOALS OF GLYCEMIC CONTROL AND POSSIBLE LONG-TERM COMPLICATIONS.  I  ALSO STRESSED THE IMPORTANCE OF COMPLIANCE WITH HOME GLUCOSE MONITORING, DIETARY RESTRICTIONS INCLUDING AVOIDANCE OF SUGARY DRINKS/PROCESSED FOODS,  ALONG WITH REGULAR EXERCISE.  I  ALSO STRESSED THE IMPORTANCE OF ANNUAL EYE  EXAMS, SELF FOOT CARE AND COMPLIANCE WITH OFFICE VISITS.  - POCT Urinalysis Dipstick (81002) - Microalbumin / Creatinine Urine Ratio - Lipid panel - Hemoglobin A1c - Protein electrophoresis, serum  3. Hypertensive heart and renal disease with renal failure, stage 1 through stage 4 or unspecified chronic kidney disease, with heart failure ([place]) Comments: Chronic, well controlled. She is encouraged to follow low sodium diet.  - POCT Urinalysis Dipstick (81002) - Microalbumin / Creatinine Urine Ratio - Lipid panel  4. Paroxysmal atrial fibrillation (HCC) Comments: Chronic, properly anticoagulated w/ Eliquis. She is rate controlled as well.   5. Bilateral impacted cerumen Comments: After obtaining verbal consent, both ears were flushed by irrigation without complication. No TM abnormalities were identified.  6. Peripheral arterial disease ([place]) Comments: Chronic, importance of regular exercise was discussed with the patient. Interventional Cardiology notes reviewed, take all meds as prescribed.   7. Pure hypercholesterolemia Chronic, LDL goal <70. She is currently on Livalo, she has been unable to tolerate high intensity statins in the past.  - Lipid panel - Liver Profile  8. Class 1 obesity due to excess calories with serious comorbidity and body mass index (BMI) of 32.0 to 32.9 in adult Comments: She is encouraged to aim for at least 150 minutes of exercise per week.   9. Immunization due - Flu Vaccine QUAD High Dose(Fluad)  Patient was given opportunity to ask questions. Patient verbalized understanding of the  plan and was able to repeat key elements of the plan. All questions were answered to their satisfaction.   I, Maximino Greenland, MD, have reviewed all documentation for this visit. The documentation on 03/25/22 for the exam, diagnosis, procedures, and orders are all accurate and complete.   THE PATIENT IS ENCOURAGED TO PRACTICE SOCIAL DISTANCING DUE TO THE COVID-19 PANDEMIC.

## 2022-03-25 NOTE — Patient Instructions (Signed)

## 2022-03-28 ENCOUNTER — Ambulatory Visit: Payer: Medicare Other | Attending: Cardiology | Admitting: Cardiology

## 2022-03-28 ENCOUNTER — Encounter: Payer: Self-pay | Admitting: Cardiology

## 2022-03-28 VITALS — BP 138/72 | HR 60 | Ht 68.0 in | Wt 210.0 lb

## 2022-03-28 DIAGNOSIS — I483 Typical atrial flutter: Secondary | ICD-10-CM | POA: Diagnosis not present

## 2022-03-28 DIAGNOSIS — I480 Paroxysmal atrial fibrillation: Secondary | ICD-10-CM | POA: Diagnosis not present

## 2022-03-28 DIAGNOSIS — D6869 Other thrombophilia: Secondary | ICD-10-CM

## 2022-03-28 LAB — LIPID PANEL
Chol/HDL Ratio: 4.7 ratio — ABNORMAL HIGH (ref 0.0–4.4)
Cholesterol, Total: 182 mg/dL (ref 100–199)
HDL: 39 mg/dL — ABNORMAL LOW (ref >39)
LDL Chol Calc (NIH): 113 mg/dL — ABNORMAL HIGH (ref 0–99)
Triglycerides: 171 mg/dL — ABNORMAL HIGH (ref 0–149)
VLDL Cholesterol Cal: 30 mg/dL (ref 5–40)

## 2022-03-28 LAB — PROTEIN ELECTROPHORESIS, SERUM
A/G Ratio: 1.1 (ref 0.7–1.7)
Albumin ELP: 3.8 g/dL (ref 2.9–4.4)
Alpha 1: 0.3 g/dL (ref 0.0–0.4)
Alpha 2: 1 g/dL (ref 0.4–1.0)
Beta: 1.1 g/dL (ref 0.7–1.3)
Gamma Globulin: 1.2 g/dL (ref 0.4–1.8)
Globulin, Total: 3.5 g/dL (ref 2.2–3.9)
Total Protein: 7.3 g/dL (ref 6.0–8.5)

## 2022-03-28 LAB — MICROALBUMIN / CREATININE URINE RATIO
Creatinine, Urine: 211.4 mg/dL
Microalb/Creat Ratio: 7 mg/g creat (ref 0–29)
Microalbumin, Urine: 15.7 ug/mL

## 2022-03-28 LAB — HEPATIC FUNCTION PANEL
ALT: 16 IU/L (ref 0–32)
AST: 19 IU/L (ref 0–40)
Albumin: 4.6 g/dL (ref 3.8–4.8)
Alkaline Phosphatase: 99 IU/L (ref 44–121)
Bilirubin Total: 0.2 mg/dL (ref 0.0–1.2)
Bilirubin, Direct: <0.1 mg/dL (ref 0.00–0.40)

## 2022-03-28 LAB — HEMOGLOBIN A1C
Est. average glucose Bld gHb Est-mCnc: 131 mg/dL
Hgb A1c MFr Bld: 6.2 % — ABNORMAL HIGH (ref 4.8–5.6)

## 2022-03-28 NOTE — Patient Instructions (Signed)
Medication Instructions:  Your physician recommends that you continue on your current medications as directed. Please refer to the Current Medication list given to you today.  *If you need a refill on your cardiac medications before your next appointment, please call your pharmacy*   Lab Work: None ordered If you have labs (blood work) drawn today and your tests are completely normal, you will receive your results only by: [place] (if you have MyChart) OR A paper copy in the mail If you have any lab test that is abnormal or we need to change your treatment, we will call you to review the results.   Testing/Procedures: None ordered   Follow-Up: At [facility], you and your health needs are our priority.  As part of our continuing mission to provide you with exceptional heart care, we have created designated Provider Care Teams.  These Care Teams include your primary Cardiologist (physician) and Advanced Practice Providers (APPs -  Physician Assistants and Nurse Practitioners) who all work together to provide you with the care you need, when you need it.   Dr. Curt Bears is going to discuss your treatment plan further with Dr. [name]  Your next appointment:   To be  determined   The format for your next appointment:   In Person  Provider:   Allegra Lai, MD    Thank you for choosing [facility]!!   Trinidad Curet, RN [phone]  Other Instructions   Important Information About Sugar

## 2022-03-28 NOTE — Progress Notes (Signed)
Electrophysiology Office Note   Date:  03/28/2022   ID:  [name], DOB [birth year], MRN [id number]  PCP:  Shelby Chard, MD  Cardiologist:  [name] Primary Electrophysiologist:  [name] Shelby Leeds, MD    Chief Complaint: atrial flutter   History of Present Illness: [name] is a 76 y.o. female who is being seen today for the evaluation of atrial flutter at the request of Shelby Latch, MD. Presenting today for electrophysiology evaluation.  She has a history significant for chronic systolic and diastolic heart failure, atrial fibrillation/flutter/tachycardia, PAD, diabetes, hyperlipidemia, hypertension.  She was diagnosed with heart failure in 2011.  Her ejection fraction was 25% of the time.  This is associated with atrial fibrillation.  She reports having an ablation with Dr. Caryl Little in 1999, those records are not available.  She has undergone cardioversions for atrial fibrillation.  Ejection fraction has fluctuated from 25 to 35%.  Most recently ejection fraction is 40 to 45%.  This was thought due to a nonischemic cardiomyopathy.  When she saw cardiology, she was in atrial flutter.  She was scheduled current for cardioversion, but did convert to sinus rhythm without intervention.   Today, she denies symptoms of palpitations, chest pain, shortness of breath, orthopnea, PND, lower extremity edema, claudication, dizziness, presyncope, syncope, bleeding, or neurologic sequela. The patient is tolerating medications without difficulties.    Past Medical History:  Diagnosis Date   Arthritis    "left knee" (03/12/2013)   Atrial fibrillation ([place])    Cardioverted to NSR 08/22/2009   Atrial tachycardia 09/30/2019   Cardiomyopathy    CHF (congestive heart failure) (HCC)    Chronic combined systolic and diastolic heart failure ([place]) 04/09/2021   CKD (chronic kidney disease)    Depression    Dizziness    DM2 (diabetes mellitus, type 2) (HCC)    fasting 80-100  (03/12/2013)   Drug therapy 1/11   coumadin   Ejection fraction < 50%    20-25%, echo.06/2009, /  EF 30-35%, diffuse hypokinesis, echo, Oct 16, 2010   GERD (gastroesophageal reflux disease)    Gout    "left knee" (03/12/2013)   History of bronchitis    History of emphysema ([place])    HLD (hyperlipidemia)    Hyperkalemia    Hypertension    LFT elevation    06/2009   Mitral regurgitation    mild - echo - 1/11 /  Mild, echo, May15, 2012   Overweight(278.02)    Pneumonia    "@ least twice" (03/12/2013)   Thyroid dysfunction    thyromegally,diffuse,nodule LLL,06/2009   Tobacco abuse    Tricuspid regurgitation    moderate - echo - 1/11   Past Surgical History:  Procedure Laterality Date   BIOPSY  09/29/2020   Procedure: BIOPSY;  Surgeon: Carol Ada, MD;  Location: [facility];  Service: Endoscopy;;   BREAST BIOPSY Right 1980's   BREAST LUMPECTOMY Right 1980's   CARDIOVERSION N/A 10/15/2019   Procedure: CARDIOVERSION;  Surgeon: Shelby Latch, MD;  Location: [facility];  Service: Cardiovascular;  Laterality: N/A;   CATARACT EXTRACTION W/ INTRAOCULAR LENS  IMPLANT, BILATERAL Bilateral 2010-2012   CHOLECYSTECTOMY  ~ 1978   ESOPHAGOGASTRODUODENOSCOPY (EGD) WITH PROPOFOL N/A 09/29/2020   Procedure: ESOPHAGOGASTRODUODENOSCOPY (EGD) WITH PROPOFOL;  Surgeon: Carol Ada, MD;  Location: [facility];  Service: Endoscopy;  Laterality: N/A;   JOINT REPLACEMENT     TONSILLECTOMY  1960's   TOTAL KNEE ARTHROPLASTY Left 03/12/2013   TOTAL KNEE ARTHROPLASTY Left 03/12/2013  Procedure: TOTAL KNEE ARTHROPLASTY;  Surgeon: Shelby Minion, MD;  Location: [facility];  Service: Orthopedics;  Laterality: Left;  Left total knee arthroplasty   TUBAL LIGATION     UPPER ESOPHAGEAL ENDOSCOPIC ULTRASOUND (EUS) N/A 09/29/2020   Procedure: UPPER ESOPHAGEAL ENDOSCOPIC ULTRASOUND (EUS);  Surgeon: Carol Ada, MD;  Location: [facility];  Service: Endoscopy;  Laterality: N/A;   VAGINAL HYSTERECTOMY  1987      Current Outpatient Medications  Medication Sig Dispense Refill   acetaminophen (TYLENOL) 500 MG tablet Take 1,000 mg by mouth 3 (three) times daily as needed for pain.     BD PEN NEEDLE NANO U/F 32G X 4 MM MISC USE AS DIRECTED WITH INSULIN PENS 100 each 11   betamethasone dipropionate 0.05 % cream Apply 1 application topically 2 (two) times daily as needed (dry skin). 15 g 0   betamethasone valerate ointment (VALISONE) 0.1 % Apply 1 Application topically 2 (two) times daily. prn (Patient taking differently: Apply 1 Application topically daily as needed (Dry skin).) 30 g 0   Cholecalciferol (VITAMIN D) 2000 UNITS CAPS Take 2,000 Units by mouth daily.     diclofenac Sodium (VOLTAREN) 1 % GEL Apply 4 g topically 4 (four) times daily. (Patient taking differently: Apply 4 g topically 4 (four) times daily as needed (pain).) 100 g 0   digoxin (LANOXIN) 0.125 MG tablet TAKE 1 TABLET BY MOUTH EVERY DAY 90 tablet 3   ELIQUIS 5 MG TABS tablet TAKE 1 TABLET BY MOUTH TWICE A DAY 180 tablet 1   enalapril (VASOTEC) 5 MG tablet TAKE 1 TABLET BY MOUTH EVERYDAY AT BEDTIME 90 tablet 3   furosemide (LASIX) 40 MG tablet TAKE 1 TABLET DAILY AS NEEDED FOR EDEMA FOR FLUID OVERLOAD. PLEASE KEEP UPCOMING APPOINTMENT 90 tablet 0   KLOR-CON M20 20 MEQ tablet TAKE 1 TABLET (20 MEQ TOTAL) BY MOUTH DAILY. WHEN TAKING FUROSEMIDE (LASIX). (Patient taking differently: Take 20 mEq by mouth daily as needed (Cramps). When taking double  furosemide (Lasix).) 90 tablet 2   LIVALO 2 MG TABS TAKE 1 TABLET BY MOUTH EVERY DAY (Patient taking differently: Take 2 mg by mouth every Monday, Wednesday, and Friday.) 90 tablet 2   metoprolol succinate (TOPROL-XL) 50 MG 24 hr tablet Take 1 tablet (50 mg total) by mouth daily. Take with or immediately following a meal. 90 tablet 3   Multiple Vitamin (MULITIVITAMIN WITH MINERALS) TABS Take 1 tablet by mouth daily. Centrum Silver     nystatin cream (MYCOSTATIN) Apply 1 application topically 2  (two) times daily as needed for dry skin (under breasts).     ONETOUCH VERIO test strip USE AS DIRECTED TO CHECK BLOOD SUGARS 1 TIME PER DAY DX: E11.22 50 strip 7   pantoprazole (PROTONIX) 20 MG tablet TAKE 1 TABLET BY MOUTH DAILY AS NEEDED FOR HEARTBURN. 90 tablet 1   tiZANidine (ZANAFLEX) 4 MG tablet TAKE 1 TABLET (4 MG TOTAL) BY MOUTH DAILY AS NEEDED FOR BACK PAIN 30 tablet 1   traMADol (ULTRAM) 50 MG tablet Take 1 tablet (50 mg total) by mouth every 6 (six) hours as needed. 20 tablet 0   VICTOZA 18 MG/3ML SOPN INJECT 1.2 MG UNDER THE SKIN ONCE DAILY (Patient taking differently: Inject 1.2 mg into the skin at bedtime.) 3 mL 3   vitamin C (ASCORBIC ACID) 500 MG tablet Take 500 mg by mouth daily.     No current facility-administered medications for this visit.    Allergies:   Codeine, Augmentin [amoxicillin-pot clavulanate],  Coreg [carvedilol], and Warfarin and related   Social History:  The patient  reports that she has been smoking cigarettes. She has a 20.00 pack-year smoking history. She has never used smokeless tobacco. She reports that she does not drink alcohol and does not use drugs.   Family History:  The patient's family history includes Colon cancer in an other family member; Heart attack in her son; Heart disease in an other family member; Hypertension in her sister and son; Prostate cancer in her father; Stroke in her maternal grandfather, mother, and paternal grandmother.    ROS:  Please see the history of present illness.   Otherwise, review of systems is positive for none.   All other systems are reviewed and negative.    PHYSICAL EXAM: VS:  BP 138/72   Pulse 60   Ht '5\' 8"'$  (1.727 m)   Wt 210 lb (95.3 kg)   SpO2 96%   BMI 31.93 kg/m  , BMI Body mass index is 31.93 kg/m. GEN: Well nourished, well developed, in no acute distress  HEENT: normal  Neck: no JVD, carotid bruits, or masses Cardiac: RRR; no murmurs, rubs, or gallops,no edema  Respiratory:  clear to  auscultation bilaterally, normal work of breathing GI: soft, nontender, nondistended, + BS MS: no deformity or atrophy  Skin: warm and dry Neuro:  Strength and sensation are intact Psych: euthymic mood, full affect  EKG:  EKG is not ordered today. Personal review of the ekg ordered 9/*26/23 shows sinus rhythm, IVCD  Recent Labs: 11/20/2021: ALT 12 02/20/2022: BUN 27; Creatinine, Ser 1.46; Hemoglobin 11.3; Platelets 256; Potassium 4.3; Sodium 139    Lipid Panel     Component Value Date/Time   CHOL 131 03/14/2021 1543   TRIG 140 03/14/2021 1543   HDL 36 (L) 03/14/2021 1543   CHOLHDL 3.6 03/14/2021 1543   CHOLHDL 4.8 06/27/2009 0328   VLDL 14 06/27/2009 0328   LDLCALC 70 03/14/2021 1543     Wt Readings from Last 3 Encounters:  03/28/22 210 lb (95.3 kg)  03/25/22 204 lb 3.2 oz (92.6 kg)  02/26/22 205 lb (93 kg)      Other studies Reviewed: Additional studies/ records that were reviewed today include: TTE 03/07/20  Review of the above records today demonstrates:   1. Since the prior study on 02/23/2019 LVEF has increased from 30-35% to  40-45%.   2. There is a false tending in the left ventricular apex. Left  ventricular ejection fraction, by estimation, is 40 to 45%. The left  ventricle has mildly decreased function. The left ventricle demonstrates  global hypokinesis. Left ventricular diastolic  parameters are consistent with Grade II diastolic dysfunction  (pseudonormalization). Elevated left atrial pressure. The average left  ventricular global longitudinal strain is -13.7 %. The global longitudinal  strain is abnormal.   3. Right ventricular systolic function is normal. The right ventricular  size is normal.   4. Left atrial size was moderately dilated.   5. The mitral valve is normal in structure. Mild mitral valve  regurgitation. No evidence of mitral stenosis. Severe mitral annular  calcification.   6. The aortic valve is normal in structure. There is mild  calcification  of the aortic valve. There is mild thickening of the aortic valve. Aortic  valve regurgitation is not visualized. Mild to moderate aortic valve  sclerosis/calcification is present,  without any evidence of aortic stenosis.   7. The inferior vena cava is normal in size with greater than 50%  respiratory  variability, suggesting right atrial pressure of 3 mmHg.    ASSESSMENT AND PLAN:  1.  Chronic combined systolic and diastolic heart failure: Ejection fraction 40 to 45%.  Currently euvolemic.  Plan per primary cardiology.  2.  Hypertension: Currently well controlled.  Plan per primary cardiology.  3.  Atrial fibrillation/atrial flutter: Wore a cardiac monitor that showed atrial fibrillation.  Also has had atrial flutter.  She feels quite poorly when she is in her arrhythmia.  She would benefit from rhythm control.  I discussed with her the possibility of ablation.  At this point, she would like for me to discuss this further with her primary cardiologist.  Other options would potentially be amiodarone versus dofetilide.    Current medicines are reviewed at length with the patient today.   The patient does not have concerns regarding her medicines.  The following changes were made today:  none  Labs/ tests ordered today include:  No orders of the defined types were placed in this encounter.    Disposition:   FU with [facility] Schwinn 3 months  Signed, Karmel Patricelli Shelby Leeds, MD  03/28/2022 4:34 PM     [address] [address] [phone] (office) [phone] (fax)

## 2022-03-31 ENCOUNTER — Encounter: Payer: Self-pay | Admitting: Internal Medicine

## 2022-04-01 ENCOUNTER — Other Ambulatory Visit: Payer: Self-pay

## 2022-04-01 DIAGNOSIS — E7800 Pure hypercholesterolemia, unspecified: Secondary | ICD-10-CM

## 2022-04-10 ENCOUNTER — Other Ambulatory Visit: Payer: Self-pay

## 2022-04-10 ENCOUNTER — Ambulatory Visit (INDEPENDENT_AMBULATORY_CARE_PROVIDER_SITE_OTHER): Payer: Medicare Other

## 2022-04-10 VITALS — Ht 68.0 in | Wt 207.0 lb

## 2022-04-10 DIAGNOSIS — Z0000 Encounter for general adult medical examination without abnormal findings: Secondary | ICD-10-CM | POA: Diagnosis not present

## 2022-04-10 NOTE — Patient Instructions (Signed)
[name] , Thank you for taking time to come for your Medicare Wellness Visit. I appreciate your ongoing commitment to your health goals. Please review the following plan we discussed and let me know if I can assist you in the future.   Screening recommendations/referrals: Colonoscopy: completed 09/14/2020, due 09/15/2023 Mammogram: completed 03/21/2022 Bone Density: completed 03/23/2015 Recommended yearly ophthalmology/optometry visit for glaucoma screening and checkup Recommended yearly dental visit for hygiene and checkup  Vaccinations: Influenza vaccine: completed 03/25/2022 Pneumococcal vaccine: completed 04/24/2021 Tdap vaccine: completed 06/18/2012, due 06/18/2022 Shingles vaccine: completed   Covid-19: 04/24/2021, 10/27/2020, 03/28/2020, 08/10/2019, 07/19/2019  Advanced directives: Please bring a copy of your POA (Power of Attorney) and/or Living Will to your next appointment.   Conditions/risks identified: smoking  Next appointment: Follow up in one year for your annual wellness visit    Preventive Care 77 Years and Older, Female Preventive care refers to lifestyle choices and visits with your health care provider that can promote health and wellness. What does preventive care include? A yearly physical exam. This is also called an annual well check. Dental exams once or twice a year. Routine eye exams. Ask your health care provider how often you should have your eyes checked. Personal lifestyle choices, including: Daily care of your teeth and gums. Regular physical activity. Eating a healthy diet. Avoiding tobacco and drug use. Limiting alcohol use. Practicing safe sex. Taking low-dose aspirin every day. Taking vitamin and mineral supplements as recommended by your health care provider. What happens during an annual well check? The services and screenings done by your health care provider during your annual well check will depend on your age, overall health, lifestyle  risk factors, and family history of disease. Counseling  Your health care provider may ask you questions about your: Alcohol use. Tobacco use. Drug use. Emotional well-being. Home and relationship well-being. Sexual activity. Eating habits. History of falls. Memory and ability to understand (cognition). Work and work Statistician. Reproductive health. Screening  You may have the following tests or measurements: Height, weight, and BMI. Blood pressure. Lipid and cholesterol levels. These may be checked every 5 years, or more frequently if you are over 54 years old. Skin check. Lung cancer screening. You may have this screening every year starting at age 69 if you have a 30-pack-year history of smoking and currently smoke or have quit within the past 15 years. Fecal occult blood test (FOBT) of the stool. You may have this test every year starting at age 60. Flexible sigmoidoscopy or colonoscopy. You may have a sigmoidoscopy every 5 years or a colonoscopy every 10 years starting at age 20. Hepatitis C blood test. Hepatitis B blood test. Sexually transmitted disease (STD) testing. Diabetes screening. This is done by checking your blood sugar (glucose) after you have not eaten for a while (fasting). You may have this done every 1-3 years. Bone density scan. This is done to screen for osteoporosis. You may have this done starting at age 58. Mammogram. This may be done every 1-2 years. Talk to your health care provider about how often you should have regular mammograms. Talk with your health care provider about your test results, treatment options, and if necessary, the need for more tests. Vaccines  Your health care provider may recommend certain vaccines, such as: Influenza vaccine. This is recommended every year. Tetanus, diphtheria, and acellular pertussis (Tdap, Td) vaccine. You may need a Td booster every 10 years. Zoster vaccine. You may need this after age 30. Pneumococcal  13-valent  conjugate (PCV13) vaccine. One dose is recommended after age 42. Pneumococcal polysaccharide (PPSV23) vaccine. One dose is recommended after age 69. Talk to your health care provider about which screenings and vaccines you need and how often you need them. This information is not intended to replace advice given to you by your health care provider. Make sure you discuss any questions you have with your health care provider. Document Released: 06/16/2015 Document Revised: 02/07/2016 Document Reviewed: 03/21/2015 Elsevier Interactive Patient Education  2017 [place] Prevention in the Home Falls can cause injuries. They can happen to people of all ages. There are many things you can do to make your home safe and to help prevent falls. What can I do on the outside of my home? Regularly fix the edges of walkways and driveways and fix any cracks. Remove anything that might make you trip as you walk through a door, such as a raised step or threshold. Trim any bushes or trees on the path to your home. Use bright outdoor lighting. Clear any walking paths of anything that might make someone trip, such as rocks or tools. Regularly check to see if handrails are loose or broken. Make sure that both sides of any steps have handrails. Any raised decks and porches should have guardrails on the edges. Have any leaves, snow, or ice cleared regularly. Use sand or salt on walking paths during winter. Clean up any spills in your garage right away. This includes oil or grease spills. What can I do in the bathroom? Use night lights. Install grab bars by the toilet and in the tub and shower. Do not use towel bars as grab bars. Use non-skid mats or decals in the tub or shower. If you need to sit down in the shower, use a plastic, non-slip stool. Keep the floor dry. Clean up any water that spills on the floor as soon as it happens. Remove soap buildup in the tub or shower regularly. Attach  bath mats securely with double-sided non-slip rug tape. Do not have throw rugs and other things on the floor that can make you trip. What can I do in the bedroom? Use night lights. Make sure that you have a light by your bed that is easy to reach. Do not use any sheets or blankets that are too big for your bed. They should not hang down onto the floor. Have a firm chair that has side arms. You can use this for support while you get dressed. Do not have throw rugs and other things on the floor that can make you trip. What can I do in the kitchen? Clean up any spills right away. Avoid walking on wet floors. Keep items that you use a lot in easy-to-reach places. If you need to reach something above you, use a strong step stool that has a grab bar. Keep electrical cords out of the way. Do not use floor polish or wax that makes floors slippery. If you must use wax, use non-skid floor wax. Do not have throw rugs and other things on the floor that can make you trip. What can I do with my stairs? Do not leave any items on the stairs. Make sure that there are handrails on both sides of the stairs and use them. Fix handrails that are broken or loose. Make sure that handrails are as long as the stairways. Check any carpeting to make sure that it is firmly attached to the stairs. Fix any carpet that is  loose or worn. Avoid having throw rugs at the top or bottom of the stairs. If you do have throw rugs, attach them to the floor with carpet tape. Make sure that you have a light switch at the top of the stairs and the bottom of the stairs. If you do not have them, ask someone to add them for you. What else can I do to help prevent falls? Wear shoes that: Do not have high heels. Have rubber bottoms. Are comfortable and fit you well. Are closed at the toe. Do not wear sandals. If you use a stepladder: Make sure that it is fully opened. Do not climb a closed stepladder. Make sure that both sides of the  stepladder are locked into place. Ask someone to hold it for you, if possible. Clearly mark and make sure that you can see: Any grab bars or handrails. First and last steps. Where the edge of each step is. Use tools that help you move around (mobility aids) if they are needed. These include: Canes. Walkers. Scooters. Crutches. Turn on the lights when you go into a dark area. Replace any light bulbs as soon as they burn out. Set up your furniture so you have a clear path. Avoid moving your furniture around. If any of your floors are uneven, fix them. If there are any pets around you, be aware of where they are. Review your medicines with your doctor. Some medicines can make you feel dizzy. This can increase your chance of falling. Ask your doctor what other things that you can do to help prevent falls. This information is not intended to replace advice given to you by your health care provider. Make sure you discuss any questions you have with your health care provider. Document Released: 03/16/2009 Document Revised: 10/26/2015 Document Reviewed: 06/24/2014 Elsevier Interactive Patient Education  2017 [employer].

## 2022-04-10 NOTE — Progress Notes (Signed)
I connected with [name] today by telephone and verified that I am speaking with the correct person using two identifiers. Location patient: home Location provider: work Persons participating in the virtual visit: [name], Glenna Durand LPN.   I discussed the limitations, risks, security and privacy concerns of performing an evaluation and management service by telephone and the availability of in person appointments. I also discussed with the patient that there may be a patient responsible charge related to this service. The patient expressed understanding and verbally consented to this telephonic visit.    Interactive audio and video telecommunications were attempted between this provider and patient, however failed, due to patient having technical difficulties OR patient did not have access to video capability.  We continued and completed visit with audio only.     Vital signs may be patient reported or missing.  Subjective:   [name] is a 76 y.o. female who presents for Medicare Annual (Subsequent) preventive examination.  Review of Systems     Cardiac Risk Factors include: advanced age (>60mn, >>70women);diabetes mellitus;hypertension;obesity (BMI >30kg/m2);smoking/ tobacco exposure     Objective:    Today's Vitals   04/10/22 1518 04/10/22 1519  Weight: 207 lb (93.9 kg)   Height: '5\' 8"'$  (1.727 m)   PainSc:  4    Body mass index is 31.47 kg/m.     04/10/2022    3:29 PM 03/21/2021   11:39 AM 09/29/2020   11:20 AM 03/02/2020    3:42 PM 11/30/2019    2:56 PM 10/15/2019    8:12 AM 02/25/2019    3:41 PM  Advanced Directives  Does Patient Have a Medical Advance Directive? Yes Yes Yes Yes Yes Yes Yes  Type of [place]of [place] will [place] will [place]Living will HF[place]Living will HHappyLiving will HHazardLiving will Living will  Copy of HK[place]in Chart? No - copy requested No - copy requested  No - copy requested  No - copy requested     Current Medications (verified) Outpatient Encounter Medications as of 04/10/2022  Medication Sig   acetaminophen (TYLENOL) 500 MG tablet Take 1,000 mg by mouth 3 (three) times daily as needed for pain.   BD PEN NEEDLE NANO U/F 32G X 4 MM MISC USE AS DIRECTED WITH INSULIN PENS   betamethasone dipropionate 0.05 % cream Apply 1 application topically 2 (two) times daily as needed (dry skin).   betamethasone valerate ointment (VALISONE) 0.1 % Apply 1 Application topically 2 (two) times daily. prn (Patient taking differently: Apply 1 Application topically daily as needed (Dry skin).)   Cholecalciferol (VITAMIN D) 2000 UNITS CAPS Take 2,000 Units by mouth daily.   diclofenac Sodium (VOLTAREN) 1 % GEL Apply 4 g topically 4 (four) times daily. (Patient taking differently: Apply 4 g topically 4 (four) times daily as needed (pain).)   digoxin (LANOXIN) 0.125 MG tablet TAKE 1 TABLET BY MOUTH EVERY DAY   ELIQUIS 5 MG TABS tablet TAKE 1 TABLET BY MOUTH TWICE A DAY   enalapril (VASOTEC) 5 MG tablet TAKE 1 TABLET BY MOUTH EVERYDAY AT BEDTIME   furosemide (LASIX) 40 MG tablet TAKE 1 TABLET DAILY AS NEEDED FOR EDEMA FOR FLUID OVERLOAD. PLEASE KEEP UPCOMING APPOINTMENT   KLOR-CON M20 20 MEQ tablet TAKE 1 TABLET (20 MEQ TOTAL) BY MOUTH DAILY. WHEN TAKING FUROSEMIDE (LASIX). (Patient taking differently: Take 20 mEq by mouth daily  as needed (Cramps). When taking double  furosemide (Lasix).)   LIVALO 2 MG TABS TAKE 1 TABLET BY MOUTH EVERY DAY (Patient taking differently: Take 2 mg by mouth daily. Takes Monday- Sat. None on Sunday)   metoprolol succinate (TOPROL-XL) 50 MG 24 hr tablet Take 1 tablet (50 mg total) by mouth daily. Take with or immediately following a meal.   Multiple Vitamin (MULITIVITAMIN WITH MINERALS) TABS Take 1 tablet by mouth daily.  Centrum Silver   nystatin cream (MYCOSTATIN) Apply 1 application topically 2 (two) times daily as needed for dry skin (under breasts).   ONETOUCH VERIO test strip USE AS DIRECTED TO CHECK BLOOD SUGARS 1 TIME PER DAY DX: E11.22   pantoprazole (PROTONIX) 20 MG tablet TAKE 1 TABLET BY MOUTH DAILY AS NEEDED FOR HEARTBURN.   tiZANidine (ZANAFLEX) 4 MG tablet TAKE 1 TABLET (4 MG TOTAL) BY MOUTH DAILY AS NEEDED FOR BACK PAIN   traMADol (ULTRAM) 50 MG tablet Take 1 tablet (50 mg total) by mouth every 6 (six) hours as needed.   VICTOZA 18 MG/3ML SOPN INJECT 1.2 MG UNDER THE SKIN ONCE DAILY (Patient taking differently: Inject 1.2 mg into the skin at bedtime.)   vitamin C (ASCORBIC ACID) 500 MG tablet Take 500 mg by mouth daily.   No facility-administered encounter medications on file as of 04/10/2022.    Allergies (verified) Codeine, Augmentin [amoxicillin-pot clavulanate], Coreg [carvedilol], and Warfarin and related   History: Past Medical History:  Diagnosis Date   Arthritis    "left knee" (03/12/2013)   Atrial fibrillation (HCC)    Cardioverted to NSR 08/22/2009   Atrial tachycardia 09/30/2019   Cardiomyopathy    CHF (congestive heart failure) (HCC)    Chronic combined systolic and diastolic heart failure ([place]) 04/09/2021   CKD (chronic kidney disease)    Depression    Dizziness    DM2 (diabetes mellitus, type 2) (HCC)    fasting 80-100 (03/12/2013)   Drug therapy 1/11   coumadin   Ejection fraction < 50%    20-25%, echo.06/2009, /  EF 30-35%, diffuse hypokinesis, echo, Oct 16, 2010   GERD (gastroesophageal reflux disease)    Gout    "left knee" (03/12/2013)   History of bronchitis    History of emphysema ([place])    HLD (hyperlipidemia)    Hyperkalemia    Hypertension    LFT elevation    06/2009   Mitral regurgitation    mild - echo - 1/11 /  Mild, echo, May15, 2012   Overweight(278.02)    Pneumonia    "@ least twice" (03/12/2013)   Thyroid dysfunction     thyromegally,diffuse,nodule LLL,06/2009   Tobacco abuse    Tricuspid regurgitation    moderate - echo - 1/11   Past Surgical History:  Procedure Laterality Date   BIOPSY  09/29/2020   Procedure: BIOPSY;  Surgeon: Carol Ada, MD;  Location: [facility];  Service: Endoscopy;;   BREAST BIOPSY Right 1980's   BREAST LUMPECTOMY Right 1980's   CARDIOVERSION N/A 10/15/2019   Procedure: CARDIOVERSION;  Surgeon: [name], MD;  Location: [facility];  Service: Cardiovascular;  Laterality: N/A;   CATARACT EXTRACTION W/ INTRAOCULAR LENS  IMPLANT, BILATERAL Bilateral 2010-2012   CHOLECYSTECTOMY  ~ 1978   ESOPHAGOGASTRODUODENOSCOPY (EGD) WITH PROPOFOL N/A 09/29/2020   Procedure: ESOPHAGOGASTRODUODENOSCOPY (EGD) WITH PROPOFOL;  Surgeon: Carol Ada, MD;  Location: [facility];  Service: Endoscopy;  Laterality: N/A;   JOINT REPLACEMENT     TONSILLECTOMY  1960's   TOTAL KNEE ARTHROPLASTY Left  03/12/2013   TOTAL KNEE ARTHROPLASTY Left 03/12/2013   Procedure: TOTAL KNEE ARTHROPLASTY;  Surgeon: Newt Minion, MD;  Location: [facility];  Service: Orthopedics;  Laterality: Left;  Left total knee arthroplasty   TUBAL LIGATION     UPPER ESOPHAGEAL ENDOSCOPIC ULTRASOUND (EUS) N/A 09/29/2020   Procedure: UPPER ESOPHAGEAL ENDOSCOPIC ULTRASOUND (EUS);  Surgeon: Carol Ada, MD;  Location: [facility];  Service: Endoscopy;  Laterality: N/A;   VAGINAL HYSTERECTOMY  1987   Family History  Problem Relation Age of Onset   Stroke Mother    Prostate cancer Father    Hypertension Sister    Stroke Maternal Grandfather    Stroke Paternal Grandmother    Heart attack Son    Hypertension Son        X2   Colon cancer Other    Heart disease Other    Breast cancer Neg Hx    Social History   Socioeconomic History   Marital status: Divorced    Spouse name: Not on file   Number of children: Not on file   Years of education: Not on file   Highest education level: Not on file  Occupational History    Occupation: retired  Tobacco Use   Smoking status: Every Day    Packs/day: 0.50    Years: 40.00    Total pack years: 20.00    Types: Cigarettes   Smokeless tobacco: Never   Tobacco comments:    smoked 1.5ppd x 9, 1ppdx40, cut back to 1/2ppd past 5 years  Vaping Use   Vaping Use: Never used  Substance and Sexual Activity   Alcohol use: No   Drug use: No   Sexual activity: Not Currently  Other Topics Concern   Not on file  Social History Narrative   Not on file   Social Determinants of Health   Financial Resource Strain: Low Risk  (04/10/2022)   Overall Financial Resource Strain (CARDIA)    Difficulty of Paying Living Expenses: Not hard at all  Food Insecurity: No Food Insecurity (04/10/2022)   Hunger Vital Sign    Worried About Running Out of Food in the Last Year: Never true    Stow in the Last Year: Never true  Transportation Needs: No Transportation Needs (04/10/2022)   PRAPARE - Hydrologist (Medical): No    Lack of Transportation (Non-Medical): No  Physical Activity: Inactive (04/10/2022)   Exercise Vital Sign    Days of Exercise per Week: 0 days    Minutes of Exercise per Session: 0 min  Stress: No Stress Concern Present (04/10/2022)   [place]    Feeling of Stress : Not at all  Social Connections: Not on file    Tobacco Counseling Ready to quit: Yes Counseling given: Not Answered Tobacco comments: smoked 1.5ppd x 9, 1ppdx40, cut back to 1/2ppd past 5 years   Clinical Intake:  Pre-visit preparation completed: Yes  Pain : 0-10 Pain Score: 4  Pain Type: Chronic pain Pain Location: Foot Pain Orientation: Left, Right Pain Descriptors / Indicators: Aching Pain Onset: More than a month ago Pain Frequency: Intermittent     Nutritional Status: BMI > 30  Obese Nutritional Risks: Nausea/ vomitting/ diarrhea (nausea last week, resolved) Diabetes:  Yes  How often do you need to have someone help you when you read instructions, pamphlets, or other written materials from your doctor or pharmacy?: 1 - Never  Diabetic? Yes  Nutrition Risk Assessment:  Has the patient had any N/V/D within the last 2 months?  Yes  Does the patient have any non-healing wounds?  No  Has the patient had any unintentional weight loss or weight gain?  No   Diabetes:  Is the patient diabetic?  Yes  If diabetic, was a CBG obtained today?  No  Did the patient bring in their glucometer from home?  No  How often do you monitor your CBG's? daily.   Financial Strains and Diabetes Management:  Are you having any financial strains with the device, your supplies or your medication? No .  Does the patient want to be seen by Chronic Care Management for management of their diabetes?  No  Would the patient like to be referred to a Nutritionist or for Diabetic Management?  No   Diabetic Exams:  Diabetic Eye Exam: Overdue for diabetic eye exam. Pt has been advised about the importance in completing this exam. Patient advised to call and schedule an eye exam. Diabetic Foot Exam: Completed 03/14/2021   Interpreter Needed?: No  Information entered by :: NAllen LPN   Activities of Daily Living    04/10/2022    3:32 PM 03/25/2022    2:20 PM  In your present state of health, do you have any difficulty performing the following activities:  Hearing? 0 0  Vision? 0 0  Difficulty concentrating or making decisions? 0 0  Walking or climbing stairs? 1 0  Comment  she does not have stairs in her home, walks up 2 steps to get inside  Dressing or bathing? 0 1  Comment  WOULD LIKE SHOWER CHAIR  Doing errands, shopping? 0 0  Preparing Food and eating ? N N  Using the Toilet? N N  In the past six months, have you accidently leaked urine? Y Y  Do you have problems with loss of bowel control? N N  Managing your Medications? N N  Managing your Finances? N N  Housekeeping  or managing your Housekeeping? N N    Patient Care Team: [name], MD as PCP - General (Internal Medicine) [name], MD as PCP - Cardiology (Cardiology) Carol Ada, MD as Consulting Physician (Gastroenterology) Marylynn Pearson, MD as Consulting Physician (Ophthalmology)  Indicate any recent Medical Services you may have received from other than [facility] providers in the past year (date may be approximate).     Assessment:   This is a routine wellness examination for [facility].  Hearing/Vision screen Vision Screening - Comments:: Regular eye exams, Dr. Venetia Maxon  Dietary issues and exercise activities discussed: Current Exercise Habits: The patient does not participate in regular exercise at present   Goals Addressed             This Visit's Progress    Patient Stated       04/10/2022, wants to get below 200 pounds and exercise more       Depression Screen    04/10/2022    3:32 PM 11/20/2021    2:41 PM 03/21/2021   11:41 AM 03/14/2021    2:18 PM 03/02/2020    3:45 PM 02/25/2019    3:42 PM 02/04/2019   11:14 AM  PHQ 2/9 Scores  PHQ - 2 Score 0 0 0 0 0 0 0  PHQ- 9 Score      0     Fall Risk    04/10/2022    3:29 PM 11/20/2021    2:42 PM 03/21/2021   11:40 AM 03/14/2021  2:19 PM 03/02/2020    3:43 PM  Fall Risk   Falls in the past year? 1 1 0 0 1  Comment lost balance    blacked out  Number falls in past yr: 0 1  0 0  Injury with Fall? 0 0  0 0  Risk for fall due to : Medication side effect;Impaired mobility History of fall(s) Medication side effect No Fall Risks Medication side effect  Follow up Falls evaluation completed;Education provided;Falls prevention discussed Falls evaluation completed Falls evaluation completed;Education provided;Falls prevention discussed Falls evaluation completed Falls evaluation completed;Education provided;Falls prevention discussed    FALL RISK PREVENTION PERTAINING TO THE HOME:  Any stairs in or around the home? No  If  so, are there any without handrails?  Two steps up Home free of loose throw rugs in walkways, pet beds, electrical cords, etc? Yes  Adequate lighting in your home to reduce risk of falls? Yes   ASSISTIVE DEVICES UTILIZED TO PREVENT FALLS:  Life alert? No  Use of a cane, walker or w/c? Yes  Grab bars in the bathroom? Yes  Shower chair or bench in shower? Yes  Elevated toilet seat or a handicapped toilet? Yes   TIMED UP AND GO:  Was the test performed? No .      Cognitive Function:        04/10/2022    3:34 PM 03/21/2021   11:43 AM 03/02/2020    3:47 PM 02/25/2019    3:47 PM  6CIT Screen  What Year? 0 points 0 points 0 points 0 points  What month? 0 points 0 points 0 points 0 points  What time? 0 points 0 points 0 points 0 points  Count back from 20 2 points 0 points 2 points 0 points  Months in reverse 0 points 0 points 0 points 0 points  Repeat phrase 2 points 2 points 2 points 0 points  Total Score 4 points 2 points 4 points 0 points    Immunizations Immunization History  Administered Date(s) Administered   Fluad Quad(high Dose 65+) 03/07/2020, 03/14/2021, 03/25/2022   Influenza, High Dose Seasonal PF 04/29/2018, 03/11/2019   Influenza,inj,Quad PF,6+ Mos 03/13/2013   PFIZER Comirnaty(Gray Top)Covid-19 Tri-Sucrose Vaccine 10/27/2020   PFIZER(Purple Top)SARS-COV-2 Vaccination 07/19/2019, 08/10/2019, 03/28/2020   PNEUMOCOCCAL CONJUGATE-20 04/24/2021   Pfizer Covid-19 Vaccine Bivalent Booster 79yr & up 04/24/2021   Pneumococcal Conjugate-13 02/28/2019   Pneumococcal-Unspecified 08/01/2011   Zoster Recombinat (Shingrix) 10/04/2013, 03/02/2019    TDAP status: Up to date  Flu Vaccine status: Up to date  Pneumococcal vaccine status: Up to date  Covid-19 vaccine status: Completed vaccines  Qualifies for Shingles Vaccine? Yes   Zostavax completed No   Shingrix Completed?: Yes  Screening Tests Health Maintenance  Topic Date Due   COVID-19 Vaccine (6 - Pfizer  risk series) 06/19/2021   OPHTHALMOLOGY EXAM  11/15/2021   Medicare Annual Wellness (AWV)  03/21/2022   FOOT EXAM  03/14/2022   TETANUS/TDAP  06/18/2022   Lung Cancer Screening  06/29/2022   HEMOGLOBIN A1C  09/24/2022   Diabetic kidney evaluation - GFR measurement  02/21/2023   Diabetic kidney evaluation - Urine ACR  03/26/2023   COLONOSCOPY (Pts 45-496yrInsurance coverage will need to be confirmed)  09/15/2023   Pneumonia Vaccine 6554Years old  Completed   INFLUENZA VACCINE  Completed   DEXA SCAN  Completed   Hepatitis C Screening  Completed   Zoster Vaccines- Shingrix  Completed   HPV VACCINES  Aged Out  Health Maintenance  Health Maintenance Due  Topic Date Due   COVID-19 Vaccine (6 - Pfizer risk series) 06/19/2021   OPHTHALMOLOGY EXAM  11/15/2021   Medicare Annual Wellness (AWV)  03/21/2022   FOOT EXAM  03/14/2022    Colorectal cancer screening: Type of screening: Colonoscopy. Completed 09/14/2020. Repeat every 3 years  Mammogram status: Completed 03/21/2022. Repeat every year  Bone Density status: Completed 03/23/2015.   Lung Cancer Screening: (Low Dose CT Chest recommended if Age 64-80 years, 30 pack-year currently smoking OR have quit w/in 15years.) does not qualify.   Lung Cancer Screening Referral: no  Additional Screening:  Hepatitis C Screening: does qualify; Completed 06/28/2009  Vision Screening: Recommended annual ophthalmology exams for early detection of glaucoma and other disorders of the eye. Is the patient up to date with their annual eye exam?  No  Who is the provider or what is the name of the office in which the patient attends annual eye exams? Dr. Venetia Maxon If pt is not established with a provider, would they like to be referred to a provider to establish care? No .   Dental Screening: Recommended annual dental exams for proper oral hygiene  Community Resource Referral / Chronic Care Management: CRR required this visit?  No   CCM required  this visit?  No      Plan:     I have personally reviewed and noted the following in the patient's chart:   Medical and social history Use of alcohol, tobacco or illicit drugs  Current medications and supplements including opioid prescriptions. Patient is not currently taking opioid prescriptions. Functional ability and status Nutritional status Physical activity Advanced directives List of other physicians Hospitalizations, surgeries, and ER visits in previous 12 months Vitals Screenings to include cognitive, depression, and falls Referrals and appointments  In addition, I have reviewed and discussed with patient certain preventive protocols, quality metrics, and best practice recommendations. A written personalized care plan for preventive services as well as general preventive health recommendations were provided to patient.     Kellie Simmering, LPN   17/10/1023   Nurse Notes: none  Due to this being a virtual visit, the after visit summary with patients personalized plan was offered to patient via mail or my-chart.  to pick up at office at next visit

## 2022-04-24 ENCOUNTER — Other Ambulatory Visit: Payer: Self-pay | Admitting: Internal Medicine

## 2022-04-28 ENCOUNTER — Other Ambulatory Visit: Payer: Self-pay | Admitting: Cardiovascular Disease

## 2022-04-29 ENCOUNTER — Other Ambulatory Visit: Payer: Self-pay | Admitting: Internal Medicine

## 2022-04-29 NOTE — Telephone Encounter (Signed)
Rx request sent to pharmacy.  

## 2022-05-02 ENCOUNTER — Other Ambulatory Visit: Payer: Self-pay | Admitting: Internal Medicine

## 2022-05-06 ENCOUNTER — Other Ambulatory Visit: Payer: Self-pay | Admitting: Internal Medicine

## 2022-05-07 ENCOUNTER — Telehealth: Payer: Self-pay

## 2022-05-07 NOTE — Telephone Encounter (Signed)
Contacted pt just to give her a date and get her on the schedule. She is aware that Dr Curt Bears RN will call her after the first of the year to go over detailed instructions and answer her questions.

## 2022-05-30 ENCOUNTER — Other Ambulatory Visit: Payer: Self-pay

## 2022-05-31 ENCOUNTER — Other Ambulatory Visit: Payer: Self-pay | Admitting: Internal Medicine

## 2022-05-31 ENCOUNTER — Other Ambulatory Visit: Payer: Self-pay | Admitting: Cardiovascular Disease

## 2022-05-31 DIAGNOSIS — I480 Paroxysmal atrial fibrillation: Secondary | ICD-10-CM

## 2022-05-31 DIAGNOSIS — I4719 Other supraventricular tachycardia: Secondary | ICD-10-CM

## 2022-05-31 NOTE — Telephone Encounter (Signed)
Refill please.

## 2022-06-04 ENCOUNTER — Other Ambulatory Visit: Payer: Medicare Other

## 2022-06-04 DIAGNOSIS — E7800 Pure hypercholesterolemia, unspecified: Secondary | ICD-10-CM | POA: Diagnosis not present

## 2022-06-05 ENCOUNTER — Other Ambulatory Visit: Payer: Self-pay | Admitting: Internal Medicine

## 2022-06-05 LAB — LIPID PANEL
Chol/HDL Ratio: 4.2 ratio (ref 0.0–4.4)
Cholesterol, Total: 161 mg/dL (ref 100–199)
HDL: 38 mg/dL — ABNORMAL LOW (ref >39)
LDL Chol Calc (NIH): 95 mg/dL (ref 0–99)
Triglycerides: 159 mg/dL — ABNORMAL HIGH (ref 0–149)
VLDL Cholesterol Cal: 28 mg/dL (ref 5–40)

## 2022-06-07 ENCOUNTER — Other Ambulatory Visit: Payer: Self-pay | Admitting: Internal Medicine

## 2022-06-12 ENCOUNTER — Other Ambulatory Visit: Payer: Self-pay

## 2022-06-12 ENCOUNTER — Telehealth: Payer: Self-pay

## 2022-06-12 MED ORDER — SEMAGLUTIDE(0.25 OR 0.5MG/DOS) 2 MG/3ML ~~LOC~~ SOPN: 0.2500 mg | PEN_INJECTOR | SUBCUTANEOUS | 1 refills | Status: DC

## 2022-06-12 NOTE — Telephone Encounter (Signed)
Patient's insurance will no longer cover Victoza so Dr.Sanders is switching her to ozempic 0.'25mg'$  once weekly. I have scheduled her a nurse visit and sent in the prescription to the pharmacy. YL,RMA

## 2022-06-13 ENCOUNTER — Ambulatory Visit: Payer: Medicare Other

## 2022-06-13 VITALS — BP 132/80 | Temp 98.1°F | Ht 68.0 in | Wt 207.0 lb

## 2022-06-13 DIAGNOSIS — N1832 Chronic kidney disease, stage 3b: Secondary | ICD-10-CM

## 2022-06-13 NOTE — Patient Instructions (Signed)
Semaglutide Injection What is this medication? SEMAGLUTIDE (SEM a GLOO tide) treats type 2 diabetes. It works by increasing insulin levels in your body, which decreases your blood sugar (glucose). It also reduces the amount of sugar released into the blood and slows down your digestion. It can also be used to lower the risk of heart attack and stroke in people with type 2 diabetes. Changes to diet and exercise are often combined with this medication. This medicine may be used for other purposes; ask your health care provider or pharmacist if you have questions. COMMON BRAND NAME(S): OZEMPIC What should I tell my care team before I take this medication? They need to know if you have any of these conditions: Endocrine tumors (MEN 2) or if someone in your family had these tumors Eye disease, vision problems History of pancreatitis Kidney disease Stomach problems Thyroid cancer or if someone in your family had thyroid cancer An unusual or allergic reaction to semaglutide, other medications, foods, dyes, or preservatives Pregnant or trying to get pregnant Breast-feeding How should I use this medication? This medication is for injection under the skin of your upper leg (thigh), stomach area, or upper arm. It is given once every week (every 7 days). You will be taught how to prepare and give this medication. Use exactly as directed. Take your medication at regular intervals. Do not take it more often than directed. If you use this medication with insulin, you should inject this medication and the insulin separately. Do not mix them together. Do not give the injections right next to each other. Change (rotate) injection sites with each injection. It is important that you put your used needles and syringes in a special sharps container. Do not put them in a trash can. If you do not have a sharps container, call your pharmacist or care team to get one. A special MedGuide will be given to you by the  pharmacist with each prescription and refill. Be sure to read this information carefully each time. This medication comes with INSTRUCTIONS FOR USE. Ask your pharmacist for directions on how to use this medication. Read the information carefully. Talk to your pharmacist or care team if you have questions. Talk to your care team about the use of this medication in children. Special care may be needed. Overdosage: If you think you have taken too much of this medicine contact a poison control center or emergency room at once. NOTE: This medicine is only for you. Do not share this medicine with others. What if I miss a dose? If you miss a dose, take it as soon as you can within 5 days after the missed dose. Then take your next dose at your regular weekly time. If it has been longer than 5 days after the missed dose, do not take the missed dose. Take the next dose at your regular time. Do not take double or extra doses. If you have questions about a missed dose, contact your care team for advice. What may interact with this medication? Other medications for diabetes Many medications may cause changes in blood sugar, these include: Alcohol containing beverages Antiviral medications for HIV or AIDS Aspirin and aspirin-like medications Certain medications for blood pressure, heart disease, irregular heart beat Chromium Diuretics Female hormones, such as estrogens or progestins, birth control pills Fenofibrate Gemfibrozil Isoniazid Lanreotide Female hormones or anabolic steroids MAOIs like Carbex, Eldepryl, Marplan, Nardil, and Parnate Medications for weight loss Medications for allergies, asthma, cold, or cough Medications for depression,   anxiety, or psychotic disturbances Niacin Nicotine NSAIDs, medications for pain and inflammation, like ibuprofen or naproxen Octreotide Pasireotide Pentamidine Phenytoin Probenecid Quinolone antibiotics such as ciprofloxacin, levofloxacin, ofloxacin Some  herbal dietary supplements Steroid medications such as prednisone or cortisone Sulfamethoxazole; trimethoprim Thyroid hormones Some medications can hide the warning symptoms of low blood sugar (hypoglycemia). You may need to monitor your blood sugar more closely if you are taking one of these medications. These include: Beta-blockers, often used for high blood pressure or heart problems (examples include atenolol, metoprolol, propranolol) Clonidine Guanethidine Reserpine This list may not describe all possible interactions. Give your health care provider a list of all the medicines, herbs, non-prescription drugs, or dietary supplements you use. Also tell them if you smoke, drink alcohol, or use illegal drugs. Some items may interact with your medicine. What should I watch for while using this medication? Visit your care team for regular checks on your progress. Drink plenty of fluids while taking this medication. Check with your care team if you get an attack of severe diarrhea, nausea, and vomiting. The loss of too much body fluid can make it dangerous for you to take this medication. A test called the HbA1C (A1C) will be monitored. This is a simple blood test. It measures your blood sugar control over the last 2 to 3 months. You will receive this test every 3 to 6 months. Learn how to check your blood sugar. Learn the symptoms of low and high blood sugar and how to manage them. Always carry a quick-source of sugar with you in case you have symptoms of low blood sugar. Examples include hard sugar candy or glucose tablets. Make sure others know that you can choke if you eat or drink when you develop serious symptoms of low blood sugar, such as seizures or unconsciousness. They must get medical help at once. Tell your care team if you have high blood sugar. You might need to change the dose of your medication. If you are sick or exercising more than usual, you might need to change the dose of your  medication. Do not skip meals. Ask your care team if you should avoid alcohol. Many nonprescription cough and cold products contain sugar or alcohol. These can affect blood sugar. Pens should never be shared. Even if the needle is changed, sharing may result in passing of viruses like hepatitis or HIV. Wear a medical ID bracelet or chain, and carry a card that describes your disease and details of your medication and dosage times. Do not become pregnant while taking this medication. Women should inform their care team if they wish to become pregnant or think they might be pregnant. There is a potential for serious side effects to an unborn child. Talk to your care team for more information. What side effects may I notice from receiving this medication? Side effects that you should report to your care team as soon as possible: Allergic reactions--skin rash, itching, hives, swelling of the face, lips, tongue, or throat Change in vision Dehydration--increased thirst, dry mouth, feeling faint or lightheaded, headache, dark yellow or brown urine Gallbladder problems--severe stomach pain, nausea, vomiting, fever Heart palpitations--rapid, pounding, or irregular heartbeat Kidney injury--decrease in the amount of urine, swelling of the ankles, hands, or feet Pancreatitis--severe stomach pain that spreads to your back or gets worse after eating or when touched, fever, nausea, vomiting Thyroid cancer--new mass or lump in the neck, pain or trouble swallowing, trouble breathing, hoarseness Side effects that usually do not require medical   attention (report to your care team if they continue or are bothersome): Diarrhea Loss of appetite Nausea Stomach pain Vomiting This list may not describe all possible side effects. Call your doctor for medical advice about side effects. You may report side effects to FDA at 1-800-FDA-1088. Where should I keep my medication? Keep out of the reach of children. Store  unopened pens in a refrigerator between 2 and 8 degrees C (36 and 46 degrees F). Do not freeze. Protect from light and heat. After you first use the pen, it can be stored for 56 days at room temperature between 15 and 30 degrees C (59 and 86 degrees F) or in a refrigerator. Throw away your used pen after 56 days or after the expiration date, whichever comes first. Do not store your pen with the needle attached. If the needle is left on, medication may leak from the pen. NOTE: This sheet is a summary. It may not cover all possible information. If you have questions about this medicine, talk to your doctor, pharmacist, or health care provider.  2023 Elsevier/Gold Standard (2020-08-03 00:00:00)  

## 2022-06-13 NOTE — Progress Notes (Signed)
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 Provider notified. Patient will keep Feb appointment.

## 2022-07-02 ENCOUNTER — Ambulatory Visit (HOSPITAL_BASED_OUTPATIENT_CLINIC_OR_DEPARTMENT_OTHER): Payer: Medicare Other | Admitting: Cardiovascular Disease

## 2022-07-11 ENCOUNTER — Other Ambulatory Visit: Payer: Self-pay

## 2022-07-11 MED ORDER — EZETIMIBE 10 MG PO TABS: 10.0000 mg | ORAL_TABLET | Freq: Every day | ORAL | 1 refills | Status: DC

## 2022-07-29 ENCOUNTER — Encounter: Payer: Self-pay | Admitting: Internal Medicine

## 2022-07-29 ENCOUNTER — Ambulatory Visit (INDEPENDENT_AMBULATORY_CARE_PROVIDER_SITE_OTHER): Payer: Medicare Other | Admitting: Internal Medicine

## 2022-07-29 VITALS — BP 104/68 | HR 112 | Temp 97.3°F | Ht 68.0 in | Wt 212.2 lb

## 2022-07-29 DIAGNOSIS — I130 Hypertensive heart and chronic kidney disease with heart failure and stage 1 through stage 4 chronic kidney disease, or unspecified chronic kidney disease: Secondary | ICD-10-CM | POA: Diagnosis not present

## 2022-07-29 DIAGNOSIS — E1122 Type 2 diabetes mellitus with diabetic chronic kidney disease: Secondary | ICD-10-CM

## 2022-07-29 DIAGNOSIS — N1832 Chronic kidney disease, stage 3b: Secondary | ICD-10-CM

## 2022-07-29 DIAGNOSIS — N2581 Secondary hyperparathyroidism of renal origin: Secondary | ICD-10-CM | POA: Diagnosis not present

## 2022-07-29 DIAGNOSIS — Z79899 Other long term (current) drug therapy: Secondary | ICD-10-CM

## 2022-07-29 DIAGNOSIS — I739 Peripheral vascular disease, unspecified: Secondary | ICD-10-CM | POA: Diagnosis not present

## 2022-07-29 DIAGNOSIS — Z23 Encounter for immunization: Secondary | ICD-10-CM

## 2022-07-29 DIAGNOSIS — Z720 Tobacco use: Secondary | ICD-10-CM

## 2022-07-29 DIAGNOSIS — E7800 Pure hypercholesterolemia, unspecified: Secondary | ICD-10-CM | POA: Diagnosis not present

## 2022-07-29 DIAGNOSIS — Z5941 Food insecurity: Secondary | ICD-10-CM

## 2022-07-29 DIAGNOSIS — Z794 Long term (current) use of insulin: Secondary | ICD-10-CM

## 2022-07-29 DIAGNOSIS — I480 Paroxysmal atrial fibrillation: Secondary | ICD-10-CM | POA: Diagnosis not present

## 2022-07-29 DIAGNOSIS — D6869 Other thrombophilia: Secondary | ICD-10-CM | POA: Insufficient documentation

## 2022-07-29 DIAGNOSIS — I5042 Chronic combined systolic (congestive) and diastolic (congestive) heart failure: Secondary | ICD-10-CM | POA: Diagnosis not present

## 2022-07-29 DIAGNOSIS — Z6832 Body mass index (BMI) 32.0-32.9, adult: Secondary | ICD-10-CM

## 2022-07-29 DIAGNOSIS — E6609 Other obesity due to excess calories: Secondary | ICD-10-CM

## 2022-07-29 NOTE — Progress Notes (Unsigned)
I,Victoria T Hamilton,acting as a scribe for Maximino Greenland, MD.,have documented all relevant documentation on the behalf of Maximino Greenland, MD,as directed by  Maximino Greenland, MD while in the presence of Maximino Greenland, MD.    Subjective:     Patient ID: [name] , female    DOB: 05/31/1946 , 77 y.o.   MRN: [id number]   Chief Complaint  Patient presents with  . Diabetes  . Hypertension  . Hyperlipidemia    HPI  The patient is here for a follow-up on her diabetes and blood pressure. She reports compliance with meds. She denies headaches, chest pain and shortness of breath. She reports feeling like she has jumped back into Afib. She has noticed her bp readings fluctuating.   Diabetes She presents for her follow-up diabetic visit. She has type 2 diabetes mellitus. Her disease course has been stable. There are no hypoglycemic associated symptoms. Pertinent negatives for diabetes include no blurred vision. There are no hypoglycemic complications. Diabetic complications include nephropathy. Risk factors for coronary artery disease include diabetes mellitus, dyslipidemia, hypertension, obesity, post-menopausal and sedentary lifestyle. Meal planning includes avoidance of concentrated sweets. She participates in exercise intermittently. An ACE inhibitor/angiotensin II receptor blocker is being taken. Eye exam is current.  Hypertension This is a chronic problem. The current episode started more than 1 year ago. The problem has been gradually improving since onset. The problem is controlled. Pertinent negatives include no blurred vision. Risk factors for coronary artery disease include diabetes mellitus, dyslipidemia, obesity, post-menopausal state and sedentary lifestyle. The current treatment provides moderate improvement. Compliance problems include exercise.   Hyperlipidemia    Past Medical History:  Diagnosis Date  . Arthritis    "left knee" (03/12/2013)  . Atrial fibrillation  ([place])    Cardioverted to NSR 08/22/2009  . Atrial tachycardia 09/30/2019  . Cardiomyopathy   . CHF (congestive heart failure) ([place])   . Chronic combined systolic and diastolic heart failure ([place]) 04/09/2021  . CKD (chronic kidney disease)   . Depression   . Dizziness   . DM2 (diabetes mellitus, type 2) ([place])    fasting 80-100 (03/12/2013)  . Drug therapy 1/11   coumadin  . Ejection fraction < 50%    20-25%, echo.06/2009, /  EF 30-35%, diffuse hypokinesis, echo, Oct 16, 2010  . GERD (gastroesophageal reflux disease)   . Gout    "left knee" (03/12/2013)  . History of bronchitis   . History of emphysema ([place])   . HLD (hyperlipidemia)   . Hyperkalemia   . Hypertension   . LFT elevation    06/2009  . Mitral regurgitation    mild - echo - 1/11 /  Mild, echo, May15, 2012  . Overweight(278.02)   . Pneumonia    "@ least twice" (03/12/2013)  . Thyroid dysfunction    thyromegally,diffuse,nodule LLL,06/2009  . Tobacco abuse   . Tricuspid regurgitation    moderate - echo - 1/11     Family History  Problem Relation Age of Onset  . Stroke Mother   . Prostate cancer Father   . Hypertension Sister   . Stroke Maternal Grandfather   . Stroke Paternal Grandmother   . Heart attack Son   . Hypertension Son        X19  . Colon cancer Other   . Heart disease Other   . Breast cancer Neg Hx      Current Outpatient Medications:  .  ezetimibe (ZETIA) 10 MG tablet, Take 1  tablet (10 mg total) by mouth daily., Disp: 90 tablet, Rfl: 1 .  acetaminophen (TYLENOL) 500 MG tablet, Take 1,000 mg by mouth 3 (three) times daily as needed for pain., Disp: , Rfl:  .  BD PEN NEEDLE NANO U/F 32G X 4 MM MISC, USE AS DIRECTED WITH INSULIN PENS, Disp: 100 each, Rfl: 11 .  betamethasone dipropionate 0.05 % cream, Apply 1 application topically 2 (two) times daily as needed (dry skin)., Disp: 15 g, Rfl: 0 .  betamethasone valerate ointment (VALISONE) 0.1 %, Apply 1 Application topically 2 (two) times daily. prn  (Patient taking differently: Apply 1 Application topically daily as needed (Dry skin).), Disp: 30 g, Rfl: 0 .  Cholecalciferol (VITAMIN D) 2000 UNITS CAPS, Take 2,000 Units by mouth daily., Disp: , Rfl:  .  diclofenac Sodium (VOLTAREN) 1 % GEL, Apply 4 g topically 4 (four) times daily. (Patient taking differently: Apply 4 g topically 4 (four) times daily as needed (pain).), Disp: 100 g, Rfl: 0 .  digoxin (LANOXIN) 0.125 MG tablet, TAKE 1 TABLET BY MOUTH EVERY DAY, Disp: 90 tablet, Rfl: 3 .  ELIQUIS 5 MG TABS tablet, TAKE 1 TABLET BY MOUTH TWICE A DAY, Disp: 180 tablet, Rfl: 1 .  enalapril (VASOTEC) 5 MG tablet, TAKE 1 TABLET BY MOUTH EVERYDAY AT BEDTIME, Disp: 90 tablet, Rfl: 3 .  furosemide (LASIX) 40 MG tablet, TAKE 1 TABLET DAILY AS NEEDED FOR EDEMA FOR FLUID OVERLOAD. PLEASE KEEP UPCOMING APPOINTMENT, Disp: 90 tablet, Rfl: 0 .  KLOR-CON M20 20 MEQ tablet, TAKE 1 TABLET (20 MEQ TOTAL) BY MOUTH DAILY. WHEN TAKING FUROSEMIDE (LASIX). (Patient taking differently: Take 20 mEq by mouth daily as needed (Cramps). When taking double  furosemide (Lasix).), Disp: 90 tablet, Rfl: 2 .  liraglutide (VICTOZA) 18 MG/3ML SOPN, INJECT 1.2 MG INTO THE SKIN AT BEDTIME., Disp: 9 mL, Rfl: 3 .  LIVALO 2 MG TABS, TAKE 1 TABLET BY MOUTH EVERY DAY (Patient taking differently: Take 2 mg by mouth daily. Takes Monday- Sat. None on Sunday), Disp: 90 tablet, Rfl: 2 .  metoprolol succinate (TOPROL-XL) 50 MG 24 hr tablet, Take 1 tablet (50 mg total) by mouth daily. Take with or immediately following a meal., Disp: 90 tablet, Rfl: 3 .  Multiple Vitamin (MULITIVITAMIN WITH MINERALS) TABS, Take 1 tablet by mouth daily. Centrum Silver, Disp: , Rfl:  .  nystatin cream (MYCOSTATIN), Apply 1 application topically 2 (two) times daily as needed for dry skin (under breasts)., Disp: , Rfl:  .  ONETOUCH VERIO test strip, USE AS DIRECTED TO CHECK BLOOD SUGARS 1 TIME PER DAY, Disp: 50 strip, Rfl: 7 .  pantoprazole (PROTONIX) 20 MG tablet, TAKE  1 TABLET BY MOUTH DAILY AS NEEDED FOR HEARTBURN., Disp: 90 tablet, Rfl: 1 .  Semaglutide,0.25 or 0.'5MG'$ /DOS, 2 MG/3ML SOPN, Inject 0.25 mg into the skin once a week., Disp: 3 mL, Rfl: 1 .  tiZANidine (ZANAFLEX) 4 MG tablet, TAKE 1 TABLET (4 MG TOTAL) BY MOUTH DAILY AS NEEDED FOR BACK PAIN, Disp: 30 tablet, Rfl: 1 .  traMADol (ULTRAM) 50 MG tablet, Take 1 tablet (50 mg total) by mouth every 6 (six) hours as needed., Disp: 20 tablet, Rfl: 0 .  vitamin C (ASCORBIC ACID) 500 MG tablet, Take 500 mg by mouth daily., Disp: , Rfl:    Allergies  Allergen Reactions  . Codeine Hives and Nausea And Vomiting  . Augmentin [Amoxicillin-Pot Clavulanate] Diarrhea  . Coreg [Carvedilol] Other (See Comments)    No energy  .  Warfarin And Related Other (See Comments)    Severe rectal bleeding     Review of Systems  Constitutional: Negative.   Eyes:  Negative for blurred vision.  Respiratory: Negative.    Cardiovascular: Negative.   Gastrointestinal: Negative.   Skin: Negative.   Neurological: Negative.   Psychiatric/Behavioral: Negative.       Today's Vitals   07/29/22 1537  BP: 104/68  Pulse: (!) 112  Temp: (!) 97.3 F (36.3 C)  SpO2: 98%  Weight: 212 lb 3.2 oz (96.3 kg)  Height: '5\' 8"'$  (1.727 m)   Body mass index is 32.26 kg/m.   Objective:  Physical Exam Vitals and nursing note reviewed.  Constitutional:      Appearance: Normal appearance.  HENT:     Head: Normocephalic and atraumatic.     Nose:     Comments: Masked     Mouth/Throat:     Comments: Masked  Eyes:     Extraocular Movements: Extraocular movements intact.  Cardiovascular:     Rate and Rhythm: Normal rate and regular rhythm.     Heart sounds: Normal heart sounds.  Pulmonary:     Effort: Pulmonary effort is normal.     Breath sounds: Normal breath sounds.  Skin:    General: Skin is warm.  Neurological:     General: No focal deficit present.     Mental Status: She is alert.  Psychiatric:        Mood and Affect:  Mood normal.        Behavior: Behavior normal.     Assessment And Plan:     1. Type 2 diabetes mellitus with stage 3b chronic kidney disease, with long-term current use of insulin ([place])  2. Hypertensive heart and renal disease with renal failure, stage 1 through stage 4 or unspecified chronic kidney disease, with heart failure ([place])  3. Pure hypercholesterolemia  4. Peripheral arterial disease (HCC)  5. Class 1 obesity due to excess calories with serious comorbidity and body mass index (BMI) of 32.0 to 32.9 in adult Comments: She is encouraged to strive for BMI less than 30 to decrease cardiac risk. Advised to aim for at least 150 minutes of exercise per week.  6. Need for Tdap vaccination - Tdap vaccine greater than or equal to 7yo IM   Patient was given opportunity to ask questions. Patient verbalized understanding of the plan and was able to repeat key elements of the plan. All questions were answered to their satisfaction.   I, Maximino Greenland, MD, have reviewed all documentation for this visit. The documentation on 07/29/22 for the exam, diagnosis, procedures, and orders are all accurate and complete.   IF YOU HAVE BEEN REFERRED TO A SPECIALIST, IT MAY TAKE 1-2 WEEKS TO SCHEDULE/PROCESS THE REFERRAL. IF YOU HAVE NOT HEARD FROM US/SPECIALIST IN TWO WEEKS, PLEASE GIVE [place] A CALL AT [phone].   THE PATIENT IS ENCOURAGED TO PRACTICE SOCIAL DISTANCING DUE TO THE COVID-19 PANDEMIC.

## 2022-07-29 NOTE — Patient Instructions (Signed)
Diabetes Mellitus Action Plan Following a diabetes action plan is a way for you to manage your diabetes (diabetes mellitus) symptoms. The plan is color-coded to help you understand what actions you need to take based on any symptoms you are having. If you have symptoms in the red zone, you need medical care right away. If you have symptoms in the yellow zone, you are having problems. If you have symptoms in the green zone, you are doing well. Learning about and understanding diabetes can take time. Follow the plan that you develop with your health care provider. Know the target range for your blood sugar (glucose) level, and review your treatment plan with your health care provider at each visit. The target range for my blood sugar level is __________________________ mg/dL. Red zone Get medical help right away if you have any of the following symptoms: A blood sugar test result that is below 54 mg/dL (3 mmol/L). A blood sugar test result that is at or above 240 mg/dL (13.3 mmol/L) for 2 days in a row. Confusion or trouble thinking clearly. Difficulty breathing. Sickness or a fever for 2 or more days that is not getting better. Moderate or large ketone levels in your urine. Feeling tired or having no energy. If you have any red zone symptoms, do not wait to see if the symptoms will go away. Get medical help right away. Call your local emergency services (911 in the [place] Do not drive yourself to the hospital. If you have severely low blood sugar (severe hypoglycemia) and you cannot eat or drink, you may need glucagon. Make sure a family member or close friend knows how to check your blood sugar and how to give you glucagon. You may need to be treated in a hospital for this condition. Yellow zone If you have any of the following symptoms, your diabetes is not under control and you may need to make some changes: A blood sugar test result that is at or above 240 mg/dL (13.3 mmol/L) for 2 days in a  row. Blood sugar test results that are below 70 mg/dL (3.9 mmol/L). Other symptoms of hypoglycemia, such as: Shaking or feeling light-headed. Confusion or irritability. Feeling hungry. Having a fast heartbeat. If you have any yellow zone symptoms: Treat your hypoglycemia by eating or drinking 15 grams of a rapid-acting carbohydrate. Follow the 15:15 rule: Take 15 grams of a rapid-acting carbohydrate, such as: 1 tube of glucose gel. 4 glucose pills. 4 oz (120 mL) of fruit juice. 4 oz (120 mL) of regular (not diet) soda. Check your blood sugar 15 minutes after you take the carbohydrate. If the repeat blood sugar test is still at or below 70 mg/dL (3.9 mmol/L), take 15 grams of a carbohydrate again. If your blood sugar does not increase above 70 mg/dL (3.9 mmol/L) after 3 tries, get medical help right away. After your blood sugar returns to normal, eat a meal or a snack within 1 hour. Keep taking your daily medicines as told by your health care provider. Check your blood sugar more often than you normally would. Write down your results. Call your health care provider if you have trouble keeping your blood sugar in your target range.  Green zone These signs mean you are doing well and you can continue what you are doing to manage your diabetes: Your blood sugar is within your personal target range. For most people, a blood sugar level before a meal (preprandial) should be 80-130 mg/dL (4.4-7.2 mmol/L). You feel   well, and you are able to do daily activities. If you are in the green zone, continue to manage your diabetes as told by your health care provider. To do this: Eat a healthy diet. Exercise regularly. Check your blood sugar as told by your health care provider. Take your medicines as told by your health care provider.  Where to find more information American Diabetes Association (ADA): diabetes.org Association of Diabetes Care & Education Specialists (ADCES):  diabeteseducator.org Summary Following a diabetes action plan is a way for you to manage your diabetes symptoms. The plan is color-coded to help you understand what actions you need to take based on any symptoms you are having. Follow the plan that you develop with your health care provider. Make sure you know your personal target blood sugar level. Review your treatment plan with your health care provider at each visit. This information is not intended to replace advice given to you by your health care provider. Make sure you discuss any questions you have with your health care provider. Document Revised: 11/25/2019 Document Reviewed: 11/25/2019 Elsevier Patient Education  2023 Elsevier Inc.  

## 2022-07-30 ENCOUNTER — Telehealth: Payer: Self-pay

## 2022-07-30 ENCOUNTER — Telehealth: Payer: Self-pay | Admitting: *Deleted

## 2022-07-30 LAB — URINALYSIS, ROUTINE W REFLEX MICROSCOPIC
Bilirubin, UA: NEGATIVE
Glucose, UA: NEGATIVE
Ketones, UA: NEGATIVE
Leukocytes,UA: NEGATIVE
Nitrite, UA: NEGATIVE
RBC, UA: NEGATIVE
Specific Gravity, UA: 1.021 (ref 1.005–1.030)
Urobilinogen, Ur: 0.2 mg/dL (ref 0.2–1.0)
pH, UA: 5.5 (ref 5.0–7.5)

## 2022-07-30 LAB — MICROALBUMIN / CREATININE URINE RATIO
Creatinine, Urine: 264.5 mg/dL
Microalb/Creat Ratio: 11 mg/g creat (ref 0–29)
Microalbumin, Urine: 28.3 ug/mL

## 2022-07-30 NOTE — Progress Notes (Cosign Needed)
Care Management & Coordination Services Pharmacy Team  Reason for Encounter: Appointment Reminder  Contacted patient to confirm in office appointment with Orlando Penner, PharmD on 07/31/2022 at 10:00 am. No need to contact- patient spoke with scheduling representative today to confirm appointment.    Chart review:  Recent office visits:  07/29/2022- [name], MD (PCP)-Follow up visit for Type 2 diabetes mellitus with stage 3b chronic kidney disease, with long-term current use of insulin ([place]), Hypertensive heart and renal disease with renal failure, stage 1 through stage 4 or unspecified chronic kidney disease, with heart failure ([place]), Pure hypercholesterolemia, Peripheral arterial disease ([place]), Class 1 obesity due to excess calories with serious comorbidity and body mass index (BMI) of 32.0 to 32.9 in adult, Tobacco use, Need for Tdap vaccination, Food insecurity, Secondary hypercoagulable state ([place]), Paroxysmal atrial fibrillation ([place]), Secondary hyperparathyroidism of renal origin ([place]), Polypharmacy.  Victoza 1.2 mg discontinued. TDAP given  Follow up in 3 months (Visit scheduled 10/29/2022).  06/12/2022- Telephone encounter- Ozempic 0.25 mg sq once a week started.   04/10/2022- Thomes Lolling, LPN- Annual Wellness Visit  03/25/2022- [name], MD (PCP)-Follow up visit for Encounter for general adult medical examination w/o abnormal findings, Type 2 diabetes mellitus with stage 3b chronic kidney disease, with long-term current use of insulin ([place]), Hypertensive heart and renal disease with renal failure, stage 1 through stage 4 or unspecified chronic kidney disease, with heart failure ([place]), Paroxysmal atrial fibrillation ([place]), Bilateral impacted cerumen, Peripheral arterial disease ([place]), Pure hypercholesterolemia, Class 1 obesity due to excess calories with serious comorbidity and body mass index (BMI) of 32.0 to 32.9 in adult, Immunization due.  No medication changes. Return  for 1 year physical, 4 month DM.    Recent consult visits:  03/28/2022- Will Meredith Leeds, MD (Cardiology)- Follow up visit for Typical atrial flutter [facility]), Secondary hypercoagulable state [facility]), Paroxysmal atrial fibrillation (HCC)  No medication changes.  Follow up in 3 months.  03/20/2022- Marylynn Pearson, MD (Ophthalmology)- Initial visit for DM Eye Exam Exudative Retinopathy left eye No medication changes Return 8-12 months to obtain macula ocular coherent tomography  02/26/2022- [name], MD (Cardiology)- Follow up visit for Essential hypertension, Chronic combined systolic and diastolic heart failure ([place]), Atrial tachycardia ([place]). Metoprolol changed from 75 mg daily to 50 mg daily to take with or immediately following a meal. Follow up in 4 months.  02/13/2022- CARDIOVERSION CANCELED  8/30/2023[name], MD (Cardiology)- Follow up visit for Paroxysmal atrial fibrillation [facility]), Essential hypertension, Pain and swelling of ankle, unspecified laterality. Patient scheduled for Cardioversion 02/06/2022  Medication instructions: Hold enalapril until after Cardioversion, Increase Metoprolol to 100 mg after Cardioversion. Hold Furosemide, Continue Eliquis. Follow up 02/26/2022   Hospital visits:  None in previous 6 months   Star Rating Drugs:  Ozempic 0.25/0.5 mg- Last filled 06/12/2022 28DS  [facility] Enalapril 5 mg- Last filled 07/28/2022 90DS and 04/29/2022 90DS  [facility]    Care Gaps: Annual wellness visit in last year? Yes- 03/25/2022 Jun 07, 2022 COVID-19 Vaccine (7 - 2023-24 season)- Last completed: Apr 12, 2022  Jun 29, 2022 Lung Cancer Screening (Yearly)-  Scheduled for: Aug 16, 2022  Colorectal cancer screening: Type of screening: Colonoscopy. Completed 09/14/2020. Repeat every 3 years   Mammogram status: Completed 03/21/2022. Repeat every year   Bone Density status: Completed 03/23/2015.   If Diabetic: Last eye exam / retinopathy screening:  03/20/2022- Dr. Marylynn Pearson Exudative Retinopathy left eye Last diabetic foot exam: 03/25/2022   Pattricia Boss, CMA Clinical  Pharmacist Assistant [phone]'

## 2022-07-30 NOTE — Progress Notes (Signed)
  Care Coordination   Note   07/30/2022 Name: MIKEIA LOFTIS MRN: [id number] DOB: Jun 08, 1945  Trinidad Curet Nissen is a 77 y.o. year old female who sees [name], MD for primary care. I reached out to [name] by phone today to offer care coordination services.  Referral   Ms. Sroufe was given information about Care Coordination services today including:   The Care Coordination services include support from the care team which includes your Nurse Coordinator, Clinical Social Worker, or Pharmacist.  The Care Coordination team is here to help remove barriers to the health concerns and goals most important to you. Care Coordination services are voluntary, and the patient may decline or stop services at any time by request to their care team member.   Care Coordination Consent Status: Patient agreed to services and verbal consent obtained.   Follow up plan:  Telephone appointment with care coordination team member scheduled for:  08/05/22 with SW and 08/14/22 with RNCM   Encounter Outcome:  Pt. Scheduled  [place]  Direct Dial: [phone]

## 2022-07-31 ENCOUNTER — Ambulatory Visit: Payer: Medicare Other

## 2022-07-31 NOTE — Progress Notes (Signed)
Care Management & Coordination Services Pharmacy Note  07/31/2022 Name:  [name] MRN:  [id number] DOB:  04-03-1946  Summary: Patient reports that her pharmacy is no longer meeting her needs.   Recommendations/Changes made from today's visit: Recommend patient be onboarded to the pharmacy   Follow up plan: Will have a follow up appointment in the next two weeks to discuss her medications further  Will review her BS readings and BP readings closely Patien to be onboard to [facility]    Subjective: [name] is an 77 y.o. year old female who is a primary patient of [name], MD.  The care coordination team was consulted for assistance with disease management and care coordination needs.  Patient reports that she has been going to Dr. Baird Cancer for over 24 years. She is very grateful and [facility]. Patient reports that she did not wake up on time for this visit. She is doing all her vitals in the morning because of a program she used to be in. She does it every morning. She reports that Dr. Baird Cancer even asks for her book to review the vitals that she is taking. Patient reports that she is old school and prefers to prick her finger. For the last year she not had to make any payments to [employer] because of the supplemental. [name] is very grateful for Dr. Baird Cancer for all that she has done for her. She reports that Dr. Baird Cancer helped her with finding a new cardiologist that is excellent Dr. [name]. She is so grateful. She reports that she has fallen before when she was having heart issues, and the last fall was over 6 months ago. She has been doing good other then that, sometimes she has to sit in a chair or use her walker to help with support. She reports that [place] Her son currently lives in [place] and it takes over 2 hours to get her home. Her youngest son is  22 and she loves him but he does not check on her. She has a tablet set up in her den where she writes down her BS and BP readings and all vitals. She is scheduled for an ablation in April and she will have her cousin spend the night with her in case something happens.   Engaged with patient by telephone for initial visit.  03/28/2022- Will Meredith Leeds, MD (Cardiology)- Follow up visit for Typical atrial flutter [facility]), Secondary hypercoagulable state ([place]), Paroxysmal atrial fibrillation (HCC)  No medication changes.  Follow up in 3 months.   03/20/2022- Shelby Kamla Skilton, MD (Ophthalmology)- Initial visit for DM Eye Exam Exudative Retinopathy left eye No medication changes Return 8-12 months to obtain macula ocular coherent tomography   02/26/2022- [name], MD (Cardiology)- Follow up visit for Essential hypertension, Chronic combined systolic and diastolic heart failure ([place]), Atrial tachycardia ([place]). Metoprolol changed from 75 mg daily to 50 mg daily to take with or immediately following a meal. Follow up in 4 months.   02/13/2022- CARDIOVERSION CANCELED   8/30/2023[name], MD (Cardiology)- Follow up visit for Paroxysmal atrial fibrillation [facility]), Essential hypertension, Pain and swelling of ankle, unspecified laterality. Patient scheduled for Cardioversion 02/06/2022  Medication instructions: Hold enalapril until after Cardioversion, Increase Metoprolol to 100 mg after Cardioversion. Hold Furosemide, Continue Eliquis. Follow up 02/26/2022   Objective:  Lab Results  Component Value Date  CREATININE 1.46 (H) 02/20/2022   BUN 27 02/20/2022   EGFR 37 (L) 02/20/2022   GFRNONAA 30 (L) 03/02/2020   GFRAA 35 (L) 03/02/2020   NA 139 02/20/2022   K 4.3 02/20/2022   CALCIUM 9.4 02/20/2022   CO2 22 02/20/2022   GLUCOSE 127 (H) 02/20/2022    Lab Results  Component Value Date/Time   HGBA1C 6.2 (H) 03/25/2022 03:26 PM   HGBA1C 6.3 (H) 11/20/2021 03:42 PM   MICROALBUR  30 03/14/2021 05:38 PM   MICROALBUR 30 03/02/2020 03:47 PM    Last diabetic Eye exam:  Lab Results  Component Value Date/Time   HMDIABEYEEXA Retinopathy (A) 03/20/2022 12:00 AM    Last diabetic Foot exam: No results found for: "HMDIABFOOTEX"   Lab Results  Component Value Date   CHOL 161 06/04/2022   HDL 38 (L) 06/04/2022   LDLCALC 95 06/04/2022   TRIG 159 (H) 06/04/2022   CHOLHDL 4.2 06/04/2022       Latest Ref Rng & Units 03/25/2022    3:26 PM 11/20/2021    3:42 PM 07/16/2021    2:49 PM  Hepatic Function  Total Protein 6.0 - 8.5 g/dL 7.3  7.3  7.3   Albumin 3.8 - 4.8 g/dL 4.6  4.5    AST 0 - 40 IU/L 19  17    ALT 0 - 32 IU/L 16  12    Alk Phosphatase 44 - 121 IU/L 99  92    Total Bilirubin 0.0 - 1.2 mg/dL 0.2  0.2    Bilirubin, Direct 0.00 - 0.40 mg/dL <0.10       Lab Results  Component Value Date/Time   TSH 1.280 12/16/2019 10:54 AM   TSH 1.050 12/01/2018 11:08 AM   FREET4 1.54 06/28/2009 02:50 AM       Latest Ref Rng & Units 02/20/2022   10:58 AM 02/08/2022    4:02 PM 01/30/2022    1:01 PM  CBC  WBC 3.4 - 10.8 x10E3/uL 8.7  8.6  9.3   Hemoglobin 11.1 - 15.9 g/dL 11.3  11.7  12.5   Hematocrit 34.0 - 46.6 % 35.9  38.1  40.1   Platelets 150 - 450 x10E3/uL 256  243  239     Lab Results  Component Value Date/Time   VD25OH 49.5 03/02/2020 04:03 PM   VITAMINB12 1,754 (H) 12/07/2020 11:44 AM    Clinical ASCVD: No  The 10-year ASCVD risk score (Arnett DK, et al., 2019) is: 36.1%   Values used to calculate the score:     Age: 77 years     Sex: Female     Is Non-Hispanic African American: Yes     Diabetic: Yes     Tobacco smoker: Yes     Systolic Blood Pressure: 123456 mmHg     Is BP treated: Yes     HDL Cholesterol: 38 mg/dL     Total Cholesterol: 161 mg/dL         07/29/2022    3:39 PM 04/10/2022    3:32 PM 11/20/2021    2:41 PM  Depression screen PHQ 2/9  Decreased Interest 0 0 0  Down, Depressed, Hopeless 0 0 0  PHQ - 2 Score 0 0 0     Social  History   Tobacco Use  Smoking Status Every Day   Packs/day: 0.50   Years: 40.00   Total pack years: 20.00   Types: Cigarettes  Smokeless Tobacco Never  Tobacco Comments   smoked 1.5ppd x  9, 1ppdx40, cut back to 1/2ppd past 5 years   BP Readings from Last 3 Encounters:  07/29/22 104/68  06/13/22 132/80  03/28/22 138/72   Pulse Readings from Last 3 Encounters:  07/29/22 (!) 112  03/28/22 60  03/25/22 70   Wt Readings from Last 3 Encounters:  07/29/22 212 lb 3.2 oz (96.3 kg)  06/13/22 207 lb (93.9 kg)  04/10/22 207 lb (93.9 kg)   BMI Readings from Last 3 Encounters:  07/29/22 32.26 kg/m  06/13/22 31.47 kg/m  04/10/22 31.47 kg/m    Allergies  Allergen Reactions   Codeine Hives and Nausea And Vomiting   Augmentin [Amoxicillin-Pot Clavulanate] Diarrhea   Coreg [Carvedilol] Other (See Comments)    No energy   Warfarin And Related Other (See Comments)    Severe rectal bleeding    Medications Reviewed Today     Reviewed by [name], MD (Physician) on 07/29/22 at [place] List Status: <None>   Medication Order Taking? Sig Documenting Provider Last Dose Status Informant  acetaminophen (TYLENOL) 500 MG tablet [id number] No Take 1,000 mg by mouth 3 (three) times daily as needed for pain. [provider] Taking Active Self  BD PEN NEEDLE NANO U/F 32G X 4 MM MISC [id number] No USE AS DIRECTED WITH INSULIN PENS [name], MD Taking Active Self  betamethasone dipropionate 0.05 % cream [id number] No Apply 1 application topically 2 (two) times daily as needed (dry skin). [name], MD Taking Active Self  betamethasone valerate ointment (VALISONE) 0.1 % [id number] No Apply 1 Application topically 2 (two) times daily. prn  Patient taking differently: Apply 1 Application topically daily as needed (Dry skin).   [name], MD Taking Active Self  Cholecalciferol (VITAMIN D) 2000 UNITS CAPS [id number] No Take 2,000 Units by mouth daily. [provider] Taking Active Self  diclofenac Sodium (VOLTAREN) 1 % GEL [id number] No Apply 4 g topically 4 (four) times daily.  Patient taking differently: Apply 4 g topically 4 (four) times daily as needed (pain).   Deno Etienne, DO Taking Active Self  digoxin (LANOXIN) 0.125 MG tablet [id number] No TAKE 1 TABLET BY MOUTH EVERY DAY [name], MD Taking Active   ELIQUIS 5 MG TABS tablet [id number] No TAKE 1 TABLET BY MOUTH TWICE A Lynnell Dike, MD Taking Active   enalapril (VASOTEC) 5 MG tablet [id number] No TAKE 1 TABLET BY MOUTH EVERYDAY AT BEDTIME [name], MD Taking Active   ezetimibe (ZETIA) 10 MG tablet [id number]  Take 1 tablet (10 mg total) by mouth daily. [name], MD  Active   furosemide (LASIX) 40 MG tablet [id number] No TAKE 1 TABLET DAILY AS NEEDED FOR EDEMA FOR FLUID OVERLOAD. PLEASE KEEP UPCOMING APPOINTMENT [name], MD Taking Active   KLOR-CON M20 20 MEQ tablet [id number] No TAKE 1 TABLET (20 MEQ TOTAL) BY MOUTH DAILY. WHEN TAKING FUROSEMIDE (LASIX).  Patient taking differently: Take 20 mEq by mouth daily as needed (Cramps). When taking double  furosemide (Lasix).   Jettie Booze, MD Taking Active Self  liraglutide (VICTOZA) 18 MG/3ML SOPN [id number] No INJECT 1.2 MG INTO THE SKIN AT BEDTIME. [name], MD Taking Active   LIVALO 2 MG TABS [id number] No TAKE 1 TABLET BY MOUTH EVERY DAY  Patient taking differently: Take 2 mg by mouth daily. Takes Monday- Sat. None on Sunday   [name], MD Taking Active Self  metoprolol succinate (TOPROL-XL) 50 MG 24 hr tablet [id number] No Take 1 tablet (50 mg total) by mouth  daily. Take with or immediately following a meal. [name], MD Taking Active   Multiple Vitamin [facility] WITH MINERALS) TABS [id number] No Take 1 tablet by mouth daily. Centrum Silver [provider] Taking Active Self  nystatin cream (MYCOSTATIN) [id number] No Apply 1 application topically 2 (two) times daily as needed for dry  skin (under breasts). [provider] Taking Active Self  Roma Schanz test strip [id number] No USE AS DIRECTED TO CHECK BLOOD SUGARS 1 TIME PER DAY [name], MD Taking Active   pantoprazole (PROTONIX) 20 MG tablet [id number] No TAKE 1 TABLET BY MOUTH DAILY AS NEEDED FOR HEARTBURN. [name], MD Taking Active Self  Semaglutide,0.25 or 0.'5MG'$ /DOS, 2 MG/3ML Bonney Aid [id number] No Inject 0.25 mg into the skin once a week. [name], MD Taking Active   tiZANidine (ZANAFLEX) 4 MG tablet [id number] No TAKE 1 TABLET (4 MG TOTAL) BY MOUTH DAILY AS NEEDED FOR BACK PAIN [name], MD Taking Active Self  traMADol (ULTRAM) 50 MG tablet [id number] No Take 1 tablet (50 mg total) by mouth every 6 (six) hours as needed. [name], MD Taking Active Self  vitamin C (ASCORBIC ACID) 500 MG tablet [id number] No Take 500 mg by mouth daily. [provider] Taking Active Self            SDOH:  (Social Determinants of Health) assessments and interventions performed: No SDOH Interventions    Flowsheet Row Clinical Support from 04/10/2022 in [facility] Chronic Care Management from 08/25/2020 in [facility] Clinical Support from 02/25/2019 in [facility]  SDOH Interventions     Food Insecurity Interventions Intervention Not Indicated Intervention Not Indicated --  Housing Interventions -- Other (Comment)  [Referred to [facility]] --  Transportation Interventions Intervention Not Indicated Intervention Not Indicated --  Depression Interventions/Treatment  -- -- [id number] Score <4 Follow-up Not Indicated  Financial Strain Interventions Intervention Not Indicated -- --  Physical Activity Interventions Patient Refused, Other (Comments) -- --  Stress Interventions Intervention Not Indicated -- --       Medication Assistance: None required.  Patient affirms current coverage meets  needs.  Medication Access: Within the past 30 days, how often has patient missed a dose of medication? No  Is a pillbox or other method used to improve adherence? Yes  Factors that may affect medication adherence? transportation problems and PHA Are meds synced by current pharmacy? No  Are meds delivered by current pharmacy? No  Does patient experience delays in picking up medications due to transportation concerns? No   [facility] Reviewed: Is patient disadvantaged to use [facility]?: No  Current Rx insurance plan: [employer]  [id number] [place] [address] [address]Phone: [phone]: [phone] [facility]Phone: [phone]: [phone] [facility] services reviewed with patient today?: Yes  Patient requests to transfer care to [facility]?: Yes  Reason patient declined to change pharmacies: Not applicable   Compliance/Adherence/Medication fill history: Care Gaps: Foot Exam COVID-19 Booster Lung Cancer screening  Star-Rating Drugs: Enalapril 5 mg tablet  Livalo 2 mg tablet Ozempic 0.25 mg    Assessment/Plan  Hypertension (BP goal <140/90) -Not ideally controlled -Current treatment: Metoprolol Succinate 50 mg tablet daily Appropriate, Effective, Safe, Accessible Enalapril  5 mg tablet once per day Appropriate, Effective, Safe, Accessible -Current home readings: this morning her HR: 60, BP: 106/60  -Current dietary habits: will discuss during next office visit  -Current exercise habits: will discuss during next office visit  -Denies hypotensive/hypertensive symptoms -Educated on Importance of home blood pressure monitoring; Proper BP monitoring technique; Recommend patient is transferred to [facility] to help with medication adherence and convenience.  Patient reports that right now the issue with her medication is the amount of time she takes on the phone [facility] not [facility] on time, or send her medications on time through delivery.  Patient reports that she is ready to be onboarded to the pharmacy  -Counseled to monitor BP at home daily, document, and provide log at future appointments -Recommended to continue current medication  Diabetes (A1c goal <7%) -Controlled -Current medications: Ozempic 0.25 mg once per week Appropriate, Effective, Safe, Accessible Patient reports that her Ozempic is $11.25, because she has additional supplemental insurance.  -Current home glucose readings fasting glucose: 98  -Denies hypoglycemic/hyperglycemic symptoms - She does not have any concerns about the cost of her medications  -Current exercise: will discuss during next visit  -Educated on Benefits of routine self-monitoring of blood sugar; -Patient reports that she prefers the finger prick method  -[facility] -She gets her alcohol swabs from the pharmacy   -Counseled to check feet daily and get yearly eye exams -Recommended to continue current medication   Orlando Penner, CPP, PharmD Clinical Pharmacist Practitioner [facility] [phone]

## 2022-08-01 LAB — CMP14+EGFR
ALT: 16 IU/L (ref 0–32)
AST: 18 IU/L (ref 0–40)
Albumin/Globulin Ratio: 1.5 (ref 1.2–2.2)
Albumin: 4.4 g/dL (ref 3.8–4.8)
Alkaline Phosphatase: 109 IU/L (ref 44–121)
BUN/Creatinine Ratio: 20 (ref 12–28)
BUN: 37 mg/dL — ABNORMAL HIGH (ref 8–27)
Bilirubin Total: 0.3 mg/dL (ref 0.0–1.2)
CO2: 20 mmol/L (ref 20–29)
Calcium: 9.6 mg/dL (ref 8.7–10.3)
Chloride: 97 mmol/L (ref 96–106)
Creatinine, Ser: 1.84 mg/dL — ABNORMAL HIGH (ref 0.57–1.00)
Globulin, Total: 2.9 g/dL (ref 1.5–4.5)
Glucose: 121 mg/dL — ABNORMAL HIGH (ref 70–99)
Potassium: 4 mmol/L (ref 3.5–5.2)
Sodium: 135 mmol/L (ref 134–144)
Total Protein: 7.3 g/dL (ref 6.0–8.5)
eGFR: 28 mL/min/{1.73_m2} — ABNORMAL LOW (ref >59)

## 2022-08-01 LAB — PTH, INTACT AND CALCIUM: PTH: 114 pg/mL — ABNORMAL HIGH (ref 15–65)

## 2022-08-01 LAB — DIGOXIN LEVEL: Digoxin, Serum: 0.6 ng/mL (ref 0.5–0.9)

## 2022-08-01 LAB — CBC
Hematocrit: 40.2 % (ref 34.0–46.6)
Hemoglobin: 13 g/dL (ref 11.1–15.9)
MCH: 26.3 pg — ABNORMAL LOW (ref 26.6–33.0)
MCHC: 32.3 g/dL (ref 31.5–35.7)
MCV: 81 fL (ref 79–97)
Platelets: 260 10*3/uL (ref 150–450)
RBC: 4.95 x10E6/uL (ref 3.77–5.28)
RDW: 14 % (ref 11.7–15.4)
WBC: 10 10*3/uL (ref 3.4–10.8)

## 2022-08-01 LAB — PROTEIN ELECTROPHORESIS, SERUM
A/G Ratio: 1.1 (ref 0.7–1.7)
Albumin ELP: 3.8 g/dL (ref 2.9–4.4)
Alpha 1: 0.2 g/dL (ref 0.0–0.4)
Alpha 2: 1 g/dL (ref 0.4–1.0)
Beta: 1.1 g/dL (ref 0.7–1.3)
Gamma Globulin: 1.2 g/dL (ref 0.4–1.8)
Globulin, Total: 3.5 g/dL (ref 2.2–3.9)

## 2022-08-01 LAB — HEMOGLOBIN A1C
Est. average glucose Bld gHb Est-mCnc: 134 mg/dL
Hgb A1c MFr Bld: 6.3 % — ABNORMAL HIGH (ref 4.8–5.6)

## 2022-08-01 LAB — TSH: TSH: 1.37 u[IU]/mL (ref 0.450–4.500)

## 2022-08-01 LAB — PHOSPHORUS: Phosphorus: 4 mg/dL (ref 3.0–4.3)

## 2022-08-05 ENCOUNTER — Ambulatory Visit: Payer: Self-pay

## 2022-08-05 NOTE — Patient Instructions (Signed)
Visit Information  Thank you for taking time to visit with me today. Please don't hesitate to contact me if I can be of assistance to you.   Following are the goals we discussed today:   Goals Addressed             This Visit's Progress    COMPLETED: Care Coordination Activities       Care Coordination Interventions: [facility] referral received indicating patient is in need of resources to assist with food insecurity and transportation to a cardiac ablation in April Determined the patient received $23 per month [employer] benefits which does not cover much at the grocery store. Patient reports she is not missing meals but is unable to purchase nutritious foods needed for a diabetic diet Discussed opportunity for patient to access a food pantry to assist with food insecurity - patient agreeable to this option indicating she does have her own form of transportation and can get to local food pantry locations Mailed the patient a list of food pantries located in [place] Performed chart review to note patient has an upcomming cardiac ablation which she will need transportation to. Patient reports she is aware of her health plan benefit but is unable to use it because the cardiologist stated they will not release the patient to a driver without a planned caregiver since this patient will be recovering from anesthesia Discussed options to obtain a caregiver involved asking a family member or friend or hiring a caregiver. Patient reports she does not like to ask for help and is unable to pay for a caregiver Patient reports her son lives in [place] and plans to visit in a few months so she may reschedule her procedure to a time he is available for assistance Advised the patient SW will collaborate with primary care provider Dr Baird Cancer advising of patients plan to reschedule for when her son can assist Collaboration with RN Care Manager to advise of interventions and plan for SW to sign off Reminded patient of future  call scheduled with RN Care Manager on 3/13         Your next appointment is by telephone on 3/13 at 11:30 with [place].  Please call the care guide team at [phone] if you need to cancel or reschedule your appointment.   If you are experiencing a Mental Health or [place] or need someone to talk to, please call 911  Patient verbalizes understanding of instructions and care plan provided today and agrees to view in [place]. Active MyChart status and patient understanding of how to access instructions and care plan via MyChart confirmed with patient.     Daneen Schick, BSW, CDP Social Worker, Certified Dementia Practitioner [facility]  Care Coordination [phone]

## 2022-08-05 NOTE — Patient Outreach (Signed)
  Care Coordination   Initial Visit Note   08/05/2022 Name: [name] MRN: [id number] DOB: [birth year]  [name] is a 77 y.o. year old female who sees [name], MD for primary care. I spoke with  [name] by phone today.  What matters to the patients health and wellness today?  I need transportation to my cardiac ablation    Goals Addressed             This Visit's Progress    COMPLETED: Care Coordination Activities       Care Coordination Interventions: [facility] referral received indicating patient is in need of resources to assist with food insecurity and transportation to a cardiac ablation in April Determined the patient received $23 per month [employer] benefits which does not cover much at the grocery store. Patient reports she is not missing meals but is unable to purchase nutritious foods needed for a diabetic diet Discussed opportunity for patient to access a food pantry to assist with food insecurity - patient agreeable to this option indicating she does have her own form of transportation and can get to local food pantry locations Mailed the patient a list of food pantries located in [place] Performed chart review to note patient has an upcomming cardiac ablation which she will need transportation to. Patient reports she is aware of her health plan benefit but is unable to use it because the cardiologist stated they will not release the patient to a driver without a planned caregiver since this patient will be recovering from anesthesia Discussed options to obtain a caregiver involved asking a family member or friend or hiring a caregiver. Patient reports she does not like to ask for help and is unable to pay for a caregiver Patient reports her son lives in [place] and plans to visit in a few months so she may reschedule her procedure to a time he is available for assistance Advised the patient SW will collaborate with primary care provider Dr Baird Cancer advising of  patients plan to reschedule for when her son can assist Collaboration with RN Care Manager to advise of interventions and plan for SW to sign off Reminded patient of future call scheduled with RN Care Manager on 3/13         SDOH assessments and interventions completed:  Yes  SDOH Interventions Today    Flowsheet Row Most Recent Value  SDOH Interventions   Food Insecurity Interventions Other (Comment)  [sent pt list of food pantries to help offset grocery costs]  Housing Interventions Intervention Not Indicated  Transportation Interventions Intervention Not Indicated  Utilities Interventions Intervention Not Indicated        Care Coordination Interventions:  Yes, provided   Interventions Today    Flowsheet Row Most Recent Value  Chronic Disease   Chronic disease during today's visit Diabetes, Hypertension (HTN), Congestive Heart Failure (CHF)  General Interventions   General Interventions Discussed/Reviewed General Interventions Discussed, [employer], Communication with  Communication with PCP/Specialists, RN        Follow up plan: No further intervention required. Patient will be contacted on 3/13 by RN Care Manager.    Encounter Outcome:  Pt. Visit Completed   Daneen Schick, BSW, CDP Social Worker, Certified Dementia Practitioner [facility]  Care Coordination [phone]

## 2022-08-12 DIAGNOSIS — N1832 Chronic kidney disease, stage 3b: Secondary | ICD-10-CM | POA: Diagnosis not present

## 2022-08-12 DIAGNOSIS — N2581 Secondary hyperparathyroidism of renal origin: Secondary | ICD-10-CM | POA: Diagnosis not present

## 2022-08-12 DIAGNOSIS — I129 Hypertensive chronic kidney disease with stage 1 through stage 4 chronic kidney disease, or unspecified chronic kidney disease: Secondary | ICD-10-CM | POA: Diagnosis not present

## 2022-08-12 DIAGNOSIS — E785 Hyperlipidemia, unspecified: Secondary | ICD-10-CM | POA: Diagnosis not present

## 2022-08-12 DIAGNOSIS — I5020 Unspecified systolic (congestive) heart failure: Secondary | ICD-10-CM | POA: Diagnosis not present

## 2022-08-12 DIAGNOSIS — I480 Paroxysmal atrial fibrillation: Secondary | ICD-10-CM | POA: Diagnosis not present

## 2022-08-12 DIAGNOSIS — E1122 Type 2 diabetes mellitus with diabetic chronic kidney disease: Secondary | ICD-10-CM | POA: Diagnosis not present

## 2022-08-14 ENCOUNTER — Ambulatory Visit: Payer: Self-pay

## 2022-08-14 NOTE — Patient Outreach (Addendum)
  Care Coordination   Initial Visit Note   08/14/2022 Name: [name] MRN: [id number] DOB: [birth year]  [name] is a 77 y.o. year old female who sees Shelby Chard, MD for primary care. I spoke with  [name] by phone today.  What matters to the patients health and wellness today?  Patient will work on increasing her daily water intake as directed.     Goals Addressed             This Visit's Progress    To improve kidney function       Care Coordination Interventions: Assessed the Patient understanding of chronic kidney disease    Evaluation of current treatment plan related to chronic kidney disease self management and patient's adherence to plan as established by provider      Reviewed prescribed diet increase water to 48-64 oz daily unless otherwise directed  Discussed complications of poorly controlled blood pressure such as heart disease, stroke, circulatory complications, vision complications, kidney impairment, sexual dysfunction    Provided education on kidney disease progression    Engage patient in early, proactive and ongoing discussion about goals of care and what matters most to them    Mailed printed educational materials related to Eating Right with Chronic Kidney disease        To keep A1c <7.0%       Care Coordination Interventions: Provided education to patient about basic DM disease process Reviewed medications with patient and discussed importance of medication adherence Provided patient with written educational materials related to hypo and hyperglycemia and importance of correct treatment Advised patient, providing education and rationale, to check cbg daily before meals and at bedtime and record, calling PCP for findings outside established parameters Review of patient status, including review of consultants reports, relevant laboratory and other test results, and medications completed Counseled on Diabetic diet, my plate method Mailed  printed educational materials related to Diabetes management        Interventions Today    Flowsheet Row Most Recent Value  Chronic Disease   Chronic disease during today's visit Diabetes, Chronic Kidney Disease/End Stage Renal Disease (ESRD)  General Interventions   General Interventions Discussed/Reviewed General Interventions Discussed, General Interventions Reviewed, Labs, Doctor Visits  Labs Kidney Function  Doctor Visits Discussed/Reviewed Doctor Visits Discussed, Doctor Visits Reviewed, PCP, Specialist  Education Interventions   Education Provided Provided Education  Provided Verbal Education On Nutrition  Nutrition Interventions   Nutrition Discussed/Reviewed Nutrition Discussed, Nutrition Reviewed, Fluid intake          SDOH assessments and interventions completed:  Yes  SDOH Interventions Today    Flowsheet Row Most Recent Value  SDOH Interventions   Transportation Interventions Intervention Not Indicated        Care Coordination Interventions:  Yes, provided   Follow up plan: Follow up call scheduled for 10/31/22 @12 :30 PM    Encounter Outcome:  Pt. Visit Completed

## 2022-08-14 NOTE — Patient Instructions (Signed)
Visit Information  Thank you for taking time to visit with me today. Please don't hesitate to contact me if I can be of assistance to you.   Following are the goals we discussed today:   Goals Addressed             This Visit's Progress    To improve kidney function       Care Coordination Interventions: Assessed the Patient understanding of chronic kidney disease    Evaluation of current treatment plan related to chronic kidney disease self management and patient's adherence to plan as established by provider      Reviewed prescribed diet increase water to 48-64 oz daily unless otherwise directed  Discussed complications of poorly controlled blood pressure such as heart disease, stroke, circulatory complications, vision complications, kidney impairment, sexual dysfunction    Provided education on kidney disease progression    Engage patient in early, proactive and ongoing discussion about goals of care and what matters most to them    Mailed printed educational materials related to Eating Right with Chronic Kidney disease        To keep A1c <7.0%       Care Coordination Interventions: Provided education to patient about basic DM disease process Reviewed medications with patient and discussed importance of medication adherence Provided patient with written educational materials related to hypo and hyperglycemia and importance of correct treatment Advised patient, providing education and rationale, to check cbg daily before meals and at bedtime and record, calling PCP for findings outside established parameters Review of patient status, including review of consultants reports, relevant laboratory and other test results, and medications completed Counseled on Diabetic diet, my plate method Mailed printed educational materials related to Diabetes management            Our next appointment is by telephone on 10/31/22 at 12:30 PM  Please call the care guide team at [phone]  if you need to cancel or reschedule your appointment.   If you are experiencing a Mental Health or [place] or need someone to talk to, please call 1-800-273-TALK (toll free, 24 hour hotline) go to [facility] [address] [place] [phone])  Patient verbalizes understanding of instructions and care plan provided today and agrees to view in [place]. Active MyChart status and patient understanding of how to access instructions and care plan via MyChart confirmed with patient.     Barb Merino, RN, BSN, CCM Care Management Coordinator [facility] Care Management Direct Phone: [phone]

## 2022-08-15 ENCOUNTER — Telehealth: Payer: Self-pay | Admitting: *Deleted

## 2022-08-15 NOTE — Telephone Encounter (Signed)
Called pt to go over some instructions for ablation next month. Pt reports that she has "some serious stuff going on with my son" and she cannot proceed with ablation next month. She would like to r/s and aware it would be later this summer before able to reschedule.  Pt is fine with  this. Aware scheduler will contact her at later date to reschedule this. Pt agreeable to plan.

## 2022-08-16 ENCOUNTER — Inpatient Hospital Stay: Admission: RE | Admit: 2022-08-16 | Payer: Medicare Other | Source: Ambulatory Visit

## 2022-08-19 ENCOUNTER — Telehealth: Payer: Self-pay | Admitting: Cardiovascular Disease

## 2022-08-19 ENCOUNTER — Telehealth: Payer: Self-pay

## 2022-08-19 DIAGNOSIS — I480 Paroxysmal atrial fibrillation: Secondary | ICD-10-CM

## 2022-08-19 DIAGNOSIS — I4719 Other supraventricular tachycardia: Secondary | ICD-10-CM

## 2022-08-19 MED ORDER — ENALAPRIL MALEATE 5 MG PO TABS: 5.0000 mg | ORAL_TABLET | Freq: Every day | ORAL | 1 refills | Status: DC

## 2022-08-19 MED ORDER — DIGOXIN 125 MCG PO TABS: 125.0000 ug | ORAL_TABLET | Freq: Every day | ORAL | 1 refills | Status: DC

## 2022-08-19 NOTE — Telephone Encounter (Signed)
Rx request sent to pharmacy.  

## 2022-08-19 NOTE — Progress Notes (Addendum)
Care Management & Coordination Services Pharmacy Team  Reason for Encounter: Appointment Reminder  Contacted patient to confirm telephone appointment with Orlando Penner, PharmD on 08-20-2022 at 11:00. Spoke with patient on 08/19/2022   Do you have any problems getting your medications? Yes If yes what types of problems are you experiencing? Access barriers. [facility] to [facility] was completed. Prescriptions requested from PCP and spoke with Cloe at Dr. Blenda Mounts office for enalapril and digoxin prescriptions.  What is your top health concern you would like to discuss at your upcoming visit?   Have you seen any other providers since your last visit with PCP? No   Chart review: Recent office visits:  08-14-2022 Lynne Logan, RN ([place])  08-05-2022 Daneen Schick ([place])  Recent consult visits:  None  Hospital visits:  None in previous 6 months   Star Rating Drugs:  Ozempic 0.25/0.5 mg- Last filled 08-15-2022 28 [facility] Previous 06-12-2022 28 DS Enalapril 5 mg- Last filled 07-28-2022 [facility]. Previous 04-29-2022 90 DS.  Care Gaps: Annual wellness visit in last year? Yes  If Diabetic: Last eye exam / retinopathy screening:03/20/2022 Last diabetic foot exam:03/25/2022    [place] Clinical Pharmacist Assistant [phone]

## 2022-08-19 NOTE — Telephone Encounter (Signed)
*  STAT* If patient is at the pharmacy, call can be transferred to refill team.   1. Which medications need to be refilled? (please list name of each medication and dose if known)   enalapril (VASOTEC) 5 MG tablet    digoxin (LANOXIN) 0.125 MG tablet    2. Which pharmacy/location (including street and city if local pharmacy) is medication to be sent to? [facility] - [place], [place] - [address] Phone: [phone]  Fax: [phone]       3. Do they need a 30 day or 90 day supply? [place]

## 2022-08-20 ENCOUNTER — Other Ambulatory Visit: Payer: Self-pay

## 2022-08-20 ENCOUNTER — Ambulatory Visit: Payer: Medicare Other

## 2022-08-20 MED ORDER — METOPROLOL SUCCINATE ER 50 MG PO TB24: 50.0000 mg | ORAL_TABLET | Freq: Every day | ORAL | 3 refills | Status: DC

## 2022-08-20 MED ORDER — FUROSEMIDE 40 MG PO TABS: ORAL_TABLET | ORAL | 0 refills | Status: DC

## 2022-08-20 MED ORDER — PANTOPRAZOLE SODIUM 20 MG PO TBEC: DELAYED_RELEASE_TABLET | ORAL | 1 refills | Status: DC

## 2022-08-20 MED ORDER — APIXABAN 5 MG PO TABS: 5.0000 mg | ORAL_TABLET | Freq: Two times a day (BID) | ORAL | 1 refills | Status: DC

## 2022-08-20 MED ORDER — SEMAGLUTIDE(0.25 OR 0.5MG/DOS) 2 MG/3ML ~~LOC~~ SOPN: 0.2500 mg | PEN_INJECTOR | SUBCUTANEOUS | 1 refills | Status: DC

## 2022-08-20 MED ORDER — ONETOUCH VERIO VI STRP: ORAL_STRIP | 7 refills | Status: DC

## 2022-08-20 MED ORDER — NYSTATIN 100000 UNIT/GM EX CREA: 1.0000 | TOPICAL_CREAM | Freq: Two times a day (BID) | CUTANEOUS | 1 refills | Status: DC | PRN

## 2022-08-20 MED ORDER — PITAVASTATIN CALCIUM 2 MG PO TABS: 2.0000 mg | ORAL_TABLET | Freq: Every day | ORAL | 1 refills | Status: DC

## 2022-08-20 MED ORDER — EZETIMIBE 10 MG PO TABS: 10.0000 mg | ORAL_TABLET | Freq: Every day | ORAL | 1 refills | Status: DC

## 2022-08-20 NOTE — Progress Notes (Signed)
Care Management & Coordination Services Pharmacy Note  08/20/2022 Name:  [name] MRN:  [id number] DOB:  1946/05/21  Summary: Patient reports that she had to pick up three prescriptions but she had to get in touch with another doctor. She was told that Ozempic was also going to be delivered.   Recommendations/Changes made from today's visit: Recommend that you continue current medication regimen  Patient to read letter, received by insurance in regards to Digoxin  Follow up plan: Patient to have ablation therapy completed    Subjective: [name] is an 77 y.o. year old female who is a primary patient of [name], MD.  The care coordination team was consulted for assistance with disease management and care coordination needs.    Engaged with patient by telephone for follow up visit.  Recent office visits:  08-14-2022 Lynne Logan, RN (CC)   08-05-2022 Daneen Schick (CC)   Recent consult visits:  None   Hospital visits:  None in previous 6 months   Objective:  Lab Results  Component Value Date   CREATININE 1.84 (H) 07/29/2022   BUN 37 (H) 07/29/2022   EGFR 28 (L) 07/29/2022   GFRNONAA 30 (L) 03/02/2020   GFRAA 35 (L) 03/02/2020   NA 135 07/29/2022   K 4.0 07/29/2022   CALCIUM 9.6 07/29/2022   CO2 20 07/29/2022   GLUCOSE 121 (H) 07/29/2022    Lab Results  Component Value Date/Time   HGBA1C 6.3 (H) 07/29/2022 05:00 PM   HGBA1C 6.2 (H) 03/25/2022 03:26 PM   MICROALBUR 30 03/14/2021 05:38 PM   MICROALBUR 30 03/02/2020 03:47 PM    Last diabetic Eye exam:  Lab Results  Component Value Date/Time   HMDIABEYEEXA Retinopathy (A) 03/20/2022 12:00 AM    Last diabetic Foot exam: No results found for: "HMDIABFOOTEX"   Lab Results  Component Value Date   CHOL 161 06/04/2022   HDL 38 (L) 06/04/2022   LDLCALC 95 06/04/2022   TRIG 159 (H) 06/04/2022   CHOLHDL 4.2 06/04/2022       Latest Ref Rng & Units 07/29/2022    5:00 PM 03/25/2022     3:26 PM 11/20/2021    3:42 PM  Hepatic Function  Total Protein 6.0 - 8.5 g/dL 7.3  7.3  7.3   Albumin 3.8 - 4.8 g/dL 4.4  4.6  4.5   AST 0 - 40 IU/L 18  19  17    ALT 0 - 32 IU/L 16  16  12    Alk Phosphatase 44 - 121 IU/L 109  99  92   Total Bilirubin 0.0 - 1.2 mg/dL 0.3  0.2  0.2   Bilirubin, Direct 0.00 - 0.40 mg/dL  <0.10      Lab Results  Component Value Date/Time   TSH 1.370 07/29/2022 05:00 PM   TSH 1.280 12/16/2019 10:54 AM   FREET4 1.54 06/28/2009 02:50 AM       Latest Ref Rng & Units 07/29/2022    5:00 PM 02/20/2022   10:58 AM 02/08/2022    4:02 PM  CBC  WBC 3.4 - 10.8 x10E3/uL 10.0  8.7  8.6   Hemoglobin 11.1 - 15.9 g/dL 13.0  11.3  11.7   Hematocrit 34.0 - 46.6 % 40.2  35.9  38.1   Platelets 150 - 450 x10E3/uL 260  256  243     Lab Results  Component Value Date/Time   VD25OH 49.5 03/02/2020 04:03 PM   VITAMINB12 1,754 (H) 12/07/2020 11:44 AM  Clinical ASCVD: No  The 10-year ASCVD risk score (Arnett DK, et al., 2019) is: 36.1%   Values used to calculate the score:     Age: 77 years     Sex: Female     Is Non-Hispanic African American: Yes     Diabetic: Yes     Tobacco smoker: Yes     Systolic Blood Pressure: 123456 mmHg     Is BP treated: Yes     HDL Cholesterol: 38 mg/dL     Total Cholesterol: 161 mg/dL        07/29/2022    3:39 PM 04/10/2022    3:32 PM 11/20/2021    2:41 PM  Depression screen PHQ 2/9  Decreased Interest 0 0 0  Down, Depressed, Hopeless 0 0 0  PHQ - 2 Score 0 0 0     Social History   Tobacco Use  Smoking Status Every Day   Packs/day: 0.50   Years: 40.00   Additional pack years: 0.00   Total pack years: 20.00   Types: Cigarettes  Smokeless Tobacco Never  Tobacco Comments   smoked 1.5ppd x 9, 1ppdx40, cut back to 1/2ppd past 5 years   BP Readings from Last 3 Encounters:  07/29/22 104/68  06/13/22 132/80  03/28/22 138/72   Pulse Readings from Last 3 Encounters:  07/29/22 (!) 112  03/28/22 60  03/25/22 70   Wt Readings  from Last 3 Encounters:  07/29/22 212 lb 3.2 oz (96.3 kg)  06/13/22 207 lb (93.9 kg)  04/10/22 207 lb (93.9 kg)   BMI Readings from Last 3 Encounters:  07/29/22 32.26 kg/m  06/13/22 31.47 kg/m  04/10/22 31.47 kg/m    Allergies  Allergen Reactions   Codeine Hives and Nausea And Vomiting   Augmentin [Amoxicillin-Pot Clavulanate] Diarrhea   Coreg [Carvedilol] Other (See Comments)    No energy   Warfarin And Related Other (See Comments)    Severe rectal bleeding    Medications Reviewed Today     Reviewed by Lynne Logan, RN (Registered Nurse) on 08/14/22 at 78  Med List Status: <None>   Medication Order Taking? Sig Documenting Provider Last Dose Status Informant  acetaminophen (TYLENOL) 500 MG tablet [id number] No Take 1,000 mg by mouth 3 (three) times daily as needed for pain. [provider] Taking Active Self  BD PEN NEEDLE NANO U/F 32G X 4 MM MISC [id number] No USE AS DIRECTED WITH INSULIN PENS [name], MD Taking Active Self  betamethasone valerate ointment (VALISONE) 0.1 % [id number] No Apply 1 Application topically 2 (two) times daily. prn  Patient taking differently: Apply 1 Application topically daily as needed (Dry skin).   [name], MD Taking Active Self  Cholecalciferol (VITAMIN D) 2000 UNITS CAPS [id number] No Take 2,000 Units by mouth daily. [provider] Taking Active Self  diclofenac Sodium (VOLTAREN) 1 % GEL [id number] No Apply 4 g topically 4 (four) times daily.  Patient taking differently: Apply 4 g topically 4 (four) times daily as needed (pain).   Deno Etienne, DO Taking Active Self  digoxin (LANOXIN) 0.125 MG tablet [id number] No TAKE 1 TABLET BY MOUTH EVERY DAY [name], MD Taking Active   ELIQUIS 5 MG TABS tablet [id number] No TAKE 1 TABLET BY MOUTH TWICE A Lynnell Dike, MD Taking Active   enalapril (VASOTEC) 5 MG tablet [id number] No TAKE 1 TABLET BY MOUTH EVERYDAY AT BEDTIME [name], MD Taking Active    ezetimibe (ZETIA) 10 MG tablet [id number]  Take  1 tablet (10 mg total) by mouth daily. [name], MD  Active   furosemide (LASIX) 40 MG tablet [id number] No TAKE 1 TABLET DAILY AS NEEDED FOR EDEMA FOR FLUID OVERLOAD. PLEASE KEEP UPCOMING APPOINTMENT [name], MD Taking Active   KLOR-CON M20 20 MEQ tablet [id number] No TAKE 1 TABLET (20 MEQ TOTAL) BY MOUTH DAILY. WHEN TAKING FUROSEMIDE (LASIX).  Patient taking differently: Take 20 mEq by mouth daily as needed (Cramps). When taking double  furosemide (Lasix).   Jettie Booze, MD Taking Active Self  LIVALO 2 MG TABS [id number] No TAKE 1 TABLET BY MOUTH EVERY DAY  Patient taking differently: Take 2 mg by mouth daily. Takes Monday- Sat. None on Sunday   [name], MD Taking Active Self  metoprolol succinate (TOPROL-XL) 50 MG 24 hr tablet [id number] No Take 1 tablet (50 mg total) by mouth daily. Take with or immediately following a meal. [name], MD Taking Active   Multiple Vitamin [facility] WITH MINERALS) TABS [id number] No Take 1 tablet by mouth daily. Centrum Silver [provider] Taking Active Self  nystatin cream (MYCOSTATIN) [id number] No Apply 1 application topically 2 (two) times daily as needed for dry skin (under breasts). [provider] Taking Active Self  Roma Schanz test strip [id number] No USE AS DIRECTED TO CHECK BLOOD SUGARS 1 TIME PER DAY [name], MD Taking Active   pantoprazole (PROTONIX) 20 MG tablet [id number] No TAKE 1 TABLET BY MOUTH DAILY AS NEEDED FOR HEARTBURN. [name], MD Taking Active Self  Semaglutide,0.25 or 0.5MG /DOS, 2 MG/3ML Bonney Aid [id number] No Inject 0.25 mg into the skin once a week. [name], MD Taking Active   tiZANidine (ZANAFLEX) 4 MG tablet [id number] No TAKE 1 TABLET (4 MG TOTAL) BY MOUTH DAILY AS NEEDED FOR BACK PAIN [name], MD Taking Active Self  traMADol (ULTRAM) 50 MG tablet [id number] No Take 1 tablet (50 mg total) by mouth every 6 (six)  hours as needed. [name], MD Taking Active Self  vitamin C (ASCORBIC ACID) 500 MG tablet [id number] No Take 500 mg by mouth daily. [provider] Taking Active Self            SDOH:  (Social Determinants of Health) assessments and interventions performed: No SDOH Interventions    [place] Coordination from 08/14/2022 in [place] Coordination from 08/05/2022 in [place] from 04/10/2022 in [facility] Chronic Care Management from 08/25/2020 in [facility] Clinical Support from 02/25/2019 in [facility]  SDOH Interventions       Food Insecurity Interventions -- Other (Comment)  [sent pt list of food pantries to help offset grocery costs] Intervention Not Indicated Intervention Not Indicated --  Housing Interventions -- Intervention Not Indicated -- Other (Comment)  [Referred to [facility]] --  Transportation Interventions Intervention Not Indicated Intervention Not Indicated Intervention Not Indicated Intervention Not Indicated --  Utilities Interventions -- Intervention Not Indicated -- -- --  Depression Interventions/Treatment  -- -- -- -- PHQ2-9 Score <4 Follow-up Not Indicated  Financial Strain Interventions -- -- Intervention Not Indicated -- --  Physical Activity Interventions -- -- Patient Refused, Other (Comments) -- --  Stress Interventions -- -- Intervention Not Indicated -- --       Medication Assistance: None required.  Patient affirms current coverage meets needs.  Medication Access: Within the past 30 days, how often has patient  missed a dose of medication? No Is a pillbox or other method used to improve adherence?  She is using a 7 day pill pack, one for morning and one for night time, she does this every week for all of her medications  Factors  that may affect medication adherence? transportation problems Are meds synced by current pharmacy? No  Are meds delivered by current pharmacy? No  Does patient experience delays in picking up medications due to transportation concerns? No   [facility] Reviewed: Is patient disadvantaged to use [facility]?: No  Current Rx insurance plan: [facility] Medicare Name and location of [facility] pharmacy[facility]  [id number] [place], [address] [place] [address] Phone: [phone] Fax: [phone]  [facility] ([place], [place] [address] [place] [address] Phone: [phone] Fax: [phone]  [facility] - [place], [place] - [place] [address] [place] [address] Phone: [phone] Fax: [phone]  [facility] services reviewed with patient today?: Yes  Patient requests to transfer care to [facility]?: Yes   Compliance/Adherence/Medication fill history: Recent office visits:     Recent consult visits:  08-14-2022 LittleClaudette Stapler, RN ([place])   08-05-2022 Daneen Schick ([place])   Recent consult visits:  None   Hospital visits:  None in previous 6 months     Star Rating Drugs:  Ozempic 0.25/0.5 mg- Last [facility]15[facility]2022 [facility] Previous 06-12-2022 28 DS Enalapril 5 mg- Last filled [facility]28-2022 [facility]. Previous 04-29-2022 90 DS.   Care Gaps: Annual wellness visit in last year? Yes   If Diabetic: Last eye exam / retinopathy screening:03/20/2022 Last diabetic foot exam:03/25/2022      Hospital visits:  None in previous 6 months     Star Rating Drugs:  Ozempic 0.25/0.5 mg- Last [facility]15[facility]2022 [facility] Previous 06-12-2022 28 DS Enalapril 5 mg- Last [facility]22 [facility] Previous 04-29-2022 90 DS.   Care Gaps: Annual wellness visit in last year? Yes   If Diabetic: Last  eye exam / retinopathy screening:03/20/2022 Last diabetic foot exam:03/25/2022      Assessment/Plan   Hypertension (BP goal <130/80) -Controlled -Current treatment: Metoprolol Succinate 50 mg tablet once per day Appropriate, Effective, Safe, Accessible Per patient taking 50 +1/2 pill daily.  Enalapril 5 mg tablet once per day Appropriate, Effective, Safe, Accessible -Current home readings: she reports that her BP is excellent, this morning her BP was 117/70  -She reports that she is concerned because her HR is running high   -She went to Dr. [name] and was told that she was going to an ablation done to help with her heart rate.    -It was scheduled for the 25th of April but she was called on Monday, and they need to reschedule it to June 28th  -Current dietary habits: she is not doing salt anymore, she is using more pepper then salt  -Current exercise habits: she is walking back and forth  -Denies hypotensive/hypertensive symptoms -Educated on  how well she is doing checking her BP  -Counseled to monitor BP at home at least three times per week, document, and provide log at future appointments -Recommended to continue current medication Collaborated with patient to help her with her medication regimen     Orlando Penner, CPP, PharmD Clinical Pharmacist Practitioner [facility] [phone]

## 2022-08-27 ENCOUNTER — Ambulatory Visit: Payer: Self-pay | Admitting: Licensed Clinical Social Worker

## 2022-08-28 NOTE — Patient Outreach (Signed)
  Care Coordination   Initial Visit Note   08/27/22 Name: [name] MRN: [id number] DOB: 22-Dec-1945  [name] is a 77 y.o. year old female who sees Shelby Chard, MD for primary care. I spoke with  [name] by phone today.  What matters to the patients health and wellness today?  Financial Strain    Goals Addressed             This Visit's Progress    Obtain Supportive Resources-CT Assistance   On track    Activities and task to complete in order to accomplish goals.   Keep all upcoming appointments discussed today Continue with compliance of taking medication prescribed by Doctor Implement healthy coping skills discussed to assist with management of symptoms Continue working with [facility] care team to assist with goals identified Continue to decrease in tobacco use         SDOH assessments and interventions completed:  No     Care Coordination Interventions:  Yes, provided  Interventions Today    Flowsheet Row Most Recent Value  Chronic Disease   Chronic disease during today's visit Atrial Fibrillation (AFib), Hypertension (HTN), Diabetes, Chronic Kidney Disease/End Stage Renal Disease (ESRD), Other  [Depression]  General Interventions   General Interventions Discussed/Reviewed General Interventions Discussed, Community Resources  [LCSW introduced self and explained role in Care Coordination. Reviewed upcoming appts and discussed resources that may assist with financial strain]  Doctor Visits Discussed/Reviewed Doctor Visits Discussed  [name] Discussed/Reviewed Mental Health Discussed, Coping Strategies, Depression       Follow up plan: Follow up call scheduled for 1-2 weeks    Encounter Outcome:  Pt. Visit Completed   [name], MSW, [email or web address] Phone [phone] 11:56 AM

## 2022-08-28 NOTE — Patient Instructions (Signed)
Visit Information  Thank you for taking time to visit with me today. Please don't hesitate to contact me if I can be of assistance to you.   Following are the goals we discussed today:   Goals Addressed             This Visit's Progress    Obtain Supportive Resources-CT Assistance   On track    Activities and task to complete in order to accomplish goals.   Keep all upcoming appointments discussed today Continue with compliance of taking medication prescribed by Doctor Implement healthy coping skills discussed to assist with management of symptoms Continue working with [facility] care team to assist with goals identified Continue to decrease in tobacco use         Our next appointment is by telephone on 4/2 at 1:30 PM  Please call the care guide team at [phone] if you need to cancel or reschedule your appointment.   If you are experiencing a Mental Health or [place] or need someone to talk to, please call the Suicide and Crisis Lifeline: 988 call 911   Patient verbalizes understanding of instructions and care plan provided today and agrees to view in [place]. Active MyChart status and patient understanding of how to access instructions and care plan via MyChart confirmed with patient.     Christa See, MSW, [email or web address] Phone [phone] 11:57 AM

## 2022-09-03 ENCOUNTER — Ambulatory Visit: Payer: Self-pay | Admitting: Licensed Clinical Social Worker

## 2022-09-03 ENCOUNTER — Ambulatory Visit (HOSPITAL_BASED_OUTPATIENT_CLINIC_OR_DEPARTMENT_OTHER): Payer: Medicare Other | Admitting: Cardiovascular Disease

## 2022-09-03 NOTE — Progress Notes (Incomplete)
Cardiology Office Note   Date:  09/03/2022   ID:  [name], DOB [birth year], MRN [id number]  PCP:  [name], MD  Cardiologist:  [name], MD  Electrophysiologist:  None   Evaluation Performed:  Follow-Up Visit  Chief Complaint:  elevated heart rate  History of Present Illness:    [name] is a 77 y.o. female with chronic systolic and diastolic heart failure (LVEF 25 to 30%, nonischemic), PAF, PAD, diabetes, hyperlipidemia, hypertension, and tobacco abuse here for follow up.  She was previously a patient of Dr. Ron Parker and then Dr. Irish Lack.  She was first diagnosed with heart failure 06/2009.  At that time her LVEF was 25%.  This was associated with atrial fibrillation.  She reports having an ablation with Dr. [name] in 1999, though those records are not available at this time.  She has undergone cardioversions for atrial fibrillation with Dr. Ron Parker.  LVEF has ranged from 25 to 35% but has not improved despite beta-blockers and ACE inhibitor.  She had an echo 11/2011 that revealed LVEF 50 to 55%.  However subsequent studies have ranged from 20-35%.  LVEF on Lexiscan Myoview 09/2014 was 41%.  She developed a left bundle branch block pattern in 2014.  Her cardiomyopathy is nonischemic.  She last saw Dr. Irish Lack 04/2018 and carvedilol was increased due to concern for atrial tachycardia.  She felt fatigued and shaky at the higher dose.  Her ACE inhibitor was reduced 2/2 CKD III.  Prior to that she called the office 12/2017 with volume overload.  She previously discussed ICD with Dr. Irish Lack but was not interested.  We continued this discussion and she continues to want to think about it.  She was considering CRT-D after coronavirus.  Carvedilol was switched to metoprolol due to hypotension and tachycardia.  Her heart rate remained elevated but her energy levels improved.  Digoxin was started due to poor heart rate control and heart failure.  [name] had an episode of syncope.   She was referred for an ambulatory monitor which revealed atrial fibrillation with PVCs.  She saw Dr. [name] on 4/6 who thought she was actually in atrial tachycardia.  He recommended waiting on ICD to see if her LVEF  Improved and felt she was unlikely to benefit from CRT.  [name] underwent successful DCCV for atrial tachycardia on 10/15/19.  She had a lung screening CT that showed coronary calcification and emphysema.    She saw Dr. Gwenlyn Found for PAD on 09/2318.  He recommended continued medical management at this time as her symptoms were not significant and she had no CLI.  Two weeks prior she fell.   She was going up her steps and lost her balance.  She fell onto some flower.  She was unable to get up from the ground and had to call the fire department.  At that time her BP was [id number] systolic.  She struggled with pain and elevated heart rates since that time.  She had a virtual visit and her heart rate had been in the 110s to 120s.  Metoprolol was increased.  Last visit She remained in atrial flutter and was scheduled for cardioversion. However, she converted prior to the procedure. Today, she complains of cold-like symptoms including congestion and wheezing. Since increasing her metoprolol, she mentions that it has helped her feel better when moving around the house.  She reports that she has been eating and drinking well. While coming into the office today she was  feeling lightheaded/dizzy. Previously while off of furosemide for 2 weeks she was retaining fluid. Since resuming her furosemide, her swelling has resolved and her breathing has improved overall. She denies any palpitations, chest pain, shortness of breath, or peripheral edema. No headaches, syncope, orthopnea, or PND.   01/2022 she remained in atrial flutter and was scheduled for cardioversion. However, she converted prior to the procedure.  02/2022 Metoprolol had lessened her lethargy and allowed her to be more active however it was reduced due  to hypotension. She had been retaining fluid while off of Furosemide which improved since resuming it.  She saw Dr. Curt Bears 03/2022 and he discussed ablation which she was unsure of.  Today, the patient states that she  She denies any palpitations, chest pain, shortness of breath, or peripheral edema. No lightheadedness, headaches, syncope, orthopnea, or PND.    Past Medical History:  Diagnosis Date   Arthritis    "left knee" (03/12/2013)   Atrial fibrillation ([place])    Cardioverted to NSR 08/22/2009   Atrial tachycardia 09/30/2019   Cardiomyopathy    CHF (congestive heart failure) (HCC)    Chronic combined systolic and diastolic heart failure ([place]) 04/09/2021   CKD (chronic kidney disease)    Depression    Dizziness    DM2 (diabetes mellitus, type 2) (HCC)    fasting 80-100 (03/12/2013)   Drug therapy 1/11   coumadin   Ejection fraction < 50%    20-25%, echo.06/2009, /  EF 30-35%, diffuse hypokinesis, echo, Oct 16, 2010   GERD (gastroesophageal reflux disease)    Gout    "left knee" (03/12/2013)   History of bronchitis    History of emphysema ([place])    HLD (hyperlipidemia)    Hyperkalemia    Hypertension    LFT elevation    06/2009   Mitral regurgitation    mild - echo - 1/11 /  Mild, echo, May15, 2012   Overweight(278.02)    Pneumonia    "@ least twice" (03/12/2013)   Thyroid dysfunction    thyromegally,diffuse,nodule LLL,06/2009   Tobacco abuse    Tricuspid regurgitation    moderate - echo - 1/11   Past Surgical History:  Procedure Laterality Date   BIOPSY  09/29/2020   Procedure: BIOPSY;  Surgeon: Carol Ada, MD;  Location: [facility];  Service: Endoscopy;;   BREAST BIOPSY Right 1980's   BREAST LUMPECTOMY Right 1980's   CARDIOVERSION N/A 10/15/2019   Procedure: CARDIOVERSION;  Surgeon: [name], MD;  Location: [facility];  Service: Cardiovascular;  Laterality: N/A;   CATARACT EXTRACTION W/ INTRAOCULAR LENS  IMPLANT, BILATERAL Bilateral 2010-2012    CHOLECYSTECTOMY  ~ 1978   ESOPHAGOGASTRODUODENOSCOPY (EGD) WITH PROPOFOL N/A 09/29/2020   Procedure: ESOPHAGOGASTRODUODENOSCOPY (EGD) WITH PROPOFOL;  Surgeon: Carol Ada, MD;  Location: [facility];  Service: Endoscopy;  Laterality: N/A;   JOINT REPLACEMENT     TONSILLECTOMY  1960's   TOTAL KNEE ARTHROPLASTY Left 03/12/2013   TOTAL KNEE ARTHROPLASTY Left 03/12/2013   Procedure: TOTAL KNEE ARTHROPLASTY;  Surgeon: Newt Minion, MD;  Location: [facility] [place];  Service: Orthopedics;  Laterality: Left;  Left total knee arthroplasty   TUBAL LIGATION     UPPER ESOPHAGEAL ENDOSCOPIC ULTRASOUND (EUS) N/A 09/29/2020   Procedure: UPPER ESOPHAGEAL ENDOSCOPIC ULTRASOUND (EUS);  Surgeon: Carol Ada, MD;  Location: [facility];  Service: Endoscopy;  Laterality: N/A;   VAGINAL HYSTERECTOMY  1987     No outpatient medications have been marked as taking for the 09/03/22 encounter (Appointment) with [name], MD.  Allergies:   Codeine, Augmentin [amoxicillin-pot clavulanate], Coreg [carvedilol], and Warfarin and related   Social History   Tobacco Use   Smoking status: Every Day    Packs/day: 0.50    Years: 40.00    Additional pack years: 0.00    Total pack years: 20.00    Types: Cigarettes   Smokeless tobacco: Never   Tobacco comments:    smoked 1.5ppd x 9, 1ppdx40, cut back to 1/2ppd past 5 years  Vaping Use   Vaping Use: Never used  Substance Use Topics   Alcohol use: No   Drug use: No     Family Hx: The patient's family history includes Colon cancer in an other family member; Heart attack in her son; Heart disease in an other family member; Hypertension in her sister and son; Prostate cancer in her father; Stroke in her maternal grandfather, mother, and paternal grandmother. There is no history of Breast cancer.  ROS:   Please see the history of present illness.     All other systems reviewed and are negative.   Prior CV studies:    The following studies were reviewed  today:  Bilateral LE Arterial Doppler 09/10/2021: Summary:  Right: Atherosclerosis and medial calcifications throughout.  75-99% stenosis in the mid CFA.  Two vessel run-off via ATA and peroneal artery.   Left: Atherosclerosis and medial calcifications throughout.  30-49% stenosis in the proximal SFA distal to ostium.  One vessel run-off via ATA with elevated velocities in the proximal  segment suggesting 50-74% stenosis, based on velocity ratio of 3.4.    Echo 03/07/2020: 1. Since the prior study on 02/23/2019 LVEF has increased from 30-35% to  40-45%.   2. There is a false tending in the left ventricular apex. Left  ventricular ejection fraction, by estimation, is 40 to 45%. The left  ventricle has mildly decreased function. The left ventricle demonstrates  global hypokinesis. Left ventricular diastolic  parameters are consistent with Grade II diastolic dysfunction  (pseudonormalization). Elevated left atrial pressure. The average left  ventricular global longitudinal strain is -13.7 %. The global longitudinal  strain is abnormal.   3. Right ventricular systolic function is normal. The right ventricular  size is normal.   4. Left atrial size was moderately dilated.   5. The mitral valve is normal in structure. Mild mitral valve  regurgitation. No evidence of mitral stenosis. Severe mitral annular  calcification.   6. The aortic valve is normal in structure. There is mild calcification  of the aortic valve. There is mild thickening of the aortic valve. Aortic  valve regurgitation is not visualized. Mild to moderate aortic valve  sclerosis/calcification is present,  without any evidence of aortic stenosis.   7. The inferior vena cava is normal in size with greater than 50%  respiratory variability, suggesting right atrial pressure of 3 mmHg.    Long Term Monitor 09/2019: Indication Palpitations   Duration: <14d   Findings Atrial tach with 4:1>>1:1 conduction with rates up to  150s     Echo 07/28/15: Study Conclusions   - Left ventricle: The cavity size was normal. There was moderate   concentric hypertrophy. Systolic function was severely reduced.   The estimated ejection fraction was in the range of 25% to 30%.   Diffuse hypokinesis. Features are consistent with a pseudonormal   left ventricular filling pattern, with concomitant abnormal   relaxation and increased filling pressure (grade 2 diastolic   dysfunction). Doppler parameters are consistent with high   ventricular  filling pressure. - Aortic valve: Transvalvular velocity was within the normal range.   There was no stenosis. There was no regurgitation. - Mitral valve: Calcified annulus. Mildly thickened leaflets .   There was trivial regurgitation. - Left atrium: The atrium was mildly dilated. - Right ventricle: The cavity size was normal. Wall thickness was   normal. Systolic function was normal. - Atrial septum: A patent foramen ovale cannot be excluded. - Tricuspid valve: There was mild regurgitation. Peak RV-RA   gradient (S): 30 mm Hg. - Inferior vena cava: The vessel was normal in size. The   respirophasic diameter changes were in the normal range (= 50%),   consistent with normal central venous pressure.  Lexiscan Myoview 09/08/14: LVEF 41%.  No ischemia.  Echo 03/07/20:  1. Since the prior study on 02/23/2019 LVEF has increased from 30-35% to  40-45%.   2. There is a false tending in the left ventricular apex. Left  ventricular ejection fraction, by estimation, is 40 to 45%. The left  ventricle has mildly decreased function. The left ventricle demonstrates  global hypokinesis. Left ventricular diastolic  parameters are consistent with Grade II diastolic dysfunction  (pseudonormalization). Elevated left atrial pressure. The average left  ventricular global longitudinal strain is -13.7 %. The global longitudinal  strain is abnormal.   3. Right ventricular systolic function is normal. The  right ventricular  size is normal.   4. Left atrial size was moderately dilated.   5. The mitral valve is normal in structure. Mild mitral valve  regurgitation. No evidence of mitral stenosis. Severe mitral annular  calcification.   6. The aortic valve is normal in structure. There is mild calcification  of the aortic valve. There is mild thickening of the aortic valve. Aortic  valve regurgitation is not visualized. Mild to moderate aortic valve  sclerosis/calcification is present,  without any evidence of aortic stenosis.   7. The inferior vena cava is normal in size with greater than 50%  respiratory variability, suggesting right atrial pressure of 3 mmHg.   Labs/Other Tests and Data Reviewed:    EKG: EKG is personally reviewed.  09/03/2022: *** 02/26/2022: Sinus rhythm. Rate 71 bpm. First degree AV block. IVCD. 01/30/22: Atrial tachycardia vs atrial flutter. Rate 129 bpm.   04/22/19: Ectopic atrial rhythm. Rate 95 bpm. PVCs. IVCD. QRS 130 ms.  Recent Labs: 07/29/2022: ALT 16; BUN 37; Creatinine, Ser 1.84; Hemoglobin 13.0; Platelets 260; Potassium 4.0; Sodium 135; TSH 1.370   Recent Lipid Panel Lab Results  Component Value Date/Time   CHOL 161 06/04/2022 02:03 PM   TRIG 159 (H) 06/04/2022 02:03 PM   HDL 38 (L) 06/04/2022 02:03 PM   CHOLHDL 4.2 06/04/2022 02:03 PM   CHOLHDL 4.8 06/27/2009 03:28 AM   LDLCALC 95 06/04/2022 02:03 PM    Wt Readings from Last 3 Encounters:  07/29/22 212 lb 3.2 oz (96.3 kg)  06/13/22 207 lb (93.9 kg)  04/10/22 207 lb (93.9 kg)     Objective:    VS:  There were no vitals taken for this visit. , BMI There is no height or weight on file to calculate BMI. GENERAL:  Well appearing HEENT: Pupils equal round and reactive, fundi not visualized, oral mucosa unremarkable NECK:  No jugular venous distention, waveform within normal limits, carotid upstroke brisk and symmetric, no bruits, no thyromegaly LUNGS:  Clear to auscultation bilaterally HEART:  RRR.  PMI not displaced or sustained,S1 and S2 within normal limits, no S3, no S4, no clicks, no rubs,  no murmurs ABD:  Flat, positive bowel sounds normal in frequency in pitch, no bruits, no rebound, no guarding, no midline pulsatile mass, no hepatomegaly, no splenomegaly EXT:  2 plus pulses throughout, R ankle effusion ***.  No cyanosis no clubbing SKIN:  No rashes no nodules NEURO:  Cranial nerves II through XII grossly intact, motor grossly intact throughout PSYCH:  Cognitively intact, oriented to person place and time     ASSESSMENT & PLAN:    No problem-specific Assessment & Plan notes found for this encounter.    Medication Adjustments/Labs and Tests Ordered: Current medicines are reviewed at length with the patient today.  Concerns regarding medicines are outlined above.   Tests Ordered: No orders of the defined types were placed in this encounter.    Medication Changes: No orders of the defined types were placed in this encounter.   Disposition: FU with Tiffany C. [name], MD, [facility] in ***   I,Coren O'Brien,acting as a scribe for [name], MD.,have documented all relevant documentation on the behalf of [name], MD,as directed by  [name], MD while in the presence of [name], MD.

## 2022-09-04 NOTE — Patient Instructions (Signed)
Visit Information  Thank you for taking time to visit with me today. Please don't hesitate to contact me if I can be of assistance to you.   Following are the goals we discussed today:   Goals Addressed             This Visit's Progress    Obtain Supportive Resources-CT Assistance   On track    Activities and task to complete in order to accomplish goals.   Keep all upcoming appointments discussed today Continue with compliance of taking medication prescribed by Doctor Implement healthy coping skills discussed to assist with management of symptoms Continue working with [facility] care team to assist with goals identified Continue to decrease in tobacco use         Our next appointment is by telephone on 5/14 at 1 PM  Please call the care guide team at [phone] if you need to cancel or reschedule your appointment.   If you are experiencing a Mental Health or [place] or need someone to talk to, please call the Suicide and Crisis Lifeline: 988 call 911   Patient verbalizes understanding of instructions and care plan provided today and agrees to view in [place]. Active MyChart status and patient understanding of how to access instructions and care plan via MyChart confirmed with patient.     Christa See, MSW, [email or web address] Phone [phone] 8:57 AM

## 2022-09-04 NOTE — Patient Outreach (Signed)
  Care Coordination   Follow Up Visit Note   09/03/2022 Name: [name] MRN: [id number] DOB: [birth year]  [name] is a 77 y.o. year old female who sees [name], MD for primary care. I spoke with  [name] by phone today.  What matters to the patients health and wellness today?  Supportive Resources    Goals Addressed             This Visit's Progress    Obtain Supportive Resources-CT Assistance   On track    Activities and task to complete in order to accomplish goals.   Keep all upcoming appointments discussed today Continue with compliance of taking medication prescribed by Doctor Implement healthy coping skills discussed to assist with management of symptoms Continue working with [facility] care team to assist with goals identified Continue to decrease in tobacco use         SDOH assessments and interventions completed:  No     Care Coordination Interventions:  Yes, provided  Interventions Today    Flowsheet Row Most Recent Value  Chronic Disease   Chronic disease during today's visit Atrial Fibrillation (AFib), Hypertension (HTN), Diabetes, Chronic Kidney Disease/End Stage Renal Disease (ESRD)  General Interventions   General Interventions Discussed/Reviewed General Interventions Reviewed  [LCSW informed pt of financial options provided by [facility]. Pt has U card through insurance. Reviewed upcoming appts]  Mental Health Interventions   Mental Health Discussed/Reviewed Mental Health Reviewed, Coping Strategies  [Pt engages in gratitude thinking to assist with stress management. Validation and encouragement provided]       Follow up plan: Follow up call scheduled for 4-6 weeks    Encounter Outcome:  Pt. Visit Completed   [name], MSW, [email or web address] Phone [phone] 8:55 AM

## 2022-09-28 ENCOUNTER — Other Ambulatory Visit: Payer: Self-pay | Admitting: Internal Medicine

## 2022-09-30 ENCOUNTER — Inpatient Hospital Stay: Admission: RE | Admit: 2022-09-30 | Payer: Medicare Other | Source: Ambulatory Visit

## 2022-10-14 ENCOUNTER — Telehealth: Payer: Self-pay

## 2022-10-14 DIAGNOSIS — I4819 Other persistent atrial fibrillation: Secondary | ICD-10-CM

## 2022-10-14 NOTE — Telephone Encounter (Signed)
[name] had called [name] on 08/15/22 to go over ablation instructions but [name] wanted to reschedule.   I spoke with the [name] today and she is scheduled for 9/18. I also scheduled her a f/u with Dr. Elberta Fortis on 6/26. Per his last o/v he wanted to see her back in 3 mths so she is overdue.   [name] aware that we will schedule lab appointment and go over instructions during her o/v.

## 2022-10-15 ENCOUNTER — Ambulatory Visit: Payer: Self-pay | Admitting: Licensed Clinical Social Worker

## 2022-10-16 ENCOUNTER — Encounter: Payer: Self-pay | Admitting: Licensed Clinical Social Worker

## 2022-10-16 NOTE — Patient Outreach (Signed)
  Care Coordination   Follow Up Visit Note   10/15/2022 Name: [name] MRN: [id number] DOB: 1946-05-20  [name] is a 77 y.o. year old female who sees Shelby Peng, MD for primary care. I spoke with  [name] by phone today.  What matters to the patients health and wellness today?  Community Resources and Symptom Management    Goals Addressed             This Visit's Progress    Obtain Supportive Resources-CT Assistance   On track    Activities and task to complete in order to accomplish goals.   Keep all upcoming appointments discussed today Continue with compliance of taking medication prescribed by Doctor Implement healthy coping skills discussed to assist with management of symptoms Continue working with [facility] care team to assist with goals identified Continue to decrease in tobacco use         SDOH assessments and interventions completed:  No     Care Coordination Interventions:  Yes, provided  Interventions Today    Flowsheet Row Most Recent Value  Chronic Disease   Chronic disease during today's visit Atrial Fibrillation (AFib), Hypertension (HTN), Diabetes, Chronic Kidney Disease/End Stage Renal Disease (ESRD)  General Interventions   General Interventions Discussed/Reviewed General Interventions Reviewed, [facility], Doctor Visits  [LCSW reviewed upcoming appts. Pt shared reasons why ablation was r/s. Reports frustration with changes in insurance company resulting in higher patient deductibles/co-pays. LCSW discussed [facility] services]  Doctor Visits Discussed/Reviewed Doctor Visits Reviewed, Specialist  PCP/Specialist Visits Compliance with follow-up visit  Mental Health Interventions   Mental Health Discussed/Reviewed Mental Health Reviewed, Coping Strategies, Anxiety, Grief and Loss  [Pt receives limited support system. Discussed strategies to strenghten support system and decrease stress management]  Nutrition Interventions    Nutrition Discussed/Reviewed Nutrition Reviewed  Pharmacy Interventions   Pharmacy Dicussed/Reviewed Pharmacy Topics Reviewed  Safety Interventions   Safety Discussed/Reviewed Safety Reviewed, Home Safety  [Pt has a safe alert system in the home to promote safety in an emergency]       Follow up plan: Follow up call scheduled for 2-4 weeks    Encounter Outcome:  Pt. Visit Completed   Jenel Lucks, MSW, LCSW [facility] Care Management [facility]  [facility] [email or web address] Phone [phone] 1:37 PM

## 2022-10-16 NOTE — Patient Outreach (Signed)
  Care Coordination   Follow Up Visit Note   10/16/2022 Name: [name] MRN: [id number] DOB: 03-May-1946  [name] is a 77 y.o. year old female who sees Shelby Peng, MD for primary care. I  emailed supportive resources  What matters to the patients health and wellness today?  [facility]    Goals Addressed             This Visit's Progress    Obtain Supportive Resources   On track    Activities and task to complete in order to accomplish goals.   Keep all upcoming appointments discussed today Continue with compliance of taking medication prescribed by Doctor Implement healthy coping skills discussed to assist with management of symptoms Continue working with [facility] care team to assist with goals identified Continue to decrease in tobacco use Utilize resources to contact [facility] to inquire about healthcare options         SDOH assessments and interventions completed:  No     Care Coordination Interventions:  Yes, provided  Interventions Today    Flowsheet Row Most Recent Value  Chronic Disease   Chronic disease during today's visit Diabetes, Chronic Kidney Disease/End Stage Renal Disease (ESRD)  General Interventions   General Interventions Discussed/Reviewed Camera operator on [place] to assist with options regarding healthcare coverage]       Follow up plan: Follow up call scheduled for 06/11    Encounter Outcome:  Pt. Visit Completed   [name], MSW, [name] Phone [phone] 1:42 PM

## 2022-10-16 NOTE — Patient Instructions (Signed)
Visit Information  Thank you for taking time to visit with me today. Please don't hesitate to contact me if I can be of assistance to you.   Following are the goals we discussed today:   Goals Addressed             This Visit's Progress    Obtain Supportive Resources   On track    Activities and task to complete in order to accomplish goals.   Keep all upcoming appointments discussed today Continue with compliance of taking medication prescribed by Doctor Implement healthy coping skills discussed to assist with management of symptoms Continue working with [facility] care team to assist with goals identified Continue to decrease in tobacco use Utilize resources to contact [facility] to inquire about healthcare options         Our next appointment is by telephone on 06/11 at 1 PM  Please call the care guide team at [phone] if you need to cancel or reschedule your appointment.   If you are experiencing a Mental Health or Behavioral Health Crisis or need someone to talk to, please call the Suicide and Crisis Lifeline: 988 call 911   Patient verbalizes understanding of instructions and care plan provided today and agrees to view in MyChart. Active MyChart status and patient understanding of how to access instructions and care plan via MyChart confirmed with patient.     Jenel Lucks, MSW, LCSW [facility] Care Management [place]  [facility] [email or web address] Phone [phone] 1:43 PM

## 2022-10-16 NOTE — Patient Instructions (Signed)
Visit Information  Thank you for taking time to visit with me today. Please don't hesitate to contact me if I can be of assistance to you.   Following are the goals we discussed today:   Goals Addressed             This Visit's Progress    Obtain Supportive Resources-CT Assistance   On track    Activities and task to complete in order to accomplish goals.   Keep all upcoming appointments discussed today Continue with compliance of taking medication prescribed by Doctor Implement healthy coping skills discussed to assist with management of symptoms Continue working with [facility] care team to assist with goals identified Continue to decrease in tobacco use         Our next appointment is by telephone on 06/11 at 1 PM  Please call the care guide team at [phone] if you need to cancel or reschedule your appointment.   If you are experiencing a Mental Health or Behavioral Health Crisis or need someone to talk to, please call the Suicide and Crisis Lifeline: 988 call 911   Patient verbalizes understanding of instructions and care plan provided today and agrees to view in MyChart. Active MyChart status and patient understanding of how to access instructions and care plan via MyChart confirmed with patient.     Jenel Lucks, MSW, LCSW [facility] Care Management [place]  [facility] [email or web address] Phone [phone] 1:39 PM

## 2022-10-25 ENCOUNTER — Other Ambulatory Visit: Payer: Self-pay | Admitting: Internal Medicine

## 2022-10-29 ENCOUNTER — Ambulatory Visit: Payer: Medicare Other | Admitting: Internal Medicine

## 2022-10-31 ENCOUNTER — Ambulatory Visit: Payer: Self-pay

## 2022-10-31 ENCOUNTER — Telehealth (INDEPENDENT_AMBULATORY_CARE_PROVIDER_SITE_OTHER): Payer: Medicare Other | Admitting: Family Medicine

## 2022-10-31 ENCOUNTER — Encounter: Payer: Self-pay | Admitting: Family Medicine

## 2022-10-31 ENCOUNTER — Ambulatory Visit: Payer: Medicare Other

## 2022-10-31 VITALS — BP 104/70 | HR 110

## 2022-10-31 DIAGNOSIS — M109 Gout, unspecified: Secondary | ICD-10-CM

## 2022-10-31 DIAGNOSIS — R509 Fever, unspecified: Secondary | ICD-10-CM

## 2022-10-31 DIAGNOSIS — F17200 Nicotine dependence, unspecified, uncomplicated: Secondary | ICD-10-CM | POA: Diagnosis not present

## 2022-10-31 DIAGNOSIS — R059 Cough, unspecified: Secondary | ICD-10-CM

## 2022-10-31 MED ORDER — DICLOFENAC SODIUM 1 % EX CREA: 1.0000 g | TOPICAL_CREAM | Freq: Three times a day (TID) | CUTANEOUS | 0 refills | Status: AC | PRN

## 2022-10-31 NOTE — Patient Instructions (Signed)
Visit Information  Thank you for taking time to visit with me today. Please don't hesitate to contact me if I can be of assistance to you.   Following are the goals we discussed today:   Goals Addressed             This Visit's Progress    To improve kidney function       Care Coordination Interventions: Assessed the Patient understanding of chronic kidney disease    Reinforced to patient the importance of increasing daily water intake to 48-64 oz daily unless otherwise directed  Review of patient status, including review of consultant's reports, relevant laboratory and other test results Last practice recorded BP readings:  BP Readings from Last 3 Encounters:  07/29/22 104/68  06/13/22 132/80  03/28/22 138/72  Most recent eGFR/CrCl:  Lab Results  Component Value Date   EGFR 28 (L) 07/29/2022    No components found for: "CRCL"      To keep A1c <7.0%       Care Coordination Interventions: Determined patient is experiencing elevated FBS >120 x 2 days and her normal usually runs in the low 90's Discussed patient is experiencing throbbing, aching pain in her left index and thumb joints with moderate swelling noted  Educated patient about the basic disease process related to diabetes and the potential to have change in sugars during illness, infection, pain and or inflammation to other organs/body systems Sent in basket message to Dr. Allyne Gee advising of patient's symptoms and discussed patient will have a call to perform a virtual visit with her today at 2:30 and patient verbalizes understanding Noted patient is scheduled for a chest CT today at [facility], however she will call to reschedule, provided patient of the contact number and advised it is noted she will have a $75 no show fee if she does not show up or call to reschedule and patient verbalizes understanding         Our next appointment is by telephone on 12/31/22 at 12 PM  Please call the care guide team at  [phone] if you need to cancel or reschedule your appointment.   If you are experiencing a Mental Health or Behavioral Health Crisis or need someone to talk to, please call 1-800-273-TALK (toll free, 24 hour hotline)  Patient verbalizes understanding of instructions and care plan provided today and agrees to view in MyChart. Active MyChart status and patient understanding of how to access instructions and care plan via MyChart confirmed with patient.     Delsa Sale, RN, BSN, CCM Care Management Coordinator [facility] Care Management Direct Phone: [phone]

## 2022-10-31 NOTE — Progress Notes (Signed)
Virtual Visit via Telephone   This visit type was conducted due to national recommendations for restrictions regarding the COVID-19 Pandemic (e.g. social distancing) in an effort to limit this patient's exposure and mitigate transmission in our community.  Due to her co-morbid illnesses, this patient is at least at moderate risk for complications without adequate follow up.  This format is felt to be most appropriate for this patient at this time.  All issues noted in this document were discussed and addressed.  A limited physical exam was performed with this format.    This visit type was conducted due to national recommendations for restrictions regarding the COVID-19 Pandemic (e.g. social distancing) in an effort to limit this patient's exposure and mitigate transmission in our community.  Patients identity confirmed using two different identifiers.  This format is felt to be most appropriate for this patient at this time.  All issues noted in this document were discussed and addressed.  No physical exam was performed (except for noted visual exam findings with Video Visits).    Date: 10/31/2022   ID:  [name], DOB 07-18-1945, MRN [id number]  Patient Location:  Home  Provider location:   Office    Chief Complaint: Hand pain  History of Present Illness:    [name] is a 77 y.o. female who presents via telephone conferencing for a telehealth visit today.    The patient does not have symptoms concerning for COVID-19 infection (fever, chills, cough, or new shortness of breath).   Patient is on the phone with complains of pain in her left hand(Index and thumb fingers on the hand are swollen and painful.Patient states the first episode happened two weeks ago and lasted for a week  then on  10/31/2022, she had a 2nd episode marked by swelling, pain etc The fingers are so swollen and painful that she couldn't go out of the house today to go run errands, patient thinks she might  be having exacerbation of gout. Patient states she took some of the Tramadol tablet that she had at home for relief.  Hand Pain  The incident occurred 12 to 24 hours ago. Incident location: No incidence. There was no injury mechanism. The pain is present in the left fingers and left hand (left thumb and index finger). The quality of the pain is described as aching and cramping. The pain does not radiate. The pain is at a severity of 4/10. The pain is mild. The pain has been Fluctuating since the incident. Pertinent negatives include no chest pain. The symptoms are aggravated by movement. She has tried acetaminophen and rest for the symptoms. The treatment provided mild relief.     Past Medical History:  Diagnosis Date   Arthritis    "left knee" (03/12/2013)   Atrial fibrillation (HCC)    Cardioverted to NSR 08/22/2009   Atrial tachycardia 09/30/2019   Cardiomyopathy    CHF (congestive heart failure) (HCC)    Chronic combined systolic and diastolic heart failure (HCC) 04/09/2021   CKD (chronic kidney disease)    Depression    Dizziness    DM2 (diabetes mellitus, type 2) (HCC)    fasting 80-100 (03/12/2013)   Drug therapy 1/11   coumadin   Ejection fraction < 50%    20-25%, echo.06/2009, /  EF 30-35%, diffuse hypokinesis, echo, Oct 16, 2010   GERD (gastroesophageal reflux disease)    Gout    "left knee" (03/12/2013)   History of bronchitis    History  of emphysema (HCC)    HLD (hyperlipidemia)    Hyperkalemia    Hypertension    LFT elevation    06/2009   Mitral regurgitation    mild - echo - 1/11 /  Mild, echo, May15, 2012   Overweight(278.02)    Pneumonia    "@ least twice" (03/12/2013)   Thyroid dysfunction    thyromegally,diffuse,nodule LLL,06/2009   Tobacco abuse    Tricuspid regurgitation    moderate - echo - 1/11   Past Surgical History:  Procedure Laterality Date   BIOPSY  09/29/2020   Procedure: BIOPSY;  Surgeon: Jeani Hawking, MD;  Location: [facility] ENDOSCOPY;  Service:  Endoscopy;;   BREAST BIOPSY Right 1980's   BREAST LUMPECTOMY Right 1980's   CARDIOVERSION N/A 10/15/2019   Procedure: CARDIOVERSION;  Surgeon: Chilton Si, MD;  Location: [facility]COPY;  Service: Cardiovascular;  Laterality: N/A;   CATARACT EXTRACTION W/ INTRAOCULAR LENS  IMPLANT, BILATERAL Bilateral 2010-2012   CHOLECYSTECTOMY  ~ 1978   ESOPHAGOGASTRODUODENOSCOPY (EGD) WITH PROPOFOL N/A 09/29/2020   Procedure: ESOPHAGOGASTRODUODENOSCOPY (EGD) WITH PROPOFOL;  Surgeon: Jeani Hawking, MD;  Location: [facility] ENDOSCOPY;  Service: Endoscopy;  Laterality: N/A;   JOINT REPLACEMENT     TONSILLECTOMY  1960's   TOTAL KNEE ARTHROPLASTY Left 03/12/2013   TOTAL KNEE ARTHROPLASTY Left 03/12/2013   Procedure: TOTAL KNEE ARTHROPLASTY;  Surgeon: Nadara Mustard, MD;  Location: [facility] OR;  Service: Orthopedics;  Laterality: Left;  Left total knee arthroplasty   TUBAL LIGATION     UPPER ESOPHAGEAL ENDOSCOPIC ULTRASOUND (EUS) N/A 09/29/2020   Procedure: UPPER ESOPHAGEAL ENDOSCOPIC ULTRASOUND (EUS);  Surgeon: Jeani Hawking, MD;  Location: [facility];  Service: Endoscopy;  Laterality: N/A;   VAGINAL HYSTERECTOMY  1987     Current Meds  Medication Sig   acetaminophen (TYLENOL) 500 MG tablet Take 1,000 mg by mouth 3 (three) times daily as needed for pain.   apixaban (ELIQUIS) 5 MG TABS tablet Take 1 tablet (5 mg total) by mouth 2 (two) times daily.   BD PEN NEEDLE NANO U/F 32G X 4 MM MISC USE AS DIRECTED WITH INSULIN PENS   betamethasone valerate ointment (VALISONE) 0.1 % Apply 1 Application topically 2 (two) times daily. prn (Patient taking differently: Apply 1 Application topically daily as needed (Dry skin).)   Cholecalciferol (VITAMIN D) 2000 UNITS CAPS Take 2,000 Units by mouth daily.   diclofenac Sodium (VOLTAREN) 1 % GEL Apply 4 g topically 4 (four) times daily. (Patient taking differently: Apply 4 g topically 4 (four) times daily as needed (pain).)   Diclofenac Sodium 1 % CREA Apply 1 g topically 3 (three)  times daily as needed for up to 7 days.   digoxin (LANOXIN) 0.125 MG tablet Take 1 tablet (125 mcg total) by mouth daily. Please keep your upcoming appointment for refills.   enalapril (VASOTEC) 5 MG tablet Take 1 tablet (5 mg total) by mouth at bedtime. Please keep your upcoming appointment for refills.   ezetimibe (ZETIA) 10 MG tablet Take 1 tablet (10 mg total) by mouth daily.   furosemide (LASIX) 40 MG tablet TAKE 1 TABLET DAILY AS NEEDED FOR EDEMA FOR FLUID OVERLOAD. PLEASE KEEP UPCOMING APPOINTMENT   glucose blood (ONETOUCH VERIO) test strip USE AS DIRECTED TO CHECK BLOOD SUGARS 1 TIME PER DAY   LIVALO 2 MG TABS TAKE 1 TABLET BY MOUTH EVERY DAY   metoprolol succinate (TOPROL-XL) 50 MG 24 hr tablet Take 1 tablet (50 mg total) by mouth daily. Take with or immediately following a meal.  Multiple Vitamin (MULITIVITAMIN WITH MINERALS) TABS Take 1 tablet by mouth daily. Centrum Silver   nystatin cream (MYCOSTATIN) Apply 1 Application topically 2 (two) times daily as needed for dry skin (under breasts).   pantoprazole (PROTONIX) 20 MG tablet TAKE 1 TABLET BY MOUTH DAILY AS NEEDED FOR HEARTBURN.   Semaglutide,0.25 or 0.5MG /DOS, (OZEMPIC, 0.25 OR 0.5 MG/DOSE,) 2 MG/3ML SOPN INJECT 0.25MG  INTO THE SKIN ONE TIME PER WEEK   tiZANidine (ZANAFLEX) 4 MG tablet TAKE 1 TABLET (4 MG TOTAL) BY MOUTH DAILY AS NEEDED FOR BACK PAIN   traMADol (ULTRAM) 50 MG tablet Take 1 tablet (50 mg total) by mouth every 6 (six) hours as needed.   vitamin C (ASCORBIC ACID) 500 MG tablet Take 500 mg by mouth daily.     Allergies:   Codeine, Augmentin [amoxicillin-pot clavulanate], Coreg [carvedilol], and Warfarin and related   Social History   Tobacco Use   Smoking status: Every Day    Packs/day: 0.50    Years: 40.00    Additional pack years: 0.00    Total pack years: 20.00    Types: Cigarettes   Smokeless tobacco: Never   Tobacco comments:    smoked 1.5ppd x 9, 1ppdx40, cut back to 1/2ppd past 5 years  Vaping Use    Vaping Use: Never used  Substance Use Topics   Alcohol use: No   Drug use: No     Family Hx: The patient's family history includes Colon cancer in an other family member; Heart attack in her son; Heart disease in an other family member; Hypertension in her sister and son; Prostate cancer in her father; Stroke in her maternal grandfather, mother, and paternal grandmother. There is no history of Breast cancer.  ROS:   Please see the history of present illness.    Review of Systems  Constitutional: Negative.  Negative for fever.  Respiratory: Negative.    Cardiovascular:  Negative for chest pain.       Elevated heart rate per patient  Musculoskeletal:  Positive for joint pain.       Thumb and index finger of the left hand; painful and swollen  Skin: Negative.     All other systems reviewed and are negative.   Labs/Other Tests and Data Reviewed:    Recent Labs: 07/29/2022: ALT 16; BUN 37; Creatinine, Ser 1.84; Hemoglobin 13.0; Platelets 260; Potassium 4.0; Sodium 135; TSH 1.370   Recent Lipid Panel Lab Results  Component Value Date/Time   CHOL 161 06/04/2022 02:03 PM   TRIG 159 (H) 06/04/2022 02:03 PM   HDL 38 (L) 06/04/2022 02:03 PM   CHOLHDL 4.2 06/04/2022 02:03 PM   CHOLHDL 4.8 06/27/2009 03:28 AM   LDLCALC 95 06/04/2022 02:03 PM    Wt Readings from Last 3 Encounters:  07/29/22 212 lb 3.2 oz (96.3 kg)  06/13/22 207 lb (93.9 kg)  04/10/22 207 lb (93.9 kg)     Exam:    Vital Signs:  BP 104/70   Pulse (!) 110     Physical Exam Vitals reviewed: This is a telehealth visit, no physical assessment could be done.   Acute Gout of Left Hand  ASSESSMENT & PLAN:     COVID-19 Education: The signs and symptoms of COVID-19 were discussed with the patient and how to seek care for testing (follow up with PCP or arrange E-visit).  The importance of social distancing was discussed today.  Patient Risk:   After full review of this patients clinical status, I feel that they  are at least moderate risk at this time.  Time:   Today, I have spent 17 minutes/ seconds with the patient with telehealth technology discussing above diagnoses.     Medication Adjustments/Labs and Tests Ordered: Current medicines are reviewed at length with the patient today.  Concerns regarding medicines are outlined above.   Tests Ordered: No orders of the defined types were placed in this encounter.   Medication Changes: Meds ordered this encounter  Medications   Diclofenac Sodium 1 % CREA    Sig: Apply 1 g topically 3 (three) times daily as needed for up to 7 days.    Dispense:  120 g    Refill:  0    Disposition:  Follow up as previously scheduled.  Signed, Chayil Gantt Moshe Salisbury, NP

## 2022-10-31 NOTE — Patient Outreach (Signed)
  Care Coordination   Follow Up Visit Note   10/31/2022 Name: [name] MRN: [id number] DOB: 09-04-1945  CALENA HULTS is a 77 y.o. year old female who sees Dorothyann Peng, MD for primary care. I spoke with  [name] by phone today.  What matters to the patients health and wellness today?  Patient would like to have her left index finger and thumb evaluated by her PCP.     Goals Addressed             This Visit's Progress    To improve kidney function       Care Coordination Interventions: Assessed the Patient understanding of chronic kidney disease    Reinforced to patient the importance of increasing daily water intake to 48-64 oz daily unless otherwise directed  Review of patient status, including review of consultant's reports, relevant laboratory and other test results Last practice recorded BP readings:  BP Readings from Last 3 Encounters:  07/29/22 104/68  06/13/22 132/80  03/28/22 138/72  Most recent eGFR/CrCl:  Lab Results  Component Value Date   EGFR 28 (L) 07/29/2022    No components found for: "CRCL"      To keep A1c <7.0%       Care Coordination Interventions: Determined patient is experiencing elevated FBS >120 x 2 days and her normal usually runs in the low 90's Discussed patient is experiencing throbbing, aching pain in her left index and thumb joints with moderate swelling noted  Educated patient about the basic disease process related to diabetes and the potential to have change in sugars during illness, infection, pain and or inflammation to other organs/body systems Sent in basket message to Dr. Allyne Gee advising of patient's symptoms and discussed patient will have a call to perform a virtual visit with her today at 2:30 and patient verbalizes understanding Noted patient is scheduled for a chest CT today at [facility], however she will call to reschedule, provided patient of the contact number and advised it is noted she will have a $75  no show fee if she does not show up or call to reschedule and patient verbalizes understanding     Interventions Today    Flowsheet Row Most Recent Value  Chronic Disease   Chronic disease during today's visit Diabetes, Chronic Kidney Disease/End Stage Renal Disease (ESRD), Other  [left finger/thumb joint pain and swelling]  General Interventions   General Interventions Discussed/Reviewed General Interventions Discussed, General Interventions Reviewed, Doctor Visits, Labs, Communication with  Doctor Visits Discussed/Reviewed Doctor Visits Discussed, Doctor Visits Reviewed, PCP  Communication with PCP/Specialists  [Dr. Sanders]  Education Interventions   Education Provided Provided Education  Provided Verbal Education On When to see the doctor, Labs  Labs Reviewed Hgb A1c, Kidney Function  Nutrition Interventions   Nutrition Discussed/Reviewed Fluid intake, Nutrition Discussed, Nutrition Reviewed  Pharmacy Interventions   Pharmacy Dicussed/Reviewed Pharmacy Topics Discussed, Pharmacy Topics Reviewed, Medications and their functions          SDOH assessments and interventions completed:  No     Care Coordination Interventions:  Yes, provided   Follow up plan: Follow up call scheduled for 12/31/22 @12  pm    Encounter Outcome:  Pt. Visit Completed

## 2022-11-01 ENCOUNTER — Telehealth: Payer: Self-pay

## 2022-11-01 ENCOUNTER — Other Ambulatory Visit: Payer: Self-pay | Admitting: Internal Medicine

## 2022-11-01 NOTE — Progress Notes (Signed)
Care Management & Coordination Services Pharmacy Team  Reason for Encounter: Medication coordination and delivery  Contacted patient to discuss medications and coordinate delivery from [facility]. Spoke with patient on 11/01/2022  Cycle dispensing form reviewed.   Last adherence delivery date:New Onboard      Patient is due for next adherence delivery on: 11/13/2022  This delivery to include: Vials  30 Days  Metoprolol 50 mg- 1 tablet daily Livalo/Pitavastatin 2 mg- 1 tablet once daily Mon-Sat Zetia 10 mg- 1 tablet daily Enalapril 5 mg- 1 tablet at bedtime Eliquis 5 mg- 1 tablet twice daily Digoxin 0.125 mg- 1 tablet daily Tramadol 50 mg- 1 tablet every 6 hours as needed for pain- new prescription requested.   Patient declined the following medications this month: Furosemide 40 mg- Received yesterday Ozempic 0.25/0.5 mg- Received a few days ago One touch strips- Received a few days ago Pantoprazole 20 mg- uses PRN ( has a full bottle and 1/2 bottle)   Refills requested from providers include: Patient requesting Tramadol refill, she has about 6 pills left.   Confirmed delivery date of 11/13/2022, advised patient that pharmacy will contact them the morning of delivery.   Any concerns about your medications? No  How often do you forget or accidentally miss a dose? Never  Do you use a pillbox? Yes  Is patient in packaging No  If yes  What is the date on your next pill pack?  Any concerns or issues with your packaging?   Recent blood pressure readings are as follows:107/61- today's reading  Recent blood glucose readings are as follows:124 fasting,  91 non fasting- today's reading  *Patient is very happy and impressed with her deliveries with [facility], she stated all of the pharmacy drivers and her pharmacy calls from technicians were very polite and helpful. She likes how they call before coming which gets her ready to meet them at the door and they are all  nice and personable. They even take time to ask her how her day is and have a conversation. She is very happy with her pharmacy change.    Chart review: Recent office visits:  10/31/2022- Ellender Hose, NP (PCP)- Diclofenac Sodium 1% gel changed from 4g topically 4 times a day as needed for pain to Diclofenac Sodium 1% cream Apply 1 g topically 3 (three) times daily as needed for up to 7 days  10/31/2022- Jeb Levering (CCM) No medication changes 10/15/2022- Jenel Lucks, LCSW (CCM)- No Medication changes 09/03/2022- Jenel Lucks, LCSW (CCM)- No Medication changes 08/27/2022- Jenel Lucks, LCSW (CCM)- No Medication changes  Recent consult visits:  08/12/2022- Louie Bun, MD (Nephrologist)- No Medication changes  Hospital visits:  None in previous 6 months  Medications: Outpatient Encounter Medications as of 11/01/2022  Medication Sig   acetaminophen (TYLENOL) 500 MG tablet Take 1,000 mg by mouth 3 (three) times daily as needed for pain.   apixaban (ELIQUIS) 5 MG TABS tablet Take 1 tablet (5 mg total) by mouth 2 (two) times daily.   BD PEN NEEDLE NANO U/F 32G X 4 MM MISC USE AS DIRECTED WITH INSULIN PENS   betamethasone valerate ointment (VALISONE) 0.1 % Apply 1 Application topically 2 (two) times daily. prn (Patient taking differently: Apply 1 Application topically daily as needed (Dry skin).)   Cholecalciferol (VITAMIN D) 2000 UNITS CAPS Take 2,000 Units by mouth daily.   diclofenac Sodium (VOLTAREN) 1 % GEL Apply 4 g topically 4 (four) times daily. (Patient taking differently: Apply 4 g topically 4 (  four) times daily as needed (pain).)   Diclofenac Sodium 1 % CREA Apply 1 g topically 3 (three) times daily as needed for up to 7 days.   digoxin (LANOXIN) 0.125 MG tablet Take 1 tablet (125 mcg total) by mouth daily. Please keep your upcoming appointment for refills.   enalapril (VASOTEC) 5 MG tablet Take 1 tablet (5 mg total) by mouth at bedtime. Please keep your upcoming appointment for  refills.   ezetimibe (ZETIA) 10 MG tablet Take 1 tablet (10 mg total) by mouth daily.   furosemide (LASIX) 40 MG tablet TAKE 1 TABLET DAILY AS NEEDED FOR EDEMA FOR FLUID OVERLOAD. PLEASE KEEP UPCOMING APPOINTMENT   glucose blood (ONETOUCH VERIO) test strip USE AS DIRECTED TO CHECK BLOOD SUGARS 1 TIME PER DAY   KLOR-CON M20 20 MEQ tablet TAKE 1 TABLET (20 MEQ TOTAL) BY MOUTH DAILY. WHEN TAKING FUROSEMIDE (LASIX). (Patient not taking: Reported on 10/31/2022)   LIVALO 2 MG TABS TAKE 1 TABLET BY MOUTH EVERY DAY   metoprolol succinate (TOPROL-XL) 50 MG 24 hr tablet Take 1 tablet (50 mg total) by mouth daily. Take with or immediately following a meal.   Multiple Vitamin (MULITIVITAMIN WITH MINERALS) TABS Take 1 tablet by mouth daily. Centrum Silver   nystatin cream (MYCOSTATIN) Apply 1 Application topically 2 (two) times daily as needed for dry skin (under breasts).   pantoprazole (PROTONIX) 20 MG tablet TAKE 1 TABLET BY MOUTH DAILY AS NEEDED FOR HEARTBURN.   Semaglutide,0.25 or 0.5MG /DOS, (OZEMPIC, 0.25 OR 0.5 MG/DOSE,) 2 MG/3ML SOPN INJECT 0.25MG  INTO THE SKIN ONE TIME PER WEEK   tiZANidine (ZANAFLEX) 4 MG tablet TAKE 1 TABLET (4 MG TOTAL) BY MOUTH DAILY AS NEEDED FOR BACK PAIN   traMADol (ULTRAM) 50 MG tablet Take 1 tablet (50 mg total) by mouth every 6 (six) hours as needed.   vitamin C (ASCORBIC ACID) 500 MG tablet Take 500 mg by mouth daily.   No facility-administered encounter medications on file as of 11/01/2022.   BP Readings from Last 3 Encounters:  10/31/22 104/70  07/29/22 104/68  06/13/22 132/80    Pulse Readings from Last 3 Encounters:  10/31/22 (!) 110  07/29/22 (!) 112  03/28/22 60    Lab Results  Component Value Date/Time   HGBA1C 6.3 (H) 07/29/2022 05:00 PM   HGBA1C 6.2 (H) 03/25/2022 03:26 PM   Lab Results  Component Value Date   CREATININE 1.84 (H) 07/29/2022   BUN 37 (H) 07/29/2022   GFRNONAA 30 (L) 03/02/2020   GFRAA 35 (L) 03/02/2020   NA 135 07/29/2022   K 4.0  07/29/2022   CALCIUM 9.6 07/29/2022   CO2 20 07/29/2022     Billee Cashing, CMA Clinical Pharmacist Assistant [phone]

## 2022-11-04 ENCOUNTER — Encounter: Payer: Self-pay | Admitting: Family Medicine

## 2022-11-04 DIAGNOSIS — F17200 Nicotine dependence, unspecified, uncomplicated: Secondary | ICD-10-CM | POA: Insufficient documentation

## 2022-11-04 DIAGNOSIS — M109 Gout, unspecified: Secondary | ICD-10-CM | POA: Insufficient documentation

## 2022-11-06 NOTE — Addendum Note (Signed)
Addended byMoshe Salisbury, Aulani Shipton E on: 11/06/2022 09:12 AM   Modules accepted: Level of Service

## 2022-11-12 ENCOUNTER — Ambulatory Visit: Payer: Self-pay | Admitting: Licensed Clinical Social Worker

## 2022-11-12 IMAGING — US US RENAL
1 series · 14 of 25 positions shown · non-contrast
Comparison: None available.

CLINICAL DATA: Type 2 diabetes mellitus with stage III B chronic
kidney disease with long-term use of insulin.

EXAM:
RENAL / URINARY TRACT ULTRASOUND COMPLETE

[Series 1: us renal · 0.19mm/px · 14 of 27 slices shown]
[im 1/27]
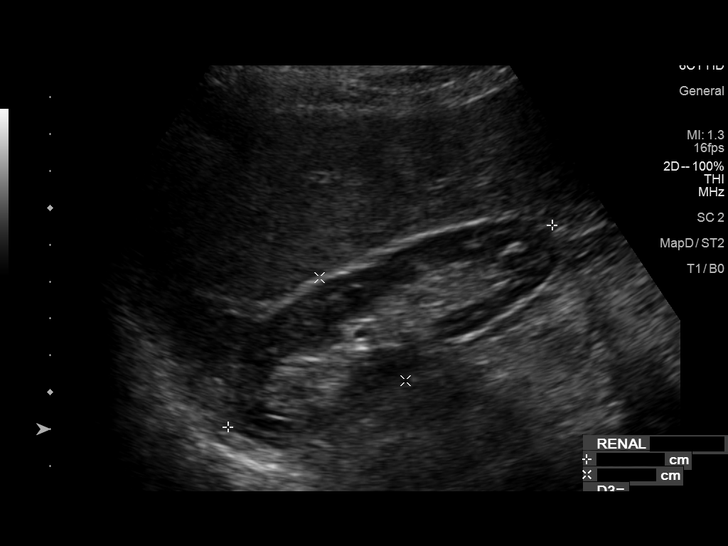
[im 3/27]
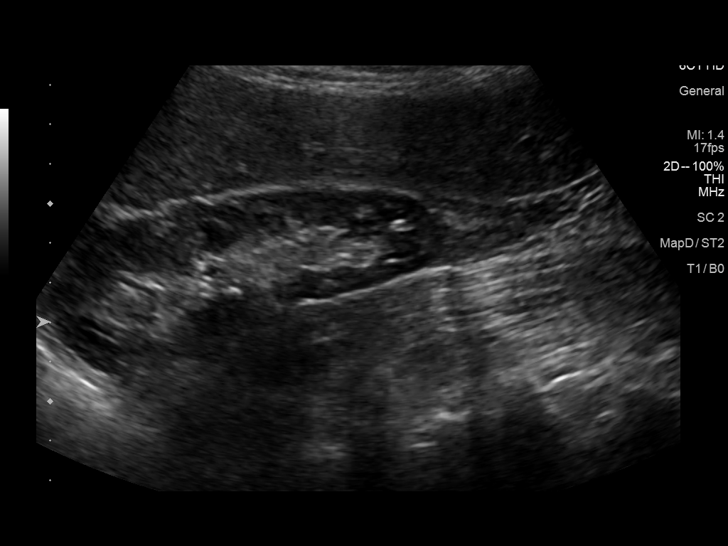
[im 5/27]
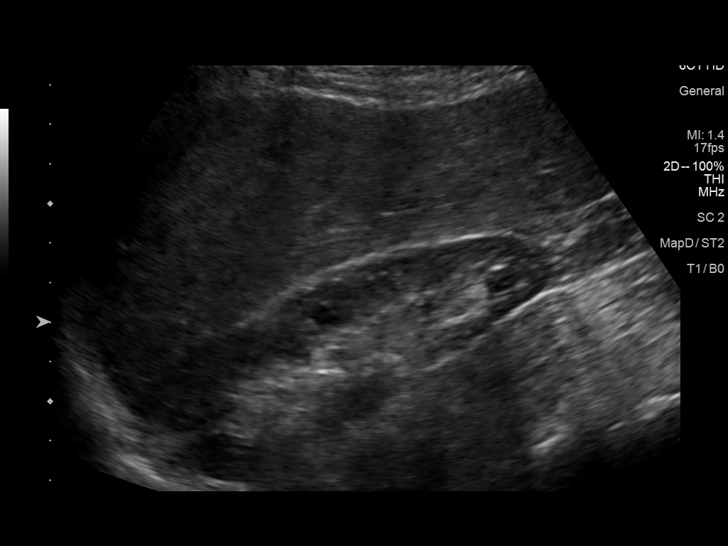
[im 7/27]
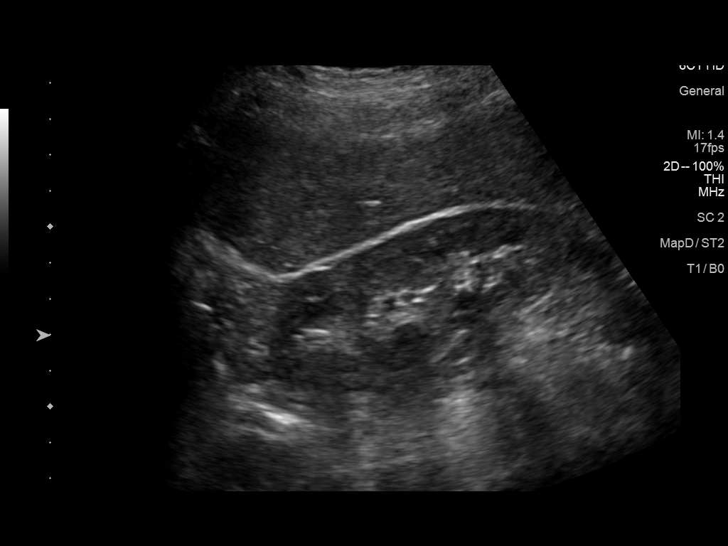
[im 9/27]
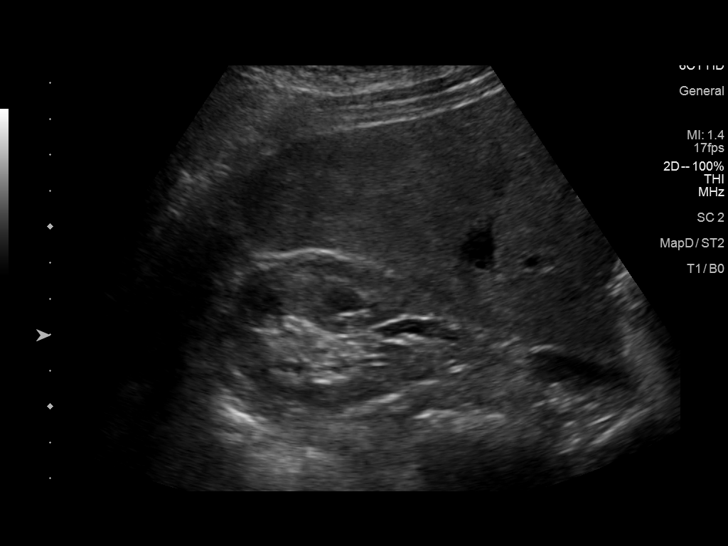
[im 10/27]
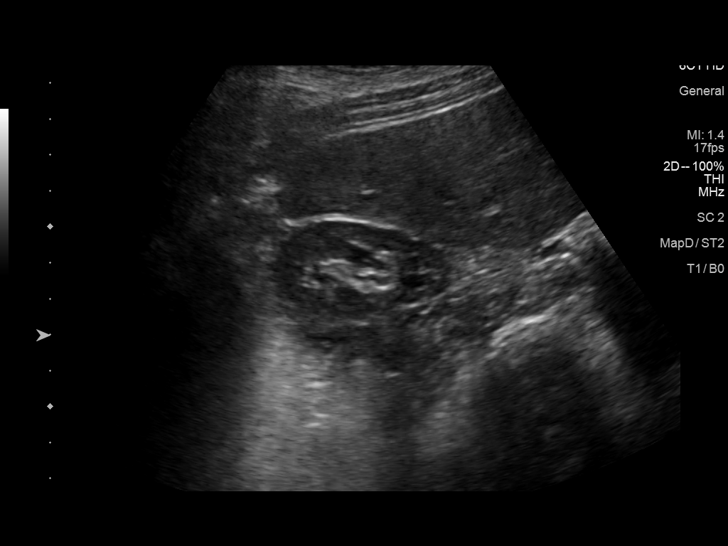
[im 12/27]
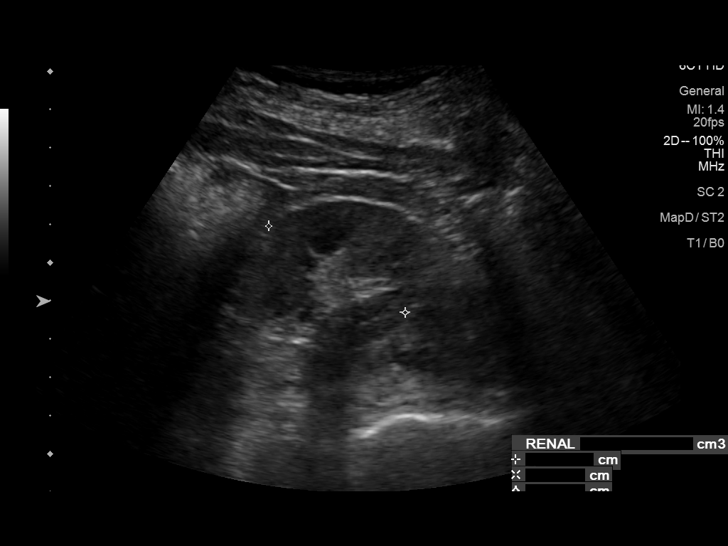
[im 15/27]
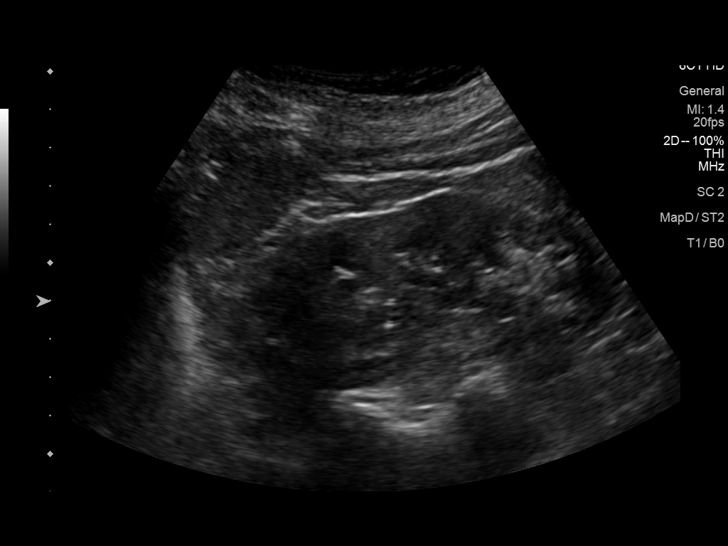
[im 17/27]
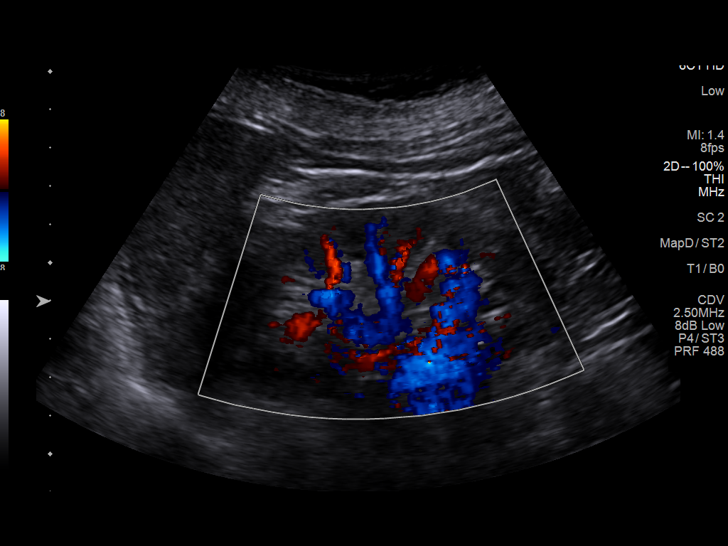
[im 18/27]
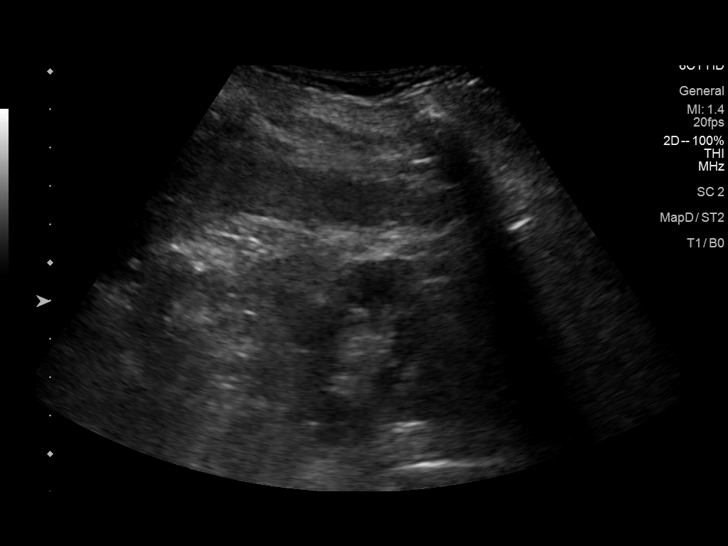
[im 20/27]
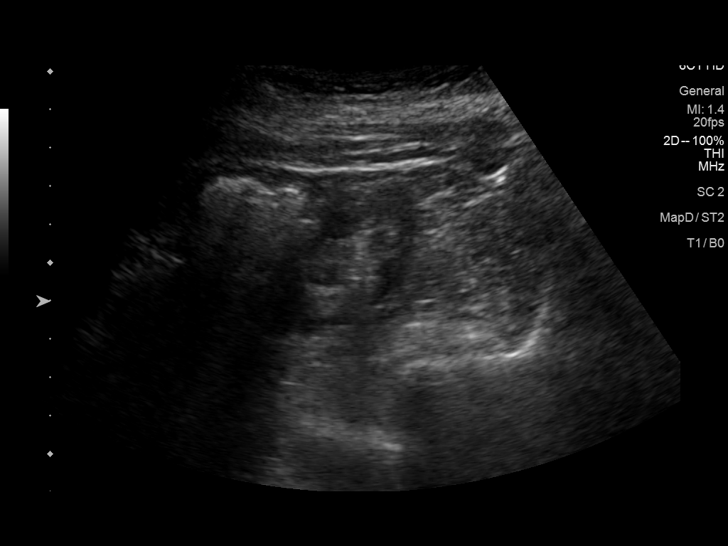
[im 22/27]
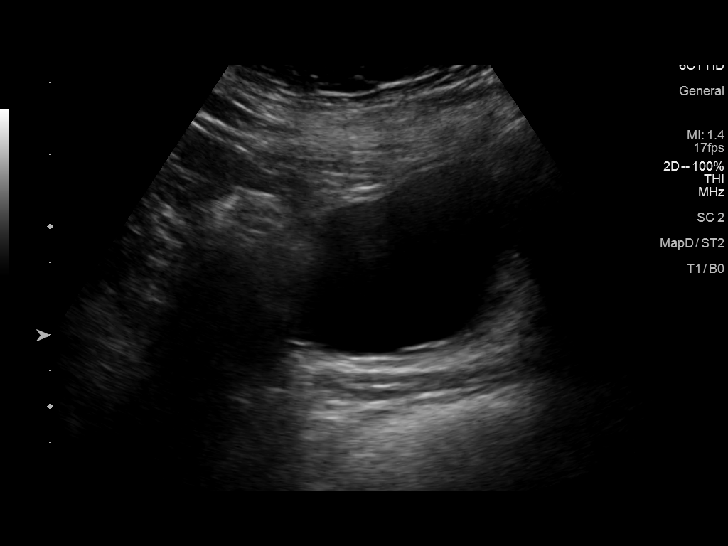
[im 24/27]
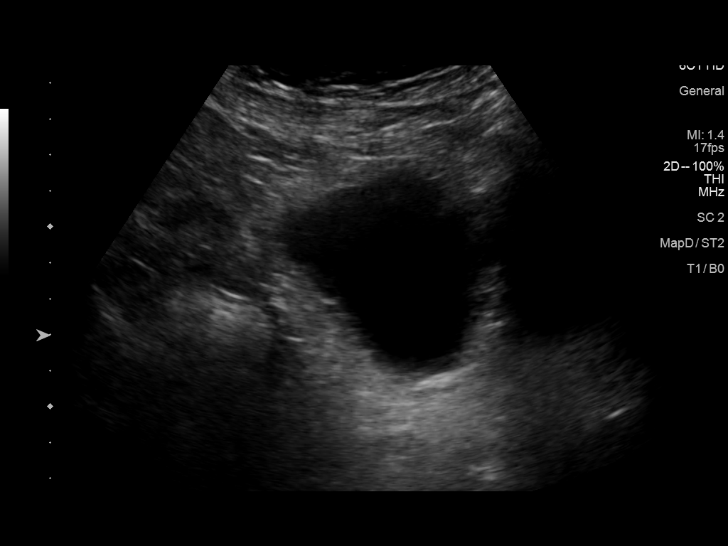
[im 27/27]
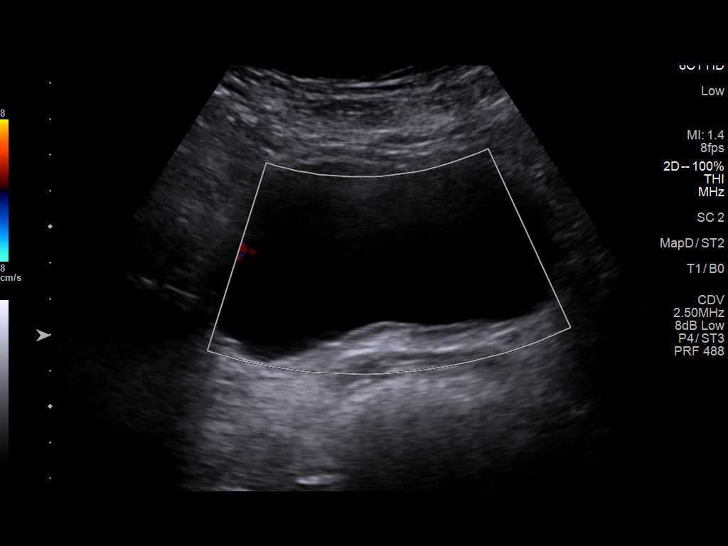

[14 of 25 positions shown; findings below may reference images not displayed]

FINDINGS: Right Kidney:

Renal measurements: 10.4 x 3.6 x 4.5 cm = volume: 88 mL. Slight
thinning of renal parenchyma but normal parenchymal echogenicity. No
hydronephrosis. No visualized stone or focal lesion.

Left Kidney:

Renal measurements: 10.6 x 4.9 x 4.2 cm = volume: 114 mL. Normal
parenchymal echogenicity. No hydronephrosis. No visualized stone or
focal lesion.

Bladder:

Appears normal for degree of bladder distention.

Other:

None.
IMPRESSION: 1. Slight thinning of right renal parenchyma.
2. Otherwise unremarkable sonographic appearance of the kidneys.

## 2022-11-12 IMAGING — CT CT CHEST LUNG CANCER SCREENING LOW DOSE W/O CM
1 series · 10 of 10 positions shown, 13 images · non-contrast
Comparison: Low-dose lung cancer screening CT chest dated
04/11/2020

CLINICAL DATA: 75-year-old female current smoker, with 58 pack-year
history of smoking, for follow-up lung cancer screening



[ct lung segmentation data · axial · 0.75mm/px · z∈[+590,+590]mm · 10 of 307 frames shown]
[frame 1/307  mediastinal]
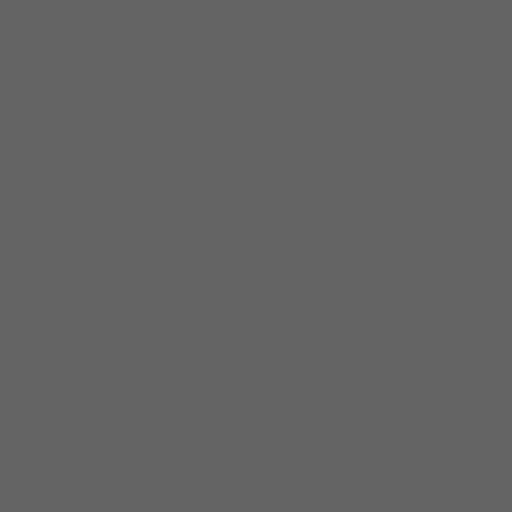
[frame 1/307  lung]
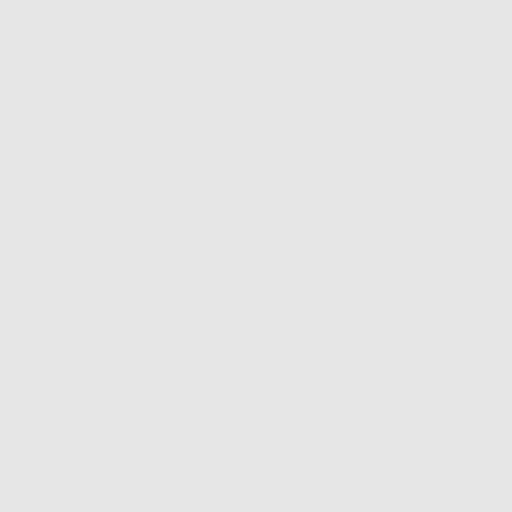
[frame 35/307  lung]
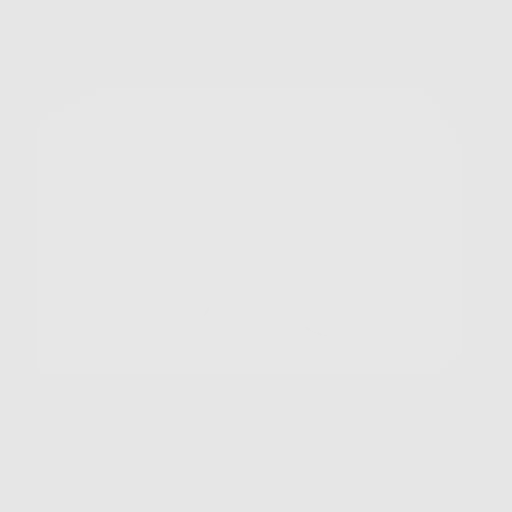
[frame 69/307  lung]
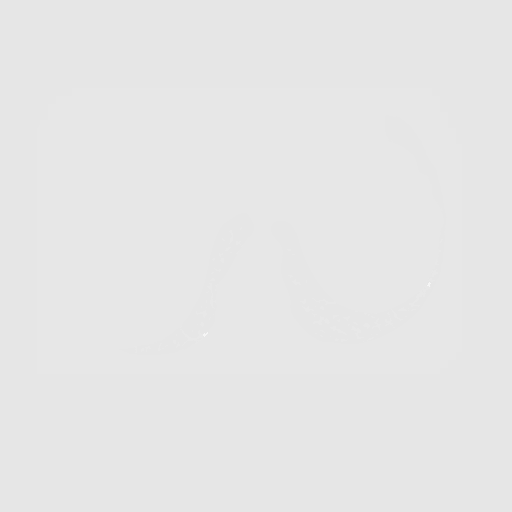
[frame 103/307  lung]
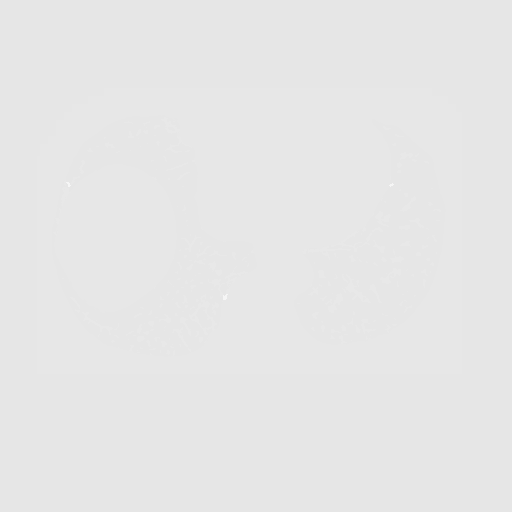
[frame 137/307  mediastinal]
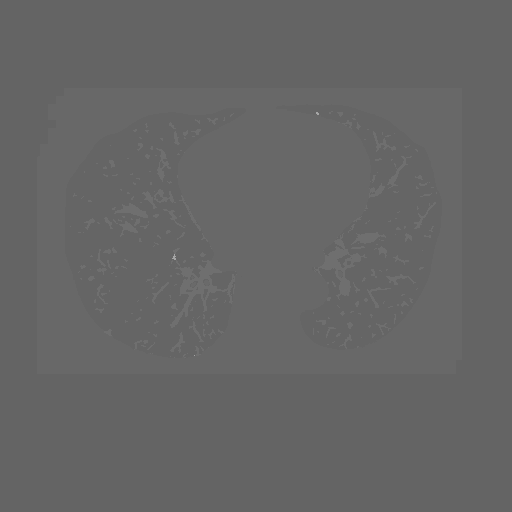
[frame 137/307  lung]
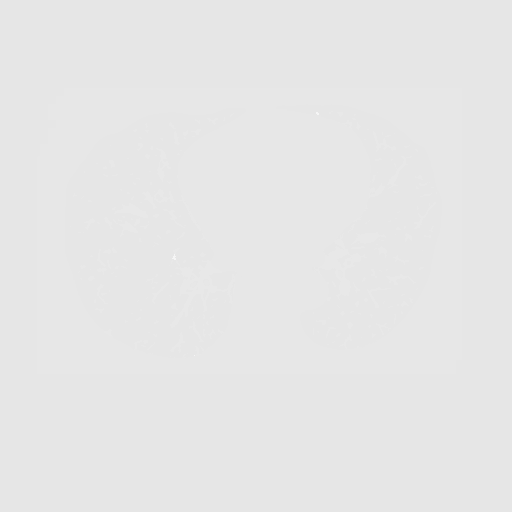
[frame 171/307  lung]
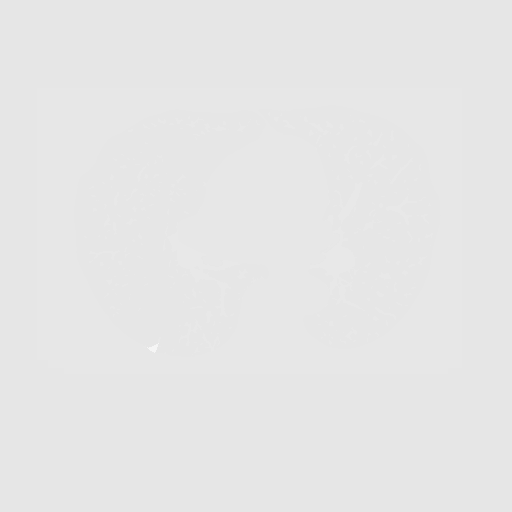
[frame 205/307  lung]
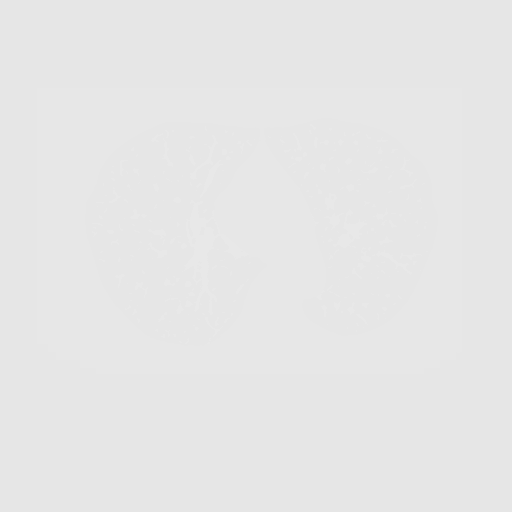
[frame 239/307  lung]
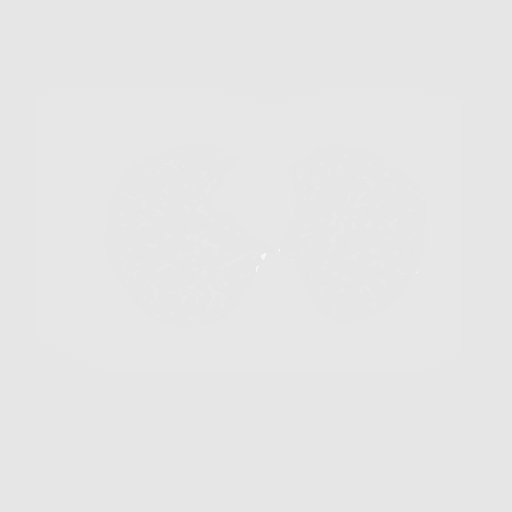
[frame 273/307  mediastinal]
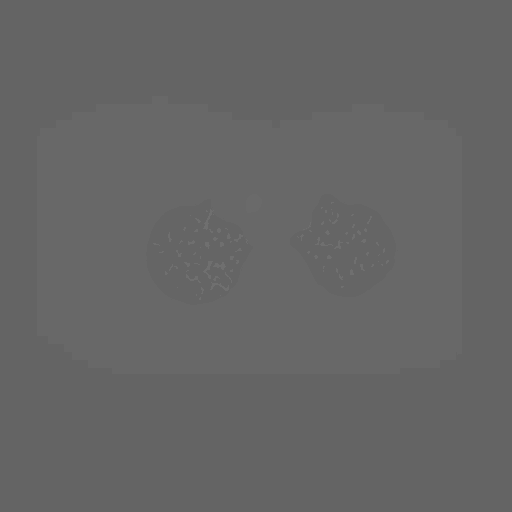
[frame 273/307  lung]
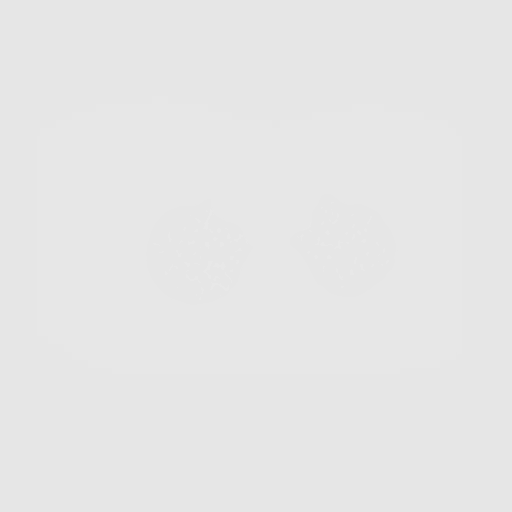
[frame 307/307  lung]
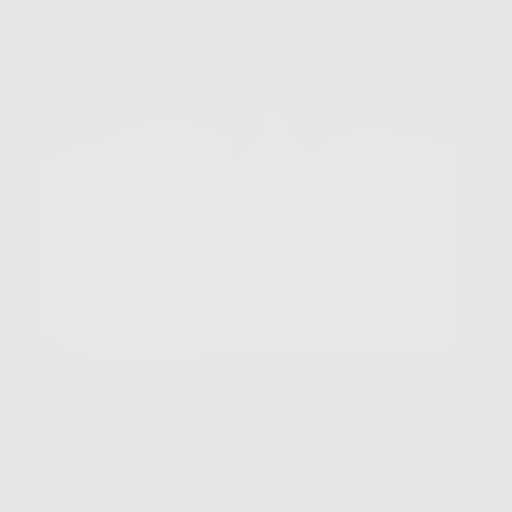

[10 of 10 positions shown; findings below may reference images not displayed]

FINDINGS: Cardiovascular: Heart is normal in size.  No pericardial effusion.

No evidence of thoracic aneurysm. Atherosclerotic calcifications of
the aortic arch.

Three vessel coronary atherosclerosis.

Mediastinum/Nodes: No suspicious mediastinal lymphadenopathy.

4.4 cm left paratracheal mass (series 2/image 9), likely reflecting
substernal goiter, grossly unchanged from the prior and previously
evaluated on thyroid ultrasound/biopsy. This has been evaluated on
previous imaging. (ref: [HOSPITAL]. [DATE]): 143-50).

Lungs/Pleura: Mild centrilobular emphysematous changes, upper lung
predominant.

No focal consolidation. Mild linear scarring in the left upper lobe
and right lower lobe.

Scattered bilateral pulmonary nodules, measuring up to 5.0 mm in the
right middle lobe along the minor fissure, unchanged.

No pleural effusion or pneumothorax.

Upper Abdomen: Visualized upper abdomen is notable for prior
cholecystectomy and vascular calcifications.

Musculoskeletal: Degenerative changes of the visualized
thoracolumbar spine.
IMPRESSION: Lung-RADS 2, benign appearance or behavior. Continue annual
screening with low-dose chest CT without contrast in 12 months.

Aortic Atherosclerosis (A7RNN-1TM.M) and Emphysema (A7RNN-H5Y.6).

## 2022-11-13 ENCOUNTER — Other Ambulatory Visit: Payer: Self-pay | Admitting: Family Medicine

## 2022-11-13 DIAGNOSIS — M109 Gout, unspecified: Secondary | ICD-10-CM

## 2022-11-13 MED ORDER — TRAMADOL HCL 50 MG PO TABS
50.0000 mg | ORAL_TABLET | Freq: Four times a day (QID) | ORAL | 0 refills | Status: AC | PRN
Start: 2022-11-13 — End: 2022-11-18

## 2022-11-14 ENCOUNTER — Telehealth: Payer: Self-pay | Admitting: Pharmacist

## 2022-11-14 NOTE — Patient Instructions (Signed)
Visit Information  Thank you for taking time to visit with me today. Please don't hesitate to contact me if I can be of assistance to you.   Following are the goals we discussed today:   Goals Addressed             This Visit's Progress    Obtain Supportive Resources   On track    Activities and task to complete in order to accomplish goals.   Keep all upcoming appointments discussed today Continue with compliance of taking medication prescribed by Doctor Implement healthy coping skills discussed to assist with management of symptoms Continue working with [facility] care team to assist with goals identified Continue to decrease in tobacco use Utilize resources to contact [facility] to inquire about healthcare options         Our next appointment is by telephone on 07/16 at 1 PM  Please call the care guide team at [phone] if you need to cancel or reschedule your appointment.   If you are experiencing a Mental Health or Behavioral Health Crisis or need someone to talk to, please call the Suicide and Crisis Lifeline: 988 call 911   Patient verbalizes understanding of instructions and care plan provided today and agrees to view in MyChart. Active MyChart status and patient understanding of how to access instructions and care plan via MyChart confirmed with patient.     Jenel Lucks, MSW, LCSW [facility] [place]  [facility] [email or web address] Phone [phone] 1:47 PM

## 2022-11-14 NOTE — Progress Notes (Signed)
Outreached patient to cancel upcoming appointment with [facility] Pharmacist. Patient verbalized understanding and denied any needs at this time.   Encouraged to keep next appointment with PCP on 6/28.   Catie Eppie Gibson, PharmD, BCACP, CPP [facility] [phone]

## 2022-11-27 ENCOUNTER — Ambulatory Visit: Payer: Medicare Other | Admitting: Cardiology

## 2022-11-29 ENCOUNTER — Ambulatory Visit: Payer: Medicare Other | Admitting: Internal Medicine

## 2022-12-02 ENCOUNTER — Inpatient Hospital Stay: Admission: RE | Admit: 2022-12-02 | Payer: Medicare Other | Source: Ambulatory Visit

## 2022-12-03 ENCOUNTER — Other Ambulatory Visit: Payer: Self-pay | Admitting: Pharmacist

## 2022-12-03 MED ORDER — OZEMPIC (0.25 OR 0.5 MG/DOSE) 2 MG/3ML ~~LOC~~ SOPN: 0.5000 mg | PEN_INJECTOR | SUBCUTANEOUS | 2 refills | Status: DC

## 2022-12-03 NOTE — Progress Notes (Signed)
Pharmacy Quality Measure Review  This patient is appearing on a report for being at risk of failing the adherence measure for diabetes medications this calendar year.   Medication: Ozempic 0.25/0.5 dose Last fill date: 5/29 for 28 day supply  Reports she did 4 weeks of 0.25 mg weekly dose and then increased to 0.5 mg weekly, however, appears the script is for 0.25 mg weekly but billed for 28 day supply (though 3 mL with 0.25 mg weekly would last 8 weeks).   As patient is taking 0.5 mg weekly, will update script. She notes she is about to be due for a refill, so will send updated script today.   Catie Eppie Gibson, PharmD, BCACP, CPP Clinical Pharmacist [facility] [phone]

## 2022-12-10 ENCOUNTER — Ambulatory Visit (HOSPITAL_BASED_OUTPATIENT_CLINIC_OR_DEPARTMENT_OTHER): Payer: Medicare Other | Admitting: Cardiovascular Disease

## 2022-12-16 ENCOUNTER — Encounter (HOSPITAL_BASED_OUTPATIENT_CLINIC_OR_DEPARTMENT_OTHER): Payer: Self-pay | Admitting: Emergency Medicine

## 2022-12-16 ENCOUNTER — Emergency Department (HOSPITAL_BASED_OUTPATIENT_CLINIC_OR_DEPARTMENT_OTHER): Payer: Medicare Other

## 2022-12-16 ENCOUNTER — Other Ambulatory Visit: Payer: Self-pay

## 2022-12-16 ENCOUNTER — Emergency Department (HOSPITAL_BASED_OUTPATIENT_CLINIC_OR_DEPARTMENT_OTHER)
Admission: EM | Admit: 2022-12-16 | Discharge: 2022-12-16 | Disposition: A | Discharge status: Home / Self Care | Payer: Medicare Other | Attending: Emergency Medicine | Admitting: Emergency Medicine

## 2022-12-16 ENCOUNTER — Emergency Department (HOSPITAL_BASED_OUTPATIENT_CLINIC_OR_DEPARTMENT_OTHER): Payer: Medicare Other | Admitting: Radiology

## 2022-12-16 DIAGNOSIS — M79672 Pain in left foot: Secondary | ICD-10-CM | POA: Diagnosis not present

## 2022-12-16 DIAGNOSIS — J984 Other disorders of lung: Secondary | ICD-10-CM | POA: Diagnosis not present

## 2022-12-16 DIAGNOSIS — M19072 Primary osteoarthritis, left ankle and foot: Secondary | ICD-10-CM | POA: Diagnosis not present

## 2022-12-16 DIAGNOSIS — M722 Plantar fascial fibromatosis: Secondary | ICD-10-CM

## 2022-12-16 DIAGNOSIS — Z7901 Long term (current) use of anticoagulants: Secondary | ICD-10-CM | POA: Insufficient documentation

## 2022-12-16 DIAGNOSIS — N189 Chronic kidney disease, unspecified: Secondary | ICD-10-CM | POA: Diagnosis not present

## 2022-12-16 DIAGNOSIS — M85872 Other specified disorders of bone density and structure, left ankle and foot: Secondary | ICD-10-CM | POA: Diagnosis not present

## 2022-12-16 DIAGNOSIS — R0602 Shortness of breath: Secondary | ICD-10-CM | POA: Diagnosis not present

## 2022-12-16 DIAGNOSIS — R918 Other nonspecific abnormal finding of lung field: Secondary | ICD-10-CM | POA: Diagnosis not present

## 2022-12-16 LAB — CBC WITH DIFFERENTIAL/PLATELET
Abs Immature Granulocytes: 0.07 10*3/uL (ref 0.00–0.07)
Basophils Absolute: 0.1 10*3/uL (ref 0.0–0.1)
Basophils Relative: 1 %
Eosinophils Absolute: 0.1 10*3/uL (ref 0.0–0.5)
Eosinophils Relative: 1 %
HCT: 37.4 % (ref 36.0–46.0)
Hemoglobin: 11.8 g/dL — ABNORMAL LOW (ref 12.0–15.0)
Immature Granulocytes: 1 %
Lymphocytes Relative: 15 %
Lymphs Abs: 1.3 10*3/uL (ref 0.7–4.0)
MCH: 25.7 pg — ABNORMAL LOW (ref 26.0–34.0)
MCHC: 31.6 g/dL (ref 30.0–36.0)
MCV: 81.5 fL (ref 80.0–100.0)
Monocytes Absolute: 0.5 10*3/uL (ref 0.1–1.0)
Monocytes Relative: 6 %
Neutro Abs: 6.3 10*3/uL (ref 1.7–7.7)
Neutrophils Relative %: 76 %
Platelets: 251 10*3/uL (ref 150–400)
RBC: 4.59 MIL/uL (ref 3.87–5.11)
RDW: 15.4 % (ref 11.5–15.5)
WBC: 8.3 10*3/uL (ref 4.0–10.5)
nRBC: 0 % (ref 0.0–0.2)

## 2022-12-16 LAB — BASIC METABOLIC PANEL
Anion gap: 11 (ref 5–15)
BUN: 30 mg/dL — ABNORMAL HIGH (ref 8–23)
CO2: 27 mmol/L (ref 22–32)
Calcium: 9.9 mg/dL (ref 8.9–10.3)
Chloride: 97 mmol/L — ABNORMAL LOW (ref 98–111)
Creatinine, Ser: 1.95 mg/dL — ABNORMAL HIGH (ref 0.44–1.00)
GFR, Estimated: 26 mL/min — ABNORMAL LOW (ref >60)
Glucose, Bld: 123 mg/dL — ABNORMAL HIGH (ref 70–99)
Potassium: 3.8 mmol/L (ref 3.5–5.1)
Sodium: 135 mmol/L (ref 135–145)

## 2022-12-16 LAB — SEDIMENTATION RATE: Sed Rate: 47 mm/hr — ABNORMAL HIGH (ref 0–22)

## 2022-12-16 LAB — C-REACTIVE PROTEIN: CRP: 4.1 mg/dL — ABNORMAL HIGH (ref <1.0)

## 2022-12-16 MED ORDER — LACTATED RINGERS IV BOLUS
1000.0000 mL | Freq: Once | INTRAVENOUS | Status: AC
  Administered 2022-12-16: 1000 mL via INTRAVENOUS

## 2022-12-16 NOTE — ED Notes (Addendum)
Pt hypotensive in triage, BP obtained 2 times in LT arm, once in RT arm

## 2022-12-16 NOTE — ED Notes (Signed)
Pt verbalized understanding of d/c instructions, meds, and followup care. Denies questions. VSS, no distress noted. Steady gait to exit with all belongings.  ?

## 2022-12-16 NOTE — ED Triage Notes (Signed)
Pt arrives pov, to triage in wheelchair c/o LT leg and foot pain x 2 weeks. Pt endorses difficulty ambulating. Denies injury. Took tramadol at 0800 with no relief

## 2022-12-16 NOTE — Discharge Instructions (Signed)
Please use non NSAID pain medication such as Tylenol for foot pain. Wear the post-op shoe to help with walking. Follow up with sports medicine clinic if desired for further workup.

## 2022-12-16 NOTE — ED Notes (Signed)
Report given to the next RN... 

## 2022-12-16 NOTE — ED Provider Notes (Signed)
[place] EMERGENCY DEPARTMENT AT [facility] Provider Note   CSN: [id number] Arrival date & time: 12/16/22  1118     History  Chief Complaint  Patient presents with   Leg Pain   Hypotension    [name] is a 77 y.o. female.  Patient is a 77 year old woman with past history significant for CKD presenting with left foot pain. Patient reports pain started two weeks ago and has been ongoing since then. She describes the pain as worst on her big toe and the sole of her foot. She has been mostly unable to walk since the pain began. Also reports some mild shortness of breath. Pain is limited to foot, no calf or upper leg pain or swelling. Denies fever, fatigue, chest pain, nausea, vomiting.    Leg Pain Associated symptoms: no fever        Home Medications Prior to Admission medications   Medication Sig Start Date End Date Taking? Authorizing Provider  acetaminophen (TYLENOL) 500 MG tablet Take 1,000 mg by mouth 3 (three) times daily as needed for pain.    [provider]  apixaban (ELIQUIS) 5 MG TABS tablet Take 1 tablet (5 mg total) by mouth 2 (two) times daily. 08/20/22   Dorothyann Peng, MD  BD PEN NEEDLE NANO U/F 32G X 4 MM MISC USE AS DIRECTED WITH INSULIN PENS 09/01/18   Dorothyann Peng, MD  betamethasone valerate ointment (VALISONE) 0.1 % Apply 1 Application topically 2 (two) times daily. prn Patient taking differently: Apply 1 Application topically daily as needed (Dry skin). 11/20/21   Dorothyann Peng, MD  Cholecalciferol (VITAMIN D) 2000 UNITS CAPS Take 2,000 Units by mouth daily.    [provider]  diclofenac Sodium (VOLTAREN) 1 % GEL Apply 4 g topically 4 (four) times daily. Patient taking differently: Apply 4 g topically 4 (four) times daily as needed (pain). 11/30/19   Melene Plan, DO  digoxin (LANOXIN) 0.125 MG tablet Take 1 tablet (125 mcg total) by mouth daily. Please keep your upcoming appointment for refills. 08/19/22   Chilton Si,  MD  enalapril (VASOTEC) 5 MG tablet Take 1 tablet (5 mg total) by mouth at bedtime. Please keep your upcoming appointment for refills. 08/19/22   Chilton Si, MD  ezetimibe (ZETIA) 10 MG tablet Take 1 tablet (10 mg total) by mouth daily. 08/20/22   Dorothyann Peng, MD  furosemide (LASIX) 40 MG tablet TAKE 1 TABLET DAILY AS NEEDED FOR EDEMA FOR FLUID OVERLOAD. PLEASE KEEP UPCOMING APPOINTMENT 10/25/22   Dorothyann Peng, MD  glucose blood (ONETOUCH VERIO) test strip USE AS DIRECTED TO CHECK BLOOD SUGARS 1 TIME PER DAY 08/20/22   Dorothyann Peng, MD  KLOR-CON M20 20 MEQ tablet TAKE 1 TABLET (20 MEQ TOTAL) BY MOUTH DAILY. WHEN TAKING FUROSEMIDE (LASIX). Patient not taking: Reported on 10/31/2022 03/13/17 12/29/24  Corky Crafts, MD  LIVALO 2 MG TABS TAKE 1 TABLET BY MOUTH EVERY DAY 10/25/22   Dorothyann Peng, MD  metoprolol succinate (TOPROL-XL) 50 MG 24 hr tablet Take 1 tablet (50 mg total) by mouth daily. Take with or immediately following a meal. 08/20/22 07/06/23  Dorothyann Peng, MD  Multiple Vitamin (MULITIVITAMIN WITH MINERALS) TABS Take 1 tablet by mouth daily. Centrum Silver    [provider]  nystatin cream (MYCOSTATIN) Apply 1 Application topically 2 (two) times daily as needed for dry skin (under breasts). 08/20/22   Dorothyann Peng, MD  pantoprazole (PROTONIX) 20 MG tablet TAKE 1 TABLET BY MOUTH DAILY AS  NEEDED FOR HEARTBURN. 08/20/22   Dorothyann Peng, MD  Semaglutide,0.25 or 0.5MG /DOS, (OZEMPIC, 0.25 OR 0.5 MG/DOSE,) 2 MG/3ML SOPN Inject 0.5 mg into the skin once a week. 12/03/22   Dorothyann Peng, MD  tiZANidine (ZANAFLEX) 4 MG tablet TAKE 1 TABLET (4 MG TOTAL) BY MOUTH DAILY AS NEEDED FOR BACK PAIN 01/09/22   Dorothyann Peng, MD  vitamin C (ASCORBIC ACID) 500 MG tablet Take 500 mg by mouth daily.    [provider]      Allergies    Codeine, Augmentin [amoxicillin-pot clavulanate], Coreg [carvedilol], and Warfarin and related    Review of Systems   Review of Systems   Constitutional:  Negative for chills and fever.  Respiratory:  Positive for shortness of breath.   Cardiovascular:  Negative for chest pain.  Gastrointestinal:  Negative for abdominal pain, nausea and vomiting.    Physical Exam Updated Vital Signs BP (!) 123/59   Pulse 73   Temp 97.9 F (36.6 C)   Resp 11   Ht 5\' 7"  (1.702 m)   Wt 89.4 kg   SpO2 99%   BMI 30.85 kg/m  Physical Exam Constitutional:      General: She is not in acute distress. Cardiovascular:     Rate and Rhythm: Normal rate and regular rhythm.     Heart sounds: Normal heart sounds.  Pulmonary:     Effort: Pulmonary effort is normal. No respiratory distress.  Abdominal:     General: There is no distension.     Palpations: Abdomen is soft.     Tenderness: There is no abdominal tenderness.  Feet:     Comments: Tender to palpation of left sole and great toe. Mild ankle edema. No redness or skin changes. Neurological:     Mental Status: She is alert.    ED Results / Procedures / Treatments   Labs (all labs ordered are listed, but only abnormal results are displayed) Labs Reviewed  CBC WITH DIFFERENTIAL/PLATELET - Abnormal; Notable for the following components:      Result Value   Hemoglobin 11.8 (*)    MCH 25.7 (*)    All other components within normal limits  BASIC METABOLIC PANEL - Abnormal; Notable for the following components:   Chloride 97 (*)    Glucose, Bld 123 (*)    BUN 30 (*)    Creatinine, Ser 1.95 (*)    GFR, Estimated 26 (*)    All other components within normal limits  SEDIMENTATION RATE - Abnormal; Notable for the following components:   Sed Rate 47 (*)    All other components within normal limits  C-REACTIVE PROTEIN    EKG None  Radiology DG Chest Portable 1 View  Result Date: 12/16/2022 CLINICAL DATA:  Shortness of breath EXAM: PORTABLE CHEST 1 VIEW COMPARISON:  Previous studies including CT chest done on 06/29/2021 FINDINGS: Transverse diameter of heart is increased. There  are no signs of pulmonary edema. There is increased density in the lateral aspect of left lower lung field. Right lateral CP angle is clear. There is no pneumothorax. There is extrinsic pressure over the left margin of trachea in the lower neck and superior mediastinum, possibly due to enlarged left lobe of thyroid. IMPRESSION: There are no signs of pulmonary edema. There is subtle increased density in the lateral aspect of left lower lung field which may suggest small focus of atelectasis/pneumonia and possibly minimal effusion. Possible enlarged left lobe of thyroid. Electronically Signed   By: Harlan Stains.D.  On: 12/16/2022 13:51   DG Foot Complete Left  Result Date: 12/16/2022 CLINICAL DATA:  Pain of the plantar foot and great toe EXAM: LEFT FOOT - COMPLETE 3+ VIEW COMPARISON:  08/03/2019 FINDINGS: Age related osteopenia. Regional vascular calcification. No evidence of fracture. No significant degenerative changes of the toes. Mild degenerative change in the midfoot. IMPRESSION: No acute or traumatic finding. Osteopenia. Vascular calcification. Mild degenerative change in the midfoot. Electronically Signed   By: Paulina Fusi M.D.   On: 12/16/2022 13:19    Procedures Procedures    Medications Ordered in ED Medications  lactated ringers bolus 1,000 mL (1,000 mLs Intravenous New Bag/Given 12/16/22 1247)    ED Course/ Medical Decision Making/ A&P                             Medical Decision Making Amount and/or Complexity of Data Reviewed Labs: ordered. Radiology: ordered.  Medical Decision Making:   [name] is a 77 y.o. female who presented to the ED today with foot pain detailed above.    Complete initial physical exam performed, notably the patient  was normotensive.    Reviewed and confirmed nursing documentation for past medical history, family history, social history.    Initial Assessment:   With the patient's presentation of foot pain, most likely diagnosis is  plantar fasciitis. Other diagnoses were considered including (but not limited to) fracture, gout, pseudogout, septic arthritis. These are considered less likely due to history of present illness and physical exam findings.    Initial Plan:   Screening labs including CBC and Metabolic panel to evaluate for infectious or metabolic etiology of disease.  CXR to evaluate for structural/infectious intrathoracic pathology.  EKG to evaluate for cardiac pathology Objective evaluation as below reviewed   Initial Study Results:   Laboratory  All laboratory results reviewed without evidence of clinically relevant pathology.    EKG EKG was reviewed independently. Rate, rhythm, axis, intervals all examined and without medically relevant abnormality. ST segments without concerns for elevations.    Radiology:  All images reviewed independently. Agree with radiology report at this time.   DG Chest Portable 1 View  Result Date: 12/16/2022 CLINICAL DATA:  Shortness of breath EXAM: PORTABLE CHEST 1 VIEW COMPARISON:  Previous studies including CT chest done on 06/29/2021 FINDINGS: Transverse diameter of heart is increased. There are no signs of pulmonary edema. There is increased density in the lateral aspect of left lower lung field. Right lateral CP angle is clear. There is no pneumothorax. There is extrinsic pressure over the left margin of trachea in the lower neck and superior mediastinum, possibly due to enlarged left lobe of thyroid. IMPRESSION: There are no signs of pulmonary edema. There is subtle increased density in the lateral aspect of left lower lung field which may suggest small focus of atelectasis/pneumonia and possibly minimal effusion. Possible enlarged left lobe of thyroid. Electronically Signed   By: Ernie Avena M.D.   On: 12/16/2022 13:51   DG Foot Complete Left  Result Date: 12/16/2022 CLINICAL DATA:  Pain of the plantar foot and great toe EXAM: LEFT FOOT - COMPLETE 3+ VIEW  COMPARISON:  08/03/2019 FINDINGS: Age related osteopenia. Regional vascular calcification. No evidence of fracture. No significant degenerative changes of the toes. Mild degenerative change in the midfoot. IMPRESSION: No acute or traumatic finding. Osteopenia. Vascular calcification. Mild degenerative change in the midfoot. Electronically Signed   By: Scherrie Bateman.D.  On: 12/16/2022 13:19    Reassessment and Plan:   Patient is a 77 year old woman with history of CKD presenting with left foot pain. On admission, patient was hypotensive. Blood pressures normalized spontaneously. On exam, patient had tenderness to palpation of plantar surface of foot. Labs were reassuring. Xray of the foot showed no acute fracture or traumatic finding. On reassessment, patient was resting comfortably in bed. Patient agreed with plan to discharge this time with oral analgesics and follow-up with sports medicine as needed.          Final Clinical Impression(s) / ED Diagnoses Final diagnoses:  None    Rx / DC Orders ED Discharge Orders     None         Monna Fam, MD 12/16/22 1448    Ernie Avena, MD 12/16/22 [phone]

## 2022-12-17 ENCOUNTER — Ambulatory Visit: Payer: Self-pay | Admitting: Licensed Clinical Social Worker

## 2022-12-19 NOTE — Patient Instructions (Signed)
Visit Information  Thank you for taking time to visit with me today. Please don't hesitate to contact me if I can be of assistance to you.   Following are the goals we discussed today:   Goals Addressed             This Visit's Progress    Obtain Supportive Resources   On track    Activities and task to complete in order to accomplish goals.   Keep all upcoming appointments discussed today Continue with compliance of taking medication prescribed by Doctor Implement healthy coping skills discussed to assist with management of symptoms Continue working with [facility] care team to assist with goals identified Continue to decrease in tobacco use Utilize resources to contact [facility] to inquire about healthcare options         Our next appointment is by telephone on 7/23 at 1 pm  Please call the care guide team at [phone] if you need to cancel or reschedule your appointment.   If you are experiencing a Mental Health or Behavioral Health Crisis or need someone to talk to, please call the Suicide and Crisis Lifeline: 988 call 911   Patient verbalizes understanding of instructions and care plan provided today and agrees to view in MyChart. Active MyChart status and patient understanding of how to access instructions and care plan via MyChart confirmed with patient.     Jenel Lucks, MSW, LCSW [facility] Care Management [place]  [facility] [email or web address] Phone [phone] 12:32 AM

## 2022-12-19 NOTE — Patient Outreach (Signed)
  Care Coordination   Follow Up Visit Note   12/17/2022 Name: [name] MRN: [id number] DOB: 1945-07-28  [name] is a 77 y.o. year old female who sees Dorothyann Peng, MD for primary care. I spoke with  [name] by phone today.  What matters to the patients health and wellness today?  Symptom Management    Goals Addressed             This Visit's Progress    Obtain Supportive Resources   On track    Activities and task to complete in order to accomplish goals.   Keep all upcoming appointments discussed today Continue with compliance of taking medication prescribed by Doctor Implement healthy coping skills discussed to assist with management of symptoms Continue working with [facility] care team to assist with goals identified Continue to decrease in tobacco use Utilize resources to contact [facility] to inquire about healthcare options         SDOH assessments and interventions completed:  No     Care Coordination Interventions:  Yes, provided  Interventions Today    Flowsheet Row Most Recent Value  Chronic Disease   Chronic disease during today's visit Atrial Fibrillation (AFib), Diabetes, Chronic Kidney Disease/End Stage Renal Disease (ESRD), Other  [Depression]  General Interventions   General Interventions Discussed/Reviewed General Interventions Reviewed, Doctor Visits, Communication with  Doctor Visits Discussed/Reviewed Doctor Visits Reviewed  Communication with PCP/Specialists  [Pt would like a referral in [place], stating that [place] is too far to travel with limited support]  Mental Health Interventions   Mental Health Discussed/Reviewed Mental Health Reviewed, Coping Strategies, Depression  Nutrition Interventions   Nutrition Discussed/Reviewed Nutrition Reviewed  Pharmacy Interventions   Pharmacy Dicussed/Reviewed Pharmacy Topics Reviewed, Medication Adherence       Follow up plan: Follow up call scheduled for 2-4 weeks    Encounter  Outcome:  Pt. Visit Completed   Jenel Lucks, MSW, LCSW [facility] Care Management [facility]  [facility] [email or web address] Phone [phone] 12:31 AM

## 2022-12-24 ENCOUNTER — Inpatient Hospital Stay: Admission: RE | Admit: 2022-12-24 | Payer: Medicare Other | Source: Ambulatory Visit

## 2022-12-24 ENCOUNTER — Ambulatory Visit: Payer: Self-pay | Admitting: Licensed Clinical Social Worker

## 2022-12-26 ENCOUNTER — Encounter: Payer: Self-pay | Admitting: Licensed Clinical Social Worker

## 2022-12-26 NOTE — Patient Outreach (Signed)
  Care Coordination   Follow Up Visit Note   12/24/2022 Name: [name] MRN: [id number] DOB: 02/05/1946  [name] is a 77 y.o. year old female who sees Dorothyann Peng, MD for primary care. I spoke with  [name] by phone today.  What matters to the patients health and wellness today?  Pain management    Goals Addressed             This Visit's Progress    Obtain Supportive Resources   On track    Activities and task to complete in order to accomplish goals.   Keep all upcoming appointments discussed today. Contact PCP about ongoing pain Continue with compliance of taking medication prescribed by Doctor Implement healthy coping skills discussed to assist with management of symptoms Continue working with [facility] care team to assist with goals identified Continue to decrease in tobacco use Utilize resources to contact [facility] to inquire about healthcare options         SDOH assessments and interventions completed:  No     Care Coordination Interventions:  Yes, provided  Interventions Today    Flowsheet Row Most Recent Value  Chronic Disease   Chronic disease during today's visit Atrial Fibrillation (AFib), Hypertension (HTN), Diabetes, Chronic Kidney Disease/End Stage Renal Disease (ESRD), Other  [Depression]  General Interventions   General Interventions Discussed/Reviewed General Interventions Reviewed, Doctor Visits  [Pt endorses difficulty managing ongoing pain. States she has followed recommendations to utilize boot,  however, it increases pain. Patient reports losing 8 lbs in a week]  Doctor Visits Discussed/Reviewed Doctor Visits Reviewed  Mental Health Interventions   Mental Health Discussed/Reviewed Mental Health Reviewed, Coping Strategies, Depression  [Strategies discussed to assist with depression and anxiety symptoms triggered by chronic pain]  Nutrition Interventions   Nutrition Discussed/Reviewed Nutrition Reviewed  Pharmacy Interventions    Pharmacy Dicussed/Reviewed Pharmacy Topics Reviewed, Medication Adherence  Safety Interventions   Safety Discussed/Reviewed Safety Reviewed, Fall Risk  [Pt has phillips lifeline to use in case of an emergency]       Follow up plan: Follow up call scheduled for 1 week    Encounter Outcome:  Pt. Visit Completed   Jenel Lucks, MSW, LCSW [facility] Care Management [facility] Health  [facility] [email or web address] Phone [phone] 8:44 AM

## 2022-12-26 NOTE — Patient Instructions (Signed)
Visit Information  Thank you for taking time to visit with me today. Please don't hesitate to contact me if I can be of assistance to you.   Following are the goals we discussed today:   Goals Addressed             This Visit's Progress    Obtain Supportive Resources   On track    Activities and task to complete in order to accomplish goals.   Keep all upcoming appointments discussed today. Contact PCP about ongoing pain Continue with compliance of taking medication prescribed by Doctor Implement healthy coping skills discussed to assist with management of symptoms Continue working with [facility] care team to assist with goals identified Continue to decrease in tobacco use Utilize resources to contact [facility] to inquire about healthcare options         Please call the care guide team at [phone] if you need to cancel or reschedule your appointment.   If you are experiencing a Mental Health or Behavioral Health Crisis or need someone to talk to, please call the Suicide and Crisis Lifeline: 988 call 911   Patient verbalizes understanding of instructions and care plan provided today and agrees to view in MyChart. Active MyChart status and patient understanding of how to access instructions and care plan via MyChart confirmed with patient.     Jenel Lucks, MSW, LCSW [facility] Care Management [place]  [facility] [email or web address] Phone [phone] 8:44 AM

## 2022-12-27 ENCOUNTER — Telehealth: Payer: Self-pay

## 2022-12-27 NOTE — Telephone Encounter (Signed)
Called to check on patient. She reports still being in great amount of pain. Patient given [place], Dr. Roda Shutters & [facility] contact information. She reports she will give her insurance a call to get uber rides secured for her visits. Patient aware to give the office a call once transportation is confirmed to schedule an follow up appointment here at the office.

## 2022-12-27 NOTE — Patient Outreach (Signed)
  Care Coordination   Collaboration  Visit Note   12/26/2022 Name: [name] MRN: [id number] DOB: [birth year]  [name] is a 77 y.o. year old female who sees Dorothyann Peng, MD for primary care. I  engaged with PCP office  What matters to the patients health and wellness today?  Pt was not engaged today    SDOH assessments and interventions completed:  No     Care Coordination Interventions:  Yes, provided   Follow up plan:  Requesting advisement of pcp    Encounter Outcome:  Pt. Visit Completed   Jenel Lucks, MSW, LCSW [facility] Care Management [facility]  [facility] [email or web address] Phone [phone] 6:26 AM

## 2022-12-31 ENCOUNTER — Other Ambulatory Visit: Payer: Self-pay

## 2022-12-31 ENCOUNTER — Ambulatory Visit: Payer: Self-pay

## 2022-12-31 ENCOUNTER — Encounter: Payer: Self-pay | Admitting: Licensed Clinical Social Worker

## 2022-12-31 DIAGNOSIS — I480 Paroxysmal atrial fibrillation: Secondary | ICD-10-CM

## 2022-12-31 NOTE — Patient Outreach (Signed)
  Care Coordination   Follow Up Visit Note   12/31/2022 Name: [name] MRN: [id number] DOB: February 12, 1946  [name] is a 77 y.o. year old female who sees Dorothyann Peng, MD for primary care. I spoke with  [name] by phone today.  What matters to the patients health and wellness today?  Patient would like to have her left foot pain evaluated and treated. She will call her PCP to reschedule her diabetes check up.     Goals Addressed             This Visit's Progress    To have my left foot pain evaluated and treated       Care Coordination Interventions: Evaluation of current treatment plan related to left plantar fasciitis and patient's adherence to plan as established by provider Determined patient is experiencing pain to her left midfoot, she was diagnosed with plantar fasciitis during a recent ED visit  Discussed with patient PCP provider instructed her to call Dr. Roda Shutters with [facility] to schedule a follow up visit for evaluation and treatment of this condition, she plans to call in the future after her diabetes follow up with Dr. Allyne Gee Discussed importance of adherence to all scheduled medical appointments Discussed use of relaxation techniques and/or diversional activities to assist with pain reduction (distraction, imagery, relaxation, massage, acupressure, TENS, heat, and cold application Reviewed with patient prescribed pharmacological and nonpharmacological pain relief strategies Assessed social determinant of health barriers    Interventions Today    Flowsheet Row Most Recent Value  Chronic Disease   Chronic disease during today's visit Other, Diabetes  [plantar fasciitis]  General Interventions   General Interventions Discussed/Reviewed General Interventions Discussed, General Interventions Reviewed, Doctor Visits  Doctor Visits Discussed/Reviewed PCP, Doctor Visits Reviewed, Doctor Visits Discussed, Specialist  Education Interventions   Education  Provided Provided Education  Provided Verbal Education On When to see the doctor, Medication, Other  [plantar fasciitis]  Pharmacy Interventions   Pharmacy Dicussed/Reviewed Pharmacy Topics Discussed, Pharmacy Topics Reviewed          SDOH assessments and interventions completed:  No     Care Coordination Interventions:  Yes, provided   Follow up plan: Follow up call scheduled for 01/28/23 @09 :30 AM    Encounter Outcome:  Pt. Visit Completed

## 2022-12-31 NOTE — Patient Instructions (Signed)
Visit Information  Thank you for taking time to visit with me today. Please don't hesitate to contact me if I can be of assistance to you.   Following are the goals we discussed today:   Goals Addressed             This Visit's Progress    To have my left foot pain evaluated and treated       Care Coordination Interventions: Evaluation of current treatment plan related to left plantar fasciitis and patient's adherence to plan as established by provider Determined patient is experiencing pain to her left midfoot, she was diagnosed with plantar fasciitis during a recent ED visit  Discussed with patient PCP provider instructed her to call Dr. Roda Shutters with [facility] to schedule a follow up visit for evaluation and treatment of this condition, she plans to call in the future after her diabetes follow up with Dr. Allyne Gee Discussed importance of adherence to all scheduled medical appointments Discussed use of relaxation techniques and/or diversional activities to assist with pain reduction (distraction, imagery, relaxation, massage, acupressure, TENS, heat, and cold application Reviewed with patient prescribed pharmacological and nonpharmacological pain relief strategies Assessed social determinant of health barriers        Our next appointment is by telephone on 01/28/23 at 09:30 AM  Please call the care guide team at [phone] if you need to cancel or reschedule your appointment.   If you are experiencing a Mental Health or Behavioral Health Crisis or need someone to talk to, please call 1-800-273-TALK (toll free, 24 hour hotline)  Patient verbalizes understanding of instructions and care plan provided today and agrees to view in MyChart. Active MyChart status and patient understanding of how to access instructions and care plan via MyChart confirmed with patient.     Delsa Sale, RN, BSN, CCM Care Management Coordinator [facility] Care Management Direct Phone: [phone]

## 2023-01-01 ENCOUNTER — Encounter: Payer: Self-pay | Admitting: Licensed Clinical Social Worker

## 2023-01-01 NOTE — Patient Outreach (Signed)
  Care Coordination   Collaboration  Visit Note   12/31/2022 Name: [name] MRN: [id number] DOB: 1945/07/15  [name] is a 77 y.o. year old female who sees Dorothyann Peng, MD for primary care. I  engaged with nurse at pcp office  What matters to the patients health and wellness today?  Pt was not engaged during this encounter     SDOH assessments and interventions completed:  No     Care Coordination Interventions:  Yes, provided  Interventions Today    Flowsheet Row Most Recent Value  Chronic Disease   Chronic disease during today's visit Atrial Fibrillation (AFib), Diabetes, Hypertension (HTN), Chronic Kidney Disease/End Stage Renal Disease (ESRD)  General Interventions   General Interventions Discussed/Reviewed Communication with  Communication with RN  [employer] collaborated with nurse at [employer]. They have spoken to pt about symptoms and will schedule appt with PCP. Transportation barriers identified. LCSW will complete referral for assistance]       Follow up plan: Follow up call scheduled for 1-2 weeks    Encounter Outcome:  Pt. Visit Completed   Jenel Lucks, MSW, LCSW [facility] Care Management [facility] Health  [facility] [email or web address] Phone [phone] 11:04 AM

## 2023-01-02 NOTE — Patient Outreach (Signed)
  Care Coordination   Collaboration  Visit Note   01/01/2023 Name: [name] MRN: [id number] DOB: Sep 30, 1945  [name] is a 77 y.o. year old female who sees Dorothyann Peng, MD for primary care. I  engaged with PCP office and care guide regarding referral for transportation assistance  What matters to the patients health and wellness today?  Pt was not engaged during this encounter. LCSW has left message for pt to f/up with LCSW or PCP office regarding availability     SDOH assessments and interventions completed:  No     Care Coordination Interventions:  Yes, provided   Follow up plan: Follow up call scheduled for within one week    Encounter Outcome:  Pt. Visit Completed   Jenel Lucks, MSW, LCSW [facility] Care Management [facility] Health  [facility] [email or web address] Phone [phone] 3:21 PM

## 2023-01-09 NOTE — Progress Notes (Deleted)
  Electrophysiology Office Note:   Date:  01/09/2023  ID:  [name], DOB 30-Dec-1945, MRN [id number]  Primary Cardiologist: Chilton Si, MD Electrophysiologist: Will Jorja Loa, MD  {Click to update primary MD,subspecialty MD or APP then REFRESH:1}    History of Present Illness:   [name] is a 77 y.o. female with h/o *** seen today for {VISITTYPE:28148}  Review of systems complete and found to be negative unless listed in HPI.   EP Information / Studies Reviewed:    {EKGtoday:28818}       ***  Risk Assessment/Calculations:   {Does this patient have ATRIAL FIBRILLATION?[phone]} No BP recorded.  {Refresh Note OR Click here to enter BP  :1}***        Physical Exam:   VS:  There were no vitals taken for this visit.   Wt Readings from Last 3 Encounters:  12/16/22 197 lb (89.4 kg)  07/29/22 212 lb 3.2 oz (96.3 kg)  06/13/22 207 lb (93.9 kg)     GEN: Well nourished, well developed in no acute distress NECK: No JVD; No carotid bruits CARDIAC: {EPRHYTHM:28826}, no murmurs, rubs, gallops RESPIRATORY:  Clear to auscultation without rales, wheezing or rhonchi  ABDOMEN: Soft, non-tender, non-distended EXTREMITIES:  No edema; No deformity   ASSESSMENT AND PLAN:    Paroxysmal atrial fibrillation  Atrial flutter  {Click here to Review PMH, Prob List, Meds, Allergies, SHx, FHx  :1}   Follow up with {NWGNF:62130} {EPFOLLOW QM:57846}  Signed, Graciella Freer, PA-C

## 2023-01-10 ENCOUNTER — Encounter: Payer: Self-pay | Admitting: Student

## 2023-01-10 ENCOUNTER — Ambulatory Visit: Payer: Medicare Other | Admitting: Student

## 2023-01-22 NOTE — Progress Notes (Signed)
I,Victoria T Deloria Lair, CMA,acting as a Neurosurgeon for Gwynneth Aliment, MD.,have documented all relevant documentation on the behalf of Gwynneth Aliment, MD,as directed by  Gwynneth Aliment, MD while in the presence of Gwynneth Aliment, MD.  Subjective:  Patient ID: [name] , female    DOB: [birth year] , 77 y.o.   MRN: [id number]  Chief Complaint  Patient presents with   Diabetes   Hypertension   Hyperlipidemia   Left leg Pain     HPI  The patient is here for a follow-up on her diabetes and blood pressure. She reports compliance with meds. She denies headaches, chest pain and shortness of breath. She admits she does not do a lot of moving around.  Today, she c/o left leg pain. She admits this has been an ongoing issue for a while. She has tried both Tylenol & Tramadol with minimal relief. She denies recent fall. She is not sure what triggered her sx. She has not been evaluated by Ortho recently and would like referral.      Diabetes She presents for her follow-up diabetic visit. She has type 2 diabetes mellitus. Her disease course has been stable. There are no hypoglycemic associated symptoms. Pertinent negatives for diabetes include no blurred vision. There are no hypoglycemic complications. Diabetic complications include nephropathy. Risk factors for coronary artery disease include diabetes mellitus, dyslipidemia, hypertension, obesity, post-menopausal and sedentary lifestyle. Meal planning includes avoidance of concentrated sweets. She participates in exercise intermittently. An ACE inhibitor/angiotensin II receptor blocker is being taken. Eye exam is current.  Hypertension This is a chronic problem. The current episode started more than 1 year ago. The problem has been gradually improving since onset. The problem is controlled. Pertinent negatives include no blurred vision. Risk factors for coronary artery disease include diabetes mellitus, dyslipidemia, obesity, post-menopausal state and  sedentary lifestyle. The current treatment provides moderate improvement. Compliance problems include exercise.      Past Medical History:  Diagnosis Date   Arthritis    "left knee" (03/12/2013)   Atrial fibrillation (HCC)    Cardioverted to NSR 08/22/2009   Atrial tachycardia 09/30/2019   Cardiomyopathy    CHF (congestive heart failure) (HCC)    Chronic combined systolic and diastolic heart failure (HCC) 04/09/2021   CKD (chronic kidney disease)    Depression    Dizziness    DM2 (diabetes mellitus, type 2) (HCC)    fasting 80-100 (03/12/2013)   Drug therapy 1/11   coumadin   Ejection fraction < 50%    20-25%, echo.06/2009, /  EF 30-35%, diffuse hypokinesis, echo, Oct 16, 2010   GERD (gastroesophageal reflux disease)    Gout    "left knee" (03/12/2013)   History of bronchitis    History of emphysema (HCC)    HLD (hyperlipidemia)    Hyperkalemia    Hypertension    LFT elevation    06/2009   Mitral regurgitation    mild - echo - 1/11 /  Mild, echo, May15, 2012   Overweight(278.02)    Pneumonia    "@ least twice" (03/12/2013)   Thyroid dysfunction    thyromegally,diffuse,nodule LLL,06/2009   Tobacco abuse    Tricuspid regurgitation    moderate - echo - 1/11     Family History  Problem Relation Age of Onset   Stroke Mother    Prostate cancer Father    Hypertension Sister    Stroke Maternal Grandfather    Stroke Paternal Grandmother    Heart attack Son  Hypertension Son        X2   Colon cancer Other    Heart disease Other    Breast cancer Neg Hx      Current Outpatient Medications:    acetaminophen (TYLENOL) 500 MG tablet, Take 1,000 mg by mouth 3 (three) times daily as needed for pain., Disp: , Rfl:    apixaban (ELIQUIS) 5 MG TABS tablet, Take 1 tablet (5 mg total) by mouth 2 (two) times daily., Disp: 180 tablet, Rfl: 1   BD PEN NEEDLE NANO U/F 32G X 4 MM MISC, USE AS DIRECTED WITH INSULIN PENS, Disp: 100 each, Rfl: 11   betamethasone valerate ointment  (VALISONE) 0.1 %, Apply 1 Application topically 2 (two) times daily. prn (Patient taking differently: Apply 1 Application topically daily as needed (Dry skin).), Disp: 30 g, Rfl: 0   Cholecalciferol (VITAMIN D) 2000 UNITS CAPS, Take 2,000 Units by mouth daily., Disp: , Rfl:    diclofenac Sodium (VOLTAREN) 1 % GEL, Apply 4 g topically 4 (four) times daily. (Patient taking differently: Apply 4 g topically 4 (four) times daily as needed (pain).), Disp: 100 g, Rfl: 0   digoxin (LANOXIN) 0.125 MG tablet, Take 1 tablet (125 mcg total) by mouth daily. Please keep your upcoming appointment for refills., Disp: 90 tablet, Rfl: 1   enalapril (VASOTEC) 5 MG tablet, Take 1 tablet (5 mg total) by mouth at bedtime. Please keep your upcoming appointment for refills., Disp: 90 tablet, Rfl: 1   ezetimibe (ZETIA) 10 MG tablet, Take 1 tablet (10 mg total) by mouth daily., Disp: 90 tablet, Rfl: 1   furosemide (LASIX) 40 MG tablet, TAKE 1 TABLET DAILY AS NEEDED FOR EDEMA FOR FLUID OVERLOAD. PLEASE KEEP UPCOMING APPOINTMENT, Disp: 90 tablet, Rfl: 0   glucose blood (ONETOUCH VERIO) test strip, USE AS DIRECTED TO CHECK BLOOD SUGARS 1 TIME PER DAY, Disp: 50 strip, Rfl: 7   LIVALO 2 MG TABS, TAKE 1 TABLET BY MOUTH EVERY DAY, Disp: 90 tablet, Rfl: 2   metoprolol succinate (TOPROL-XL) 50 MG 24 hr tablet, Take 1 tablet (50 mg total) by mouth daily. Take with or immediately following a meal., Disp: 90 tablet, Rfl: 3   Multiple Vitamin (MULITIVITAMIN WITH MINERALS) TABS, Take 1 tablet by mouth daily. Centrum Silver, Disp: , Rfl:    nystatin cream (MYCOSTATIN), Apply 1 Application topically 2 (two) times daily as needed for dry skin (under breasts)., Disp: 30 g, Rfl: 1   pantoprazole (PROTONIX) 20 MG tablet, TAKE 1 TABLET BY MOUTH DAILY AS NEEDED FOR HEARTBURN., Disp: 90 tablet, Rfl: 1   Semaglutide,0.25 or 0.5MG /DOS, (OZEMPIC, 0.25 OR 0.5 MG/DOSE,) 2 MG/3ML SOPN, Inject 0.5 mg into the skin once a week., Disp: 3 mL, Rfl: 2    tiZANidine (ZANAFLEX) 4 MG tablet, TAKE 1 TABLET (4 MG TOTAL) BY MOUTH DAILY AS NEEDED FOR BACK PAIN, Disp: 30 tablet, Rfl: 1   vitamin C (ASCORBIC ACID) 500 MG tablet, Take 500 mg by mouth daily., Disp: , Rfl:    KLOR-CON M20 20 MEQ tablet, TAKE 1 TABLET (20 MEQ TOTAL) BY MOUTH DAILY. WHEN TAKING FUROSEMIDE (LASIX). (Patient not taking: Reported on 10/31/2022), Disp: 90 tablet, Rfl: 2   Allergies  Allergen Reactions   Codeine Hives and Nausea And Vomiting   Augmentin [Amoxicillin-Pot Clavulanate] Diarrhea   Coreg [Carvedilol] Other (See Comments)    No energy   Warfarin And Related Other (See Comments)    Severe rectal bleeding     Review of Systems  Constitutional: Negative.   Eyes:  Negative for blurred vision.  Respiratory: Negative.    Cardiovascular: Negative.   Gastrointestinal: Negative.   Neurological: Negative.   Psychiatric/Behavioral: Negative.       Today's Vitals   01/23/23 1035  BP: 90/62  Pulse: 76  SpO2: 98%  Weight: 183 lb 12.8 oz (83.4 kg)  Height: 5\' 7"  (1.702 m)   Body mass index is 28.79 kg/m.  Wt Readings from Last 3 Encounters:  01/23/23 183 lb 12.8 oz (83.4 kg)  12/16/22 197 lb (89.4 kg)  07/29/22 212 lb 3.2 oz (96.3 kg)     Objective:  Physical Exam Vitals and nursing note reviewed.  Constitutional:      Appearance: Normal appearance. She is obese.  HENT:     Head: Normocephalic and atraumatic.  Eyes:     Extraocular Movements: Extraocular movements intact.  Cardiovascular:     Rate and Rhythm: Normal rate and regular rhythm.     Heart sounds: Normal heart sounds.  Pulmonary:     Effort: Pulmonary effort is normal.     Breath sounds: Normal breath sounds.  Musculoskeletal:     Cervical back: Normal range of motion.     Right lower leg: Edema present.     Left lower leg: Edema present.     Comments: Ambulatory with cane  Skin:    General: Skin is warm.  Neurological:     General: No focal deficit present.     Mental Status: She  is alert.  Psychiatric:        Mood and Affect: Mood normal.        Behavior: Behavior normal.         Assessment And Plan:  Type 2 diabetes mellitus with stage 3b chronic kidney disease, with long-term current use of insulin (HCC) Assessment & Plan: Chronic, I will check labs as below. She is now on weekly semaglutide.   Orders: -     CMP14+EGFR -     Lipid panel -     Hemoglobin A1c -     PTH, intact and calcium -     Phosphorus -     Protein electrophoresis, serum  Hypertensive heart and renal disease with renal failure, stage 1 through stage 4 or unspecified chronic kidney disease, with heart failure (HCC) Assessment & Plan: Chronic, she has soft blood pressure today, denies dizziness. Encouraged to stay well hydrated. Advised to add a little salt to her food TODAY.   Orders: -     CMP14+EGFR -     Lipid panel  Chronic combined systolic and diastolic heart failure (HCC) Assessment & Plan: Chronic, currently on metoprolol XL, enalapril, digoxin and furosemide. Importance of medication and dietary compliance was stressed to the patient.    Pure hypercholesterolemia Assessment & Plan: Chronic, LDL goal is less than 70.  She will continue with Livalo 2mg  weekly.  She is encouraged to avoid fried foods and to incorporate more chair exercises into her daily routine.   Orders: -     CMP14+EGFR -     Lipid panel  Secondary hyperparathyroidism of renal origin [facility]) Assessment & Plan: Chronic, PTH last checked Feb 2024. She has CKD stage 3b.    Paroxysmal atrial fibrillation (HCC) Assessment & Plan: Chronic, rate controlled with metoprolol XL 50mg  daily and anticoagulated with Eliquis.    Secondary hypercoagulable state [facility]) Assessment & Plan: She is currently on Eliquis due to underlying PAF.    Plantar fasciitis of left foot  Assessment & Plan: I will refer her to Podiatry for further evaluation. She was given exercises to perform prior to getting out of bed in  the mornings and while seated.   Orders: -     Ambulatory referral to Podiatry  Other emphysema [facility]) Assessment & Plan: Emphysematous changes seen on Jan 2023 LDCT.  Will consider Pulmonary evaluation.    Unintentional weight loss Assessment & Plan: She has lost 14lbs since July 2024 and 29 lbs since February.  She is on Ozempic; however, I do not think her weight loss is due to this medication solely. Thyroid function was normal in Feb 2024.   Orders: -     Prealbumin  Overweight with body mass index (BMI) of 28 to 28.9 in adult  Tobacco use Assessment & Plan: Importance of tobacco cessation was discussed with the patient. She is due for LDCT. However, she does have cardiac CT scheduled Sept 2024. I will be sure to review these results.       Return if symptoms worsen or fail to improve.  Patient was given opportunity to ask questions. Patient verbalized understanding of the plan and was able to repeat key elements of the plan. All questions were answered to their satisfaction.   I, Gwynneth Aliment, MD, have reviewed all documentation for this visit. The documentation on 01/23/23 for the exam, diagnosis, procedures, and orders are all accurate and complete.   IF YOU HAVE BEEN REFERRED TO A SPECIALIST, IT MAY TAKE 1-2 WEEKS TO SCHEDULE/PROCESS THE REFERRAL. IF YOU HAVE NOT HEARD FROM US/SPECIALIST IN TWO WEEKS, PLEASE GIVE [place] A CALL AT [phone].   THE PATIENT IS ENCOURAGED TO PRACTICE SOCIAL DISTANCING DUE TO THE COVID-19 PANDEMIC.

## 2023-01-22 NOTE — Patient Instructions (Signed)

## 2023-01-23 ENCOUNTER — Ambulatory Visit (INDEPENDENT_AMBULATORY_CARE_PROVIDER_SITE_OTHER): Payer: Medicare Other | Admitting: Internal Medicine

## 2023-01-23 ENCOUNTER — Encounter: Payer: Self-pay | Admitting: Internal Medicine

## 2023-01-23 VITALS — BP 90/62 | HR 76 | Ht 67.0 in | Wt 183.8 lb

## 2023-01-23 DIAGNOSIS — N1832 Chronic kidney disease, stage 3b: Secondary | ICD-10-CM | POA: Diagnosis not present

## 2023-01-23 DIAGNOSIS — I480 Paroxysmal atrial fibrillation: Secondary | ICD-10-CM

## 2023-01-23 DIAGNOSIS — Z720 Tobacco use: Secondary | ICD-10-CM

## 2023-01-23 DIAGNOSIS — Z5941 Food insecurity: Secondary | ICD-10-CM

## 2023-01-23 DIAGNOSIS — I5042 Chronic combined systolic (congestive) and diastolic (congestive) heart failure: Secondary | ICD-10-CM | POA: Diagnosis not present

## 2023-01-23 DIAGNOSIS — R634 Abnormal weight loss: Secondary | ICD-10-CM

## 2023-01-23 DIAGNOSIS — M722 Plantar fascial fibromatosis: Secondary | ICD-10-CM

## 2023-01-23 DIAGNOSIS — N2581 Secondary hyperparathyroidism of renal origin: Secondary | ICD-10-CM

## 2023-01-23 DIAGNOSIS — I130 Hypertensive heart and chronic kidney disease with heart failure and stage 1 through stage 4 chronic kidney disease, or unspecified chronic kidney disease: Secondary | ICD-10-CM

## 2023-01-23 DIAGNOSIS — J438 Other emphysema: Secondary | ICD-10-CM | POA: Diagnosis not present

## 2023-01-23 DIAGNOSIS — Z6828 Body mass index (BMI) 28.0-28.9, adult: Secondary | ICD-10-CM

## 2023-01-23 DIAGNOSIS — E1122 Type 2 diabetes mellitus with diabetic chronic kidney disease: Secondary | ICD-10-CM

## 2023-01-23 DIAGNOSIS — D6869 Other thrombophilia: Secondary | ICD-10-CM

## 2023-01-23 DIAGNOSIS — Z794 Long term (current) use of insulin: Secondary | ICD-10-CM

## 2023-01-23 DIAGNOSIS — E663 Overweight: Secondary | ICD-10-CM

## 2023-01-23 DIAGNOSIS — E7800 Pure hypercholesterolemia, unspecified: Secondary | ICD-10-CM

## 2023-01-26 LAB — PREALBUMIN: PREALBUMIN: 22 mg/dL (ref 9–32)

## 2023-01-26 LAB — PTH, INTACT AND CALCIUM: PTH: 45 pg/mL (ref 15–65)

## 2023-01-26 LAB — CMP14+EGFR
ALT: 14 IU/L (ref 0–32)
AST: 19 IU/L (ref 0–40)
Albumin: 4.2 g/dL (ref 3.8–4.8)
Alkaline Phosphatase: 81 IU/L (ref 44–121)
BUN/Creatinine Ratio: 18 (ref 12–28)
BUN: 26 mg/dL (ref 8–27)
Bilirubin Total: 0.3 mg/dL (ref 0.0–1.2)
CO2: 23 mmol/L (ref 20–29)
Calcium: 10 mg/dL (ref 8.7–10.3)
Chloride: 101 mmol/L (ref 96–106)
Creatinine, Ser: 1.47 mg/dL — ABNORMAL HIGH (ref 0.57–1.00)
Globulin, Total: 2.9 g/dL (ref 1.5–4.5)
Glucose: 87 mg/dL (ref 70–99)
Potassium: 4.2 mmol/L (ref 3.5–5.2)
Sodium: 140 mmol/L (ref 134–144)
Total Protein: 7.1 g/dL (ref 6.0–8.5)
eGFR: 36 mL/min/{1.73_m2} — ABNORMAL LOW (ref >59)

## 2023-01-26 LAB — PROTEIN ELECTROPHORESIS, SERUM
A/G Ratio: 0.8 (ref 0.7–1.7)
Albumin ELP: 3.2 g/dL (ref 2.9–4.4)
Alpha 1: 0.3 g/dL (ref 0.0–0.4)
Alpha 2: 1.4 g/dL — ABNORMAL HIGH (ref 0.4–1.0)
Beta: 1 g/dL (ref 0.7–1.3)
Gamma Globulin: 1.1 g/dL (ref 0.4–1.8)
Globulin, Total: 3.9 g/dL (ref 2.2–3.9)

## 2023-01-26 LAB — HEMOGLOBIN A1C
Est. average glucose Bld gHb Est-mCnc: 128 mg/dL
Hgb A1c MFr Bld: 6.1 % — ABNORMAL HIGH (ref 4.8–5.6)

## 2023-01-26 LAB — LIPID PANEL
Chol/HDL Ratio: 3 ratio (ref 0.0–4.4)
Cholesterol, Total: 108 mg/dL (ref 100–199)
HDL: 36 mg/dL — ABNORMAL LOW (ref >39)
LDL Chol Calc (NIH): 42 mg/dL (ref 0–99)
Triglycerides: 182 mg/dL — ABNORMAL HIGH (ref 0–149)
VLDL Cholesterol Cal: 30 mg/dL (ref 5–40)

## 2023-01-26 LAB — PHOSPHORUS: Phosphorus: 3.5 mg/dL (ref 3.0–4.3)

## 2023-01-28 ENCOUNTER — Ambulatory Visit: Payer: Self-pay

## 2023-01-28 NOTE — Patient Outreach (Signed)
  Care Coordination   Follow Up Visit Note   01/28/2023 Name: [name] MRN: [id number] DOB: [birth year]  [name] is a 77 y.o. year old female who sees Dorothyann Peng, MD for primary care. I spoke with  [name] by phone today.  What matters to the patients health and wellness today?  Patient would like to receive treatment for her foot condition. She would like to avoid from falling while dealing with plantar fascitis.     Goals Addressed             This Visit's Progress    To have my left foot pain evaluated and treated   On track    Care Coordination Interventions: Evaluation of current treatment plan related to left plantar fascitis and patient's adherence to plan as established by provider Reviewed and discussed with patient, PCP follow up and referral for Podiatry to evaluate and treat patient's plantar fascitis  Determined patient continues to have severe pain to her left foot from this condition, impairing her physical mobility Discussed patient missed a call from [facility], she will return the call today in order to get an appointment as early as possible Assessed for DME needs, patient is using a cane, she has a walker if needed  Educated on fall precautions  Assessed for SDOH barriers, educated patient about SW availability for assistance with resource needs including MOW and transportation, patient declines at this time, she is not eligible for MOW due she still drives herself to appointments       To increase nutritional intake and maintain healthy weight       Care Coordination Interventions: Evaluation of current treatment plan related to Nutritional Imbalance  and patient's adherence to plan as established by provider Reviewed and discussed with patient she has lost approximately 25 lbs over the past 6 months, she believes this is due to having foot pain which impairs her ability to stand and prepare meals  Determined PCP provided  Glucerna samples for protein supplementation  Determined patient is not eligible for MOW due she is still driving Instructed patient on use of protein supplement and discussed patient believes she will be able to better prepare meals for herself after receiving treatment for her foot condition Body mass index is 28.79 kg/m.     Wt Readings from Last 3 Encounters:  01/23/23 183 lb 12.8 oz (83.4 kg)  12/16/22 197 lb (89.4 kg)  07/29/22 212 lb 3.2 oz (96.3 kg)       Interventions Today    Flowsheet Row Most Recent Value  Chronic Disease   Chronic disease during today's visit Diabetes, Chronic Kidney Disease/End Stage Renal Disease (ESRD), Other  [Plantar fasciitis]  General Interventions   General Interventions Discussed/Reviewed General Interventions Discussed, General Interventions Reviewed, Doctor Visits, Labs  Doctor Visits Discussed/Reviewed Doctor Visits Discussed, Doctor Visits Reviewed, Specialist, PCP  Education Interventions   Education Provided Provided Education  Provided Verbal Education On Foot Care, When to see the doctor, Labs, Medication  Labs Reviewed Hgb A1c, Kidney Function, Lipid Profile  Nutrition Interventions   Nutrition Discussed/Reviewed Nutrition Discussed, Nutrition Reviewed, Increasing proteins          SDOH assessments and interventions completed:  No     Care Coordination Interventions:  Yes, provided   Follow up plan: Follow up call scheduled for 02/11/23 @10 :30 AM    Encounter Outcome:  Pt. Visit Completed

## 2023-01-28 NOTE — Patient Instructions (Addendum)
Visit Information  Thank you for taking time to visit with me today. Please don't hesitate to contact me if I can be of assistance to you.   Following are the goals we discussed today:   Goals Addressed             This Visit's Progress    To have my left foot pain evaluated and treated   On track    Care Coordination Interventions: Evaluation of current treatment plan related to left plantar fascitis and patient's adherence to plan as established by provider Reviewed and discussed with patient, PCP follow up and referral for Podiatry to evaluate and treat patient's plantar fascitis  Determined patient continues to have severe pain to her left foot from this condition, impairing her physical mobility Discussed patient missed a call from [facility], she will return the call today in order to get an appointment as early as possible Assessed for DME needs, patient is using a cane, she has a walker if needed  Educated on fall precautions Assessed for SDOH barriers, educated patient about SW availability for assistance with resource needs including MOW and transportation, patient declines at this time, she is not eligible for MOW due she still drives herself to appointments        To increase nutritional intake and maintain healthy weight       Care Coordination Interventions: Evaluation of current treatment plan related to Nutritional Imbalance  and patient's adherence to plan as established by provider Reviewed and discussed with patient she has lost approximately 25 lbs over the past 6 months, she believes this is due to having foot pain which impairs her ability to stand and prepare meals  Determined PCP provided Glucerna samples for protein supplementation  Determined patient is not eligible for MOW due she is still driving Instructed patient on use of protein supplement and discussed patient believes she will be able to better prepare meals for herself after receiving  treatment for her foot condition Body mass index is 28.79 kg/m.     Wt Readings from Last 3 Encounters:  01/23/23 183 lb 12.8 oz (83.4 kg)  12/16/22 197 lb (89.4 kg)  07/29/22 212 lb 3.2 oz (96.3 kg)               Our next appointment is by telephone on 02/11/23 at 10:30 AM  Please call the care guide team at [phone] if you need to cancel or reschedule your appointment.   If you are experiencing a Mental Health or Behavioral Health Crisis or need someone to talk to, please call 1-800-273-TALK (toll free, 24 hour hotline)  Patient verbalizes understanding of instructions and care plan provided today and agrees to view in MyChart. Active MyChart status and patient understanding of how to access instructions and care plan via MyChart confirmed with patient.     Delsa Sale, RN, BSN, CCM Care Management Coordinator [facility] Care Management  Direct Phone: [phone]

## 2023-02-07 ENCOUNTER — Ambulatory Visit: Payer: Medicare Other | Attending: Internal Medicine

## 2023-02-07 ENCOUNTER — Ambulatory Visit: Payer: Medicare Other | Admitting: Cardiology

## 2023-02-09 DIAGNOSIS — R634 Abnormal weight loss: Secondary | ICD-10-CM | POA: Insufficient documentation

## 2023-02-09 DIAGNOSIS — I1310 Hypertensive heart and chronic kidney disease without heart failure, with stage 1 through stage 4 chronic kidney disease, or unspecified chronic kidney disease: Secondary | ICD-10-CM | POA: Insufficient documentation

## 2023-02-09 NOTE — Assessment & Plan Note (Signed)
Chronic, I will check labs as below. She is now on weekly semaglutide.

## 2023-02-09 NOTE — Assessment & Plan Note (Signed)
Chronic, rate controlled with metoprolol XL 50mg  daily and anticoagulated with Eliquis.

## 2023-02-09 NOTE — Assessment & Plan Note (Signed)
Importance of tobacco cessation was discussed with the patient. She is due for LDCT. However, she does have cardiac CT scheduled Sept 2024. I will be sure to review these results.

## 2023-02-09 NOTE — Assessment & Plan Note (Signed)
I will refer her to Podiatry for further evaluation. She was given exercises to perform prior to getting out of bed in the mornings and while seated.

## 2023-02-09 NOTE — Assessment & Plan Note (Signed)
Chronic, she has soft blood pressure today, denies dizziness. Encouraged to stay well hydrated. Advised to add a little salt to her food TODAY.

## 2023-02-09 NOTE — Assessment & Plan Note (Signed)
Chronic, PTH last checked Feb 2024. She has CKD stage 3b.

## 2023-02-09 NOTE — Assessment & Plan Note (Signed)
Chronic, currently on metoprolol XL, enalapril, digoxin and furosemide. Importance of medication and dietary compliance was stressed to the patient.

## 2023-02-09 NOTE — Assessment & Plan Note (Signed)
She is currently on Eliquis due to underlying PAF.

## 2023-02-09 NOTE — Assessment & Plan Note (Signed)
Chronic, LDL goal is less than 70.  She will continue with Livalo 2mg  weekly.  She is encouraged to avoid fried foods and to incorporate more chair exercises into her daily routine.

## 2023-02-09 NOTE — Assessment & Plan Note (Addendum)
She has lost 14lbs since July 2024 and 29 lbs since February.  She is on Ozempic; however, I do not think her weight loss is due to this medication solely. Thyroid function was normal in Feb 2024.

## 2023-02-09 NOTE — Assessment & Plan Note (Signed)
Emphysematous changes seen on Jan 2023 LDCT.  Will consider Pulmonary evaluation.

## 2023-02-10 ENCOUNTER — Other Ambulatory Visit: Payer: Self-pay

## 2023-02-10 MED ORDER — OZEMPIC (0.25 OR 0.5 MG/DOSE) 2 MG/3ML ~~LOC~~ SOPN: 0.5000 mg | PEN_INJECTOR | SUBCUTANEOUS | 2 refills | Status: DC

## 2023-02-10 MED ORDER — EZETIMIBE 10 MG PO TABS: 10.0000 mg | ORAL_TABLET | Freq: Every day | ORAL | 1 refills | Status: DC

## 2023-02-11 ENCOUNTER — Telehealth: Payer: Self-pay

## 2023-02-11 ENCOUNTER — Ambulatory Visit: Payer: Self-pay

## 2023-02-11 NOTE — Patient Instructions (Signed)
Visit Information  Thank you for taking time to visit with me today. Please don't hesitate to contact me if I can be of assistance to you.   Following are the goals we discussed today:   Goals Addressed             This Visit's Progress    To have my left foot pain evaluated and treated       Care Coordination Interventions: Evaluation of current treatment plan related to left plantar fascitis and patient's adherence to plan as established by provider Reviewed and discussed with patient her upcoming scheduled visit with [facility] scheduled for Monday, 02/17/23 Determined patient's pain has slightly improved, she is taking less Tylenol and reports using her cane for ambulation and balance Determined patient plans to reschedule her upcoming cardiac CT and cardiac ablation until after her foot pain subsides Instructed patient to contact Dr. Allyne Gee for further concerns or new symptoms  Reviewed and discussed patient's next scheduled PCP office visit with Dr. Allyne Gee scheduled for 04/01/23 @2  PM        Our next appointment is by telephone on 04/03/23 at 10:30 AM  Please call the care guide team at [phone] if you need to cancel or reschedule your appointment.   If you are experiencing a Mental Health or Behavioral Health Crisis or need someone to talk to, please call 1-800-273-TALK (toll free, 24 hour hotline)  Patient verbalizes understanding of instructions and care plan provided today and agrees to view in MyChart. Active MyChart status and patient understanding of how to access instructions and care plan via MyChart confirmed with patient.     Delsa Sale RN BSN CCM [place]  [facility], [facility] Nurse Care Coordinator  Direct Dial: [phone] Website: [email or web address]

## 2023-02-11 NOTE — Telephone Encounter (Signed)
Pt has been rescheduled to 12/26 at 1:30 PM for her AF Ablation with Dr. Elberta Fortis.   She has not seen Dr. Elberta Fortis in clinic since 10/23 so I scheduled her an appointment on 12/3. She will have labs done that day and we can go over Instruction letters at that time.   I have rescheduled her CT Scan to 12/16 at 1:00 at [facility].

## 2023-02-11 NOTE — Telephone Encounter (Signed)
-----   Message from Nurse Sherri P sent at 02/10/2023  6:06 PM EDT ----- Regarding: FW: Reschedule Left another message to call back ----- Message ----- From: Baird Lyons, RN Sent: 02/10/2023   8:35 AM EDT To: Baird Lyons, RN; Sayid Moll, [place] Subject: RE: Reschedule                                 Left message to call back ----- Message ----- From: Cleda Mccreedy, CMA Sent: 02/10/2023   8:26 AM EDT To: Baird Lyons, RN Subject: FW: Reschedule                                  ----- Message ----- From: Jolyn Nap, RN Sent: 02/09/2023   3:39 PM EDT To: Regan Lemming, MD; Miquel Stacks, [place] Subject: Reschedule                                     Called Ms Austad today to  make sure she knows to hold Ozempic in preparation for her procedure 9/18.  She states she can't walk, she has an appt this week with [facility].  She states she needs to reschedule her procedure.  Sadako Cegielski, I will leave her on scheduled for 9/18, until you confirm and get her rescheduled.  Thanks [employer]

## 2023-02-11 NOTE — Patient Outreach (Signed)
  Care Coordination   Follow Up Visit Note   02/11/2023 Name: AZARIYA WALTMAN MRN: [id number] DOB: 1945-10-20  KESHANNA JANICKE is a 77 y.o. year old female who sees Dorothyann Peng, MD for primary care. I spoke with  [name] by phone today.  What matters to the patients health and wellness today?  Patient would like to have her foot pain treated by the foot doctor.     Goals Addressed             This Visit's Progress    To have my left foot pain evaluated and treated       Care Coordination Interventions: Evaluation of current treatment plan related to left plantar fascitis and patient's adherence to plan as established by provider Reviewed and discussed with patient her upcoming scheduled visit with [facility] scheduled for Monday, 02/17/23 Determined patient's pain has slightly improved, she is taking less Tylenol and reports using her cane for ambulation and balance Determined patient plans to reschedule her upcoming cardiac CT and cardiac ablation until after her foot pain subsides Instructed patient to contact Dr. Allyne Gee for further concerns or new symptoms  Reviewed and discussed patient's next scheduled PCP office visit with Dr. Allyne Gee scheduled for 04/01/23 @2  PM    Interventions Today    Flowsheet Row Most Recent Value  Chronic Disease   Chronic disease during today's visit Atrial Fibrillation (AFib), Other  [plantar fasciitis]  General Interventions   General Interventions Discussed/Reviewed General Interventions Discussed, General Interventions Reviewed, Doctor Visits  Doctor Visits Discussed/Reviewed Doctor Visits Discussed, Doctor Visits Reviewed, PCP, Specialist  Education Interventions   Education Provided Provided Education  Provided Verbal Education On When to see the doctor  Safety Interventions   Safety Discussed/Reviewed Safety Discussed, Safety Reviewed, Fall Risk, Home Safety  Home Safety Assistive Devices          SDOH  assessments and interventions completed:  No     Care Coordination Interventions:  Yes, provided   Follow up plan: Follow up call scheduled for 04/03/23 @10 :30 AM    Encounter Outcome:  Patient Visit Completed

## 2023-02-14 ENCOUNTER — Ambulatory Visit (HOSPITAL_COMMUNITY): Payer: Medicare Other

## 2023-02-17 DIAGNOSIS — I129 Hypertensive chronic kidney disease with stage 1 through stage 4 chronic kidney disease, or unspecified chronic kidney disease: Secondary | ICD-10-CM | POA: Diagnosis not present

## 2023-02-17 DIAGNOSIS — I5020 Unspecified systolic (congestive) heart failure: Secondary | ICD-10-CM | POA: Diagnosis not present

## 2023-02-17 DIAGNOSIS — E1122 Type 2 diabetes mellitus with diabetic chronic kidney disease: Secondary | ICD-10-CM | POA: Diagnosis not present

## 2023-02-17 DIAGNOSIS — I480 Paroxysmal atrial fibrillation: Secondary | ICD-10-CM | POA: Diagnosis not present

## 2023-02-17 DIAGNOSIS — N1832 Chronic kidney disease, stage 3b: Secondary | ICD-10-CM | POA: Diagnosis not present

## 2023-02-17 DIAGNOSIS — N2581 Secondary hyperparathyroidism of renal origin: Secondary | ICD-10-CM | POA: Diagnosis not present

## 2023-02-19 ENCOUNTER — Other Ambulatory Visit: Payer: Self-pay | Admitting: Internal Medicine

## 2023-02-23 ENCOUNTER — Other Ambulatory Visit: Payer: Self-pay | Admitting: Internal Medicine

## 2023-02-23 ENCOUNTER — Other Ambulatory Visit: Payer: Self-pay | Admitting: Cardiovascular Disease

## 2023-02-23 DIAGNOSIS — I4719 Other supraventricular tachycardia: Secondary | ICD-10-CM

## 2023-02-23 DIAGNOSIS — I480 Paroxysmal atrial fibrillation: Secondary | ICD-10-CM

## 2023-03-04 ENCOUNTER — Ambulatory Visit (INDEPENDENT_AMBULATORY_CARE_PROVIDER_SITE_OTHER): Payer: Medicare Other

## 2023-03-04 ENCOUNTER — Encounter: Payer: Self-pay | Admitting: Podiatry

## 2023-03-04 ENCOUNTER — Ambulatory Visit: Payer: Medicare Other | Admitting: Podiatry

## 2023-03-04 DIAGNOSIS — M79672 Pain in left foot: Secondary | ICD-10-CM

## 2023-03-04 DIAGNOSIS — M79674 Pain in right toe(s): Secondary | ICD-10-CM

## 2023-03-04 DIAGNOSIS — M79675 Pain in left toe(s): Secondary | ICD-10-CM

## 2023-03-04 DIAGNOSIS — B351 Tinea unguium: Secondary | ICD-10-CM

## 2023-03-04 DIAGNOSIS — I70222 Atherosclerosis of native arteries of extremities with rest pain, left leg: Secondary | ICD-10-CM | POA: Diagnosis not present

## 2023-03-04 NOTE — Patient Instructions (Signed)
Call [phone] to schedule your vascular consult and testing:  [facility] [address] [place], [place] [address]

## 2023-03-06 NOTE — Progress Notes (Signed)
Subjective:  Patient ID: [name], female    DOB: Jan 06, 1946,  MRN: [id number]  Chief Complaint  Patient presents with   Plantar Fasciitis    Left heel and arch pain, for 3/4 months. Some improvement since. Diabetic foot care    77 y.o. female presents with the above complaint. History confirmed with patient.  She returns for follow-up and notes that the left foot pain is worsening and spreading and feels a cramping feeling into her arch.  This is even happening at night.  She says this feels different than the gout flare that she had previously.  Her nails are also thickened elongated causing pain and discomfort.  Objective:  Physical Exam: warm, good capillary refill on her right foot, cool to the midfoot and toes on the left foot with darkening discoloration and nonpalpable DP and PT pulse Left Foot: dystrophic yellowed discolored nail plates with subungual debris, mild edema and pain Right Foot: dystrophic yellowed discolored nail plates with subungual debris  No images are attached to the encounter.  Assessment:   1. Pain of left heel   2. Atherosclerosis of native artery of left lower extremity with rest pain (HCC)   3. Pain due to onychomycosis of toenails of both feet      Plan:  Patient was evaluated and treated and all questions answered.  She previously had noninvasive vascular testing in April 2023 which showed some infrapopliteal disease.  Her clinical exam shows darkening discoloration cool temperature and dysvascular appearance of the forefoot.  I recommended referral to vascular surgery and this was ordered.  Suspect the pain she is having at night is a rest pain type symptom.  I will see her back in a few months to reevaluate.  Discussed the etiology and treatment options for the condition in detail with the patient. Recommended debridement of the nails today. Sharp and mechanical debridement performed of all painful and mycotic nails today. Nails debrided in  length and thickness using a nail nipper to level of comfort. Discussed treatment options including appropriate shoe gear. Follow up as needed for painful nails.    Return in about 2 months (around 05/04/2023).

## 2023-03-17 ENCOUNTER — Other Ambulatory Visit: Payer: Self-pay | Admitting: *Deleted

## 2023-03-17 DIAGNOSIS — I739 Peripheral vascular disease, unspecified: Secondary | ICD-10-CM

## 2023-03-20 ENCOUNTER — Other Ambulatory Visit: Payer: Self-pay | Admitting: Internal Medicine

## 2023-03-26 NOTE — Progress Notes (Addendum)
Patient ID: [name], female   DOB: [birth year], 77 y.o.   MRN: [id number]  Reason for Consult: New Patient (Initial Visit)   Referred by Edwin Cap, DPM  Subjective:     HPI  [name] is a 77 y.o. female with A-fib on Eliquis, CHF, HTN, HLD, type 2 diabetes and CKD with a GFR of 36.  She presents today for evaluation of the left foot pain and discoloration.  She reports that since June she has had left foot pain that bothered her throughout the entire day.  She reports it is worse at night and wakes her up.  Over the last few months he states that it has been slightly better and only notices the pain at night few days per week.  She denies any history of claudication.  She denies ulceration.  She denies any previous vascular surgery interventions. She is a current smoker and smokes about half a pack per day. She does have shortness of breath with activity and wheezing.  Past Medical History:  Diagnosis Date   Arthritis    "left knee" (03/12/2013)   Atrial fibrillation (HCC)    Cardioverted to NSR 08/22/2009   Atrial tachycardia (HCC) 09/30/2019   Cardiomyopathy    CHF (congestive heart failure) (HCC)    Chronic combined systolic and diastolic heart failure (HCC) 04/09/2021   CKD (chronic kidney disease)    Depression    Dizziness    DM2 (diabetes mellitus, type 2) (HCC)    fasting 80-100 (03/12/2013)   Drug therapy 1/11   coumadin   Ejection fraction < 50%    20-25%, echo.06/2009, /  EF 30-35%, diffuse hypokinesis, echo, Oct 16, 2010   GERD (gastroesophageal reflux disease)    Gout    "left knee" (03/12/2013)   History of bronchitis    History of emphysema (HCC)    HLD (hyperlipidemia)    Hyperkalemia    Hypertension    LFT elevation    06/2009   Mitral regurgitation    mild - echo - 1/11 /  Mild, echo, May15, 2012   Overweight(278.02)    Pneumonia    "@ least twice" (03/12/2013)   Thyroid dysfunction    thyromegally,diffuse,nodule LLL,06/2009    Tobacco abuse    Tricuspid regurgitation    moderate - echo - 1/11   Family History  Problem Relation Age of Onset   Stroke Mother    Prostate cancer Father    Hypertension Sister    Stroke Maternal Grandfather    Stroke Paternal Grandmother    Heart attack Son    Hypertension Son        X2   Colon cancer Other    Heart disease Other    Breast cancer Neg Hx    Past Surgical History:  Procedure Laterality Date   BIOPSY  09/29/2020   Procedure: BIOPSY;  Surgeon: Jeani Hawking, MD;  Location: [facility] ENDOSCOPY;  Service: Endoscopy;;   BREAST BIOPSY Right 1980's   BREAST LUMPECTOMY Right 1980's   CARDIOVERSION N/A 10/15/2019   Procedure: CARDIOVERSION;  Surgeon: Chilton Si, MD;  Location: [facility] ENDOSCOPY;  Service: Cardiovascular;  Laterality: N/A;   CATARACT EXTRACTION W/ INTRAOCULAR LENS  IMPLANT, BILATERAL Bilateral 2010-2012   CHOLECYSTECTOMY  ~ 1978   ESOPHAGOGASTRODUODENOSCOPY (EGD) WITH PROPOFOL N/A 09/29/2020   Procedure: ESOPHAGOGASTRODUODENOSCOPY (EGD) WITH PROPOFOL;  Surgeon: Jeani Hawking, MD;  Location: [facility] ENDOSCOPY;  Service: Endoscopy;  Laterality: N/A;   JOINT REPLACEMENT     TONSILLECTOMY  1960's   TOTAL KNEE ARTHROPLASTY Left 03/12/2013   TOTAL KNEE ARTHROPLASTY Left 03/12/2013   Procedure: TOTAL KNEE ARTHROPLASTY;  Surgeon: Nadara Mustard, MD;  Location: [facility] OR;  Service: Orthopedics;  Laterality: Left;  Left total knee arthroplasty   TUBAL LIGATION     UPPER ESOPHAGEAL ENDOSCOPIC ULTRASOUND (EUS) N/A 09/29/2020   Procedure: UPPER ESOPHAGEAL ENDOSCOPIC ULTRASOUND (EUS);  Surgeon: Jeani Hawking, MD;  Location: [facility] ENDOSCOPY;  Service: Endoscopy;  Laterality: N/A;   VAGINAL HYSTERECTOMY  1987    Short Social History:  Social History   Tobacco Use   Smoking status: Every Day    Current packs/day: 0.50    Average packs/day: 0.5 packs/day for 40.0 years (20.0 ttl pk-yrs)    Types: Cigarettes   Smokeless tobacco: Never   Tobacco comments:    smoked 1.5ppd x 9,  1ppdx40, cut back to 1/2ppd past 5 years  Substance Use Topics   Alcohol use: No    Allergies  Allergen Reactions   Codeine Hives and Nausea And Vomiting   Augmentin [Amoxicillin-Pot Clavulanate] Diarrhea   Coreg [Carvedilol] Other (See Comments)    No energy   Warfarin And Related Other (See Comments)    Severe rectal bleeding    Current Outpatient Medications  Medication Sig Dispense Refill   acetaminophen (TYLENOL) 500 MG tablet Take 1,000 mg by mouth 3 (three) times daily as needed for pain.     BD PEN NEEDLE NANO U/F 32G X 4 MM MISC USE AS DIRECTED WITH INSULIN PENS 100 each 11   betamethasone valerate ointment (VALISONE) 0.1 % Apply 1 Application topically 2 (two) times daily. prn (Patient taking differently: Apply 1 Application topically daily as needed (Dry skin).) 30 g 0   Cholecalciferol (VITAMIN D) 2000 UNITS CAPS Take 2,000 Units by mouth daily.     diclofenac Sodium (VOLTAREN) 1 % GEL Apply 4 g topically 4 (four) times daily. (Patient taking differently: Apply 4 g topically 4 (four) times daily as needed (pain).) 100 g 0   digoxin (LANOXIN) 0.125 MG tablet Take 1 tablet (125 mcg total) by mouth daily. Please keep your upcoming appointment for refills. 90 tablet 1   ELIQUIS 5 MG TABS tablet TAKE 1 TABLET BY MOUTH TWICE DAILY 60 tablet 10   enalapril (VASOTEC) 5 MG tablet Take 1 tablet (5 mg total) by mouth at bedtime. Please keep your upcoming appointment for refills. 90 tablet 1   ezetimibe (ZETIA) 10 MG tablet Take 1 tablet (10 mg total) by mouth daily. 90 tablet 1   furosemide (LASIX) 40 MG tablet TAKE 1 TABLET BY MOUTH AS NEEDED FOR EDEMA OR FLUID 30 tablet 10   glucose blood (ONETOUCH VERIO) test strip USE AS DIRECTED TO CHECK BLOOD SUGARS 1 TIME PER DAY 50 strip 7   KLOR-CON M20 20 MEQ tablet TAKE 1 TABLET (20 MEQ TOTAL) BY MOUTH DAILY. WHEN TAKING FUROSEMIDE (LASIX). 90 tablet 2   LIVALO 2 MG TABS TAKE 1 TABLET BY MOUTH EVERY DAY 90 tablet 2   metoprolol succinate  (TOPROL-XL) 50 MG 24 hr tablet Take 1 tablet (50 mg total) by mouth daily. Take with or immediately following a meal. 90 tablet 3   Multiple Vitamin (MULITIVITAMIN WITH MINERALS) TABS Take 1 tablet by mouth daily. Centrum Silver     nystatin cream (MYCOSTATIN) APPLY TO AFFECTED AREA(S) TWICE DAILY AS NEEDED FOR DRY SKIN UNDER BREASTS 30 g 10   pantoprazole (PROTONIX) 20 MG tablet TAKE 1 TABLET BY MOUTH DAILY AS NEEDED  FOR HEARTBURN. 90 tablet 1   Semaglutide,0.25 or 0.5MG /DOS, (OZEMPIC, 0.25 OR 0.5 MG/DOSE,) 2 MG/3ML SOPN Inject 0.5 mg into the skin once a week. 3 mL 2   tiZANidine (ZANAFLEX) 4 MG tablet TAKE 1 TABLET (4 MG TOTAL) BY MOUTH DAILY AS NEEDED FOR BACK PAIN 30 tablet 1   vitamin C (ASCORBIC ACID) 500 MG tablet Take 500 mg by mouth daily.     No current facility-administered medications for this visit.    REVIEW OF SYSTEMS  Negative other than noted in HPI     Objective:  Objective   Vitals:   03/28/23 1158  BP: 117/61  Pulse: (!) 57  Resp: 20  Temp: 97.8 F (36.6 C)  SpO2: 99%  Weight: 184 lb (83.5 kg)  Height: 5\' 7"  (1.702 m)   Body mass index is 28.82 kg/m.  Physical Exam General: no acute distress Cardiac: hemodynamically stable, nontachycardic Pulm: normal work of breathing Neuro: alert, no focal deficit Extremities: no edema, dark discoloration of left forefoot and toes.  No wounds Vascular:   Right: Palpable femoral, DP  Left: Palpable femoral, nonpalpable pedal's   Data: ABI: +---------+------------------+-----+----------+--------+  Right   Rt Pressure (mmHg)IndexWaveform  Comment   +---------+------------------+-----+----------+--------+  Brachial 139                                        +---------+------------------+-----+----------+--------+  PTA     254               1.78 monophasic          +---------+------------------+-----+----------+--------+  DP      254               1.78 biphasic             +---------+------------------+-----+----------+--------+  Kerby Nora               0.82 Normal              +---------+------------------+-----+----------+--------+   +---------+------------------+-----+-------------------+-------+  Left    Lt Pressure (mmHg)IndexWaveform           Comment  +---------+------------------+-----+-------------------+-------+  Brachial 143                                                +---------+------------------+-----+-------------------+-------+  PTA     131               0.92 monophasic                  +---------+------------------+-----+-------------------+-------+  DP      254               1.78 dampened monophasic         +---------+------------------+-----+-------------------+-------+  Great Toe41                0.29 Abnormal                    +---------+------------------+-----+-------------------+-------+    Previously: Right noncompressible with TP 132        Left noncompressible with TP 49  Previous duplex on 09/10/2021 demonstrated infrapopliteal disease with possible one-vessel runoff via the AT   Assessment/Plan:     [name] is a 77 y.o. female with chronic limb  threatening ischemia of the left leg with rest pain.  We had an extensive discussion regarding the natural history of PAD and its risk factors including current smoking.  We discussed the stages of PAD and given that she is currently in rest pain with a significantly low toe pressure that I would recommend an angiogram.  The risks and benefits were reviewed and she expressed understanding. We also discussed that patients with CL TI have approximately 50% risk of amputation at 5 years.  She would like to discuss with her son and her endocrinologist whom she has a good relationship with.  I encouraged smoking cessation as this is the only modifiable risk factor that will worsen arterial disease.    Plan for virtual visit in 2  weeks   Recommendations to optimize cardiovascular risk: Abstinence from all tobacco products. Blood glucose control with goal A1c < 7%. Blood pressure control with goal blood pressure < 140/90 mmHg. Lipid reduction therapy with goal LDL-C <100 mg/dL  Aspirin 81mg  PO QD.  Atorvastatin 40-80mg  PO QD (or other "high intensity" statin therapy).     Daria Pastures MD [facility]

## 2023-03-28 ENCOUNTER — Ambulatory Visit: Payer: Medicare Other | Admitting: Vascular Surgery

## 2023-03-28 ENCOUNTER — Encounter: Payer: Self-pay | Admitting: Vascular Surgery

## 2023-03-28 ENCOUNTER — Ambulatory Visit (HOSPITAL_COMMUNITY)
Admission: RE | Admit: 2023-03-28 | Discharge: 2023-03-28 | Disposition: A | Discharge status: Home / Self Care | Payer: Medicare Other | Source: Ambulatory Visit | Attending: Vascular Surgery

## 2023-03-28 VITALS — BP 117/61 | HR 57 | Temp 97.8°F | Resp 20 | Ht 67.0 in | Wt 184.0 lb

## 2023-03-28 DIAGNOSIS — N1832 Chronic kidney disease, stage 3b: Secondary | ICD-10-CM

## 2023-03-28 DIAGNOSIS — I739 Peripheral vascular disease, unspecified: Secondary | ICD-10-CM | POA: Insufficient documentation

## 2023-03-28 DIAGNOSIS — E785 Hyperlipidemia, unspecified: Secondary | ICD-10-CM

## 2023-03-28 DIAGNOSIS — I10 Essential (primary) hypertension: Secondary | ICD-10-CM | POA: Diagnosis not present

## 2023-03-28 DIAGNOSIS — I5030 Unspecified diastolic (congestive) heart failure: Secondary | ICD-10-CM | POA: Diagnosis not present

## 2023-03-28 LAB — VAS US ABI WITH/WO TBI

## 2023-04-01 ENCOUNTER — Ambulatory Visit: Payer: Medicare Other | Admitting: Internal Medicine

## 2023-04-01 ENCOUNTER — Encounter: Payer: Self-pay | Admitting: Internal Medicine

## 2023-04-01 VITALS — BP 108/70 | HR 88 | Temp 98.1°F | Ht 67.0 in | Wt 179.0 lb

## 2023-04-01 DIAGNOSIS — Z794 Long term (current) use of insulin: Secondary | ICD-10-CM | POA: Diagnosis not present

## 2023-04-01 DIAGNOSIS — Z23 Encounter for immunization: Secondary | ICD-10-CM | POA: Diagnosis not present

## 2023-04-01 DIAGNOSIS — E1151 Type 2 diabetes mellitus with diabetic peripheral angiopathy without gangrene: Secondary | ICD-10-CM | POA: Diagnosis not present

## 2023-04-01 DIAGNOSIS — E2839 Other primary ovarian failure: Secondary | ICD-10-CM

## 2023-04-01 DIAGNOSIS — I5042 Chronic combined systolic (congestive) and diastolic (congestive) heart failure: Secondary | ICD-10-CM

## 2023-04-01 DIAGNOSIS — N2581 Secondary hyperparathyroidism of renal origin: Secondary | ICD-10-CM

## 2023-04-01 DIAGNOSIS — E7800 Pure hypercholesterolemia, unspecified: Secondary | ICD-10-CM

## 2023-04-01 DIAGNOSIS — I130 Hypertensive heart and chronic kidney disease with heart failure and stage 1 through stage 4 chronic kidney disease, or unspecified chronic kidney disease: Secondary | ICD-10-CM

## 2023-04-01 DIAGNOSIS — Z0000 Encounter for general adult medical examination without abnormal findings: Secondary | ICD-10-CM | POA: Diagnosis not present

## 2023-04-01 DIAGNOSIS — I739 Peripheral vascular disease, unspecified: Secondary | ICD-10-CM

## 2023-04-01 DIAGNOSIS — E1122 Type 2 diabetes mellitus with diabetic chronic kidney disease: Secondary | ICD-10-CM

## 2023-04-01 DIAGNOSIS — D6869 Other thrombophilia: Secondary | ICD-10-CM | POA: Diagnosis not present

## 2023-04-01 DIAGNOSIS — N1832 Chronic kidney disease, stage 3b: Secondary | ICD-10-CM

## 2023-04-01 DIAGNOSIS — J438 Other emphysema: Secondary | ICD-10-CM | POA: Diagnosis not present

## 2023-04-01 DIAGNOSIS — M722 Plantar fascial fibromatosis: Secondary | ICD-10-CM

## 2023-04-01 DIAGNOSIS — I480 Paroxysmal atrial fibrillation: Secondary | ICD-10-CM

## 2023-04-01 LAB — POCT URINALYSIS DIPSTICK
Bilirubin, UA: NEGATIVE
Blood, UA: NEGATIVE
Glucose, UA: NEGATIVE
Ketones, UA: NEGATIVE
Leukocytes, UA: NEGATIVE
Nitrite, UA: NEGATIVE
Protein, UA: NEGATIVE
Spec Grav, UA: >=1.03 — AB (ref 1.010–1.025)
Urobilinogen, UA: 0.2 U/dL
pH, UA: 6 (ref 5.0–8.0)

## 2023-04-01 NOTE — Assessment & Plan Note (Signed)
Chronic, currently on metoprolol XL, enalapril, digoxin and furosemide. Importance of medication and dietary compliance was stressed to the patient.

## 2023-04-01 NOTE — Patient Instructions (Signed)

## 2023-04-01 NOTE — Assessment & Plan Note (Addendum)
Chronic, she has soft blood pressure today, denies dizziness. Encouraged to stay well hydrated. For now, she will continue with enalapril 5mg  daily, metoprolol XL 50mg  daily and furosemide prn.

## 2023-04-01 NOTE — Progress Notes (Signed)
I,Victoria T Deloria Lair, CMA,acting as a Neurosurgeon for Gwynneth Aliment, MD.,have documented all relevant documentation on the behalf of Gwynneth Aliment, MD,as directed by  Gwynneth Aliment, MD while in the presence of Gwynneth Aliment, MD.  Subjective:    Patient ID: [name] , female    DOB: [birth year] , 77 y.o.   MRN: [id number]  Chief Complaint  Patient presents with   Annual Exam   Diabetes   Hypertension    HPI  She is here today for a full physical examination.  She has no specific concerns or complaints at this time.  She denies headaches, chest pain and shortness of breath. She states she is feeling pretty good at this time.     Diabetes She presents for her follow-up diabetic visit. She has type 2 diabetes mellitus. Her disease course has been stable. There are no hypoglycemic associated symptoms. Pertinent negatives for diabetes include no blurred vision. There are no hypoglycemic complications. Diabetic complications include nephropathy. Risk factors for coronary artery disease include diabetes mellitus, dyslipidemia, hypertension, obesity, post-menopausal and sedentary lifestyle. Meal planning includes avoidance of concentrated sweets. She participates in exercise intermittently. An ACE inhibitor/angiotensin II receptor blocker is being taken. Eye exam is current.  Hypertension This is a chronic problem. The current episode started more than 1 year ago. The problem has been gradually improving since onset. The problem is controlled. Pertinent negatives include no blurred vision. Risk factors for coronary artery disease include diabetes mellitus, dyslipidemia, obesity, post-menopausal state and sedentary lifestyle. The current treatment provides moderate improvement. Compliance problems include exercise.      Past Medical History:  Diagnosis Date   Arthritis    "left knee" (03/12/2013)   Atrial fibrillation (HCC)    Cardioverted to NSR 08/22/2009   Atrial tachycardia (HCC)  09/30/2019   Cardiomyopathy    CHF (congestive heart failure) (HCC)    Chronic combined systolic and diastolic heart failure (HCC) 04/09/2021   CKD (chronic kidney disease)    Depression    Dizziness    DM2 (diabetes mellitus, type 2) (HCC)    fasting 80-100 (03/12/2013)   Drug therapy 1/11   coumadin   Ejection fraction < 50%    20-25%, echo.06/2009, /  EF 30-35%, diffuse hypokinesis, echo, Oct 16, 2010   GERD (gastroesophageal reflux disease)    Gout    "left knee" (03/12/2013)   History of bronchitis    History of emphysema (HCC)    HLD (hyperlipidemia)    Hyperkalemia    Hypertension    LFT elevation    06/2009   Mitral regurgitation    mild - echo - 1/11 /  Mild, echo, May15, 2012   Overweight(278.02)    Pneumonia    "@ least twice" (03/12/2013)   Thyroid dysfunction    thyromegally,diffuse,nodule LLL,06/2009   Tobacco abuse    Tricuspid regurgitation    moderate - echo - 1/11     Family History  Problem Relation Age of Onset   Stroke Mother    Prostate cancer Father    Hypertension Sister    Stroke Maternal Grandfather    Stroke Paternal Grandmother    Heart attack Son    Hypertension Son        X2   Colon cancer Other    Heart disease Other    Breast cancer Neg Hx      Current Outpatient Medications:    acetaminophen (TYLENOL) 500 MG tablet, Take 1,000 mg by mouth 3 (  three) times daily as needed for pain., Disp: , Rfl:    BD PEN NEEDLE NANO U/F 32G X 4 MM MISC, USE AS DIRECTED WITH INSULIN PENS, Disp: 100 each, Rfl: 11   betamethasone valerate ointment (VALISONE) 0.1 %, Apply 1 Application topically 2 (two) times daily. prn (Patient taking differently: Apply 1 Application topically daily as needed (Dry skin).), Disp: 30 g, Rfl: 0   Cholecalciferol (VITAMIN D) 2000 UNITS CAPS, Take 2,000 Units by mouth daily., Disp: , Rfl:    diclofenac Sodium (VOLTAREN) 1 % GEL, Apply 4 g topically 4 (four) times daily. (Patient taking differently: Apply 4 g topically 4  (four) times daily as needed (pain).), Disp: 100 g, Rfl: 0   digoxin (LANOXIN) 0.125 MG tablet, Take 1 tablet (125 mcg total) by mouth daily. Please keep your upcoming appointment for refills., Disp: 90 tablet, Rfl: 1   ELIQUIS 5 MG TABS tablet, TAKE 1 TABLET BY MOUTH TWICE DAILY, Disp: 60 tablet, Rfl: 10   enalapril (VASOTEC) 5 MG tablet, Take 1 tablet (5 mg total) by mouth at bedtime. Please keep your upcoming appointment for refills., Disp: 90 tablet, Rfl: 1   ezetimibe (ZETIA) 10 MG tablet, Take 1 tablet (10 mg total) by mouth daily., Disp: 90 tablet, Rfl: 1   furosemide (LASIX) 40 MG tablet, TAKE 1 TABLET BY MOUTH AS NEEDED FOR EDEMA OR FLUID, Disp: 30 tablet, Rfl: 10   glucose blood (ONETOUCH VERIO) test strip, USE AS DIRECTED TO CHECK BLOOD SUGARS 1 TIME PER DAY, Disp: 50 strip, Rfl: 7   KLOR-CON M20 20 MEQ tablet, TAKE 1 TABLET (20 MEQ TOTAL) BY MOUTH DAILY. WHEN TAKING FUROSEMIDE (LASIX)., Disp: 90 tablet, Rfl: 2   LIVALO 2 MG TABS, TAKE 1 TABLET BY MOUTH EVERY DAY, Disp: 90 tablet, Rfl: 2   metoprolol succinate (TOPROL-XL) 50 MG 24 hr tablet, Take 1 tablet (50 mg total) by mouth daily. Take with or immediately following a meal., Disp: 90 tablet, Rfl: 3   Multiple Vitamin (MULITIVITAMIN WITH MINERALS) TABS, Take 1 tablet by mouth daily. Centrum Silver, Disp: , Rfl:    nystatin cream (MYCOSTATIN), APPLY TO AFFECTED AREA(S) TWICE DAILY AS NEEDED FOR DRY SKIN UNDER BREASTS, Disp: 30 g, Rfl: 10   pantoprazole (PROTONIX) 20 MG tablet, TAKE 1 TABLET BY MOUTH DAILY AS NEEDED FOR HEARTBURN., Disp: 90 tablet, Rfl: 1   Semaglutide,0.25 or 0.5MG /DOS, (OZEMPIC, 0.25 OR 0.5 MG/DOSE,) 2 MG/3ML SOPN, Inject 0.5 mg into the skin once a week., Disp: 3 mL, Rfl: 2   tiZANidine (ZANAFLEX) 4 MG tablet, TAKE 1 TABLET (4 MG TOTAL) BY MOUTH DAILY AS NEEDED FOR BACK PAIN, Disp: 30 tablet, Rfl: 1   vitamin C (ASCORBIC ACID) 500 MG tablet, Take 500 mg by mouth daily., Disp: , Rfl:    Allergies  Allergen Reactions    Codeine Hives and Nausea And Vomiting   Augmentin [Amoxicillin-Pot Clavulanate] Diarrhea   Coreg [Carvedilol] Other (See Comments)    No energy   Warfarin And Related Other (See Comments)    Severe rectal bleeding      The patient states she uses post menopausal status for birth control. No LMP recorded. Patient has had a hysterectomy.. Negative for Dysmenorrhea. Negative for: breast discharge, breast lump(s), breast pain and breast self exam. Associated symptoms include abnormal vaginal bleeding. Pertinent negatives include abnormal bleeding (hematology), anxiety, decreased libido, depression, difficulty falling sleep, dyspareunia, history of infertility, nocturia, sexual dysfunction, sleep disturbances, urinary incontinence, urinary urgency, vaginal discharge and vaginal itching.  Diet regular.The patient states her exercise level is    . The patient's tobacco use is:  Social History   Tobacco Use  Smoking Status Every Day   Current packs/day: 0.50   Average packs/day: 0.5 packs/day for 40.0 years (20.0 ttl pk-yrs)   Types: Cigarettes  Smokeless Tobacco Never  Tobacco Comments   smoked 1.5ppd x 9, 1ppdx40, cut back to 1/2ppd past 5 years  . She has been exposed to passive smoke. The patient's alcohol use is:  Social History   Substance and Sexual Activity  Alcohol Use No    Review of Systems  Constitutional: Negative.   HENT: Negative.    Eyes: Negative.  Negative for blurred vision.  Respiratory: Negative.    Cardiovascular: Negative.   Gastrointestinal: Negative.   Endocrine: Negative.   Genitourinary: Negative.   Musculoskeletal: Negative.        She c/o intermittent left leg pain. States podiatrist referred her to Vascular specialist - states she needs emergent surgery to correct circulation in her leg. She does not wish to lose her leg.   Skin: Negative.   Allergic/Immunologic: Negative.   Neurological: Negative.   Hematological: Negative.   Psychiatric/Behavioral:  Negative.       Today's Vitals   04/01/23 1426  BP: 108/70  Pulse: 88  Temp: 98.1 F (36.7 C)  SpO2: 98%  Weight: 179 lb (81.2 kg)  Height: 5\' 7"  (1.702 m)   Body mass index is 28.04 kg/m.  Wt Readings from Last 3 Encounters:  04/01/23 179 lb (81.2 kg)  03/28/23 184 lb (83.5 kg)  01/23/23 183 lb 12.8 oz (83.4 kg)     Objective:  Physical Exam Vitals and nursing note reviewed.  Constitutional:      Appearance: Normal appearance.     Comments: She kept pants, shoes/socks on and sat in chair for exam  HENT:     Head: Normocephalic and atraumatic.     Right Ear: Tympanic membrane, ear canal and external ear normal.     Left Ear: Tympanic membrane, ear canal and external ear normal.     Nose: Nose normal.     Mouth/Throat:     Mouth: Mucous membranes are moist.     Pharynx: Oropharynx is clear.  Eyes:     Extraocular Movements: Extraocular movements intact.     Conjunctiva/sclera: Conjunctivae normal.     Pupils: Pupils are equal, round, and reactive to light.  Cardiovascular:     Rate and Rhythm: Normal rate and regular rhythm.     Pulses: Normal pulses.     Heart sounds: Normal heart sounds.  Pulmonary:     Effort: Pulmonary effort is normal.     Breath sounds: Normal breath sounds.  Abdominal:     General: Abdomen is flat. Bowel sounds are normal.     Palpations: Abdomen is soft.  Genitourinary:    Comments: deferred Musculoskeletal:        General: Normal range of motion.     Cervical back: Normal range of motion and neck supple.  Feet:     Comments: Not examined, she kept shoes on Skin:    General: Skin is warm and dry.  Neurological:     General: No focal deficit present.     Mental Status: She is alert and oriented to person, place, and time.  Psychiatric:        Mood and Affect: Mood normal.        Behavior: Behavior normal.  Assessment And Plan:     Encounter for general adult medical examination w/o abnormal findings Assessment &  Plan: A full exam was performed.  Importance of monthly self breast exams was discussed with the patient.  She is advised to get 30-45 minutes of regular exercise, no less than four to five days per week. Both weight-bearing and aerobic exercises are recommended.  She is advised to follow a healthy diet with at least six fruits/veggies per day, decrease intake of red meat and other saturated fats and to increase fish intake to twice weekly.  Meats/fish should not be fried -- baked, boiled or broiled is preferable. It is also important to cut back on your sugar intake.  Be sure to read labels - try to avoid anything with added sugar, high fructose corn syrup or other sweeteners.  If you must use a sweetener, you can try stevia or monkfruit.  It is also important to avoid artificially sweetened foods/beverages and diet drinks. Lastly, wear SPF 50 sunscreen on exposed skin and when in direct sunlight for an extended period of time.  Be sure to avoid fast food restaurants and aim for at least 60 ounces of water daily.       Type 2 diabetes mellitus with stage 3b chronic kidney disease, with long-term current use of insulin (HCC) Assessment & Plan: Chronic, diabetic foot exam not performed. Most recent Podiatry note reviewed.  I will check labs as below. She is now on weekly semaglutide. I DISCUSSED WITH THE PATIENT AT LENGTH REGARDING THE GOALS OF GLYCEMIC CONTROL AND POSSIBLE LONG-TERM COMPLICATIONS.  I  ALSO STRESSED THE IMPORTANCE OF COMPLIANCE WITH HOME GLUCOSE MONITORING, DIETARY RESTRICTIONS INCLUDING AVOIDANCE OF SUGARY DRINKS/PROCESSED FOODS,  ALONG WITH REGULAR EXERCISE.  I  ALSO STRESSED THE IMPORTANCE OF ANNUAL EYE EXAMS, SELF FOOT CARE AND COMPLIANCE WITH OFFICE VISITS.   Orders: -     POCT urinalysis dipstick -     Microalbumin / creatinine urine ratio -     EKG 12-Lead -     CMP14+EGFR -     Hemoglobin A1c  Hypertensive heart and renal disease with renal failure, stage 1 through stage 4 or  unspecified chronic kidney disease, with heart failure (HCC) Assessment & Plan: Chronic, she has soft blood pressure today, denies dizziness. Encouraged to stay well hydrated. For now, she will continue with enalapril 5mg  daily, metoprolol XL 50mg  daily and furosemide prn.   Orders: -     POCT urinalysis dipstick -     Microalbumin / creatinine urine ratio -     EKG 12-Lead  Chronic combined systolic and diastolic heart failure (HCC) Assessment & Plan: Chronic, currently on metoprolol XL, enalapril, digoxin and furosemide. Importance of medication and dietary compliance was stressed to the patient.    Peripheral arterial disease (HCC) Assessment & Plan: Chronic, most recent notes from Vascular reviewed. Importance of smoking cessation and increased daily activity was stressed to the patient. LDL goal is less than 70.  She is encouraged to move forward with further testing as per Vascular.    Pure hypercholesterolemia Assessment & Plan: Chronic, LDL goal is less than 70.  She will continue with Livalo 2mg  daily and ezetimibe 10mg  daily. .  She is encouraged to avoid fried foods and to incorporate more chair exercises into her daily routine.    Secondary hyperparathyroidism of renal origin (HCC)  Paroxysmal atrial fibrillation (HCC) Assessment & Plan: Chronic, rate controlled with metoprolol XL 50mg  daily and anticoagulated with Eliquis.  Secondary hypercoagulable state [facility]) Assessment & Plan: She is currently on Eliquis due to underlying PAF.    Estrogen deficiency -     DG Bone Density; Future  Other emphysema [facility]) Assessment & Plan: Emphysematous changes seen on Jan 2023 LDCT.  She has since been evaluated by Pulmonary. Importance of smoking cessation was stressed to the patient.    Immunization due -     Flu Vaccine Trivalent High Dose (Fluad)     Return make awv by phone, for 1 year HM, RESCH DEC FOR FEB DM & BPC. Patient was given opportunity to ask  questions. Patient verbalized understanding of the plan and was able to repeat key elements of the plan. All questions were answered to their satisfaction.     I, Gwynneth Aliment, MD, have reviewed all documentation for this visit. The documentation on 04/01/23 for the exam, diagnosis, procedures, and orders are all accurate and complete.

## 2023-04-01 NOTE — Assessment & Plan Note (Signed)
Chronic, diabetic foot exam not performed. Most recent Podiatry note reviewed.  I will check labs as below. She is now on weekly semaglutide. I DISCUSSED WITH THE PATIENT AT LENGTH REGARDING THE GOALS OF GLYCEMIC CONTROL AND POSSIBLE LONG-TERM COMPLICATIONS.  I  ALSO STRESSED THE IMPORTANCE OF COMPLIANCE WITH HOME GLUCOSE MONITORING, DIETARY RESTRICTIONS INCLUDING AVOIDANCE OF SUGARY DRINKS/PROCESSED FOODS,  ALONG WITH REGULAR EXERCISE.  I  ALSO STRESSED THE IMPORTANCE OF ANNUAL EYE EXAMS, SELF FOOT CARE AND COMPLIANCE WITH OFFICE VISITS.

## 2023-04-01 NOTE — Assessment & Plan Note (Signed)

## 2023-04-02 LAB — CMP14+EGFR
ALT: 14 [IU]/L (ref 0–32)
AST: 17 [IU]/L (ref 0–40)
Albumin: 4.3 g/dL (ref 3.8–4.8)
Alkaline Phosphatase: 84 [IU]/L (ref 44–121)
BUN/Creatinine Ratio: 22 (ref 12–28)
BUN: 30 mg/dL — ABNORMAL HIGH (ref 8–27)
Bilirubin Total: 0.3 mg/dL (ref 0.0–1.2)
CO2: 24 mmol/L (ref 20–29)
Calcium: 10 mg/dL (ref 8.7–10.3)
Chloride: 99 mmol/L (ref 96–106)
Creatinine, Ser: 1.39 mg/dL — ABNORMAL HIGH (ref 0.57–1.00)
Globulin, Total: 2.9 g/dL (ref 1.5–4.5)
Glucose: 90 mg/dL (ref 70–99)
Potassium: 4.7 mmol/L (ref 3.5–5.2)
Sodium: 138 mmol/L (ref 134–144)
Total Protein: 7.2 g/dL (ref 6.0–8.5)
eGFR: 39 mL/min/{1.73_m2} — ABNORMAL LOW (ref >59)

## 2023-04-02 LAB — MICROALBUMIN / CREATININE URINE RATIO
Creatinine, Urine: 156.6 mg/dL
Microalb/Creat Ratio: 11 mg/g{creat} (ref 0–29)
Microalbumin, Urine: 17 ug/mL

## 2023-04-02 LAB — HEMOGLOBIN A1C
Est. average glucose Bld gHb Est-mCnc: 123 mg/dL
Hgb A1c MFr Bld: 5.9 % — ABNORMAL HIGH (ref 4.8–5.6)

## 2023-04-08 ENCOUNTER — Ambulatory Visit: Payer: Self-pay

## 2023-04-08 NOTE — Patient Instructions (Signed)
Visit Information  Thank you for taking time to visit with me today. Please don't hesitate to contact me if I can be of assistance to you.   Following are the goals we discussed today:   Goals Addressed             This Visit's Progress    To have my left foot pain evaluated and treated   On track    Care Coordination Interventions: Evaluation of current treatment plan related to left plantar fascitis and patient's adherence to plan as established by provider Determined patient completed her scheduled follow up with Vein and Vascular for evaluation of her left foot pain  Review of patient status, including review of consultant's reports, relevant laboratory and other test results, and medications completed Determined patient was diagnosed with chronic limb threatening ischemia of the left leg with rest pain Determined patient was given the diagnosis of PAD and its risk factors including current smoking Discussed with patient MD recommendations for an angiogram and patient has agreed to having this procedure Determined patient is currently performing ankle exercises provided by her PCP with good results as evidence by her pain has decreased allowing her to improve her ability to ambulate  Reviewed and discussed with patient her next scheduled follow up with Vein and Vascular scheduled for 04/11/23 @9 :20 AM Discussed plans with patient for ongoing care coordination follow up and provided patient with direct contact information for nurse care coordinator     To improve kidney function   On track    Care Coordination Interventions: Assessed the Patient understanding of chronic kidney disease    Reinforced to patient the importance of increasing daily water intake to 48-64 oz daily unless otherwise directed  Review of patient status, including review of consultant's reports, relevant laboratory and other test results Last practice recorded BP readings:  BP Readings from Last 3 Encounters:   04/01/23 108/70  03/28/23 117/61  01/23/23 90/62   Most recent eGFR/CrCl:  Lab Results  Component Value Date   EGFR 39 (L) 04/01/2023    No components found for: "CRCL"      To keep A1c <7.0%   On track    Care Coordination Interventions: Review of patient status, including review of consultants reports, relevant laboratory and other test results, and medications completed Counseled on the importance of exercise goals with target of 150 minutes per week  Discussed plans with patient for ongoing care coordination follow up and provided patient with direct contact information for nurse care coordinator Lab Results  Component Value Date   HGBA1C 5.9 (H) 04/01/2023         Our next appointment is by telephone on 04/17/23 at 2:30 PM  Please call the care guide team at [phone] if you need to cancel or reschedule your appointment.   If you are experiencing a Mental Health or Behavioral Health Crisis or need someone to talk to, please call 1-800-273-TALK (toll free, 24 hour hotline)  Patient verbalizes understanding of instructions and care plan provided today and agrees to view in MyChart. Active MyChart status and patient understanding of how to access instructions and care plan via MyChart confirmed with patient.     Delsa Sale RN BSN CCM [place]  [facility], [facility] Nurse Care Coordinator  Direct Dial: [phone] Website: [email or web address]

## 2023-04-08 NOTE — Patient Outreach (Signed)
Care Coordination   Follow Up Visit Note   04/08/2023 Name: [name] MRN: [id number] DOB: [birth year]  [name] is a 77 y.o. year old female who sees Dorothyann Peng, MD for primary care. I spoke with  [name] by phone today.  What matters to the patients health and wellness today?   Patient would like to get relief from her left foot pain.   Goals Addressed             This Visit's Progress    To have my left foot pain evaluated and treated   On track    Care Coordination Interventions: Evaluation of current treatment plan related to left plantar fascitis and patient's adherence to plan as established by provider Determined patient completed her scheduled follow up with Vein and Vascular for evaluation of her left foot pain  Review of patient status, including review of consultant's reports, relevant laboratory and other test results, and medications completed Determined patient was diagnosed with chronic limb threatening ischemia of the left leg with rest pain Determined patient was given the diagnosis of PAD and its risk factors including current smoking Discussed with patient MD recommendations for an angiogram and patient has agreed to having this procedure Determined patient is currently performing ankle exercises provided by her PCP with good results as evidence by her pain has decreased allowing her to improve her ability to ambulate  Reviewed and discussed with patient her next scheduled follow up with Vein and Vascular scheduled for 04/11/23 @9 :20 AM Discussed plans with patient for ongoing care coordination follow up and provided patient with direct contact information for nurse care coordinator     To improve kidney function   On track    Care Coordination Interventions: Assessed the Patient understanding of chronic kidney disease    Reinforced to patient the importance of increasing daily water intake to 48-64 oz daily unless otherwise directed   Review of patient status, including review of consultant's reports, relevant laboratory and other test results Last practice recorded BP readings:  BP Readings from Last 3 Encounters:  04/01/23 108/70  03/28/23 117/61  01/23/23 90/62   Most recent eGFR/CrCl:  Lab Results  Component Value Date   EGFR 39 (L) 04/01/2023    No components found for: "CRCL"      To keep A1c <7.0%   On track    Care Coordination Interventions: Review of patient status, including review of consultants reports, relevant laboratory and other test results, and medications completed Counseled on the importance of exercise goals with target of 150 minutes per week  Discussed plans with patient for ongoing care coordination follow up and provided patient with direct contact information for nurse care coordinator Lab Results  Component Value Date   HGBA1C 5.9 (H) 04/01/2023      Interventions Today    Flowsheet Row Most Recent Value  Chronic Disease   Chronic disease during today's visit Diabetes, Other, Atrial Fibrillation (AFib), Chronic Kidney Disease/End Stage Renal Disease (ESRD)  [PAD]  General Interventions   General Interventions Discussed/Reviewed General Interventions Discussed, General Interventions Reviewed, Doctor Visits, Labs  Doctor Visits Discussed/Reviewed PCP, Doctor Visits Reviewed, Doctor Visits Discussed, Specialist  Exercise Interventions   Exercise Discussed/Reviewed Exercise Reviewed, Exercise Discussed, Physical Activity  Physical Activity Discussed/Reviewed Types of exercise, Physical Activity Reviewed, Physical Activity Discussed  Education Interventions   Education Provided Provided Education  Provided Verbal Education On Labs, When to see the doctor, Exercise, Medication, Nutrition  Labs Reviewed  Hgb A1c, Kidney Function  Nutrition Interventions   Nutrition Discussed/Reviewed Nutrition Discussed, Nutrition Reviewed, Fluid intake          SDOH assessments and  interventions completed:  No     Care Coordination Interventions:  Yes, provided   Follow up plan: Follow up call scheduled for 04/17/23 @12 :30 PM     Encounter Outcome:  Patient Visit Completed

## 2023-04-08 NOTE — Progress Notes (Signed)
Patient ID: [name], female   DOB: 07/03/1945, 77 y.o.   MRN: [id number] Follow-up telephone encounter  Patient with chronic limb threatening ischemia with rest pain of her left foot.  At her last visit 2 weeks ago we discussed the risks and benefits of angiogram and her risk of amputation in the next 5 years and she stated she would like some time to think about it.  Today she reports her foot is feeling better has been sleeping through the night. She would like to hold off on angiogram at this time.  But reports that she will call if she starts to develop any symptoms like she was having. I agree with the patient, although I explained that if she starts to develop rest pain or wounds to call and we will get scheduled for a right lower extremity angiogram which the risk and benefits were already reviewed at her prior visit.  Otherwise we will plan for a 81-month follow-up with ABI and right leg duplex    Please see note from 03/28/2023 below ______________________________________________________________________________________________    Patient ID: [name], female   DOB: 21-Jun-1945, 77 y.o.   MRN: [id number]   Referred by Dorothyann Peng, MD  Subjective:     HPI  [name] is a 77 y.o. female with A-fib on Eliquis, CHF, HTN, HLD, type 2 diabetes and CKD with a GFR of 36.  She presents today for evaluation of the left foot pain and discoloration.  She reports that since June she has had left foot pain that bothered her throughout the entire day.  She reports it is worse at night and wakes her up.  Over the last few months he states that it has been slightly better and only notices the pain at night few days per week.  She denies any history of claudication.  She denies ulceration.  She denies any previous vascular surgery interventions. She is a current smoker and smokes about half a pack per day. She does have shortness of breath with activity and wheezing.  Past  Medical History:  Diagnosis Date   Arthritis    "left knee" (03/12/2013)   Atrial fibrillation (HCC)    Cardioverted to NSR 08/22/2009   Atrial tachycardia (HCC) 09/30/2019   Cardiomyopathy    CHF (congestive heart failure) (HCC)    Chronic combined systolic and diastolic heart failure (HCC) 04/09/2021   CKD (chronic kidney disease)    Depression    Dizziness    DM2 (diabetes mellitus, type 2) (HCC)    fasting 80-100 (03/12/2013)   Drug therapy 1/11   coumadin   Ejection fraction < 50%    20-25%, echo.06/2009, /  EF 30-35%, diffuse hypokinesis, echo, Oct 16, 2010   GERD (gastroesophageal reflux disease)    Gout    "left knee" (03/12/2013)   History of bronchitis    History of emphysema (HCC)    HLD (hyperlipidemia)    Hyperkalemia    Hypertension    LFT elevation    06/2009   Mitral regurgitation    mild - echo - 1/11 /  Mild, echo, May15, 2012   Overweight(278.02)    Pneumonia    "@ least twice" (03/12/2013)   Thyroid dysfunction    thyromegally,diffuse,nodule LLL,06/2009   Tobacco abuse    Tricuspid regurgitation    moderate - echo - 1/11   Family History  Problem Relation Age of Onset   Stroke Mother    Prostate cancer Father  Hypertension Sister    Stroke Maternal Grandfather    Stroke Paternal Grandmother    Heart attack Son    Hypertension Son        X2   Colon cancer Other    Heart disease Other    Breast cancer Neg Hx    Past Surgical History:  Procedure Laterality Date   BIOPSY  09/29/2020   Procedure: BIOPSY;  Surgeon: Jeani Hawking, MD;  Location: [facility] ENDOSCOPY;  Service: Endoscopy;;   BREAST BIOPSY Right 1980's   BREAST LUMPECTOMY Right 1980's   CARDIOVERSION N/A 10/15/2019   Procedure: CARDIOVERSION;  Surgeon: Chilton Si, MD;  Location: [facility] ENDOSCOPY;  Service: Cardiovascular;  Laterality: N/A;   CATARACT EXTRACTION W/ INTRAOCULAR LENS  IMPLANT, BILATERAL Bilateral 2010-2012   CHOLECYSTECTOMY  ~ 1978   ESOPHAGOGASTRODUODENOSCOPY (EGD) WITH  PROPOFOL N/A 09/29/2020   Procedure: ESOPHAGOGASTRODUODENOSCOPY (EGD) WITH PROPOFOL;  Surgeon: Jeani Hawking, MD;  Location: [facility] ENDOSCOPY;  Service: Endoscopy;  Laterality: N/A;   JOINT REPLACEMENT     TONSILLECTOMY  1960's   TOTAL KNEE ARTHROPLASTY Left 03/12/2013   TOTAL KNEE ARTHROPLASTY Left 03/12/2013   Procedure: TOTAL KNEE ARTHROPLASTY;  Surgeon: Nadara Mustard, MD;  Location: [facility] OR;  Service: Orthopedics;  Laterality: Left;  Left total knee arthroplasty   TUBAL LIGATION     UPPER ESOPHAGEAL ENDOSCOPIC ULTRASOUND (EUS) N/A 09/29/2020   Procedure: UPPER ESOPHAGEAL ENDOSCOPIC ULTRASOUND (EUS);  Surgeon: Jeani Hawking, MD;  Location: [facility] ENDOSCOPY;  Service: Endoscopy;  Laterality: N/A;   VAGINAL HYSTERECTOMY  1987    Short Social History:  Social History   Tobacco Use   Smoking status: Every Day    Current packs/day: 0.50    Average packs/day: 0.5 packs/day for 40.0 years (20.0 ttl pk-yrs)    Types: Cigarettes   Smokeless tobacco: Never   Tobacco comments:    smoked 1.5ppd x 9, 1ppdx40, cut back to 1/2ppd past 5 years  Substance Use Topics   Alcohol use: No    Allergies  Allergen Reactions   Codeine Hives and Nausea And Vomiting   Augmentin [Amoxicillin-Pot Clavulanate] Diarrhea   Coreg [Carvedilol] Other (See Comments)    No energy   Warfarin And Related Other (See Comments)    Severe rectal bleeding    Current Outpatient Medications  Medication Sig Dispense Refill   acetaminophen (TYLENOL) 500 MG tablet Take 1,000 mg by mouth 3 (three) times daily as needed for pain.     BD PEN NEEDLE NANO U/F 32G X 4 MM MISC USE AS DIRECTED WITH INSULIN PENS 100 each 11   betamethasone valerate ointment (VALISONE) 0.1 % Apply 1 Application topically 2 (two) times daily. prn (Patient taking differently: Apply 1 Application topically daily as needed (Dry skin).) 30 g 0   Cholecalciferol (VITAMIN D) 2000 UNITS CAPS Take 2,000 Units by mouth daily.     diclofenac Sodium (VOLTAREN) 1 % GEL  Apply 4 g topically 4 (four) times daily. (Patient taking differently: Apply 4 g topically 4 (four) times daily as needed (pain).) 100 g 0   digoxin (LANOXIN) 0.125 MG tablet Take 1 tablet (125 mcg total) by mouth daily. Please keep your upcoming appointment for refills. 90 tablet 1   ELIQUIS 5 MG TABS tablet TAKE 1 TABLET BY MOUTH TWICE DAILY 60 tablet 10   enalapril (VASOTEC) 5 MG tablet Take 1 tablet (5 mg total) by mouth at bedtime. Please keep your upcoming appointment for refills. 90 tablet 1   ezetimibe (ZETIA) 10 MG tablet Take  1 tablet (10 mg total) by mouth daily. 90 tablet 1   furosemide (LASIX) 40 MG tablet TAKE 1 TABLET BY MOUTH AS NEEDED FOR EDEMA OR FLUID 30 tablet 10   glucose blood (ONETOUCH VERIO) test strip USE AS DIRECTED TO CHECK BLOOD SUGARS 1 TIME PER DAY 50 strip 7   KLOR-CON M20 20 MEQ tablet TAKE 1 TABLET (20 MEQ TOTAL) BY MOUTH DAILY. WHEN TAKING FUROSEMIDE (LASIX). 90 tablet 2   LIVALO 2 MG TABS TAKE 1 TABLET BY MOUTH EVERY DAY 90 tablet 2   metoprolol succinate (TOPROL-XL) 50 MG 24 hr tablet Take 1 tablet (50 mg total) by mouth daily. Take with or immediately following a meal. 90 tablet 3   Multiple Vitamin (MULITIVITAMIN WITH MINERALS) TABS Take 1 tablet by mouth daily. Centrum Silver     nystatin cream (MYCOSTATIN) APPLY TO AFFECTED AREA(S) TWICE DAILY AS NEEDED FOR DRY SKIN UNDER BREASTS 30 g 10   pantoprazole (PROTONIX) 20 MG tablet TAKE 1 TABLET BY MOUTH DAILY AS NEEDED FOR HEARTBURN. 90 tablet 1   Semaglutide,0.25 or 0.5MG /DOS, (OZEMPIC, 0.25 OR 0.5 MG/DOSE,) 2 MG/3ML SOPN Inject 0.5 mg into the skin once a week. 3 mL 2   tiZANidine (ZANAFLEX) 4 MG tablet TAKE 1 TABLET (4 MG TOTAL) BY MOUTH DAILY AS NEEDED FOR BACK PAIN 30 tablet 1   vitamin C (ASCORBIC ACID) 500 MG tablet Take 500 mg by mouth daily.     No current facility-administered medications for this visit.    REVIEW OF SYSTEMS  Negative other than noted in HPI     Objective:  Objective   There  were no vitals filed for this visit.  There is no height or weight on file to calculate BMI.  Physical Exam General: no acute distress Cardiac: hemodynamically stable, nontachycardic Pulm: normal work of breathing Neuro: alert, no focal deficit Extremities: no edema, dark discoloration of left forefoot and toes.  No wounds Vascular:   Right: Palpable femoral, DP  Left: Palpable femoral, nonpalpable pedal's   Data: ABI: +---------+------------------+-----+----------+--------+  Right   Rt Pressure (mmHg)IndexWaveform  Comment   +---------+------------------+-----+----------+--------+  Brachial 139                                        +---------+------------------+-----+----------+--------+  PTA     254               1.78 monophasic          +---------+------------------+-----+----------+--------+  DP      254               1.78 biphasic            +---------+------------------+-----+----------+--------+  Kerby Nora               0.82 Normal              +---------+------------------+-----+----------+--------+   +---------+------------------+-----+-------------------+-------+  Left    Lt Pressure (mmHg)IndexWaveform           Comment  +---------+------------------+-----+-------------------+-------+  Brachial 143                                                +---------+------------------+-----+-------------------+-------+  PTA     131  0.92 monophasic                  +---------+------------------+-----+-------------------+-------+  DP      254               1.78 dampened monophasic         +---------+------------------+-----+-------------------+-------+  Great Toe41                0.29 Abnormal                    +---------+------------------+-----+-------------------+-------+    Previously: Right noncompressible with TP 132        Left noncompressible with TP 49  Previous duplex on 09/10/2021  demonstrated infrapopliteal disease with possible one-vessel runoff via the AT   Assessment/Plan:     [name] is a 77 y.o. female with chronic limb threatening ischemia of the left leg with rest pain.  We had an extensive discussion regarding the natural history of PAD and its risk factors including current smoking.  We discussed the stages of PAD and given that she is currently in rest pain with a significantly low toe pressure that I would recommend an angiogram.  The risks and benefits were reviewed and she expressed understanding. We also discussed that patients with CL TI have approximately 50% risk of amputation at 5 years.  She would like to discuss with her son and her endocrinologist whom she has a good relationship with.  I encouraged smoking cessation as this is the only modifiable risk factor that will worsen arterial disease.    Plan for virtual visit in 2 weeks   Recommendations to optimize cardiovascular risk: Abstinence from all tobacco products. Blood glucose control with goal A1c < 7%. Blood pressure control with goal blood pressure < 140/90 mmHg. Lipid reduction therapy with goal LDL-C <100 mg/dL  Aspirin 81mg  PO QD.  Atorvastatin 40-80mg  PO QD (or other "high intensity" statin therapy).     Daria Pastures MD [facility]

## 2023-04-11 ENCOUNTER — Ambulatory Visit: Payer: Medicare Other | Admitting: Vascular Surgery

## 2023-04-11 DIAGNOSIS — I739 Peripheral vascular disease, unspecified: Secondary | ICD-10-CM | POA: Diagnosis not present

## 2023-04-15 NOTE — Assessment & Plan Note (Signed)
Chronic, rate controlled with metoprolol XL 50mg  daily and anticoagulated with Eliquis.

## 2023-04-15 NOTE — Assessment & Plan Note (Addendum)
Chronic, most recent notes from Vascular reviewed. Importance of smoking cessation and increased daily activity was stressed to the patient. LDL goal is less than 70.  She is encouraged to move forward with further testing as per Vascular.

## 2023-04-15 NOTE — Assessment & Plan Note (Signed)
Chronic, LDL goal is less than 70.  She will continue with Livalo 2mg  daily and ezetimibe 10mg  daily. .  She is encouraged to avoid fried foods and to incorporate more chair exercises into her daily routine.

## 2023-04-15 NOTE — Assessment & Plan Note (Signed)
Emphysematous changes seen on Jan 2023 LDCT.  She has since been evaluated by Pulmonary. Importance of smoking cessation was stressed to the patient.

## 2023-04-15 NOTE — Assessment & Plan Note (Signed)
She is currently on Eliquis due to underlying PAF.

## 2023-04-17 ENCOUNTER — Ambulatory Visit: Payer: Self-pay

## 2023-04-17 NOTE — Patient Outreach (Signed)
  Care Coordination   Follow Up Visit Note   04/17/2023 Name: [name] MRN: [id number] DOB: 02-27-1946  [name] is a 77 y.o. year old female who sees Dorothyann Peng, MD for primary care. I spoke with  [name] by phone today.  What matters to the patients health and wellness today?  Patient would like to undergo her Cardiac Ablation without complications.     Goals Addressed             This Visit's Progress    To have my left foot pain evaluated and treated   On track    Care Coordination Interventions: Evaluation of current treatment plan related to left plantar fascitis and patient's adherence to plan as established by provider Determined patient completed her scheduled follow up with Vein and Vascular for evaluation of her left foot pain  Determined patient's pain related to her PAD has improved greatly, she is able to ambulate without her cane, she is sleeping through the night Discussed with patient due her symptoms improving she will not undergo vascular surgery at this time  Discussed patient has a treatment plan in place with her vascular surgeon to contact him immediately upon any reoccurrence of her pain and or any noted ulcerations that may arise Educated patient about the importance of doing daily foot/leg inspections in order to report any changes promptly to her vascular surgeon and patient verbalizes understanding      To undergo Atrial Fibrilliation Ablation       Care Coordination Interventions: Afib action plan reviewed Reviewed and discussed with patient her upcoming scheduled Atrial Fib Ablation scheduled at [facility] INVASIVE CV LAB on 05/29/23 Determined patient will have her son and or a family member to help her with transportation and will accompany her to the appointment Discussed patient has undergone this procedure in the past, she understands what to expect and is looking forward to feeling better  Discussed plans with patient for ongoing  care coordination follow up and provided patient with direct contact information for nurse care coordinator     Interventions Today    Flowsheet Row Most Recent Value  Chronic Disease   Chronic disease during today's visit Other, Atrial Fibrillation (AFib)  [PAD]  General Interventions   General Interventions Discussed/Reviewed General Interventions Discussed, General Interventions Reviewed, Doctor Visits, Durable Medical Equipment (DME), Communication with  Doctor Visits Discussed/Reviewed Doctor Visits Discussed, Doctor Visits Reviewed, PCP, Specialist  Durable Medical Equipment (DME) Other  [cane]  Communication with Social Work  Remigio Eisenmenger BSW for emergency alert system]  Exercise Interventions   Exercise Discussed/Reviewed Exercise Discussed, Exercise Reviewed, Physical Activity  Physical Activity Discussed/Reviewed Types of exercise, Physical Activity Reviewed, Physical Activity Discussed, Home Exercise Program (HEP)  Education Interventions   Education Provided Provided Education  Provided Verbal Education On When to see the doctor, Exercise          SDOH assessments and interventions completed:  Yes  SDOH Interventions Today    Flowsheet Row Most Recent Value  SDOH Interventions   Food Insecurity Interventions AMB Referral  Remigio Eisenmenger BSW]        Care Coordination Interventions:  Yes, provided   Follow up plan: Follow up call scheduled for 06/09/23 @09 :00 AM    Encounter Outcome:  Patient Visit Completed

## 2023-04-17 NOTE — Patient Instructions (Signed)
Visit Information  Thank you for taking time to visit with me today. Please don't hesitate to contact me if I can be of assistance to you.   Following are the goals we discussed today:   Goals Addressed             This Visit's Progress    To have my left foot pain evaluated and treated   On track    Care Coordination Interventions: Evaluation of current treatment plan related to left plantar fascitis and patient's adherence to plan as established by provider Determined patient completed her scheduled follow up with Vein and Vascular for evaluation of her left foot pain  Determined patient's pain related to her PAD has improved greatly, she is able to ambulate without her cane, she is sleeping through the night Discussed with patient due her symptoms improving she will not undergo vascular surgery at this time  Discussed patient has a treatment plan in place with her vascular surgeon to contact him immediately upon any reoccurrence of her pain and or any noted ulcerations that may arise Educated patient about the importance of doing daily foot/leg inspections in order to report any changes promptly to her vascular surgeon and patient verbalizes understanding      To undergo Atrial Fibrilliation Ablation       Care Coordination Interventions: Afib action plan reviewed Reviewed and discussed with patient her upcoming scheduled Atrial Fib Ablation scheduled at [facility] INVASIVE CV LAB on 05/29/23 Determined patient will have her son and or a family member to help her with transportation and will accompany her to the appointment Discussed patient has undergone this procedure in the past, she understands what to expect and is looking forward to feeling better  Discussed plans with patient for ongoing care coordination follow up and provided patient with direct contact information for nurse care coordinator           Our next appointment is by telephone on 06/09/23 at 09:00 AM  Please call  the care guide team at [phone] if you need to cancel or reschedule your appointment.   If you are experiencing a Mental Health or Behavioral Health Crisis or need someone to talk to, please call 1-800-273-TALK (toll free, 24 hour hotline)  Patient verbalizes understanding of instructions and care plan provided today and agrees to view in MyChart. Active MyChart status and patient understanding of how to access instructions and care plan via MyChart confirmed with patient.     Delsa Sale RN BSN CCM [place]  [facility], [facility] Nurse Care Coordinator  Direct Dial: [phone] Website: [email or web address]

## 2023-04-18 ENCOUNTER — Other Ambulatory Visit: Payer: Self-pay | Admitting: Internal Medicine

## 2023-04-21 ENCOUNTER — Ambulatory Visit: Payer: Self-pay | Admitting: Licensed Clinical Social Worker

## 2023-04-21 NOTE — Patient Outreach (Signed)
  Care Coordination   Initial Visit Note   04/21/2023 Name: [name] MRN: [id number] DOB: 1945-12-28  [name] is a 77 y.o. year old female who sees Dorothyann Peng, MD for primary care. I spoke with  [name] by phone today.  What matters to the patients health and wellness today?  Concerns about losing the Life Line system due to insurance not covering it anymore and Food Insecurities.     Goals Addressed             This Visit's Progress    Care Coordination Activities       Care Coordination Interventions: Patient stated that she receives only $23 in food stamps and that she is on a fixed income and pays all her bills and once she is finished paying she barely has enough to buy and purchase extra food. Sw will mail out food pantry resources to the patient. 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 The SW instructed the patient to call [facility] to see if the covered any other Life Live programs and to also call [employer] with the numbers that were provided in the letter to talk to someone. The patient stated that she will follow up with [facility] and [employer]. 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 The Sw will follow back up with the patient on 05/04/2024 at 9:30 am.        SDOH assessments and interventions completed:  Yes  SDOH Interventions Today    Flowsheet Row Most Recent Value  SDOH Interventions   Food Insecurity Interventions Other (Comment)  [Pt receives $23 in food  stamps, Sw will mail out list of food pantyr resources]  Housing Interventions Other (Comment), Intervention Not Indicated  Transportation Interventions Intervention Not Indicated  Utilities Interventions Other (Comment), Intervention Not Indicated  [Patient still wants th list of resources mailed to her]        Care Coordination Interventions:  Yes, provided  Interventions Today    Flowsheet Row Most Recent Value  General Interventions   General Interventions Discussed/Reviewed General Interventions Discussed, Durable Medical Equipment (DME), [employer]  [Pt receives $23 in food stamps, Sw will mail out list of food pantyr resources. patient also has a life line and may have to start paying a small amout to keep it and cant because she is on a fixed income]        Follow up plan: Follow up call scheduled for 05/05/2023 at 9:30 am    Encounter Outcome:  Patient Visit Completed   Jeanie Cooks, PhD [facility], [facility] Social Worker Direct Dial: [phone]  Fax: [phone]

## 2023-04-21 NOTE — Patient Instructions (Signed)
Visit Information  Thank you for taking time to visit with me today. Please don't hesitate to contact me if I can be of assistance to you.   Following are the goals we discussed today:   Goals Addressed             This Visit's Progress    Care Coordination Activities       Care Coordination Interventions: Patient stated that she receives only $23 in food stamps and that she is on a fixed income and pays all her bills and once she is finished paying she barely has enough to buy and purchase extra food. Sw will mail out food pantry resources to the patient. 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 The SW instructed the patient to call [facility] to see if the covered any other Life Live programs and to also call Life [employer] with the numbers that were provided in the letter to talk to someone. The patient stated that she will follow up with [facility] and [facility]. 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 The Sw will follow back up with the patient on 05/04/2024 at 9:30 am.        Our next appointment is by telephone on 05/05/2023 at 9:30 am  Please call the care guide team at [phone] if you need to cancel or reschedule your appointment.   If you are experiencing a Mental Health or Behavioral Health Crisis or need someone to talk to, please call the Suicide and Crisis Lifeline: 988 go to [facility] [address], [place] [phone]) call 911  Patient verbalizes  understanding of instructions and care plan provided today and agrees to view in MyChart. Active MyChart status and patient understanding of how to access instructions and care plan via MyChart confirmed with patient.     Jeanie Cooks, PhD [facility], [facility] Social Worker Direct Dial: [phone]  Fax: [phone]

## 2023-04-24 ENCOUNTER — Telehealth: Payer: Self-pay

## 2023-04-24 NOTE — Telephone Encounter (Signed)
Pt aware of updated AF Ablation procedure time on 12/26. She will arrive at 8 am for a 10 am procedure time.

## 2023-04-25 ENCOUNTER — Other Ambulatory Visit: Payer: Self-pay

## 2023-04-25 DIAGNOSIS — I739 Peripheral vascular disease, unspecified: Secondary | ICD-10-CM

## 2023-05-05 ENCOUNTER — Ambulatory Visit: Payer: Self-pay | Admitting: Licensed Clinical Social Worker

## 2023-05-05 ENCOUNTER — Ambulatory Visit (INDEPENDENT_AMBULATORY_CARE_PROVIDER_SITE_OTHER): Payer: Medicare Other | Admitting: Podiatry

## 2023-05-05 ENCOUNTER — Other Ambulatory Visit: Payer: Self-pay | Admitting: Internal Medicine

## 2023-05-05 DIAGNOSIS — Z91199 Patient's noncompliance with other medical treatment and regimen due to unspecified reason: Secondary | ICD-10-CM

## 2023-05-05 MED ORDER — ENALAPRIL MALEATE 5 MG PO TABS: 5.0000 mg | ORAL_TABLET | Freq: Every day | ORAL | 2 refills | Status: DC

## 2023-05-05 NOTE — Patient Outreach (Signed)
  Care Coordination   Follow Up Visit Note   05/05/2023 Name: [name] MRN: [id number] DOB: 1945/11/10  [name] is a 77 y.o. year old female who sees Dorothyann Peng, MD for primary care. I spoke with  [name] by phone today.  What matters to the patients health and wellness today?  Food Resources received and Like [employer], patients son is willing to pay the monthly fee if he needs to.    Goals Addressed             This Visit's Progress    COMPLETED: Care Coordination Activities       Care Coordination Interventions: Patient stated that she receives only $23 in food stamps and that she is on a fixed income and pays all her bills and once she is finished paying she barely has enough to buy and purchase extra food. Sw will mail out food pantry resources to the patient. 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 The SW instructed the patient to call [facility] to see if the covered any other [employer] programs and to also call [facility] with the numbers that were provided in the letter to talk to someone. The patient stated that she will follow up with [facility] and [facility]. 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 The Sw will follow back up with the patient on 05/04/2024 at 9:30 am.        SDOH assessments and interventions completed:  Yes  SDOH Interventions Today    Flowsheet Row Most Recent Value  SDOH Interventions   Food Insecurity Interventions Intervention Not Indicated   [Patient received the resources]  Housing Interventions Intervention Not Indicated  Transportation Interventions Intervention Not Indicated  Utilities Interventions Intervention Not Indicated        Care Coordination Interventions:  Yes, provided  Interventions Today    Flowsheet Row Most Recent Value  General Interventions   General Interventions Discussed/Reviewed General Interventions Discussed  [Patient received the resources and family is willing to pay for Life [employer] alert if needed.]        Follow up plan: No further intervention required.   Encounter Outcome:  Patient Visit Completed   Jeanie Cooks, PhD [facility], [facility] Social Worker Direct Dial: [phone]  Fax: [phone]

## 2023-05-05 NOTE — Patient Instructions (Signed)
Visit Information  Thank you for taking time to visit with me today. Please don't hesitate to contact me if I can be of assistance to you.   Following are the goals we discussed today:   Goals Addressed             This Visit's Progress    COMPLETED: Care Coordination Activities       Care Coordination Interventions: Patient stated that she receives only $23 in food stamps and that she is on a fixed income and pays all her bills and once she is finished paying she barely has enough to buy and purchase extra food. Sw will mail out food pantry resources to the patient. 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 The SW instructed the patient to call [facility] to see if the covered any other [employer] programs and to also call [facility] with the numbers that were provided in the letter to talk to someone. The patient stated that she will follow up with [facility] and [facility]. 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 The Sw will follow back up with the patient on 05/04/2024 at 9:30 am.       No further Follow up needed   Please call the care guide team at [phone] if you need to cancel or reschedule your appointment.   If you are experiencing a Mental Health or Behavioral Health Crisis or need someone to talk to, please call the Suicide and Crisis Lifeline: 988 go to [facility] [address], [place] [phone]) call 911  Patient verbalizes understanding of  instructions and care plan provided today and agrees to view in MyChart. Active MyChart status and patient understanding of how to access instructions and care plan via MyChart confirmed with patient.     Jeanie Cooks, PhD [facility], [facility] Social Worker Direct Dial: [phone]  Fax: [phone]

## 2023-05-06 ENCOUNTER — Telehealth: Payer: Self-pay | Admitting: Cardiovascular Disease

## 2023-05-06 ENCOUNTER — Ambulatory Visit: Payer: Medicare Other | Admitting: Cardiology

## 2023-05-06 NOTE — Telephone Encounter (Signed)
Refilled digoxin as requested  Advised patient she needed an appointment for follow up, offered Ronn Melena NP but only wants to see Dr Rennie Plowman to call in January for appointment since Dr Duke Salvia has nothing through March. Patient verbalized understanding

## 2023-05-06 NOTE — Telephone Encounter (Signed)
patient is requesting to speak with Dr. Duke Salvia or nurse

## 2023-05-07 ENCOUNTER — Ambulatory Visit: Payer: Medicare Other

## 2023-05-07 ENCOUNTER — Ambulatory Visit: Payer: Self-pay | Admitting: Internal Medicine

## 2023-05-07 DIAGNOSIS — Z0000 Encounter for general adult medical examination without abnormal findings: Secondary | ICD-10-CM | POA: Diagnosis not present

## 2023-05-07 NOTE — Patient Instructions (Signed)
[name] , Thank you for taking time to come for your Medicare Wellness Visit. I appreciate your ongoing commitment to your health goals. Please review the following plan we discussed and let me know if I can assist you in the future.   Referrals/Orders/Follow-Ups/Clinician Recommendations: none  This is a list of the screening recommended for you and due dates:  Health Maintenance  Topic Date Due   Complete foot exam   03/14/2022   Screening for Lung Cancer  06/29/2022   Eye exam for diabetics  03/21/2023   COVID-19 Vaccine (8 - 2023-24 season) 05/08/2023   Colon Cancer Screening  09/15/2023   Hemoglobin A1C  09/30/2023   Yearly kidney function blood test for diabetes  03/31/2024   Yearly kidney health urinalysis for diabetes  03/31/2024   Medicare Annual Wellness Visit  05/06/2024   DTaP/Tdap/Td vaccine (2 - Td or Tdap) 07/29/2032   Pneumonia Vaccine  Completed   Flu Shot  Completed   DEXA scan (bone density measurement)  Completed   Hepatitis C Screening  Completed   Zoster (Shingles) Vaccine  Completed   HPV Vaccine  Aged Out    Advanced directives: (Copy Requested) Please bring a copy of your health care power of attorney and living will to the office to be added to your chart at your convenience.  Next Medicare Annual Wellness Visit scheduled for next year: Yes  Insert Preventive Care attachment Insert FALL PREVENTION attachment if needed

## 2023-05-07 NOTE — Progress Notes (Signed)
Subjective:   [name] is a 77 y.o. female who presents for Medicare Annual (Subsequent) preventive examination.  Visit Complete: Virtual I connected with  [name] on 05/07/23 by a audio enabled telemedicine application and verified that I am speaking with the correct person using two identifiers.  Patient Location: Home  Provider Location: Office/Clinic  I discussed the limitations of evaluation and management by telemedicine. The patient expressed understanding and agreed to proceed.  Vital Signs: Because this visit was a virtual/telehealth visit, some criteria may be missing or patient reported. Any vitals not documented were not able to be obtained and vitals that have been documented are patient reported.    Cardiac Risk Factors include: advanced age (>37men, >9 women);diabetes mellitus;hypertension     Objective:    Today's Vitals   There is no height or weight on file to calculate BMI.     05/07/2023    9:04 AM 12/16/2022   11:30 AM 04/10/2022    3:29 PM 03/21/2021   11:39 AM 09/29/2020   11:20 AM 03/02/2020    3:42 PM 11/30/2019    2:56 PM  Advanced Directives  Does Patient Have a Medical Advance Directive? Yes No Yes Yes Yes Yes Yes  Type of Estate agent of [place];Living will  Healthcare Power of [place];Living will Healthcare Power of [place];Living will Healthcare Power of [place];Living will Healthcare Power of [place];Living will Healthcare Power of [place];Living will  Copy of Healthcare Power of Attorney in Chart? No - copy requested  No - copy requested No - copy requested  No - copy requested     Current Medications (verified) Outpatient Encounter Medications as of 05/07/2023  Medication Sig   acetaminophen (TYLENOL) 500 MG tablet Take 1,000 mg by mouth 3 (three) times daily as needed for pain.   BD PEN NEEDLE NANO U/F 32G X 4 MM MISC USE AS DIRECTED WITH INSULIN PENS   betamethasone valerate ointment (VALISONE)  0.1 % Apply 1 Application topically 2 (two) times daily. prn (Patient taking differently: Apply 1 Application topically daily as needed (Dry skin).)   Cholecalciferol (VITAMIN D) 2000 UNITS CAPS Take 2,000 Units by mouth daily.   diclofenac Sodium (VOLTAREN) 1 % GEL Apply 4 g topically 4 (four) times daily. (Patient taking differently: Apply 4 g topically 4 (four) times daily as needed (pain).)   digoxin (LANOXIN) 0.125 MG tablet Take 1 tablet (125 mcg total) by mouth daily. Please keep your upcoming appointment for refills.   ELIQUIS 5 MG TABS tablet TAKE 1 TABLET BY MOUTH TWICE DAILY   enalapril (VASOTEC) 5 MG tablet Take 1 tablet (5 mg total) by mouth at bedtime. Please keep your upcoming appointment for refills.   ezetimibe (ZETIA) 10 MG tablet Take 1 tablet (10 mg total) by mouth daily.   furosemide (LASIX) 40 MG tablet TAKE 1 TABLET BY MOUTH AS NEEDED FOR EDEMA OR FLUID   glucose blood (ONETOUCH VERIO) test strip USE TO TEST BLOOD SUGAR ONCE DAILY AS DIRECTED   KLOR-CON M20 20 MEQ tablet TAKE 1 TABLET (20 MEQ TOTAL) BY MOUTH DAILY. WHEN TAKING FUROSEMIDE (LASIX).   LIVALO 2 MG TABS TAKE 1 TABLET BY MOUTH EVERY DAY   metoprolol succinate (TOPROL-XL) 50 MG 24 hr tablet Take 1 tablet (50 mg total) by mouth daily. Take with or immediately following a meal.   Multiple Vitamin (MULITIVITAMIN WITH MINERALS) TABS Take 1 tablet by mouth daily. Centrum Silver   nystatin cream (MYCOSTATIN) APPLY TO AFFECTED AREA(S)  TWICE DAILY AS NEEDED FOR DRY SKIN UNDER BREASTS   pantoprazole (PROTONIX) 20 MG tablet TAKE 1 TABLET BY MOUTH DAILY AS NEEDED FOR HEARTBURN.   Semaglutide,0.25 or 0.5MG /DOS, (OZEMPIC, 0.25 OR 0.5 MG/DOSE,) 2 MG/3ML SOPN INJECT 0.5MG  SUBCUTANEOUSLY ONCE WEEKLY   tiZANidine (ZANAFLEX) 4 MG tablet TAKE 1 TABLET (4 MG TOTAL) BY MOUTH DAILY AS NEEDED FOR BACK PAIN   vitamin C (ASCORBIC ACID) 500 MG tablet Take 500 mg by mouth daily.   No facility-administered encounter medications on file as of  05/07/2023.    Allergies (verified) Codeine, Augmentin [amoxicillin-pot clavulanate], Coreg [carvedilol], and Warfarin and related   History: Past Medical History:  Diagnosis Date   Arthritis    "left knee" (03/12/2013)   Atrial fibrillation (HCC)    Cardioverted to NSR 08/22/2009   Atrial tachycardia (HCC) 09/30/2019   Cardiomyopathy    CHF (congestive heart failure) (HCC)    Chronic combined systolic and diastolic heart failure (HCC) 04/09/2021   CKD (chronic kidney disease)    Depression    Dizziness    DM2 (diabetes mellitus, type 2) (HCC)    fasting 80-100 (03/12/2013)   Drug therapy 1/11   coumadin   Ejection fraction < 50%    20-25%, echo.06/2009, /  EF 30-35%, diffuse hypokinesis, echo, Oct 16, 2010   GERD (gastroesophageal reflux disease)    Gout    "left knee" (03/12/2013)   History of bronchitis    History of emphysema (HCC)    HLD (hyperlipidemia)    Hyperkalemia    Hypertension    LFT elevation    06/2009   Mitral regurgitation    mild - echo - 1/11 /  Mild, echo, May15, 2012   Overweight(278.02)    Pneumonia    "@ least twice" (03/12/2013)   Thyroid dysfunction    thyromegally,diffuse,nodule LLL,06/2009   Tobacco abuse    Tricuspid regurgitation    moderate - echo - 1/11   Past Surgical History:  Procedure Laterality Date   BIOPSY  09/29/2020   Procedure: BIOPSY;  Surgeon: Jeani Hawking, MD;  Location: [facility] ENDOSCOPY;  Service: Endoscopy;;   BREAST BIOPSY Right 1980's   BREAST LUMPECTOMY Right 1980's   CARDIOVERSION N/A 10/15/2019   Procedure: CARDIOVERSION;  Surgeon: Chilton Si, MD;  [facility] END[facility];  Service: Cardiovascular;  Laterality: N/A;   CATARACT EXTRACTION W/ INTRAOCULAR LENS  IMPLANT, BILATERAL Bilateral 2010-2012   CHOLECYSTECTOMY  ~ 1978   ESOPHAGOGASTRODUODENOSCOPY (EGD) WITH PROPOFOL N/A 09/29/2020   Procedure: ESOPHAGOGASTRODUODENOSCOPY (EGD) WITH PROPOFOL;  Surgeon: Jeani Hawking, MD;  Location: [facility] ENDOSCOPY;  Service:  Endoscopy;  Laterality: N/A;   JOINT REPLACEMENT     TONSILLECTOMY  1960's   TOTAL KNEE ARTHROPLASTY Left 03/12/2013   TOTAL KNEE ARTHROPLASTY Left 03/12/2013   Procedure: TOTAL KNEE ARTHROPLASTY;  Surgeon: Nadara Mustard, MD;  Location: [facility] OR;  Service: Orthopedics;  Laterality: Left;  Left total knee arthroplasty   TUBAL LIGATION     UPPER ESOPHAGEAL ENDOSCOPIC ULTRASOUND (EUS) N/A 09/29/2020   Procedure: UPPER ESOPHAGEAL ENDOSCOPIC ULTRASOUND (EUS);  Surgeon: Jeani Hawking, MD;  Location: [facility] ENDOSCOPY;  Service: Endoscopy;  Laterality: N/A;   VAGINAL HYSTERECTOMY  1987   Family History  Problem Relation Age of Onset   Stroke Mother    Prostate cancer Father    Hypertension Sister    Stroke Maternal Grandfather    Stroke Paternal Grandmother    Heart attack Son    Hypertension Son        Debera Lat  Colon cancer Other    Heart disease Other    Breast cancer Neg Hx    Social History   Socioeconomic History   Marital status: Divorced    Spouse name: Not on file   Number of children: Not on file   Years of education: Not on file   Highest education level: Not on file  Occupational History   Occupation: retired  Tobacco Use   Smoking status: Every Day    Current packs/day: 0.50    Average packs/day: 0.5 packs/day for 40.0 years (20.0 ttl pk-yrs)    Types: Cigarettes   Smokeless tobacco: Never   Tobacco comments:    smoked 1.5ppd x 9, 1ppdx40, cut back to 1/2ppd past 5 years  Vaping Use   Vaping status: Never Used  Substance and Sexual Activity   Alcohol use: No   Drug use: No   Sexual activity: Not Currently  Other Topics Concern   Not on file  Social History Narrative   Not on file   Social Determinants of Health   Financial Resource Strain: Low Risk  (05/07/2023)   Overall Financial Resource Strain (CARDIA)    Difficulty of Paying Living Expenses: Not hard at all  Food Insecurity: No Food Insecurity (05/07/2023)   Hunger Vital Sign    Worried About Running Out of  Food in the Last Year: Never true    Ran Out of Food in the Last Year: Never true  Recent Concern: Food Insecurity - Food Insecurity Present (04/21/2023)   Hunger Vital Sign    Worried About Running Out of Food in the Last Year: Sometimes true    Ran Out of Food in the Last Year: Sometimes true  Transportation Needs: No Transportation Needs (05/07/2023)   PRAPARE - Administrator, Civil Service (Medical): No    Lack of Transportation (Non-Medical): No  Physical Activity: Inactive (05/07/2023)   Exercise Vital Sign    Days of Exercise per Week: 0 days    Minutes of Exercise per Session: 0 min  Stress: No Stress Concern Present (05/07/2023)   [employer] of Occupational Health - Occupational Stress Questionnaire    Feeling of Stress : Not at all  Social Connections: Socially Isolated (05/07/2023)   Social Connection and Isolation Panel [NHANES]    Frequency of Communication with Friends and Family: More than three times a week    Frequency of Social Gatherings with Friends and Family: Not on file    Attends Religious Services: Never    Database administrator or Organizations: No    Attends Engineer, structural: Never    Marital Status: Divorced    Tobacco Counseling Ready to quit: Yes Counseling given: Not Answered Tobacco comments: smoked 1.5ppd x 9, 1ppdx40, cut back to 1/2ppd past 5 years   Clinical Intake:  Pre-visit preparation completed: Yes  Pain : No/denies pain     Nutritional Risks: None Diabetes: Yes CBG done?: No Did pt. bring in CBG monitor from home?: No  How often do you need to have someone help you when you read instructions, pamphlets, or other written materials from your doctor or pharmacy?: 1 - Never  Interpreter Needed?: No  Information entered by :: NAllen LPN   Activities of Daily Living    05/07/2023    8:58 AM  In your present state of health, do you have any difficulty performing the following activities:  Hearing?  0  Vision? 0  Difficulty concentrating or making decisions? 0  Walking or climbing stairs? 1  Dressing or bathing? 0  Doing errands, shopping? 0  Preparing Food and eating ? N  Using the Toilet? N  In the past six months, have you accidently leaked urine? Y  Do you have problems with loss of bowel control? N  Managing your Medications? N  Managing your Finances? N  Housekeeping or managing your Housekeeping? Y    Patient Care Team: Dorothyann Peng, MD as PCP - General (Internal Medicine) Chilton Si, MD as PCP - Cardiology (Cardiology) Regan Lemming, MD as PCP - Electrophysiology (Cardiology) Jeani Hawking, MD as Consulting Physician (Gastroenterology) Chalmers Guest, MD as Consulting Physician (Ophthalmology) Clarene Duke, Karma Lew, RN as [facility] Care Management  Indicate any recent Medical Services you may have received from other than [facility] providers in the past year (date may be approximate).     Assessment:   This is a routine wellness examination for [facility].  Hearing/Vision screen Hearing Screening - Comments:: Denies hearing issues Vision Screening - Comments:: No Regular eye exams, Dr. Harlon Flor   Goals Addressed             This Visit's Progress    Patient Stated       05/07/2023, get heart procedure over and done with       Depression Screen    05/07/2023    9:08 AM 07/29/2022    3:39 PM 04/10/2022    3:32 PM 11/20/2021    2:41 PM 03/21/2021   11:41 AM 03/14/2021    2:18 PM 03/02/2020    3:45 PM  PHQ 2/9 Scores  PHQ - 2 Score 0 0 0 0 0 0 0  PHQ- 9 Score 1          Fall Risk    05/07/2023    9:05 AM 01/23/2023   10:41 AM 07/29/2022    3:39 PM 04/10/2022    3:29 PM 11/20/2021    2:42 PM  Fall Risk   Falls in the past year? 1 0 0 1 1  Comment leg gave out   lost balance   Number falls in past yr: 1 0 0 0 1  Injury with Fall? 0 0 0 0 0  Risk for fall due to : Impaired mobility;Impaired balance/gait;History of fall(s);Medication side  effect No Fall Risks No Fall Risks Medication side effect;Impaired mobility History of fall(s)  Follow up Falls evaluation completed;Falls prevention discussed Falls evaluation completed Falls evaluation completed Falls evaluation completed;Education provided;Falls prevention discussed Falls evaluation completed    MEDICARE RISK AT HOME: Medicare Risk at Home Any stairs in or around the home?: Yes If so, are there any without handrails?: Yes Home free of loose throw rugs in walkways, pet beds, electrical cords, etc?: Yes Adequate lighting in your home to reduce risk of falls?: Yes Life alert?: No Use of a cane, walker or w/c?: Yes Grab bars in the bathroom?: Yes Shower chair or bench in shower?: Yes Elevated toilet seat or a handicapped toilet?: Yes  TIMED UP AND GO:  Was the test performed?  No    Cognitive Function:        05/07/2023    9:09 AM 04/10/2022    3:34 PM 03/21/2021   11:43 AM 03/02/2020    3:47 PM 02/25/2019    3:47 PM  6CIT Screen  What Year? 0 points 0 points 0 points 0 points 0 points  What month? 0 points 0 points 0 points 0 points 0 points  What time?  0 points 0 points 0 points 0 points 0 points  Count back from 20 0 points 2 points 0 points 2 points 0 points  Months in reverse 0 points 0 points 0 points 0 points 0 points  Repeat phrase 2 points 2 points 2 points 2 points 0 points  Total Score 2 points 4 points 2 points 4 points 0 points    Immunizations Immunization History  Administered Date(s) Administered   Fluad Quad(high Dose 65+) 03/07/2020, 03/14/2021, 03/25/2022   Fluad Trivalent(High Dose 65+) 04/01/2023   Influenza, High Dose Seasonal PF 04/29/2018, 03/11/2019   Influenza,inj,Quad PF,6+ Mos 03/13/2013   PFIZER Comirnaty(Gray Top)Covid-19 Tri-Sucrose Vaccine 10/27/2020   PFIZER(Purple Top)SARS-COV-2 Vaccination 07/19/2019, 08/10/2019, 03/28/2020   PNEUMOCOCCAL CONJUGATE-20 04/24/2021   Pfizer Covid-19 Vaccine Bivalent Booster 23yrs & up  04/24/2021, 04/12/2022   Pfizer(Comirnaty)Fall Seasonal Vaccine 12 years and older 03/13/2023   Pneumococcal Conjugate-13 02/28/2019   Pneumococcal-Unspecified 08/01/2011   Respiratory Syncytial Virus Vaccine,Recomb Aduvanted(Arexvy) 03/13/2023   Tdap 07/29/2022   Zoster Recombinant(Shingrix) 10/04/2013, 03/02/2019    TDAP status: Up to date  Flu Vaccine status: Up to date  Pneumococcal vaccine status: Up to date  Covid-19 vaccine status: Completed vaccines  Qualifies for Shingles Vaccine? Yes   Zostavax completed Yes   Shingrix Completed?: Yes  Screening Tests Health Maintenance  Topic Date Due   FOOT EXAM  03/14/2022   Lung Cancer Screening  06/29/2022   OPHTHALMOLOGY EXAM  03/21/2023   COVID-19 Vaccine (8 - 2023-24 season) 05/08/2023   Colonoscopy  09/15/2023   HEMOGLOBIN A1C  09/30/2023   Diabetic kidney evaluation - eGFR measurement  03/31/2024   Diabetic kidney evaluation - Urine ACR  03/31/2024   Medicare Annual Wellness (AWV)  05/06/2024   DTaP/Tdap/Td (2 - Td or Tdap) 07/29/2032   Pneumonia Vaccine 59+ Years old  Completed   INFLUENZA VACCINE  Completed   DEXA SCAN  Completed   Hepatitis C Screening  Completed   Zoster Vaccines- Shingrix  Completed   HPV VACCINES  Aged Out    Health Maintenance  Health Maintenance Due  Topic Date Due   FOOT EXAM  03/14/2022   Lung Cancer Screening  06/29/2022   OPHTHALMOLOGY EXAM  03/21/2023   COVID-19 Vaccine (8 - 2023-24 season) 05/08/2023    Colorectal cancer screening: No longer required.   Mammogram status: Completed 03/21/2022. Repeat every year  Bone Density status: Ordered 04/15/2023. Pt provided with contact info and advised to call to schedule appt.  Lung Cancer Screening: (Low Dose CT Chest recommended if Age 32-80 years, 20 pack-year currently smoking OR have quit w/in 15years.) does qualify.   Lung Cancer Screening Referral: Scheduled for 05/19/2023  Additional Screening:  Hepatitis C Screening:  does qualify; Completed 06/28/2009  Vision Screening: Recommended annual ophthalmology exams for early detection of glaucoma and other disorders of the eye. Is the patient up to date with their annual eye exam?  No  Who is the provider or what is the name of the office in which the patient attends annual eye exams? Dr. Harlon Flor If pt is not established with a provider, would they like to be referred to a provider to establish care? No .   Dental Screening: Recommended annual dental exams for proper oral hygiene  Diabetic Foot Exam: Diabetic Foot Exam: Overdue, Pt has been advised about the importance in completing this exam. Pt is scheduled for diabetic foot exam on next appointment.  Community Resource Referral / Chronic Care Management: CRR required this visit?  No   CCM required this visit?  No     Plan:     I have personally reviewed and noted the following in the patient's chart:   Medical and social history Use of alcohol, tobacco or illicit drugs  Current medications and supplements including opioid prescriptions. Patient is not currently taking opioid prescriptions. Functional ability and status Nutritional status Physical activity Advanced directives List of other physicians Hospitalizations, surgeries, and ER visits in previous 12 months Vitals Screenings to include cognitive, depression, and falls Referrals and appointments  In addition, I have reviewed and discussed with patient certain preventive protocols, quality metrics, and best practice recommendations. A written personalized care plan for preventive services as well as general preventive health recommendations were provided to patient.     Barb Merino, LPN   [id number]   After Visit Summary: (Pick Up) Due to this being a telephonic visit, with patients personalized plan was offered to patient and patient has requested to Pick up at office.  Nurse Notes: none

## 2023-05-07 NOTE — Progress Notes (Signed)
Patient was no-show for appointment today 

## 2023-05-12 ENCOUNTER — Telehealth: Payer: Self-pay | Admitting: Cardiology

## 2023-05-12 NOTE — Telephone Encounter (Signed)
Pt is aware to be ready by 1 pm, verified pt pick address [address]  Pt needs to sign wavier at check in.  Please call [employer] [facility] for pick at [phone]   05/12/2023  KHASSIDY TAFFE DOB: Dec 14, 1945 MRN: [id number]   RIDER WAIVER AND RELEASE OF LIABILITY  For the purposes of helping with transportation needs, [facility] partners with outside transportation providers (taxi companies, [employer], Catering manager.) to give [employer] patients or other approved people the choice of on-demand rides [employer]") to our buildings for non-emergency visits.  By using [employer], I, the person signing this document, on behalf of myself and/or any legal minors (in my care using the [employer]), agree:  Science writer given to me are supplied by independent, outside transportation providers who do not work for, or have any affiliation with, [employer]. [place] is not a transportation company. [facility] has no control over the quality or safety of the rides I get using [employer]. [facility] has no control over whether any outside ride will happen on time or not. [facility] gives no guarantee on the reliability, quality, safety, or availability on any rides, or that no mistakes will happen. I know and accept that traveling by vehicle (car, truck, SVU, Zenaida Niece, bus, taxi, etc.) has risks of serious injuries such as disability, being paralyzed, and death. I know and agree the risk of using [employer] is mine alone, and not [employer]. Transport Services are provided "as is" and as are available. The transportation providers are in charge for all inspections and care of the vehicles used to provide these rides. I agree not to take legal action against [facility], its agents, employees, officers, directors, representatives, insurers, attorneys, assigns, successors, subsidiaries, and affiliates at any time for any reasons related directly or indirectly to using  [employer]. I also agree not to take legal action against [facility] or its affiliates for any injury, death, or damage to property caused by or related to using [employer]. I have read this Waiver and Release of Liability, and I understand the terms used in it and their legal meaning. This Waiver is freely and voluntarily given with the understanding that my right (or any legal minors) to legal action against  relating to [employer] is knowingly given up to use these services.   I attest that I read the Ride Waiver and Release of Liability to [name], gave Ms. Marcellus the opportunity to ask questions and answered the questions asked (if any). I affirm that [name] then provided consent for assistance with transportation.     [employer]

## 2023-05-14 ENCOUNTER — Encounter: Payer: Self-pay | Admitting: Cardiology

## 2023-05-14 ENCOUNTER — Ambulatory Visit: Payer: Medicare Other | Attending: Cardiology | Admitting: Cardiology

## 2023-05-14 ENCOUNTER — Other Ambulatory Visit (HOSPITAL_COMMUNITY): Payer: Self-pay | Admitting: Cardiology

## 2023-05-14 VITALS — BP 120/78 | HR 98 | Ht 67.0 in | Wt 159.4 lb

## 2023-05-14 DIAGNOSIS — I480 Paroxysmal atrial fibrillation: Secondary | ICD-10-CM | POA: Diagnosis not present

## 2023-05-14 DIAGNOSIS — I483 Typical atrial flutter: Secondary | ICD-10-CM

## 2023-05-14 DIAGNOSIS — I5022 Chronic systolic (congestive) heart failure: Secondary | ICD-10-CM | POA: Diagnosis not present

## 2023-05-14 DIAGNOSIS — D6869 Other thrombophilia: Secondary | ICD-10-CM | POA: Diagnosis not present

## 2023-05-14 NOTE — Progress Notes (Signed)
  Electrophysiology Office Note:   Date:  05/14/2023  ID:  [name], DOB June 13, 1945, MRN [id number]  Primary Cardiologist: Chilton Si, MD Primary Heart Failure: None Electrophysiologist: Edman Lipsey Jorja Loa, MD      History of Present Illness:   [name] is a 77 y.o. female with h/o chronic systolic and diastolic heart failure, atrial fibrillation/flutter, PAD, diabetes, hyperlipidemia, hypertension.  Seen today for routine electrophysiology followup.   She has plans for ablation 05/29/2023.  Since last being seen in our clinic the patient reports doing well.  She does continue to have fatigue.  She has plans for ablation upcoming.  She has no acute questions..  she denies chest pain, palpitations, dyspnea, PND, orthopnea, nausea, vomiting, dizziness, syncope, edema, weight gain, or early satiety.   Review of systems complete and found to be negative unless listed in HPI.   EP Information / Studies Reviewed:    EKG is not ordered today. EKG from 04/01/23 reviewed which showed sinus rhythm        Risk Assessment/Calculations:    CHA2DS2-VASc Score = 6   This indicates a 9.7% annual risk of stroke. The patient's score is based upon: CHF History: 1 HTN History: 1 Diabetes History: 1 Stroke History: 0 Vascular Disease History: 0 Age Score: 2 Gender Score: 1             Physical Exam:   VS:  BP 120/78 (BP Location: Left Arm, Patient Position: Sitting, Cuff Size: Large)   Pulse 98   Ht 5\' 7"  (1.702 m)   Wt 159 lb 6.4 oz (72.3 kg)   SpO2 97%   BMI 24.97 kg/m    Wt Readings from Last 3 Encounters:  05/14/23 159 lb 6.4 oz (72.3 kg)  04/01/23 179 lb (81.2 kg)  03/28/23 184 lb (83.5 kg)     GEN: Well nourished, well developed in no acute distress NECK: No JVD; No carotid bruits CARDIAC: Regular rate and rhythm, no murmurs, rubs, gallops RESPIRATORY:  Clear to auscultation without rales, wheezing or rhonchi  ABDOMEN: Soft, non-tender,  non-distended EXTREMITIES:  No edema; No deformity   ASSESSMENT AND PLAN:    1.  Persistent atrial fibrillation/flutter: Plans for ablation 05/29/2023.  All questions were answered today.  CT scan currently pending.  2.  Chronic systolic heart failure: Ejection fraction 40 to 45%.  Plan per primary cardiology.  3.  Hypertension: Currently well-controlled  4.  Secondary hypercoagulable state: Currently on Eliquis for atrial fibrillation  Follow up with Dr. Elberta Fortis as usual post procedure  Signed, Avana Kreiser Jorja Loa, MD

## 2023-05-14 NOTE — H&P (View-Only) (Signed)
  Electrophysiology Office Note:   Date:  05/14/2023  ID:  [name], DOB June 13, 1945, MRN [id number]  Primary Cardiologist: Chilton Si, MD Primary Heart Failure: None Electrophysiologist: Edman Lipsey Jorja Loa, MD      History of Present Illness:   [name] is a 77 y.o. female with h/o chronic systolic and diastolic heart failure, atrial fibrillation/flutter, PAD, diabetes, hyperlipidemia, hypertension.  Seen today for routine electrophysiology followup.   She has plans for ablation 05/29/2023.  Since last being seen in our clinic the patient reports doing well.  She does continue to have fatigue.  She has plans for ablation upcoming.  She has no acute questions..  she denies chest pain, palpitations, dyspnea, PND, orthopnea, nausea, vomiting, dizziness, syncope, edema, weight gain, or early satiety.   Review of systems complete and found to be negative unless listed in HPI.   EP Information / Studies Reviewed:    EKG is not ordered today. EKG from 04/01/23 reviewed which showed sinus rhythm        Risk Assessment/Calculations:    CHA2DS2-VASc Score = 6   This indicates a 9.7% annual risk of stroke. The patient's score is based upon: CHF History: 1 HTN History: 1 Diabetes History: 1 Stroke History: 0 Vascular Disease History: 0 Age Score: 2 Gender Score: 1             Physical Exam:   VS:  BP 120/78 (BP Location: Left Arm, Patient Position: Sitting, Cuff Size: Large)   Pulse 98   Ht 5\' 7"  (1.702 m)   Wt 159 lb 6.4 oz (72.3 kg)   SpO2 97%   BMI 24.97 kg/m    Wt Readings from Last 3 Encounters:  05/14/23 159 lb 6.4 oz (72.3 kg)  04/01/23 179 lb (81.2 kg)  03/28/23 184 lb (83.5 kg)     GEN: Well nourished, well developed in no acute distress NECK: No JVD; No carotid bruits CARDIAC: Regular rate and rhythm, no murmurs, rubs, gallops RESPIRATORY:  Clear to auscultation without rales, wheezing or rhonchi  ABDOMEN: Soft, non-tender,  non-distended EXTREMITIES:  No edema; No deformity   ASSESSMENT AND PLAN:    1.  Persistent atrial fibrillation/flutter: Plans for ablation 05/29/2023.  All questions were answered today.  CT scan currently pending.  2.  Chronic systolic heart failure: Ejection fraction 40 to 45%.  Plan per primary cardiology.  3.  Hypertension: Currently well-controlled  4.  Secondary hypercoagulable state: Currently on Eliquis for atrial fibrillation  Follow up with Dr. Elberta Fortis as usual post procedure  Signed, Avana Kreiser Jorja Loa, MD

## 2023-05-15 LAB — BASIC METABOLIC PANEL
BUN/Creatinine Ratio: 18 (ref 12–28)
BUN: 33 mg/dL — ABNORMAL HIGH (ref 8–27)
CO2: 25 mmol/L (ref 20–29)
Calcium: 10.1 mg/dL (ref 8.7–10.3)
Chloride: 98 mmol/L (ref 96–106)
Creatinine, Ser: 1.8 mg/dL — ABNORMAL HIGH (ref 0.57–1.00)
Glucose: 107 mg/dL — ABNORMAL HIGH (ref 70–99)
Potassium: 4.1 mmol/L (ref 3.5–5.2)
Sodium: 140 mmol/L (ref 134–144)
eGFR: 28 mL/min/{1.73_m2} — ABNORMAL LOW (ref >59)

## 2023-05-15 LAB — CBC
Hematocrit: 41 % (ref 34.0–46.6)
Hemoglobin: 12.7 g/dL (ref 11.1–15.9)
MCH: 26.3 pg — ABNORMAL LOW (ref 26.6–33.0)
MCHC: 31 g/dL — ABNORMAL LOW (ref 31.5–35.7)
MCV: 85 fL (ref 79–97)
Platelets: 239 10*3/uL (ref 150–450)
RBC: 4.82 x10E6/uL (ref 3.77–5.28)
RDW: 13.5 % (ref 11.7–15.4)
WBC: 9.2 10*3/uL (ref 3.4–10.8)

## 2023-05-19 ENCOUNTER — Ambulatory Visit (HOSPITAL_COMMUNITY): Admission: RE | Admit: 2023-05-19 | Payer: Medicare Other | Source: Ambulatory Visit

## 2023-05-19 ENCOUNTER — Telehealth: Payer: Self-pay | Admitting: Cardiology

## 2023-05-19 NOTE — Telephone Encounter (Signed)
Patient states she went for CT but they cancelled it due to her lab results. She states she is very concerned and would like to discuss lab results soon.

## 2023-05-20 NOTE — Telephone Encounter (Signed)
Returned pt call.  Explained that her GFR was too low to proceed with CT and that she has been set up for TEE on day of procedure.  Patient understands and appreciates the return call.

## 2023-05-26 NOTE — Pre-Procedure Instructions (Signed)
Instructed patient on the following items: Arrival time 0800 Nothing to eat or drink after midnight No meds AM of procedure Responsible person to drive you home and stay with you for 24 hrs  Have you missed any doses of anti-coagulant Eliquis- takes twice a day, hasn't missed any doses.  Don't take dose am of procedure.

## 2023-05-29 ENCOUNTER — Other Ambulatory Visit (HOSPITAL_COMMUNITY): Payer: Medicare Other

## 2023-05-29 ENCOUNTER — Ambulatory Visit (HOSPITAL_BASED_OUTPATIENT_CLINIC_OR_DEPARTMENT_OTHER): Payer: Medicare Other | Admitting: Registered Nurse

## 2023-05-29 ENCOUNTER — Ambulatory Visit (HOSPITAL_COMMUNITY)
Admission: RE | Admit: 2023-05-29 | Discharge: 2023-05-29 | Disposition: A | Discharge status: Home / Self Care | Payer: Medicare Other | Attending: Cardiology | Admitting: Cardiology

## 2023-05-29 ENCOUNTER — Ambulatory Visit (HOSPITAL_COMMUNITY): Payer: Medicare Other | Admitting: Registered Nurse

## 2023-05-29 ENCOUNTER — Other Ambulatory Visit: Payer: Self-pay

## 2023-05-29 ENCOUNTER — Ambulatory Visit (HOSPITAL_COMMUNITY)
Admission: RE | Disposition: A | Discharge status: Home / Self Care | Payer: Medicare Other | Source: Home / Self Care | Attending: Cardiology

## 2023-05-29 DIAGNOSIS — I4819 Other persistent atrial fibrillation: Secondary | ICD-10-CM | POA: Diagnosis not present

## 2023-05-29 DIAGNOSIS — E1151 Type 2 diabetes mellitus with diabetic peripheral angiopathy without gangrene: Secondary | ICD-10-CM | POA: Insufficient documentation

## 2023-05-29 DIAGNOSIS — I110 Hypertensive heart disease with heart failure: Secondary | ICD-10-CM | POA: Insufficient documentation

## 2023-05-29 DIAGNOSIS — E785 Hyperlipidemia, unspecified: Secondary | ICD-10-CM | POA: Insufficient documentation

## 2023-05-29 DIAGNOSIS — R5383 Other fatigue: Secondary | ICD-10-CM | POA: Insufficient documentation

## 2023-05-29 DIAGNOSIS — I480 Paroxysmal atrial fibrillation: Secondary | ICD-10-CM

## 2023-05-29 DIAGNOSIS — F17210 Nicotine dependence, cigarettes, uncomplicated: Secondary | ICD-10-CM

## 2023-05-29 DIAGNOSIS — Z79899 Other long term (current) drug therapy: Secondary | ICD-10-CM | POA: Diagnosis not present

## 2023-05-29 DIAGNOSIS — Z7901 Long term (current) use of anticoagulants: Secondary | ICD-10-CM | POA: Insufficient documentation

## 2023-05-29 DIAGNOSIS — I4891 Unspecified atrial fibrillation: Secondary | ICD-10-CM

## 2023-05-29 DIAGNOSIS — N189 Chronic kidney disease, unspecified: Secondary | ICD-10-CM

## 2023-05-29 DIAGNOSIS — I130 Hypertensive heart and chronic kidney disease with heart failure and stage 1 through stage 4 chronic kidney disease, or unspecified chronic kidney disease: Secondary | ICD-10-CM | POA: Diagnosis not present

## 2023-05-29 DIAGNOSIS — K219 Gastro-esophageal reflux disease without esophagitis: Secondary | ICD-10-CM | POA: Insufficient documentation

## 2023-05-29 DIAGNOSIS — Z7984 Long term (current) use of oral hypoglycemic drugs: Secondary | ICD-10-CM | POA: Diagnosis not present

## 2023-05-29 DIAGNOSIS — D6869 Other thrombophilia: Secondary | ICD-10-CM | POA: Insufficient documentation

## 2023-05-29 DIAGNOSIS — I4892 Unspecified atrial flutter: Secondary | ICD-10-CM | POA: Diagnosis not present

## 2023-05-29 DIAGNOSIS — I5042 Chronic combined systolic (congestive) and diastolic (congestive) heart failure: Secondary | ICD-10-CM | POA: Diagnosis not present

## 2023-05-29 DIAGNOSIS — I509 Heart failure, unspecified: Secondary | ICD-10-CM

## 2023-05-29 HISTORY — PX: ATRIAL FIBRILLATION ABLATION: EP1191

## 2023-05-29 LAB — POCT ACTIVATED CLOTTING TIME: Activated Clotting Time: 331 s

## 2023-05-29 LAB — GLUCOSE, CAPILLARY
Glucose-Capillary: 103 mg/dL — ABNORMAL HIGH (ref 70–99)
Glucose-Capillary: 97 mg/dL (ref 70–99)
Glucose-Capillary: 107 mg/dL — ABNORMAL HIGH (ref 70–99)

## 2023-05-29 SURGERY — ATRIAL FIBRILLATION ABLATION
Anesthesia: General

## 2023-05-29 MED ORDER — HEPARIN SODIUM (PORCINE) 1000 UNIT/ML IJ SOLN
INTRAMUSCULAR | Status: DC | PRN
  Administered 2023-05-29: 14000 [IU] via INTRAVENOUS

## 2023-05-29 MED ORDER — FENTANYL CITRATE (PF) 100 MCG/2ML IJ SOLN
INTRAMUSCULAR | Status: AC
  Filled 2023-05-29: qty 2

## 2023-05-29 MED ORDER — ACETAMINOPHEN 325 MG PO TABS: 650.0000 mg | ORAL_TABLET | ORAL | Status: DC | PRN

## 2023-05-29 MED ORDER — LIDOCAINE 2% (20 MG/ML) 5 ML SYRINGE
INTRAMUSCULAR | Status: DC | PRN
  Administered 2023-05-29: 100 mg via INTRAVENOUS

## 2023-05-29 MED ORDER — SODIUM CHLORIDE 0.9 % IV SOLN: 250.0000 mL | INTRAVENOUS | Status: DC | PRN

## 2023-05-29 MED ORDER — ONDANSETRON HCL 4 MG/2ML IJ SOLN: 4.0000 mg | Freq: Four times a day (QID) | INTRAMUSCULAR | Status: DC | PRN

## 2023-05-29 MED ORDER — HEPARIN SODIUM (PORCINE) 1000 UNIT/ML IJ SOLN
INTRAMUSCULAR | Status: AC
  Filled 2023-05-29: qty 10

## 2023-05-29 MED ORDER — ROCURONIUM BROMIDE 10 MG/ML (PF) SYRINGE
PREFILLED_SYRINGE | INTRAVENOUS | Status: DC | PRN
  Administered 2023-05-29: 70 mg via INTRAVENOUS

## 2023-05-29 MED ORDER — ATROPINE SULFATE 1 MG/ML IV SOLN
INTRAVENOUS | Status: DC | PRN
  Administered 2023-05-29: 1 mg via INTRAVENOUS

## 2023-05-29 MED ORDER — SODIUM CHLORIDE 0.9 % IV SOLN: INTRAVENOUS | Status: DC

## 2023-05-29 MED ORDER — PHENYLEPHRINE 80 MCG/ML (10ML) SYRINGE FOR IV PUSH (FOR BLOOD PRESSURE SUPPORT)
PREFILLED_SYRINGE | INTRAVENOUS | Status: DC | PRN
  Administered 2023-05-29 (×4): 160 ug via INTRAVENOUS

## 2023-05-29 MED ORDER — HEPARIN (PORCINE) IN NACL 1000-0.9 UT/500ML-% IV SOLN
INTRAVENOUS | Status: DC | PRN
  Administered 2023-05-29 (×3): 500 mL

## 2023-05-29 MED ORDER — SUGAMMADEX SODIUM 200 MG/2ML IV SOLN
INTRAVENOUS | Status: DC | PRN
  Administered 2023-05-29: 200 mg via INTRAVENOUS

## 2023-05-29 MED ORDER — ATROPINE SULFATE 1 MG/10ML IJ SOSY
PREFILLED_SYRINGE | INTRAMUSCULAR | Status: AC
Start: 2023-05-29 — End: ?
  Filled 2023-05-29: qty 10

## 2023-05-29 MED ORDER — PROTAMINE SULFATE 10 MG/ML IV SOLN
INTRAVENOUS | Status: DC | PRN
  Administered 2023-05-29: 20 mg via INTRAVENOUS
  Administered 2023-05-29: 40 mg via INTRAVENOUS

## 2023-05-29 MED ORDER — ONDANSETRON HCL 4 MG/2ML IJ SOLN
INTRAMUSCULAR | Status: DC | PRN
  Administered 2023-05-29: 4 mg via INTRAVENOUS

## 2023-05-29 MED ORDER — PROPOFOL 10 MG/ML IV BOLUS
INTRAVENOUS | Status: DC | PRN
  Administered 2023-05-29: 100 mg via INTRAVENOUS

## 2023-05-29 MED ORDER — FENTANYL CITRATE (PF) 250 MCG/5ML IJ SOLN
INTRAMUSCULAR | Status: DC | PRN
  Administered 2023-05-29: 100 ug via INTRAVENOUS

## 2023-05-29 SURGICAL SUPPLY — 19 items
BLANKET WARM UNDERBOD FULL ACC (MISCELLANEOUS) ×1 IMPLANT
CABLE PFA RX CATH CONN (CABLE) IMPLANT
CATH FARAWAVE ABLATION 31 (CATHETERS) IMPLANT
CATH OCTARAY 2.0 F 3-3-3-3-3 (CATHETERS) IMPLANT
CATH SOUNDSTAR ECO 8FR (CATHETERS) IMPLANT
CATH WEBSTER BI DIR CS D-F CRV (CATHETERS) IMPLANT
CLOSURE MYNX CONTROL 6F/7F (Vascular Products) IMPLANT
CLOSURE PERCLOSE PROSTYLE (VASCULAR PRODUCTS) IMPLANT
COVER SWIFTLINK CONNECTOR (BAG) ×1 IMPLANT
DILATOR VESSEL 38 20CM 16FR (INTRODUCER) IMPLANT
GUIDEWIRE INQWIRE 1.5J.035X260 (WIRE) IMPLANT
INQWIRE 1.5J .035X260CM (WIRE) ×1
KIT VERSACROSS CNCT FARADRIVE (KITS) IMPLANT
PACK EP LF (CUSTOM PROCEDURE TRAY) ×1 IMPLANT
PAD DEFIB RADIO PHYSIO CONN (PAD) ×1 IMPLANT
PATCH CARTO3 (PAD) IMPLANT
SHEATH FARADRIVE STEERABLE (SHEATH) IMPLANT
SHEATH PINNACLE 8F 10CM (SHEATH) IMPLANT
SHEATH PINNACLE 9F 10CM (SHEATH) IMPLANT

## 2023-05-29 NOTE — Anesthesia Preprocedure Evaluation (Addendum)
Anesthesia Evaluation  Patient identified by MRN, date of birth, ID band Patient awake    Reviewed: Allergy & Precautions, H&P , NPO status , Patient's Chart, lab work & pertinent test results  Airway Mallampati: II  TM Distance: >3 FB Neck ROM: Full    Dental no notable dental hx. (+) Upper Dentures, Lower Dentures, Dental Advisory Given   Pulmonary COPD, Current Smoker and Patient abstained from smoking.   Pulmonary exam normal breath sounds clear to auscultation       Cardiovascular hypertension, Pt. on medications and Pt. on home beta blockers + Peripheral Vascular Disease and +CHF  + dysrhythmias Atrial Fibrillation  Rhythm:Irregular Rate:Normal     Neuro/Psych    Depression    negative neurological ROS     GI/Hepatic Neg liver ROS,GERD  Medicated,,  Endo/Other  diabetes, Type 2, Oral Hypoglycemic Agents    Renal/GU Renal InsufficiencyRenal disease  negative genitourinary   Musculoskeletal  (+) Arthritis , Osteoarthritis,    Abdominal   Peds  Hematology negative hematology ROS (+)   Anesthesia Other Findings   Reproductive/Obstetrics negative OB ROS                             Anesthesia Physical Anesthesia Plan  ASA: 3  Anesthesia Plan: General   Post-op Pain Management: Tylenol PO (pre-op)*   Induction: Intravenous  PONV Risk Score and Plan: 2 and Ondansetron and Dexamethasone  Airway Management Planned: Oral ETT  Additional Equipment:   Intra-op Plan:   Post-operative Plan: Extubation in OR  Informed Consent: I have reviewed the patients History and Physical, chart, labs and discussed the procedure including the risks, benefits and alternatives for the proposed anesthesia with the patient or authorized representative who has indicated his/her understanding and acceptance.     Dental advisory given  Plan Discussed with: CRNA  Anesthesia Plan Comments:         Anesthesia Quick Evaluation

## 2023-05-29 NOTE — Discharge Instructions (Addendum)
Cardiac Ablation, Care After  This sheet gives you information about how to care for yourself after your procedure. Your health care provider may also give you more specific instructions. If you have problems or questions, contact your health care provider. What can I expect after the procedure? After the procedure, it is common to have: Bruising around your puncture site. Tenderness around your puncture site. Skipped heartbeats. If you had an atrial fibrillation ablation, you may have atrial fibrillation during the first several months after your procedure.  Tiredness (fatigue).  Follow these instructions at home: Puncture site care  Follow instructions from your health care provider about how to take care of your puncture site. Make sure you: Remove bandages tomorrow (05/30/2023) at 1:00 pm Check your puncture site every day for signs of infection. Check for: Redness, swelling, or pain. Fluid or blood. If your puncture site starts to bleed, lie down on your back, apply firm pressure to the area, and contact your health care provider. Warmth. Pus or a bad smell. A pea or small marble sized lump at the site is normal and can take up to three months to resolve.  Driving Do not drive for at least 4 days after your procedure or however long your health care provider recommends. (Do not resume driving if you have previously been instructed not to drive for other health reasons.) Do not drive or use heavy machinery while taking prescription pain medicine. Activity Avoid activities that take a lot of effort for at least 7 days after your procedure. Do not lift anything that is heavier than 5 lb (4.5 kg) for one week.  No sexual activity for 1 week.  Return to your normal activities as told by your health care provider. Ask your health care provider what activities are safe for you. General instructions Take over-the-counter and prescription medicines only as told by your health care  provider. Do not use any products that contain nicotine or tobacco, such as cigarettes and e-cigarettes. If you need help quitting, ask your health care provider. You may shower after 24 hours, but Do not take baths, swim, or use a hot tub for 1 week.  Do not drink alcohol for 24 hours after your procedure. Keep all follow-up visits as told by your health care provider. This is important. Contact a health care provider if: You have redness, mild swelling, or pain around your puncture site. You have fluid or blood coming from your puncture site that stops after applying firm pressure to the area. Your puncture site feels warm to the touch. You have pus or a bad smell coming from your puncture site. You have a fever. You have chest pain or discomfort that spreads to your neck, jaw, or arm. You have chest pain that is worse with lying on your back or taking a deep breath. You are sweating a lot. You feel nauseous. You have a fast or irregular heartbeat. You have shortness of breath. You are dizzy or light-headed and feel the need to lie down. You have pain or numbness in the arm or leg closest to your puncture site. Get help right away if: Your puncture site suddenly swells. Lay flay, apply pressure for for 15 minutes. CALL 911 Your puncture site is bleeding and the bleeding does not stop after applying firm pressure to the area for 15 minutes. If the bleeding does not stop CALL 911 These symptoms may represent a serious problem that is an emergency. Do not wait to see if  the symptoms will go away. Get medical help right away. Call your local emergency services (911 in the [place] Do not drive yourself to the hospital. Summary After the procedure, it is normal to have bruising and tenderness at the puncture site in your groin, neck, or forearm. Check your puncture site every day for signs of infection. Get help right away if your puncture site is bleeding and the bleeding does not stop after  applying firm pressure to the area. This is a medical emergency. This information is not intended to replace advice given to you by your health care provider. Make sure you discuss any questions you have with your health care provider.

## 2023-05-29 NOTE — Anesthesia Procedure Notes (Signed)

## 2023-05-29 NOTE — Anesthesia Postprocedure Evaluation (Signed)
Anesthesia Post Note  Patient: [name]  Procedure(s) Performed: ATRIAL FIBRILLATION ABLATION     Patient location during evaluation: Cath Lab Anesthesia Type: General Level of consciousness: awake and alert Pain management: pain level controlled Vital Signs Assessment: post-procedure vital signs reviewed and stable Respiratory status: spontaneous breathing, nonlabored ventilation, respiratory function stable and patient connected to nasal cannula oxygen Cardiovascular status: blood pressure returned to baseline and stable Postop Assessment: no apparent nausea or vomiting Anesthetic complications: no  There were no known notable events for this encounter.  Last Vitals:  Vitals:   05/29/23 1230 05/29/23 1245  BP: (!) 143/75 126/69  Pulse: 68 69  Resp: 15 16  Temp:    SpO2: 96% 96%    Last Pain:  Vitals:   05/29/23 1140  TempSrc: Temporal  PainSc: 0-No pain                 Mistey Hoffert,W. EDMOND

## 2023-05-29 NOTE — Transfer of Care (Signed)
Immediate Anesthesia Transfer of Care Note  Patient: [name]  Procedure(s) Performed: ATRIAL FIBRILLATION ABLATION  Patient Location: PACU  Anesthesia Type:General  Level of Consciousness: awake, alert , and oriented  Airway & Oxygen Therapy: Patient connected to nasal cannula oxygen  Post-op Assessment: Report given to RN and Post -op Vital signs reviewed and stable  Post vital signs: Reviewed and stable  Last Vitals:  Vitals Value Taken Time  BP    Temp    Pulse    Resp    SpO2      Last Pain:  Vitals:   05/29/23 0809  TempSrc: Oral  PainSc: 0-No pain         Complications: There were no known notable events for this encounter.

## 2023-05-29 NOTE — Interval H&P Note (Signed)
History and Physical Interval Note:  05/29/2023 8:54 AM  [name]  has presented today for surgery, with the diagnosis of afib.  The various methods of treatment have been discussed with the patient and family. After consideration of risks, benefits and other options for treatment, the patient has consented to  Procedure(s): ATRIAL FIBRILLATION ABLATION (N/A) TRANSESOPHAGEAL ECHOCARDIOGRAM (N/A) as a surgical intervention.  The patient's history has been reviewed, patient examined, no change in status, stable for surgery.  I have reviewed the patient's chart and labs.  Questions were answered to the patient's satisfaction.     Chanae Gemma [employer]

## 2023-05-30 ENCOUNTER — Encounter (HOSPITAL_COMMUNITY): Payer: Self-pay | Admitting: Cardiology

## 2023-05-30 MED FILL — Fentanyl Citrate Preservative Free (PF) Inj 100 MCG/2ML: INTRAMUSCULAR | Qty: 2 | Status: AC

## 2023-06-09 ENCOUNTER — Ambulatory Visit: Payer: Self-pay

## 2023-06-09 NOTE — Patient Instructions (Signed)
 Visit Information  Thank you for taking time to visit with me today. Please don't hesitate to contact me if I can be of assistance to you.   Following are the goals we discussed today:   Goals Addressed             This Visit's Progress    To undergo Atrial Fibrilliation Ablation   On track    Care Coordination Interventions: Evaluation of current treatment plan related to Atrial Fibrillation  and patient's adherence to plan as established by provider Discussed with patient she underwent a successful Cardiac Ablation on 05/29/23 Determined patient remains to be in normal sinus rhythm, patient reports improved ability to ambulate within her home with less fatigue and shortness of breath  Reviewed medications with patient and discussed importance of medication adherence Afib action plan reviewed Reviewed and discussed patient next scheduled follow up with the Afib clinic on 06/26/23 @11 :30 AM Discussed plans with patient for ongoing care coordination follow up and provided patient with direct contact information for nurse care coordinator           Our next appointment is by telephone on 07/15/23 at 09:30 AM  Please call the care guide team at [phone] if you need to cancel or reschedule your appointment.   If you are experiencing a Mental Health or Behavioral Health Crisis or need someone to talk to, please call 1-800-273-TALK (toll free, 24 hour hotline)  Patient verbalizes understanding of instructions and care plan provided today and agrees to view in MyChart. Active MyChart status and patient understanding of how to access instructions and care plan via MyChart confirmed with patient.     Clayborne Ly RN BSN CCM [place]  [facility], [facility] Nurse Care Coordinator  Direct Dial: [phone] Website: [email or web address]

## 2023-06-09 NOTE — Patient Outreach (Signed)
  Care Coordination   Follow Up Visit Note   06/09/2023 Name: RAMSHA LONIGRO MRN: [id number] DOB: 11/25/1945  EDELYN HEIDEL is a 78 y.o. year old female who sees Jarold Medici, MD for primary care. I spoke with  Ronal LITTIE Bend by phone today.  What matters to the patients health and wellness today?  Patient would like to continue to have less fatigue and shortness of breath following her Cardiac Ablation.     Goals Addressed             This Visit's Progress    To undergo Atrial Fibrilliation Ablation   On track    Care Coordination Interventions: Evaluation of current treatment plan related to Atrial Fibrillation  and patient's adherence to plan as established by provider Discussed with patient she underwent a successful Cardiac Ablation on 05/29/23 Determined patient remains to be in normal sinus rhythm, patient reports improved ability to ambulate within her home with less fatigue and shortness of breath  Reviewed medications with patient and discussed importance of medication adherence Afib action plan reviewed Reviewed and discussed patient next scheduled follow up with the Afib clinic on 06/26/23 @11 :30 AM Discussed plans with patient for ongoing care coordination follow up and provided patient with direct contact information for nurse care coordinator     Interventions Today    Flowsheet Row Most Recent Value  Chronic Disease   Chronic disease during today's visit Atrial Fibrillation (AFib), Other  [right foot pain]  General Interventions   General Interventions Discussed/Reviewed General Interventions Discussed, General Interventions Reviewed, Doctor Visits  Doctor Visits Discussed/Reviewed Doctor Visits Discussed, Doctor Visits Reviewed, Specialist, PCP  Exercise Interventions   Exercise Discussed/Reviewed Physical Activity  Physical Activity Discussed/Reviewed Physical Activity Reviewed, Physical Activity Discussed  Education Interventions   Education Provided  Provided Education  Provided Verbal Education On When to see the doctor, Medication  Pharmacy Interventions   Pharmacy Dicussed/Reviewed Pharmacy Topics Reviewed, Pharmacy Topics Discussed          SDOH assessments and interventions completed:  No     Care Coordination Interventions:  Yes, provided   Follow up plan: Follow up call scheduled for 07/15/23 @09 :30 AM    Encounter Outcome:  Patient Visit Completed

## 2023-06-10 ENCOUNTER — Encounter: Payer: Self-pay | Admitting: Cardiovascular Disease

## 2023-06-10 NOTE — Telephone Encounter (Signed)
 Error

## 2023-06-19 ENCOUNTER — Telehealth: Payer: Self-pay | Admitting: Cardiovascular Disease

## 2023-06-19 ENCOUNTER — Other Ambulatory Visit: Payer: Self-pay

## 2023-06-19 DIAGNOSIS — I480 Paroxysmal atrial fibrillation: Secondary | ICD-10-CM

## 2023-06-19 DIAGNOSIS — I4719 Other supraventricular tachycardia: Secondary | ICD-10-CM

## 2023-06-19 MED ORDER — DIGOXIN 125 MCG PO TABS: 125.0000 ug | ORAL_TABLET | Freq: Every day | ORAL | 0 refills | Status: DC

## 2023-06-19 NOTE — Telephone Encounter (Signed)
Spoke with pt, refills discussed and completed per patient request. She wanted to let dr Duke Salvia know her son is having CABG x3 next week, Casimiro Needle Harold, she wants dr Duke Salvia to take him back (06/14/69). Will make dr Duke Salvia aware.

## 2023-06-19 NOTE — Telephone Encounter (Signed)
Pt c/o medication issue:  1. Name of Medication: digoxin (LANOXIN) 0.125 MG tablet   2. How are you currently taking this medication (dosage and times per day)?  Take 1 tablet (125 mcg total) by mouth daily. Please keep your upcoming appointment for refills.     3. Are you having a reaction (difficulty breathing--STAT)? No  4. What is your medication issue? Pt is requesting a callback regarding it being very difficult for her to get this medication so she'd like to know if this is something she should still be taking or not since her refill request was denied. Please advise

## 2023-06-20 ENCOUNTER — Encounter (HOSPITAL_BASED_OUTPATIENT_CLINIC_OR_DEPARTMENT_OTHER): Payer: Self-pay | Admitting: *Deleted

## 2023-06-20 ENCOUNTER — Other Ambulatory Visit: Payer: Self-pay | Admitting: Internal Medicine

## 2023-06-20 NOTE — Telephone Encounter (Signed)
Dr Duke Salvia reviewed message Patients son followed by HF clinic now. However, if HF clinic recommends her following up she will be happy to do so.

## 2023-06-26 ENCOUNTER — Ambulatory Visit (HOSPITAL_COMMUNITY): Payer: Medicare Other | Admitting: Internal Medicine

## 2023-07-09 ENCOUNTER — Ambulatory Visit: Payer: Medicare Other | Admitting: Internal Medicine

## 2023-07-11 ENCOUNTER — Ambulatory Visit: Payer: Medicare Other | Admitting: Vascular Surgery

## 2023-07-11 ENCOUNTER — Encounter (HOSPITAL_COMMUNITY): Payer: Medicare Other

## 2023-07-15 ENCOUNTER — Ambulatory Visit: Payer: Self-pay

## 2023-07-15 NOTE — Patient Outreach (Signed)
  Care Coordination   07/15/2023 Name: ANAPAOLA KINSEL MRN: [id number] DOB: 1945/10/02   Care Coordination Outreach Attempts:  An unsuccessful outreach was attempted for an appointment today.  Follow Up Plan:  Additional outreach attempts will be made to offer the patient complex care management information and services.   Encounter Outcome:  No Answer   Care Coordination Interventions:  No, not indicated    Delsa Sale RN BSN CCM [place]  [facility], [facility] Nurse Care Coordinator  Direct Dial: [phone] Website: [email or web address]

## 2023-07-21 ENCOUNTER — Other Ambulatory Visit: Payer: Self-pay | Admitting: Internal Medicine

## 2023-08-12 ENCOUNTER — Other Ambulatory Visit: Payer: Self-pay | Admitting: Internal Medicine

## 2023-08-13 ENCOUNTER — Telehealth: Payer: Self-pay | Admitting: *Deleted

## 2023-08-13 NOTE — Progress Notes (Signed)
 Complex Care Management Care Guide Note  08/13/2023 Name: [name] MRN: [id number] DOB: 1945/10/02  [name] is a 78 y.o. year old female who is a primary care patient of Dorothyann Peng, MD and is actively engaged with the care management team. I reached out to [name] by phone today to assist with re-scheduling  with the RN Case Manager.  Follow up plan: Unsuccessful telephone outreach attempt made.   [name]  [facility]  [facility], [facility] Guide  Direct Dial: [phone]  Fax [phone]

## 2023-08-20 NOTE — Progress Notes (Deleted)
 Cardiology Office Note:  .   Date:  08/20/2023  ID:  [name], DOB April 25, 1946, MRN [id number] PCP: Dorothyann Peng, MD  [facility] Providers Cardiologist:  Chilton Si, MD Electrophysiologist:  Will Jorja Loa, MD {  History of Present Illness: Marland Kitchen   [name] is a 78 y.o. female w/PMHx of  Chronic CHF (combined diastolic/systolic), NICM LBBB PAD (Dr. Allyson Sabal, medically managed), DM, HTN, HLD, smoker AFib, ATach (?), AFlutter (?)  (LVEF has waxed/waned over the years)  Saw dr. Duke Salvia 02/26/22 referred to EP at that time Saw Dr. Elberta Fortis Oct 2023, discussed management strategies, pt very undecided regarding ablation AAD, no changes were made  Subsequently plans for ablation,  Dec 29528  Ablation 05/29/23  Today's visit is scheduled as her 3 mo post ablation visit  ROS:   *** no 1 mo visit... *** symptoms, sites *** dig level... *** eliquis, dose, labs, bleeding *** update echo in SR? *** risk is 7  Arrhythmia/AAD hx AFib seems to go back to about 2011 AFib ablation 05/29/23 (PFA)  Studies Reviewed: Marland Kitchen    EKG done today and reviewed by myself:  ***  05/29/23: EPS/ablation CONCLUSIONS: 1. Sinus rhythm upon presentation.   2. Successful electrical isolation and anatomical encircling of all four pulmonary veins with pulse field ablation. 3. Ablation of posterior wall with pulse field ablation 4. No early apparent complications.   TTE 03/07/20   1. Since the prior study on 02/23/2019 LVEF has increased from 30-35% to  40-45%.   2. There is a false tending in the left ventricular apex. Left  ventricular ejection fraction, by estimation, is 40 to 45%. The left  ventricle has mildly decreased function. The left ventricle demonstrates  global hypokinesis. Left ventricular diastolic  parameters are consistent with Grade II diastolic dysfunction  (pseudonormalization). Elevated left atrial pressure. The average left  ventricular global  longitudinal strain is -13.7 %. The global longitudinal  strain is abnormal.   3. Right ventricular systolic function is normal. The right ventricular  size is normal.   4. Left atrial size was moderately dilated.   5. The mitral valve is normal in structure. Mild mitral valve  regurgitation. No evidence of mitral stenosis. Severe mitral annular  calcification.   6. The aortic valve is normal in structure. There is mild calcification  of the aortic valve. There is mild thickening of the aortic valve. Aortic  valve regurgitation is not visualized. Mild to moderate aortic valve  sclerosis/calcification is present,  without any evidence of aortic stenosis.   7. The inferior vena cava is normal in size with greater than 50%  respiratory variability, suggesting right atrial pressure of 3 mmHg.    Risk Assessment/Calculations:    Physical Exam:   VS:  There were no vitals taken for this visit.   Wt Readings from Last 3 Encounters:  05/29/23 174 lb (78.9 kg)  05/14/23 159 lb 6.4 oz (72.3 kg)  04/01/23 179 lb (81.2 kg)    GEN: Well nourished, well developed in no acute distress NECK: No JVD; No carotid bruits CARDIAC: ***RRR, no murmurs, rubs, gallops RESPIRATORY:  *** CTA b/l without rales, wheezing or rhonchi  ABDOMEN: Soft, non-tender, non-distended EXTREMITIES: *** No edema; No deformity   ASSESSMENT AND PLAN: .    persistent AFib CHA2DS2Vasc is 7, on Eliquis, *** appropriately dosed ***  Chronic CHF NICM *** C/w Dr. Rollen Sox  Secondary hypercoagulable state 2/2 AFib     {Are you ordering a CV  Procedure (e.g. stress test, cath, DCCV, TEE, etc)?   Press F2        :[id number]}     Dispo: ***  Signed, Sheilah Pigeon, PA-C

## 2023-08-21 ENCOUNTER — Ambulatory Visit: Payer: Medicare Other | Attending: Physician Assistant | Admitting: Physician Assistant

## 2023-08-21 ENCOUNTER — Other Ambulatory Visit: Payer: Self-pay | Admitting: Internal Medicine

## 2023-08-21 NOTE — Progress Notes (Signed)
 Complex Care Management Care Guide Note  08/21/2023 Name: [name] MRN: [id number] DOB: [birth year]  [name] is a 78 y.o. year old female who is a primary care patient of Dorothyann Peng, MD and is actively engaged with the care management team. I reached out to [name] by phone today to assist with re-scheduling  with the RN Case Manager.  Follow up plan: Unsuccessful telephone outreach attempt made. A HIPAA compliant phone message was left for the patient providing contact information and requesting a return call.No further outreach attempts will be made at this time. We have been unable to contact the patient to reschedule for complex care management services.   Gwenevere Ghazi  [facility]  [facility], [facility] Guide  Direct Dial: [phone]  Fax [phone]

## 2023-08-22 ENCOUNTER — Encounter: Payer: Self-pay | Admitting: Physician Assistant

## 2023-09-03 ENCOUNTER — Ambulatory Visit (INDEPENDENT_AMBULATORY_CARE_PROVIDER_SITE_OTHER): Payer: Medicare Other | Admitting: Internal Medicine

## 2023-09-03 ENCOUNTER — Encounter: Payer: Self-pay | Admitting: Internal Medicine

## 2023-09-03 VITALS — BP 110/80 | HR 94 | Temp 98.4°F | Ht 67.0 in | Wt 172.2 lb

## 2023-09-03 DIAGNOSIS — Z6826 Body mass index (BMI) 26.0-26.9, adult: Secondary | ICD-10-CM

## 2023-09-03 DIAGNOSIS — I480 Paroxysmal atrial fibrillation: Secondary | ICD-10-CM | POA: Diagnosis not present

## 2023-09-03 DIAGNOSIS — J438 Other emphysema: Secondary | ICD-10-CM | POA: Diagnosis not present

## 2023-09-03 DIAGNOSIS — D6869 Other thrombophilia: Secondary | ICD-10-CM

## 2023-09-03 DIAGNOSIS — E1122 Type 2 diabetes mellitus with diabetic chronic kidney disease: Secondary | ICD-10-CM

## 2023-09-03 DIAGNOSIS — I5042 Chronic combined systolic (congestive) and diastolic (congestive) heart failure: Secondary | ICD-10-CM | POA: Diagnosis not present

## 2023-09-03 DIAGNOSIS — N2581 Secondary hyperparathyroidism of renal origin: Secondary | ICD-10-CM

## 2023-09-03 DIAGNOSIS — E663 Overweight: Secondary | ICD-10-CM

## 2023-09-03 DIAGNOSIS — I130 Hypertensive heart and chronic kidney disease with heart failure and stage 1 through stage 4 chronic kidney disease, or unspecified chronic kidney disease: Secondary | ICD-10-CM

## 2023-09-03 DIAGNOSIS — W07XXXA Fall from chair, initial encounter: Secondary | ICD-10-CM | POA: Diagnosis not present

## 2023-09-03 DIAGNOSIS — I739 Peripheral vascular disease, unspecified: Secondary | ICD-10-CM

## 2023-09-03 DIAGNOSIS — Z794 Long term (current) use of insulin: Secondary | ICD-10-CM | POA: Diagnosis not present

## 2023-09-03 DIAGNOSIS — N1832 Chronic kidney disease, stage 3b: Secondary | ICD-10-CM

## 2023-09-03 DIAGNOSIS — E1151 Type 2 diabetes mellitus with diabetic peripheral angiopathy without gangrene: Secondary | ICD-10-CM | POA: Diagnosis not present

## 2023-09-03 DIAGNOSIS — E7800 Pure hypercholesterolemia, unspecified: Secondary | ICD-10-CM

## 2023-09-03 DIAGNOSIS — E2839 Other primary ovarian failure: Secondary | ICD-10-CM

## 2023-09-03 MED ORDER — APIXABAN 5 MG PO TABS: 5.0000 mg | ORAL_TABLET | Freq: Two times a day (BID) | ORAL | 2 refills | Status: DC

## 2023-09-03 MED ORDER — EZETIMIBE 10 MG PO TABS: 10.0000 mg | ORAL_TABLET | Freq: Every day | ORAL | 2 refills | Status: DC

## 2023-09-03 NOTE — Patient Instructions (Signed)

## 2023-09-03 NOTE — Progress Notes (Addendum)
 I,Victoria T Basil Lim, CMA,acting as a Neurosurgeon for Smiley Dung, MD.,have documented all relevant documentation on the behalf of Smiley Dung, MD,as directed by  Smiley Dung, MD while in the presence of Smiley Dung, MD.  Subjective:  Patient ID: [name] , female    DOB: 22-Jan-1946 , 78 y.o.   MRN: [id number]  Chief Complaint  Patient presents with   Diabetes    The patient is here for a follow-up on her diabetes and blood pressure, cholesterol & thyroid  follow up. She reports compliance with meds. She denies headaches, chest pain and shortness of breath. She has not completed colonoscopy or diabetic eye exam.    Hypertension   Hyperlipidemia   Hypothyroidism   Fall    She reports having a fall this past Saturday. Injuring her right knee & foot. Resulting in a limp she currently experiences.    HPI Discussed the use of AI scribe software for clinical note transcription with the patient, who gave verbal consent to proceed.  History of Present Illness [name] is a 78 year old female who presents for DM/BP follow-up.  She is also concerned about leg pain and medication management.  She experienced a fall on March 29th, where her right foot felt heavy and numb, causing her to fall backward into a chair. No frequent numbness in her legs, but her right foot felt heavy and numb at the time of the fall. She attributes the fall to her foot 'going to sleep' after sitting for a couple of hours.  Her symptoms have not returned.   She has experienced significant weight loss, reporting a weight of 161 pounds two weeks ago, down from 174 pounds in December. She attributes this to a lack of appetite and notes surprise at her current weight. She has not been eating regularly, often skipping meals, and reports a decreased appetite. Concerned about her nutritional intake, she mentions eating tuna and salmon but avoids eggs due to cost.  Family history reveals that her youngest son  recently had a triple bypass surgery, and her oldest son was hospitalized with heart issues. She expresses stress over her sons' health issues and notes that her relationship with her oldest son is strained due to her inability to be present during his hospitalization.   Diabetes She presents for her follow-up diabetic visit. She has type 2 diabetes mellitus. Her disease course has been stable. There are no hypoglycemic associated symptoms. Pertinent negatives for diabetes include no blurred vision. There are no hypoglycemic complications. Diabetic complications include nephropathy. Risk factors for coronary artery disease include diabetes mellitus, dyslipidemia, hypertension, obesity, post-menopausal and sedentary lifestyle. Meal planning includes avoidance of concentrated sweets. She participates in exercise intermittently. An ACE inhibitor/angiotensin II receptor blocker is being taken. Eye exam is current.  Hypertension This is a chronic problem. The current episode started more than 1 year ago. The problem has been gradually improving since onset. The problem is controlled. Pertinent negatives include no blurred vision. Risk factors for coronary artery disease include diabetes mellitus, dyslipidemia, obesity, post-menopausal state and sedentary lifestyle. The current treatment provides moderate improvement. Compliance problems include exercise.      Past Medical History:  Diagnosis Date   Arthritis    "left knee" (03/12/2013)   Atrial fibrillation (HCC)    Cardioverted to NSR 08/22/2009   Atrial tachycardia (HCC) 09/30/2019   Cardiomyopathy    CHF (congestive heart failure) (HCC)    Chronic combined systolic and diastolic heart  failure (HCC) 04/09/2021   CKD (chronic kidney disease)    Depression    Dizziness    DM2 (diabetes mellitus, type 2) (HCC)    fasting 80-100 (03/12/2013)   Drug therapy 1/11   coumadin    Ejection fraction < 50%    20-25%, echo.06/2009, /  EF 30-35%, diffuse  hypokinesis, echo, Oct 16, 2010   GERD (gastroesophageal reflux disease)    Gout    "left knee" (03/12/2013)   History of bronchitis    History of emphysema (HCC)    HLD (hyperlipidemia)    Hyperkalemia    Hypertension    LFT elevation    06/2009   Mitral regurgitation    mild - echo - 1/11 /  Mild, echo, May15, 2012   Overweight(278.02)    Pneumonia    "@ least twice" (03/12/2013)   Thyroid  dysfunction    thyromegally,diffuse,nodule LLL,06/2009   Tobacco abuse    Tricuspid regurgitation    moderate - echo - 1/11     Family History  Problem Relation Age of Onset   Stroke Mother    Prostate cancer Father    Hypertension Sister    Stroke Maternal Grandfather    Stroke Paternal Grandmother    Heart attack Son    Hypertension Son        X2   Colon cancer Other    Heart disease Other    Breast cancer Neg Hx      Current Outpatient Medications:    acetaminophen  (TYLENOL ) 500 MG tablet, Take 1,000 mg by mouth 3 (three) times daily as needed for pain., Disp: , Rfl:    BD PEN NEEDLE NANO U/F 32G X 4 MM MISC, USE AS DIRECTED WITH INSULIN  PENS, Disp: 100 each, Rfl: 11   betamethasone  valerate ointment (VALISONE ) 0.1 %, Apply 1 Application topically 2 (two) times daily. prn (Patient taking differently: Apply 1 Application topically daily as needed (Dry skin).), Disp: 30 g, Rfl: 0   Cholecalciferol (VITAMIN D) 2000 UNITS CAPS, Take 2,000 Units by mouth daily., Disp: , Rfl:    diclofenac  Sodium (VOLTAREN ) 1 % GEL, Apply 4 g topically 4 (four) times daily. (Patient taking differently: Apply 4 g topically 4 (four) times daily as needed (pain).), Disp: 100 g, Rfl: 0   glucose blood (ONETOUCH VERIO) test strip, USE TO TEST BLOOD SUGAR ONCE DAILY AS DIRECTED, Disp: 50 strip, Rfl: 10   KLOR-CON  M20 20 MEQ tablet, TAKE 1 TABLET (20 MEQ TOTAL) BY MOUTH DAILY. WHEN TAKING FUROSEMIDE  (LASIX ). (Patient not taking: Reported on 09/10/2023), Disp: 90 tablet, Rfl: 2   LIVALO  2 MG TABS, TAKE 1 TABLET BY  MOUTH EVERY DAY (Patient taking differently: Take 1 tablet by mouth every Monday, Wednesday, and Friday.), Disp: 90 tablet, Rfl: 2   Multiple Vitamin (MULITIVITAMIN WITH MINERALS) TABS, Take 1 tablet by mouth daily. Centrum Silver, Disp: , Rfl:    nystatin  cream (MYCOSTATIN ), APPLY TO AFFECTED AREA(S) TWICE DAILY AS NEEDED FOR DRY SKIN UNDER BREASTS, Disp: 30 g, Rfl: 10   pantoprazole  (PROTONIX ) 20 MG tablet, TAKE 1 TABLET BY MOUTH DAILY AS NEEDED FOR HEARTBURN, Disp: 30 tablet, Rfl: 10   tiZANidine  (ZANAFLEX ) 4 MG tablet, TAKE 1 TABLET (4 MG TOTAL) BY MOUTH DAILY AS NEEDED FOR BACK PAIN, Disp: 30 tablet, Rfl: 1   vitamin C (ASCORBIC ACID) 500 MG tablet, Take 500 mg by mouth daily., Disp: , Rfl:    apixaban  (ELIQUIS ) 5 MG TABS tablet, Take 1 tablet (5 mg total) by mouth  2 (two) times daily., Disp: 180 tablet, Rfl: 2   digoxin  (LANOXIN ) 0.125 MG tablet, Take 1 tablet (125 mcg total) by mouth daily. Please keep your upcoming appointment for refills., Disp: 90 tablet, Rfl: 2   enalapril  (VASOTEC ) 5 MG tablet, Take 1 tablet (5 mg total) by mouth at bedtime. Please keep your upcoming appointment for refills., Disp: 90 tablet, Rfl: 3   ezetimibe  (ZETIA ) 10 MG tablet, Take 1 tablet (10 mg total) by mouth daily., Disp: 90 tablet, Rfl: 2   furosemide  (LASIX ) 40 MG tablet, TAKE 1 TABLET BY MOUTH AS NEEDED FOR EDEMA OR FLUID, Disp: 30 tablet, Rfl: 10   metoprolol  succinate (TOPROL -XL) 50 MG 24 hr tablet, Take 1 tablet (50 mg total) by mouth daily. with food, Disp: 90 tablet, Rfl: 3   Semaglutide ,0.25 or 0.5MG /DOS, (OZEMPIC , 0.25 OR 0.5 MG/DOSE,) 2 MG/3ML SOPN, Inject 0.25 mg into the skin every Tuesday., Disp: 9 mL, Rfl: 2   Allergies  Allergen Reactions   Codeine Hives and Nausea And Vomiting   Augmentin  [Amoxicillin -Pot Clavulanate] Diarrhea   Coreg  [Carvedilol ] Other (See Comments)    No energy   Warfarin And Related Other (See Comments)    Severe rectal bleeding     Review of Systems   Constitutional: Negative.   Eyes:  Negative for blurred vision.  Respiratory: Negative.    Cardiovascular: Negative.   Gastrointestinal: Negative.   Neurological: Negative.   Psychiatric/Behavioral: Negative.       Today's Vitals   09/03/23 1532  BP: 110/80  Pulse: 94  Temp: 98.4 F (36.9 C)  SpO2: 98%  Weight: 172 lb 3.2 oz (78.1 kg)  Height: 5\' 7"  (1.702 m)   Body mass index is 26.97 kg/m.  Wt Readings from Last 3 Encounters:  09/03/23 172 lb 3.2 oz (78.1 kg)  05/29/23 174 lb (78.9 kg)  05/14/23 159 lb 6.4 oz (72.3 kg)     Objective:  Physical Exam Vitals and nursing note reviewed.  Constitutional:      Appearance: Normal appearance.  HENT:     Head: Normocephalic and atraumatic.  Eyes:     Extraocular Movements: Extraocular movements intact.  Cardiovascular:     Rate and Rhythm: Normal rate and regular rhythm.     Heart sounds: Normal heart sounds.  Pulmonary:     Effort: Pulmonary effort is normal.     Breath sounds: Normal breath sounds.  Musculoskeletal:     Cervical back: Normal range of motion.  Skin:    General: Skin is warm.  Neurological:     General: No focal deficit present.     Mental Status: She is alert.  Psychiatric:        Mood and Affect: Mood normal.        Behavior: Behavior normal.         Assessment And Plan:  Type 2 diabetes mellitus with stage 3b chronic kidney disease, with long-term current use of insulin  (HCC) Assessment & Plan: Significant weight loss and decreased appetite on Ozempic  raise concerns about malnutrition. Shaking episodes suggest potential hypoglycemia. Discussed proper nutrition and risks of continuing Ozempic  if not eating adequately. - Check blood glucose levels during shaking episodes. - Consider changing Ozempic  to every other week-- pt is advised to change to qoweek dosing, will discuss again after reviewing her labs - Keep juice on hand for potential hypoglycemia. - Evaluate A1c levels. - Discuss  potential medication change if nutritional intake does not improve.  Orders: -  CBC -     CMP14+EGFR -     Lipid panel -     Hemoglobin A1c -     AMB Referral VBCI Care Management -     PTH, intact and calcium  -     Phosphorus -     Protein electrophoresis, serum  Hypertensive heart and renal disease with renal failure, stage 1 through stage 4 or unspecified chronic kidney disease, with heart failure (HCC) Assessment & Plan: Chronic, controlled.  She is encouraged to stay well hydrated. For now, she will continue with enalapril  5mg  daily, metoprolol  XL 50mg  daily and furosemide  prn.   Orders: -     CMP14+EGFR -     Lipid panel -     AMB Referral VBCI Care Management  Paroxysmal atrial fibrillation (HCC) Assessment & Plan: Chronic, rate controlled with metoprolol  XL 50mg  daily and anticoagulated with Eliquis .    Diabetic peripheral vascular disorder [facility]) Assessment & Plan: Chronic, most recent notes from Vascular reviewed. Importance of smoking cessation and increased daily activity was stressed to the patient. LDL goal is less than 70.  Unfortunately, she did not have the additional testing as per Vascular.  - Emphasized calf exercises to improve blood flow. - Continue home exercises including heel raises. - Perform heel raises before standing after prolonged sitting. - Seek medical attention for severe pain recurrence.  Orders: -     AMB Referral VBCI Care Management  Chronic combined systolic and diastolic heart failure (HCC) Assessment & Plan: Chronic, currently on metoprolol  XL, enalapril , digoxin  and furosemide . Importance of medication and dietary compliance was stressed to the patient.    Secondary hyperparathyroidism of renal origin [facility]) Assessment & Plan: Chronic, she is now followed by Renal. I will check PTH today.   Orders: -     AMB Referral VBCI Care Management  Secondary hypercoagulable state [facility]) Assessment & Plan: She is currently on Eliquis   due to underlying PAF.    Other emphysema [facility]) Assessment & Plan: Emphysematous changes seen on Jan 2023 LDCT.  She has since been evaluated by Pulmonary. Importance of smoking cessation was stressed to the patient.    Fall from chair, initial encounter Assessment & Plan: She fell on 3/29.  She was attempting to stand from seated position. She states she had R foot numbness and fell backwards onto her buttocks. She did not lose consciousness.   A fall occurred due to numbness and heaviness in the right foot. Advised exercises to improve circulation and prevent future falls. - Perform heel raises before standing after prolonged sitting. - Monitor for numbness or heaviness in the legs.   Overweight with body mass index (BMI) of 26 to 26.9 in adult Assessment & Plan: Lack of appetite and significant weight loss raise concerns about malnutrition. Emphasized the importance of proper nutrition to prevent infections and maintain muscle strength. - Increase protein intake with foods like tuna and salmon. - Ensure regular meals for nourishment rather than hunger. - Monitor weight and nutritional intake.  Orders: -     Prealbumin   Return in 3 months (on 12/03/2023), or dm check.  Patient was given opportunity to ask questions. Patient verbalized understanding of the plan and was able to repeat key elements of the plan. All questions were answered to their satisfaction.    I, Smiley Dung, MD, have reviewed all documentation for this visit. The documentation on 09/03/23 for the exam, diagnosis, procedures, and orders are all accurate and complete.   IF YOU  HAVE BEEN REFERRED TO A SPECIALIST, IT MAY TAKE 1-2 WEEKS TO SCHEDULE/PROCESS THE REFERRAL. IF YOU HAVE NOT HEARD FROM US /SPECIALIST IN TWO WEEKS, PLEASE GIVE US  A CALL AT [phone] X [phone].   THE PATIENT IS ENCOURAGED TO PRACTICE SOCIAL DISTANCING DUE TO THE COVID-19 PANDEMIC.

## 2023-09-05 LAB — PROTEIN ELECTROPHORESIS, SERUM
A/G Ratio: 0.9 (ref 0.7–1.7)
Albumin ELP: 3.3 g/dL (ref 2.9–4.4)
Alpha 1: 0.3 g/dL (ref 0.0–0.4)
Alpha 2: 1.2 g/dL — ABNORMAL HIGH (ref 0.4–1.0)
Beta: 0.9 g/dL (ref 0.7–1.3)
Gamma Globulin: 1.1 g/dL (ref 0.4–1.8)
Globulin, Total: 3.5 g/dL (ref 2.2–3.9)

## 2023-09-05 LAB — CBC
Hematocrit: 38.4 % (ref 34.0–46.6)
Hemoglobin: 12.1 g/dL (ref 11.1–15.9)
MCH: 26 pg — ABNORMAL LOW (ref 26.6–33.0)
MCHC: 31.5 g/dL (ref 31.5–35.7)
MCV: 83 fL (ref 79–97)
Platelets: 250 10*3/uL (ref 150–450)
RBC: 4.65 x10E6/uL (ref 3.77–5.28)
RDW: 14.1 % (ref 11.7–15.4)
WBC: 8.5 10*3/uL (ref 3.4–10.8)

## 2023-09-05 LAB — CMP14+EGFR
ALT: 10 IU/L (ref 0–32)
AST: 17 IU/L (ref 0–40)
Albumin: 4.2 g/dL (ref 3.8–4.8)
Alkaline Phosphatase: 85 IU/L (ref 44–121)
BUN/Creatinine Ratio: 19 (ref 12–28)
BUN: 31 mg/dL — ABNORMAL HIGH (ref 8–27)
Bilirubin Total: 0.3 mg/dL (ref 0.0–1.2)
CO2: 23 mmol/L (ref 20–29)
Calcium: 9.9 mg/dL (ref 8.7–10.3)
Chloride: 99 mmol/L (ref 96–106)
Creatinine, Ser: 1.65 mg/dL — ABNORMAL HIGH (ref 0.57–1.00)
Globulin, Total: 2.6 g/dL (ref 1.5–4.5)
Glucose: 78 mg/dL (ref 70–99)
Potassium: 5.5 mmol/L — ABNORMAL HIGH (ref 3.5–5.2)
Sodium: 139 mmol/L (ref 134–144)
Total Protein: 6.8 g/dL (ref 6.0–8.5)
eGFR: 32 mL/min/{1.73_m2} — ABNORMAL LOW (ref >59)

## 2023-09-05 LAB — PREALBUMIN: PREALBUMIN: 22 mg/dL (ref 9–32)

## 2023-09-05 LAB — PTH, INTACT AND CALCIUM: PTH: 103 pg/mL — ABNORMAL HIGH (ref 15–65)

## 2023-09-05 LAB — LIPID PANEL
Chol/HDL Ratio: 3 ratio (ref 0.0–4.4)
Cholesterol, Total: 105 mg/dL (ref 100–199)
HDL: 35 mg/dL — ABNORMAL LOW (ref >39)
LDL Chol Calc (NIH): 43 mg/dL (ref 0–99)
Triglycerides: 162 mg/dL — ABNORMAL HIGH (ref 0–149)
VLDL Cholesterol Cal: 27 mg/dL (ref 5–40)

## 2023-09-05 LAB — HEMOGLOBIN A1C
Est. average glucose Bld gHb Est-mCnc: 128 mg/dL
Hgb A1c MFr Bld: 6.1 % — ABNORMAL HIGH (ref 4.8–5.6)

## 2023-09-05 LAB — PHOSPHORUS: Phosphorus: 3.3 mg/dL (ref 3.0–4.3)

## 2023-09-06 ENCOUNTER — Encounter: Payer: Self-pay | Admitting: Internal Medicine

## 2023-09-10 ENCOUNTER — Other Ambulatory Visit: Payer: Self-pay | Admitting: Cardiovascular Disease

## 2023-09-10 ENCOUNTER — Telehealth: Payer: Self-pay | Admitting: Pharmacist

## 2023-09-10 ENCOUNTER — Other Ambulatory Visit (HOSPITAL_COMMUNITY): Payer: Self-pay

## 2023-09-10 DIAGNOSIS — E7800 Pure hypercholesterolemia, unspecified: Secondary | ICD-10-CM

## 2023-09-10 DIAGNOSIS — I4719 Other supraventricular tachycardia: Secondary | ICD-10-CM

## 2023-09-10 DIAGNOSIS — I480 Paroxysmal atrial fibrillation: Secondary | ICD-10-CM

## 2023-09-10 DIAGNOSIS — I10 Essential (primary) hypertension: Secondary | ICD-10-CM

## 2023-09-10 MED ORDER — METOPROLOL SUCCINATE ER 50 MG PO TB24
50.0000 mg | ORAL_TABLET | Freq: Every day | ORAL | 3 refills | Status: DC
  Filled 2023-09-10: qty 90, 90d supply, fill #0

## 2023-09-10 MED ORDER — OZEMPIC (0.25 OR 0.5 MG/DOSE) 2 MG/3ML ~~LOC~~ SOPN
0.2500 mg | PEN_INJECTOR | SUBCUTANEOUS | 2 refills | Status: DC
Start: 2023-09-16 — End: 2023-10-21
  Filled 2023-09-10: qty 9, 84d supply, fill #0

## 2023-09-10 MED ORDER — EZETIMIBE 10 MG PO TABS
10.0000 mg | ORAL_TABLET | Freq: Every day | ORAL | 2 refills | Status: AC
  Filled 2023-09-10: qty 90, 90d supply, fill #0

## 2023-09-10 MED ORDER — DIGOXIN 125 MCG PO TABS
125.0000 ug | ORAL_TABLET | Freq: Every day | ORAL | 2 refills | Status: DC
  Filled 2023-09-10: qty 90, 90d supply, fill #0

## 2023-09-10 MED ORDER — APIXABAN 5 MG PO TABS
5.0000 mg | ORAL_TABLET | Freq: Two times a day (BID) | ORAL | 2 refills | Status: AC
  Filled 2023-09-10: qty 180, 90d supply, fill #0

## 2023-09-10 MED ORDER — FUROSEMIDE 40 MG PO TABS
ORAL_TABLET | ORAL | 10 refills | Status: DC
  Filled 2023-09-10: qty 30, 30d supply, fill #0

## 2023-09-10 MED ORDER — ENALAPRIL MALEATE 5 MG PO TABS
5.0000 mg | ORAL_TABLET | Freq: Every day | ORAL | 3 refills | Status: DC
  Filled 2023-09-10: qty 90, 90d supply, fill #0

## 2023-09-10 NOTE — Progress Notes (Signed)
 09/10/2023 Name: [name] MRN: [id number] DOB: Nov 18, 1945  Chief Complaint  Patient presents with   Medication Management    [name] is a 78 y.o. year old female who presented for a telephone visit.   They were referred to the pharmacist by their PCP for assistance in managing medication access. (Help with getting meds locally through mail order)   Subjective:  Care Team: Primary Care Provider: Dorothyann Peng, MD ; Next Scheduled Visit:     Medication Access/Adherence  Current Pharmacy:  [facility] - [place], [place] - [address] [place] [address] Phone: [phone] Fax: [phone]   Patient reports affordability concerns with their medications: No --she is dually eligible Patient reports access/transportation concerns to their pharmacy: No  Patient reports adherence concerns with their medications:  No      Diabetes:  Current medications:  Ozempic 0.25 mg weekly   Hypertension:  Current medications:   Metoprolol XL 50 mg 1 tablet daily Enalapril 5 mg 1 tablet daily  Hyperlipidemia/ASCVD Risk Reduction  Current lipid lowering medications:  Livalo 2 mg 1 tablet daily Ezetimibe 10 mg 1 tablet daily  Antiplatelet regimen:   Objective:  Lab Results  Component Value Date   HGBA1C 6.1 (H) 09/03/2023    Lab Results  Component Value Date   CREATININE 1.65 (H) 09/03/2023   BUN 31 (H) 09/03/2023   NA 139 09/03/2023   K 5.5 (H) 09/03/2023   CL 99 09/03/2023   CO2 23 09/03/2023    Lab Results  Component Value Date   CHOL 105 09/03/2023   HDL 35 (L) 09/03/2023   LDLCALC 43 09/03/2023   TRIG 162 (H) 09/03/2023   CHOLHDL 3.0 09/03/2023    Medications Reviewed Today     Reviewed by [name], RPH (Pharmacist) on 09/10/23 at 1429  Med List Status: <None>   Medication Order Taking? Sig Documenting Provider Last Dose Status Informant  acetaminophen (TYLENOL) 500 MG  tablet [id number] Yes Take 1,000 mg by mouth 3 (three) times daily as needed for pain. [provider] Taking Active Self  apixaban (ELIQUIS) 5 MG TABS tablet [id number] Yes Take 1 tablet (5 mg total) by mouth 2 (two) times daily. Shelby Peng, MD Taking Active   BD PEN NEEDLE NANO U/F 32G X 4 MM MISC [id number] Yes USE AS DIRECTED WITH INSULIN PENS Shelby Peng, MD Taking Active Self  betamethasone valerate ointment (VALISONE) 0.1 % [id number] Yes Apply 1 Application topically 2 (two) times daily. prn  Patient taking differently: Apply 1 Application topically daily as needed (Dry skin).   Shelby Peng, MD Taking Active Self  Cholecalciferol (VITAMIN D) 2000 UNITS CAPS [id number] Yes Take 2,000 Units by mouth daily. [provider] Taking Active Self  diclofenac Sodium (VOLTAREN) 1 % GEL [id number] Yes Apply 4 g topically 4 (four) times daily.  Patient taking differently: Apply 4 g topically 4 (four) times daily as needed (pain).   Shelby Plan, DO Taking Active Self  digoxin (LANOXIN) 0.125 MG tablet [id number] Yes Take 1 tablet (125 mcg total) by mouth daily. Please keep your upcoming appointment for refills. Shelby Si, MD Taking Active   enalapril (VASOTEC) 5 MG tablet [id number] Yes Take 1 tablet (5 mg total) by mouth at bedtime. Please keep your upcoming appointment for refills. Shelby Peng, MD Taking Active Self  ezetimibe (ZETIA) 10 MG tablet [id number] Yes Take 1 tablet (10 mg total) by mouth daily. [name],  Melina Schools, MD Taking Active   furosemide (LASIX) 40 MG tablet [id number] Yes TAKE 1 TABLET BY MOUTH AS NEEDED FOR EDEMA OR FLUID Shelby Peng, MD Taking Active Self  glucose blood [facility] VERIO) test strip [id number] Yes USE TO TEST BLOOD SUGAR ONCE DAILY AS DIRECTED Shelby Peng, MD Taking Active Self  KLOR-CON M20 20 MEQ tablet [id number] No TAKE 1 TABLET (20 MEQ TOTAL) BY MOUTH DAILY. WHEN TAKING FUROSEMIDE (LASIX).  Patient not taking: Reported on 09/10/2023    Corky Crafts, MD Not Taking Active Self  LIVALO 2 MG TABS [id number] Yes TAKE 1 TABLET BY MOUTH EVERY DAY  Patient taking differently: Take 1 tablet by mouth every Monday, Wednesday, and Friday.   Shelby Peng, MD Taking Active Self  metoprolol succinate (TOPROL-XL) 50 MG 24 hr tablet [id number] Yes TAKE 1 TABLET BY MOUTH ONCE DAILY WITH FOOD Shelby Peng, MD Taking Active   Multiple Vitamin [facility] WITH MINERALS) TABS [id number] Yes Take 1 tablet by mouth daily. Centrum Silver [provider] Taking Active Self  nystatin cream (MYCOSTATIN) [id number] Yes APPLY TO AFFECTED AREA(S) TWICE DAILY AS NEEDED FOR DRY SKIN UNDER Shelby Mink, MD Taking Active Self  pantoprazole (PROTONIX) 20 MG tablet [id number] Yes TAKE 1 TABLET BY MOUTH DAILY AS NEEDED FOR Shelby Rankins, MD Taking Active   Semaglutide,0.25 or 0.5MG /DOS, (OZEMPIC, 0.25 OR 0.5 MG/DOSE,) 2 MG/3ML SOPN [id number] Yes Inject 0.25 mg into the skin every Tuesday. Shelby Peng, MD Taking Active   tiZANidine (ZANAFLEX) 4 MG tablet [id number] Yes TAKE 1 TABLET (4 MG TOTAL) BY MOUTH DAILY AS NEEDED FOR BACK PAIN Shelby Peng, MD Taking Active Self  vitamin C (ASCORBIC ACID) 500 MG tablet [id number] Yes Take 500 mg by mouth daily. [provider] Taking Active Self              Assessment/Little:   Diabetes: - Currently controlled 6.1% - Recommend to continue current therapy  - Patient denies personal or family history of multiple endocrine neoplasia type 2, medullary thyroid cancer; personal history of pancreatitis or gallbladder disease.   Hypertension: B/P 110/80 - Currently controlled - Recommend to Continue current therapy    Hyperlipidemia/ASCVD Risk Reduction: - Currently controlled.  - Recommend to Continue current therapy   Called [employer] and got the patient set up to receive her medications through the mail.  She verified her address and provided a payment  method. We also got her set up to receive a text message prior to her medication being delivered to her.  Per RX fill history, several of her medications were filled today at [facility] in [place].  New prescriptions will be sent to [facility] on the Patient's behalf. She communicated understanding that if a medication is too early to be filled, she will have to wait until next month to get it.  Follow Up Little:    Sent the following medications to [facility];   Eliquis, digoxin, Enalapril, Ezetimibe, Furosemide, metoprolol, and Ozempic.  All were sent for a 90 day supply with the exception of furosemide since it is PRN  Will follow up with the Pharmacy tomorrow to see what could be filled and to verify copays.   Will call the patient after verification with the Pharmacy.  [name], PharmD, BCACP Clinical Pharmacist [phone]

## 2023-09-11 ENCOUNTER — Other Ambulatory Visit (HOSPITAL_COMMUNITY): Payer: Self-pay

## 2023-09-11 ENCOUNTER — Telehealth: Payer: Self-pay | Admitting: Pharmacist

## 2023-09-11 DIAGNOSIS — Z794 Long term (current) use of insulin: Secondary | ICD-10-CM

## 2023-09-11 NOTE — Progress Notes (Signed)
   09/11/2023  Patient ID: [name], female   DOB: August 07, 1945, 78 y.o.   MRN: [id number]  Sent prescriptions over to [facility] yesterday to establish patient with the [facility].  Patient had refills done at [facility] yesterday as well that will be delivered to her. Called patient back to let her know and to remind her to ask [facility] to stop filling and sending her prescriptions. Patient communicated understanding.  Plan: Follow up prior to upcoming refills.  Beecher Mcardle, PharmD, BCACP Clinical Pharmacist [phone]

## 2023-09-14 DIAGNOSIS — W07XXXA Fall from chair, initial encounter: Secondary | ICD-10-CM | POA: Insufficient documentation

## 2023-09-14 NOTE — Assessment & Plan Note (Addendum)
 Chronic, she is now followed by Renal. I will check PTH today.

## 2023-09-14 NOTE — Assessment & Plan Note (Signed)
Chronic, rate controlled with metoprolol XL 50mg  daily and anticoagulated with Eliquis.

## 2023-09-14 NOTE — Assessment & Plan Note (Signed)
 Emphysematous changes seen on Jan 2023 LDCT.  She has since been evaluated by Pulmonary. Importance of smoking cessation was stressed to the patient.

## 2023-09-14 NOTE — Assessment & Plan Note (Signed)
 Chronic, most recent notes from Vascular reviewed. Importance of smoking cessation and increased daily activity was stressed to the patient. LDL goal is less than 70.  Unfortunately, she did not have the additional testing as per Vascular.  - Emphasized calf exercises to improve blood flow. - Continue home exercises including heel raises. - Perform heel raises before standing after prolonged sitting. - Seek medical attention for severe pain recurrence.

## 2023-09-14 NOTE — Assessment & Plan Note (Signed)
 Chronic, controlled.  She is encouraged to stay well hydrated. For now, she will continue with enalapril 5mg  daily, metoprolol XL 50mg  daily and furosemide prn.

## 2023-09-14 NOTE — Assessment & Plan Note (Signed)
She is currently on Eliquis due to underlying PAF.

## 2023-09-14 NOTE — Assessment & Plan Note (Signed)
 She fell on 3/29.  She was attempting to stand from seated position. She states she had R foot numbness and fell backwards onto her buttocks. She did not lose consciousness.   A fall occurred due to numbness and heaviness in the right foot. Advised exercises to improve circulation and prevent future falls. - Perform heel raises before standing after prolonged sitting. - Monitor for numbness or heaviness in the legs.

## 2023-09-14 NOTE — Assessment & Plan Note (Signed)
 Significant weight loss and decreased appetite on Ozempic raise concerns about malnutrition. Shaking episodes suggest potential hypoglycemia. Discussed proper nutrition and risks of continuing Ozempic if not eating adequately. - Check blood glucose levels during shaking episodes. - Consider changing Ozempic to every other week-- pt is advised to change to qoweek dosing, will discuss again after reviewing her labs - Keep juice on hand for potential hypoglycemia. - Evaluate A1c levels. - Discuss potential medication change if nutritional intake does not improve.

## 2023-09-14 NOTE — Assessment & Plan Note (Signed)
 Lack of appetite and significant weight loss raise concerns about malnutrition. Emphasized the importance of proper nutrition to prevent infections and maintain muscle strength. - Increase protein intake with foods like tuna and salmon. - Ensure regular meals for nourishment rather than hunger. - Monitor weight and nutritional intake.

## 2023-09-23 NOTE — Assessment & Plan Note (Signed)
Chronic, currently on metoprolol XL, enalapril, digoxin and furosemide. Importance of medication and dietary compliance was stressed to the patient.

## 2023-10-02 DIAGNOSIS — N2581 Secondary hyperparathyroidism of renal origin: Secondary | ICD-10-CM | POA: Diagnosis not present

## 2023-10-02 DIAGNOSIS — E1122 Type 2 diabetes mellitus with diabetic chronic kidney disease: Secondary | ICD-10-CM | POA: Diagnosis not present

## 2023-10-02 DIAGNOSIS — I129 Hypertensive chronic kidney disease with stage 1 through stage 4 chronic kidney disease, or unspecified chronic kidney disease: Secondary | ICD-10-CM | POA: Diagnosis not present

## 2023-10-02 DIAGNOSIS — N1832 Chronic kidney disease, stage 3b: Secondary | ICD-10-CM | POA: Diagnosis not present

## 2023-10-02 DIAGNOSIS — I5020 Unspecified systolic (congestive) heart failure: Secondary | ICD-10-CM | POA: Diagnosis not present

## 2023-10-03 LAB — LAB REPORT - SCANNED
Albumin, Urine POC: 29.2
Albumin/Creatinine Ratio, Urine, POC: 17
Creatinine, POC: 168.2 mg/dL
EGFR: 28

## 2023-10-06 ENCOUNTER — Other Ambulatory Visit: Payer: Self-pay | Admitting: Internal Medicine

## 2023-10-06 ENCOUNTER — Telehealth: Payer: Self-pay

## 2023-10-06 DIAGNOSIS — E875 Hyperkalemia: Secondary | ICD-10-CM

## 2023-10-06 NOTE — Telephone Encounter (Signed)
 LVM to schedule patient for a lab visit this week, labs in future.

## 2023-10-09 ENCOUNTER — Other Ambulatory Visit

## 2023-10-09 DIAGNOSIS — E875 Hyperkalemia: Secondary | ICD-10-CM | POA: Diagnosis not present

## 2023-10-10 ENCOUNTER — Encounter: Payer: Self-pay | Admitting: Internal Medicine

## 2023-10-10 LAB — BMP8+EGFR
BUN/Creatinine Ratio: 26 (ref 12–28)
BUN: 41 mg/dL — ABNORMAL HIGH (ref 8–27)
CO2: 20 mmol/L (ref 20–29)
Calcium: 9.5 mg/dL (ref 8.7–10.3)
Chloride: 102 mmol/L (ref 96–106)
Creatinine, Ser: 1.6 mg/dL — ABNORMAL HIGH (ref 0.57–1.00)
Glucose: 86 mg/dL (ref 70–99)
Potassium: 3.9 mmol/L (ref 3.5–5.2)
Sodium: 139 mmol/L (ref 134–144)
eGFR: 33 mL/min/{1.73_m2} — ABNORMAL LOW (ref >59)

## 2023-10-21 ENCOUNTER — Telehealth: Payer: Self-pay | Admitting: Cardiovascular Disease

## 2023-10-21 ENCOUNTER — Other Ambulatory Visit: Payer: Self-pay | Admitting: Internal Medicine

## 2023-10-21 ENCOUNTER — Telehealth: Payer: Self-pay | Admitting: Internal Medicine

## 2023-10-21 DIAGNOSIS — I10 Essential (primary) hypertension: Secondary | ICD-10-CM

## 2023-10-21 NOTE — Telephone Encounter (Unsigned)
 Copied from CRM [id number]. Topic: Clinical - Medication Refill >> Oct 21, 2023 10:28 AM [name] wrote: Medication: digoxin  (LANOXIN ) 0.125 MG tablet  90 day supply [facility] Semaglutide ,0.25 or 0.5MG /DOS, (OZEMPIC , 0.25 OR 0.5 MG/DOSE) 90 Day supply  Has the patient contacted their pharmacy? No (Agent: If no, request that the patient contact the pharmacy for the refill. If patient does not wish to contact the pharmacy document the reason why and proceed with request.) (Agent: If yes, when and what did the pharmacy advise?)  This is the patient's preferred pharmacy:  [facility] [address] [place] [address]  Is this the correct pharmacy for this prescription? Yes If no, delete pharmacy and type the correct one.   Has the prescription been filled recently? No  Is the patient out of the medication? Yes  Has the patient been seen for an appointment in the last year OR does the patient have an upcoming appointment? Yes  Can we respond through MyChart? No  Agent: Please be advised that Rx refills may take up to 3 business days. We ask that you follow-up with your pharmacy.

## 2023-10-21 NOTE — Telephone Encounter (Signed)
 Pt will not make an appt with anyone but Dr Theodis Fiscal. She needs refills. I have tried to explain to pt that Dr Theodis Fiscal doesn't have anything but the NP and PA help the doctors see their pts especially when the doctors have no appts. She states that she WILL NOT see anyone else. She no showed her last ov with Mertha Abrahams because she will not see anyone but Dr Lawana Pray. Looks like she has been getting meds refilled but has not kept appt or made appt.Please advise.

## 2023-10-21 NOTE — Telephone Encounter (Signed)
 Patient called back to advise that she spoke with the pharmacy and they are shipping out her Semaglutide ,0.25 or 0.5MG /DOS, (OZEMPIC , 0.25 OR 0.5 MG/DOSE) 90 Day supply tomorrow and her other medications were received today.

## 2023-11-21 ENCOUNTER — Ambulatory Visit: Payer: Self-pay

## 2023-11-21 NOTE — Telephone Encounter (Signed)
 FYI Only or Action Required?: FYI only for provider.  Patient was last seen in primary care on 09/03/2023 by Cleave Curling, MD. [facility] Nurse Triage reporting low blood pressure. Symptoms began today. Interventions attempted: Rest, hydration, or home remedies. Symptoms are: stable.  Triage Disposition: See PCP When Office is Open (Within 3 Days)  Patient/caregiver understands and will follow disposition?: Yes   Copied from CRM [id number]. Topic: Clinical - Red Word Triage >> Nov 21, 2023 10:53 AM Oddis Bench wrote: Red Word that prompted transfer to Nurse Triage: patient has numbness in her hands light headed  when trying to walk. b/p 91/50 and sugar 89. Reason for Disposition  Brief (now gone) dizziness or lightheadedness after standing up or eating    Pt stated did not want to make appmt, just report message to PCP team.  Answer Assessment - Initial Assessment Questions Pt reports Felt weak upon getting up to walk, trembling, lightheaded upon getting up this morning. BP was a bit low at 91/50 with Blood sugar of 89, pt states MD wants pt around 110, pt states BS usually around 92.  Pt states did just get over a cold, as well.   Pt states not wanting to go to ED or anything, Nurse offers to help provide assistance in looking for appmt at a local UC to avoid waiting.  Pt states does not want to go in to ED/UC at this time, but pt wanted to call in to say what went on with her this morning and put it on record.  Nurse advised to stay hydrated, eat small meals if not having a big appetite post cold, make sure to get up and get around slowly, continue to monitor her BP's and BS. Pt verbalizes understanding.    1. BLOOD PRESSURE: What is the blood pressure? Did you take at least two measurements 5 minutes apart?     91/50 this morning 2. ONSET: When did you take your blood pressure?     This morning  3. HOW: How did you obtain the blood pressure? (e.g., visiting nurse, automatic home BP  monitor)     BP cuff 4. HISTORY: Do you have a history of low blood pressure? What is your blood pressure normally?      HTN, Pt states BP daily: normal BP around 122-123/90. 5. MEDICINES: Are you taking any medications for blood pressure? If Yes, ask: Have they been changed recently? Yes 6. PULSE RATE: Do you know what your pulse rate is?      70 something pt states  7. OTHER SYMPTOMS: Have you been sick recently? Have you had a recent injury?     Felt weak upon getting up to walk, trembling, lightheaded upon getting up this morning  Protocols used: Blood Pressure - Low-A-AH

## 2023-12-03 ENCOUNTER — Ambulatory Visit: Admitting: Internal Medicine

## 2023-12-04 ENCOUNTER — Other Ambulatory Visit: Payer: Self-pay

## 2023-12-04 ENCOUNTER — Ambulatory Visit: Admitting: Internal Medicine

## 2023-12-04 ENCOUNTER — Encounter: Payer: Self-pay | Admitting: Internal Medicine

## 2023-12-04 ENCOUNTER — Other Ambulatory Visit (HOSPITAL_COMMUNITY): Payer: Self-pay

## 2023-12-04 VITALS — BP 118/70 | HR 80 | Temp 97.9°F | Ht 67.0 in | Wt 172.2 lb

## 2023-12-04 DIAGNOSIS — D6869 Other thrombophilia: Secondary | ICD-10-CM | POA: Diagnosis not present

## 2023-12-04 DIAGNOSIS — E7800 Pure hypercholesterolemia, unspecified: Secondary | ICD-10-CM | POA: Diagnosis not present

## 2023-12-04 DIAGNOSIS — N2581 Secondary hyperparathyroidism of renal origin: Secondary | ICD-10-CM

## 2023-12-04 DIAGNOSIS — E1122 Type 2 diabetes mellitus with diabetic chronic kidney disease: Secondary | ICD-10-CM | POA: Diagnosis not present

## 2023-12-04 DIAGNOSIS — J438 Other emphysema: Secondary | ICD-10-CM

## 2023-12-04 DIAGNOSIS — Z79899 Other long term (current) drug therapy: Secondary | ICD-10-CM | POA: Diagnosis not present

## 2023-12-04 DIAGNOSIS — E1151 Type 2 diabetes mellitus with diabetic peripheral angiopathy without gangrene: Secondary | ICD-10-CM

## 2023-12-04 DIAGNOSIS — I130 Hypertensive heart and chronic kidney disease with heart failure and stage 1 through stage 4 chronic kidney disease, or unspecified chronic kidney disease: Secondary | ICD-10-CM

## 2023-12-04 DIAGNOSIS — N1832 Chronic kidney disease, stage 3b: Secondary | ICD-10-CM

## 2023-12-04 DIAGNOSIS — I480 Paroxysmal atrial fibrillation: Secondary | ICD-10-CM | POA: Diagnosis not present

## 2023-12-04 DIAGNOSIS — I10 Essential (primary) hypertension: Secondary | ICD-10-CM

## 2023-12-04 DIAGNOSIS — Z794 Long term (current) use of insulin: Secondary | ICD-10-CM

## 2023-12-04 DIAGNOSIS — I4719 Other supraventricular tachycardia: Secondary | ICD-10-CM

## 2023-12-04 DIAGNOSIS — I5042 Chronic combined systolic (congestive) and diastolic (congestive) heart failure: Secondary | ICD-10-CM | POA: Diagnosis not present

## 2023-12-04 DIAGNOSIS — I70222 Atherosclerosis of native arteries of extremities with rest pain, left leg: Secondary | ICD-10-CM | POA: Diagnosis not present

## 2023-12-04 MED ORDER — OZEMPIC (0.25 OR 0.5 MG/DOSE) 2 MG/3ML ~~LOC~~ SOPN: PEN_INJECTOR | SUBCUTANEOUS | 11 refills | Status: DC

## 2023-12-04 MED ORDER — OZEMPIC (0.25 OR 0.5 MG/DOSE) 2 MG/3ML ~~LOC~~ SOPN
PEN_INJECTOR | SUBCUTANEOUS | 11 refills | Status: AC
  Filled 2023-12-04: qty 3, 56d supply, fill #0
  Filled 2024-02-05: qty 3, 56d supply, fill #1

## 2023-12-04 MED ORDER — DIGOXIN 125 MCG PO TABS
125.0000 ug | ORAL_TABLET | Freq: Every day | ORAL | 2 refills | Status: DC
Start: 2023-12-04 — End: 2023-12-04

## 2023-12-04 MED ORDER — DIGOXIN 125 MCG PO TABS
125.0000 ug | ORAL_TABLET | Freq: Every day | ORAL | 2 refills | Status: AC
  Filled 2023-12-04: qty 90, 90d supply, fill #0
  Filled 2024-03-16: qty 90, 90d supply, fill #1

## 2023-12-04 NOTE — Progress Notes (Signed)
 I,[name], CMA,acting as a Neurosurgeon for Catheryn LOISE Slocumb, MD.,have documented all relevant documentation on the behalf of Catheryn LOISE Slocumb, MD,as directed by  Catheryn LOISE Slocumb, MD while in the presence of Catheryn LOISE Slocumb, MD.  Subjective:  Patient ID: [name] , female    DOB: [birth year] , 78 y.o.   MRN: [id number]  Chief Complaint  Patient presents with   Diabetes    Patient presents today for dm & bp follow up. She reports compliance with medications. Denies headache, chest pain & sob. She has not yet scheduled colonoscopy or DM eye exam.     Hypertension    HPI Discussed the use of AI scribe software for clinical note transcription with the patient, who gave verbal consent to proceed.  History of Present Illness [name] is a 78 year old female with diabetes who presents for a diabetes check.  Her blood sugar this morning was 89 mg/dL. She is concerned about the potential costs of test strips and other diabetes-related supplies and prefers not to use a patch for diabetes management.  She has a history of atrial flutter and underwent a catheter ablation. She is currently taking metoprolol  but has been out of digoxin  due to a missed appointment, which led to a denial of her refill. She takes digoxin  at a dose of 0.125 mg. She also has a stockpile of Livalo  but is running out and needs a refill.  She recalls a past issue with her legs that required a visit to a vascular doctor, Dr. Pearline, who recommended an angiogram. She was advised to go straight to the hospital if she experiences similar symptoms again. She has not followed up with Dr. Pearline since November, when a follow-up was due in February.  She mentions seeing a kidney specialist in May, who extended her follow-up to a year. She discusses her medication management issues, including a transition from [facility] to [facility], and the need for better coordination of her medication  refills.   Diabetes She presents for her follow-up diabetic visit. She has type 2 diabetes mellitus. Her disease course has been stable. There are no hypoglycemic associated symptoms. Pertinent negatives for diabetes include no blurred vision. There are no hypoglycemic complications. Diabetic complications include nephropathy. Risk factors for coronary artery disease include diabetes mellitus, dyslipidemia, hypertension, obesity, post-menopausal and sedentary lifestyle. Meal planning includes avoidance of concentrated sweets. She participates in exercise intermittently. An ACE inhibitor/angiotensin II receptor blocker is being taken. Eye exam is current.  Hypertension This is a chronic problem. The current episode started more than 1 year ago. The problem has been gradually improving since onset. The problem is controlled. Pertinent negatives include no blurred vision. Risk factors for coronary artery disease include diabetes mellitus, dyslipidemia, obesity, post-menopausal state and sedentary lifestyle. The current treatment provides moderate improvement. Compliance problems include exercise.      Past Medical History:  Diagnosis Date   Arthritis    left knee (03/12/2013)   Atrial fibrillation (HCC)    Cardioverted to NSR 08/22/2009   Atrial tachycardia (HCC) 09/30/2019   Cardiomyopathy    CHF (congestive heart failure) (HCC)    Chronic combined systolic and diastolic heart failure (HCC) 04/09/2021   CKD (chronic kidney disease)    Depression    Dizziness    DM2 (diabetes mellitus, type 2) (HCC)    fasting 80-100 (03/12/2013)   Drug therapy 1/11   coumadin    Ejection fraction < 50%  20-25%, echo.06/2009, /  EF 30-35%, diffuse hypokinesis, echo, Oct 16, 2010   GERD (gastroesophageal reflux disease)    Gout    left knee (03/12/2013)   History of bronchitis    History of emphysema (HCC)    HLD (hyperlipidemia)    Hyperkalemia    Hypertension    LFT elevation    06/2009   Mitral  regurgitation    mild - echo - 1/11 /  Mild, echo, May15, 2012   Overweight(278.02)    Pneumonia    @ least twice (03/12/2013)   Thyroid  dysfunction    thyromegally,diffuse,nodule LLL,06/2009   Tobacco abuse    Tricuspid regurgitation    moderate - echo - 1/11     Family History  Problem Relation Age of Onset   Stroke Mother    Prostate cancer Father    Hypertension Sister    Stroke Maternal Grandfather    Stroke Paternal Grandmother    Heart attack Son    Hypertension Son        X2   Colon cancer Other    Heart disease Other    Breast cancer Neg Hx      Current Outpatient Medications:    acetaminophen  (TYLENOL ) 500 MG tablet, Take 1,000 mg by mouth 3 (three) times daily as needed for pain., Disp: , Rfl:    apixaban  (ELIQUIS ) 5 MG TABS tablet, Take 1 tablet (5 mg total) by mouth 2 (two) times daily., Disp: 180 tablet, Rfl: 2   BD PEN NEEDLE NANO U/F 32G X 4 MM MISC, USE AS DIRECTED WITH INSULIN  PENS, Disp: 100 each, Rfl: 11   betamethasone  valerate ointment (VALISONE ) 0.1 %, Apply 1 Application topically 2 (two) times daily. prn (Patient taking differently: Apply 1 Application topically daily as needed (Dry skin).), Disp: 30 g, Rfl: 0   Cholecalciferol (VITAMIN D) 2000 UNITS CAPS, Take 2,000 Units by mouth daily., Disp: , Rfl:    diclofenac  Sodium (VOLTAREN ) 1 % GEL, Apply 4 g topically 4 (four) times daily. (Patient taking differently: Apply 4 g topically 4 (four) times daily as needed (pain).), Disp: 100 g, Rfl: 0   enalapril  (VASOTEC ) 5 MG tablet, Take 1 tablet (5 mg total) by mouth at bedtime. Please keep your upcoming appointment for refills., Disp: 90 tablet, Rfl: 3   ezetimibe  (ZETIA ) 10 MG tablet, Take 1 tablet (10 mg total) by mouth daily., Disp: 90 tablet, Rfl: 2   furosemide  (LASIX ) 40 MG tablet, TAKE 1 TABLET BY MOUTH AS NEEDED FOR EDEMA OR FLUID, Disp: 30 tablet, Rfl: 10   glucose blood (ONETOUCH VERIO) test strip, USE TO TEST BLOOD SUGAR ONCE DAILY AS DIRECTED,  Disp: 50 strip, Rfl: 10   KLOR-CON  M20 20 MEQ tablet, TAKE 1 TABLET (20 MEQ TOTAL) BY MOUTH DAILY. WHEN TAKING FUROSEMIDE  (LASIX )., Disp: 90 tablet, Rfl: 2   LIVALO  2 MG TABS, TAKE 1 TABLET BY MOUTH EVERY DAY (Patient taking differently: Take 1 tablet by mouth every Monday, Wednesday, and Friday.), Disp: 90 tablet, Rfl: 2   metoprolol  succinate (TOPROL -XL) 50 MG 24 hr tablet, Take 1 tablet (50 mg total) by mouth daily. with food, Disp: 90 tablet, Rfl: 3   Multiple Vitamin (MULITIVITAMIN WITH MINERALS) TABS, Take 1 tablet by mouth daily. Centrum Silver, Disp: , Rfl:    nystatin  cream (MYCOSTATIN ), APPLY TO AFFECTED AREA(S) TWICE DAILY AS NEEDED FOR DRY SKIN UNDER BREASTS, Disp: 30 g, Rfl: 10   pantoprazole  (PROTONIX ) 20 MG tablet, TAKE 1 TABLET BY MOUTH DAILY AS  NEEDED FOR HEARTBURN, Disp: 30 tablet, Rfl: 10   tiZANidine  (ZANAFLEX ) 4 MG tablet, TAKE 1 TABLET (4 MG TOTAL) BY MOUTH DAILY AS NEEDED FOR BACK PAIN, Disp: 30 tablet, Rfl: 1   vitamin C (ASCORBIC ACID) 500 MG tablet, Take 500 mg by mouth daily., Disp: , Rfl:    digoxin  (LANOXIN ) 0.125 MG tablet, Take 1 tablet (125 mcg total) by mouth daily. Please keep your upcoming appointment for refills., Disp: 90 tablet, Rfl: 2   Semaglutide ,0.25 or 0.5MG /DOS, (OZEMPIC , 0.25 OR 0.5 MG/DOSE,) 2 MG/3ML SOPN, INJECT 0.25MG  SUBCUTANEOUSLY EVERY TUESDAY., Disp: 3 mL, Rfl: 11   Allergies  Allergen Reactions   Codeine Hives and Nausea And Vomiting   Augmentin  [Amoxicillin -Pot Clavulanate] Diarrhea   Coreg  [Carvedilol ] Other (See Comments)    No energy   Warfarin And Related Other (See Comments)    Severe rectal bleeding     Review of Systems  Constitutional: Negative.   Eyes:  Negative for blurred vision.  Respiratory: Negative.    Cardiovascular: Negative.   Gastrointestinal: Negative.   Neurological: Negative.   Psychiatric/Behavioral: Negative.       Today's Vitals   12/04/23 1439  BP: 118/70  Pulse: 80  Temp: 97.9 F (36.6 C)  SpO2: 98%   Weight: 172 lb 3.2 oz (78.1 kg)  Height: 5' 7 (1.702 m)   Body mass index is 26.97 kg/m.  Wt Readings from Last 3 Encounters:  12/04/23 172 lb 3.2 oz (78.1 kg)  09/03/23 172 lb 3.2 oz (78.1 kg)  05/29/23 174 lb (78.9 kg)     Objective:  Physical Exam Vitals and nursing note reviewed.  Constitutional:      Appearance: Normal appearance.  HENT:     Head: Normocephalic and atraumatic.  Eyes:     Extraocular Movements: Extraocular movements intact.  Cardiovascular:     Rate and Rhythm: Normal rate and regular rhythm.     Heart sounds: Normal heart sounds.  Pulmonary:     Effort: Pulmonary effort is normal.     Breath sounds: Normal breath sounds.  Musculoskeletal:     Cervical back: Normal range of motion.     Comments: Ambulatory with cane  Skin:    General: Skin is warm.  Neurological:     General: No focal deficit present.     Mental Status: She is alert.  Psychiatric:        Mood and Affect: Mood normal.        Behavior: Behavior normal.         Assessment And Plan:  Type 2 diabetes mellitus with stage 3b chronic kidney disease, with long-term current use of insulin  (HCC) Assessment & Plan: Chronic, blood glucose level is 89 mg/dL. She declined diabetes management sensor due to aesthetic concerns. Discussed cost of test strips and alternatives. - Continue current diabetes management. - Discuss cost of test strips and potential alternatives.  CKD is Managed by nephrologist with annual visits, indicating well-managed kidney function. - Continue annual follow-up with nephrologist.  Orders: -     CBC -     CMP14+EGFR -     Hemoglobin A1c  Diabetic peripheral vascular disorder [facility]) Assessment & Plan: Chronic, importance of daily activity was discussed with the patient.  - Encouraged to incorporate chair exercises into her daily routine.   Hypertensive heart and renal disease with renal failure, stage 1 through stage 4 or unspecified chronic kidney disease,  with heart failure (HCC) -     Ozempic  (0.25 or 0.5  MG/DOSE); INJECT 0.25MG  SUBCUTANEOUSLY EVERY TUESDAY.  Dispense: 3 mL; Refill: 11  Chronic combined systolic and diastolic heart failure (HCC) Assessment & Plan: Chronic, currently on metoprolol  XL, enalapril , digoxin  and furosemide . Importance of medication and dietary compliance was stressed to the patient.    Atherosclerosis of native artery of left lower extremity with rest pain [facility]) Assessment & Plan: Chronic, LDL goal is less than 70. Optimal level would be less than 55. - ensure statin compliance - Continue with ASA therapy   Pure hypercholesterolemia Assessment & Plan: Chronic, on Livalo , requires refill. Pharmacy change to [facility] for better management. - Refill Livalo  prescription. - Change pharmacy to [facility].   Paroxysmal atrial fibrillation (HCC) -     Digoxin ; Take 1 tablet (125 mcg total) by mouth daily. Please keep your upcoming appointment for refills.  Dispense: 90 tablet; Refill: 2  Secondary hypercoagulable state [facility]) Assessment & Plan: She is currently on Eliquis  due to underlying PAF.    Other emphysema [facility]) Assessment & Plan: Emphysematous changes seen on Jan 2023 LDCT.  She has since been evaluated by Pulmonary. Importance of smoking cessation was stressed to the patient.    Drug therapy -     Digoxin  level  General Health Maintenance Planning transportation for colonoscopy with Doctor Rollin. Cholesterol checked in April and kidney function in May. - Arrange transportation for colonoscopy. - Ensure follow-up on cholesterol and kidney function as needed.  Return if symptoms worsen or fail to improve.  Patient was given opportunity to ask questions. Patient verbalized understanding of the plan and was able to repeat key elements of the plan. All questions were answered to their satisfaction.   I, Catheryn LOISE Slocumb, MD, have reviewed all documentation for this visit. The documentation on  12/04/23 for the exam, diagnosis, procedures, and orders are all accurate and complete.   IF YOU HAVE BEEN REFERRED TO A SPECIALIST, IT MAY TAKE 1-2 WEEKS TO SCHEDULE/PROCESS THE REFERRAL. IF YOU HAVE NOT HEARD FROM US /SPECIALIST IN TWO WEEKS, PLEASE GIVE US  A CALL AT [phone].   THE PATIENT IS ENCOURAGED TO PRACTICE SOCIAL DISTANCING DUE TO THE COVID-19 PANDEMIC.

## 2023-12-04 NOTE — Patient Instructions (Signed)

## 2023-12-08 ENCOUNTER — Other Ambulatory Visit (HOSPITAL_COMMUNITY): Payer: Self-pay

## 2023-12-09 ENCOUNTER — Other Ambulatory Visit: Payer: Self-pay

## 2023-12-09 LAB — CMP14+EGFR
ALT: 12 IU/L (ref 0–32)
AST: 15 IU/L (ref 0–40)
Albumin: 4.4 g/dL (ref 3.8–4.8)
Alkaline Phosphatase: 71 IU/L (ref 44–121)
BUN/Creatinine Ratio: 24 (ref 12–28)
BUN: 40 mg/dL — ABNORMAL HIGH (ref 8–27)
Bilirubin Total: 0.3 mg/dL (ref 0.0–1.2)
CO2: 21 mmol/L (ref 20–29)
Calcium: 9.7 mg/dL (ref 8.7–10.3)
Chloride: 104 mmol/L (ref 96–106)
Creatinine, Ser: 1.68 mg/dL — ABNORMAL HIGH (ref 0.57–1.00)
Globulin, Total: 2.4 g/dL (ref 1.5–4.5)
Glucose: 86 mg/dL (ref 70–99)
Potassium: 4.6 mmol/L (ref 3.5–5.2)
Sodium: 140 mmol/L (ref 134–144)
Total Protein: 6.8 g/dL (ref 6.0–8.5)
eGFR: 31 mL/min/1.73 — ABNORMAL LOW (ref >59)

## 2023-12-09 LAB — CBC
Hematocrit: 37.3 % (ref 34.0–46.6)
Hemoglobin: 11.9 g/dL (ref 11.1–15.9)
MCH: 27 pg (ref 26.6–33.0)
MCHC: 31.9 g/dL (ref 31.5–35.7)
MCV: 85 fL (ref 79–97)
Platelets: 214 x10E3/uL (ref 150–450)
RBC: 4.4 x10E6/uL (ref 3.77–5.28)
RDW: 14.6 % (ref 11.7–15.4)
WBC: 6.6 x10E3/uL (ref 3.4–10.8)

## 2023-12-09 LAB — HEMOGLOBIN A1C
Est. average glucose Bld gHb Est-mCnc: 117 mg/dL
Hgb A1c MFr Bld: 5.7 % — ABNORMAL HIGH (ref 4.8–5.6)

## 2023-12-09 LAB — DIGOXIN LEVEL: Digoxin, Serum: <0.4 ng/mL — ABNORMAL LOW (ref 0.5–0.9)

## 2023-12-10 ENCOUNTER — Ambulatory Visit: Payer: Self-pay | Admitting: Internal Medicine

## 2023-12-11 ENCOUNTER — Other Ambulatory Visit (HOSPITAL_COMMUNITY): Payer: Self-pay

## 2023-12-14 DIAGNOSIS — E1151 Type 2 diabetes mellitus with diabetic peripheral angiopathy without gangrene: Secondary | ICD-10-CM | POA: Insufficient documentation

## 2023-12-14 NOTE — Assessment & Plan Note (Addendum)
 Chronic, blood glucose level is 89 mg/dL. She declined diabetes management sensor due to aesthetic concerns. Discussed cost of test strips and alternatives. - Continue current diabetes management. - Discuss cost of test strips and potential alternatives.  CKD is Managed by nephrologist with annual visits, indicating well-managed kidney function. - Continue annual follow-up with nephrologist.

## 2023-12-14 NOTE — Assessment & Plan Note (Signed)
Chronic, currently on metoprolol XL, enalapril, digoxin and furosemide. Importance of medication and dietary compliance was stressed to the patient.

## 2023-12-14 NOTE — Assessment & Plan Note (Signed)
 Chronic, on Livalo , requires refill. Pharmacy change to [facility] for better management. - Refill Livalo  prescription. - Change pharmacy to [facility].

## 2023-12-14 NOTE — Assessment & Plan Note (Signed)
 Chronic, importance of daily activity was discussed with the patient.  - Encouraged to incorporate chair exercises into her daily routine.

## 2023-12-14 NOTE — Assessment & Plan Note (Signed)
 Chronic, LDL goal is less than 70. Optimal level would be less than 55. - ensure statin compliance - Continue with ASA therapy

## 2023-12-14 NOTE — Assessment & Plan Note (Signed)
 Missed appointment led to denial of digoxin  refill. Plan to check digoxin  level and refill prescription. Pharmacy change to [facility] for 90-day supply to improve management. - Check digoxin  level. - Refill digoxin  prescription. - Change pharmacy to [facility] for 90-day supply. - Ensure follow-up with cardiologist.

## 2023-12-14 NOTE — Assessment & Plan Note (Signed)
 Emphysematous changes seen on Jan 2023 LDCT.  She has since been evaluated by Pulmonary. Importance of smoking cessation was stressed to the patient.

## 2023-12-14 NOTE — Assessment & Plan Note (Signed)
She is currently on Eliquis due to underlying PAF.

## 2024-01-02 ENCOUNTER — Other Ambulatory Visit (HOSPITAL_COMMUNITY): Payer: Self-pay

## 2024-01-08 ENCOUNTER — Other Ambulatory Visit: Payer: Self-pay | Admitting: Internal Medicine

## 2024-01-08 DIAGNOSIS — I10 Essential (primary) hypertension: Secondary | ICD-10-CM

## 2024-02-05 ENCOUNTER — Other Ambulatory Visit (HOSPITAL_COMMUNITY): Payer: Self-pay

## 2024-02-12 ENCOUNTER — Other Ambulatory Visit: Payer: Self-pay

## 2024-02-12 ENCOUNTER — Other Ambulatory Visit (HOSPITAL_COMMUNITY): Payer: Self-pay

## 2024-02-12 MED ORDER — BD PEN NEEDLE NANO U/F 32G X 4 MM MISC
11 refills | Status: AC
  Filled 2024-02-12: qty 100, 90d supply, fill #0

## 2024-03-08 ENCOUNTER — Encounter: Payer: Self-pay | Admitting: Pharmacist

## 2024-03-08 NOTE — Progress Notes (Signed)
   03/08/2024  Patient ID: Ronal LITTIE Bend, female   DOB: Oct 25, 1945, 78 y.o.   MRN: [id number]  Received this letter from Dr. Jarold that was orginally sent by the Patient's insurance:    Patient's dose does not actually exceed Digxoin 0.125 mcg (1 tablet daily), she just had the digoxin  filled with some overlap from [facility] order pharmacy in September.  From review of fill data history from Dr. Annemarie, Patient is getting her medications through [facility] for 30 day supplies.  No action necessary.       12/04/2023    2:39 PM 09/03/2023    3:32 PM 05/29/2023    2:30 PM  Vitals with BMI  Height 5' 7 5' 7   Weight 172 lbs 3 oz 172 lbs 3 oz   BMI 26.96 26.96   Systolic 118 110 883  Diastolic 70 80 67  Pulse 80 94 67   Lipid Panel     Component Value Date/Time   CHOL 105 09/03/2023 1624   TRIG 162 (H) 09/03/2023 1624   HDL 35 (L) 09/03/2023 1624   CHOLHDL 3.0 09/03/2023 1624   CHOLHDL 4.8 06/27/2009 0328   VLDL 14 06/27/2009 0328   LDLCALC 43 09/03/2023 1624   LABVLDL 27 09/03/2023 1624    Lab Results  Component Value Date   HGBA1C 5.7 (H) 12/04/2023   HGBA1C 6.1 (H) 09/03/2023   HGBA1C 5.9 (H) 04/01/2023     Cassius DOROTHA Brought, PharmD, BCACP Clinical Pharmacist [phone]

## 2024-03-09 ENCOUNTER — Ambulatory Visit: Payer: Self-pay

## 2024-03-09 NOTE — Telephone Encounter (Signed)
 Tiffany, RN with [employer] called to report abnormal finding-Diminished breath sounds on the right. BP 100/60 and a non productive cough that sounds wet per [name]. Patient reports cough has been going on for 7-10 days. No SOB or CP. Did recommend being seen in office but patient refused at this time. Patient reports will watch her BP and was given ED precautions.   FYI Only or Action Required?: Action required by provider: update on patient condition.  Patient was last seen in primary care on 12/04/2023 by Jarold Medici, MD.  Called Nurse Triage reporting Cough.  Symptoms began several days ago.  Interventions attempted: Rest, hydration, or home remedies.  Symptoms are: unchanged.  Triage Disposition: No disposition on file.  Patient/caregiver understands and will follow disposition?:   Copied from CRM [id number]. Topic: Clinical - Medical Advice >> Mar 09, 2024  1:01 PM Gustabo D wrote: Annabella Ava Alfred michalene house calls - BP 100/60, deminished breath sounds, nonproductive cough started over the last week, Temp 97.3 Reason for Disposition  [1] Continuous (nonstop) coughing interferes with work or school AND [2] no improvement using cough treatment per Care Advice  Answer Assessment - Initial Assessment Questions 1. ONSET: When did the cough begin?      Started 7-10 days ago 2. SEVERITY: How bad is the cough today?      mild 3. SPUTUM: Describe the color of your sputum (e.g., none, dry cough; clear, white, yellow, green)     Dry cough 4. HEMOPTYSIS: Are you coughing up any blood? If Yes, ask: How much? (e.g., flecks, streaks, tablespoons, etc.)     no 5. DIFFICULTY BREATHING: Are you having difficulty breathing? If Yes, ask: How bad is it? (e.g., mild, moderate, severe)      no 6. FEVER: Do you have a fever? If Yes, ask: What is your temperature, how was it measured, and when did it start?     no 7. CARDIAC HISTORY: Do you have any history of  heart disease? (e.g., heart attack, congestive heart failure)      yes 8. LUNG HISTORY: Do you have any history of lung disease?  (e.g., pulmonary embolus, asthma, emphysema)     no 9. PE RISK FACTORS: Do you have a history of blood clots? (or: recent major surgery, recent prolonged travel, bedridden)     no 10. OTHER SYMPTOMS: Do you have any other symptoms? (e.g., runny nose, wheezing, chest pain)       Runny nose.  12. TRAVEL: Have you traveled out of the country in the last month? (e.g., travel history, exposures)       no  Protocols used: Cough - Acute Non-Productive-A-AH

## 2024-03-16 ENCOUNTER — Other Ambulatory Visit (HOSPITAL_BASED_OUTPATIENT_CLINIC_OR_DEPARTMENT_OTHER): Payer: Self-pay

## 2024-03-16 ENCOUNTER — Other Ambulatory Visit (HOSPITAL_COMMUNITY): Payer: Self-pay

## 2024-04-08 ENCOUNTER — Ambulatory Visit: Payer: Medicare Other | Admitting: Internal Medicine

## 2024-04-08 ENCOUNTER — Encounter: Payer: Self-pay | Admitting: Internal Medicine

## 2024-04-08 ENCOUNTER — Telehealth: Payer: Self-pay | Admitting: Pharmacist

## 2024-04-08 VITALS — BP 110/78 | HR 67 | Temp 98.3°F | Ht 67.0 in | Wt 156.4 lb

## 2024-04-08 DIAGNOSIS — I130 Hypertensive heart and chronic kidney disease with heart failure and stage 1 through stage 4 chronic kidney disease, or unspecified chronic kidney disease: Secondary | ICD-10-CM | POA: Diagnosis not present

## 2024-04-08 DIAGNOSIS — N1832 Chronic kidney disease, stage 3b: Secondary | ICD-10-CM

## 2024-04-08 DIAGNOSIS — E1122 Type 2 diabetes mellitus with diabetic chronic kidney disease: Secondary | ICD-10-CM | POA: Diagnosis not present

## 2024-04-08 DIAGNOSIS — Z0000 Encounter for general adult medical examination without abnormal findings: Secondary | ICD-10-CM | POA: Diagnosis not present

## 2024-04-08 DIAGNOSIS — E7800 Pure hypercholesterolemia, unspecified: Secondary | ICD-10-CM

## 2024-04-08 DIAGNOSIS — R634 Abnormal weight loss: Secondary | ICD-10-CM

## 2024-04-08 DIAGNOSIS — I480 Paroxysmal atrial fibrillation: Secondary | ICD-10-CM

## 2024-04-08 DIAGNOSIS — Z794 Long term (current) use of insulin: Secondary | ICD-10-CM | POA: Diagnosis not present

## 2024-04-08 DIAGNOSIS — I5032 Chronic diastolic (congestive) heart failure: Secondary | ICD-10-CM | POA: Diagnosis not present

## 2024-04-08 DIAGNOSIS — E1165 Type 2 diabetes mellitus with hyperglycemia: Secondary | ICD-10-CM

## 2024-04-08 DIAGNOSIS — R630 Anorexia: Secondary | ICD-10-CM

## 2024-04-08 DIAGNOSIS — Z23 Encounter for immunization: Secondary | ICD-10-CM | POA: Diagnosis not present

## 2024-04-08 DIAGNOSIS — E2839 Other primary ovarian failure: Secondary | ICD-10-CM

## 2024-04-08 DIAGNOSIS — I5042 Chronic combined systolic (congestive) and diastolic (congestive) heart failure: Secondary | ICD-10-CM

## 2024-04-08 DIAGNOSIS — E1151 Type 2 diabetes mellitus with diabetic peripheral angiopathy without gangrene: Secondary | ICD-10-CM

## 2024-04-08 DIAGNOSIS — Z79899 Other long term (current) drug therapy: Secondary | ICD-10-CM

## 2024-04-08 DIAGNOSIS — F17200 Nicotine dependence, unspecified, uncomplicated: Secondary | ICD-10-CM

## 2024-04-08 DIAGNOSIS — Z860101 Personal history of adenomatous and serrated colon polyps: Secondary | ICD-10-CM

## 2024-04-08 LAB — POCT URINALYSIS DIPSTICK
Bilirubin, UA: NEGATIVE
Blood, UA: NEGATIVE
Glucose, UA: NEGATIVE
Ketones, UA: NEGATIVE
Leukocytes, UA: NEGATIVE
Nitrite, UA: NEGATIVE
Protein, UA: POSITIVE — AB
Spec Grav, UA: 1.02 (ref 1.010–1.025)
Urobilinogen, UA: 0.2 U/dL
pH, UA: 6 (ref 5.0–8.0)

## 2024-04-08 NOTE — Progress Notes (Unsigned)
 I,[name], CMA,acting as a neurosurgeon for Shelby LOISE Slocumb, MD.,have documented all relevant documentation on the behalf of Shelby LOISE Slocumb, MD,as directed by  Shelby LOISE Slocumb, MD while in the presence of Shelby LOISE Slocumb, MD.  Subjective:    Patient ID: [name] , female    DOB: [birth year] , 78 y.o.   MRN: [id number]  Chief Complaint  Patient presents with   Annual Exam    She is here today for a full physical examination.  She reports compliance with medications. Denies headache, chest pain & sob. She denies completing dm eye exam & colonoscopy.    Hypertension   Diabetes   Hyperlipidemia    HPI Discussed the use of AI scribe software for clinical note transcription with the patient, who gave verbal consent to proceed.  History of Present Illness [name] is a 78 year old female with diabetes who presents with concerns about weight loss.  She has experienced a significant weight loss of 16 pounds since July. Despite eating, she does not feel hungry and reports that her clothes, particularly a skirt, have become too loose. She feels full quicker than usual. She reports no blood in her stools and no swelling in her legs.  She has a history of diabetes and is currently on Ozempic , taking 0.25 mg, but has not received it for over a month due to issues with the patient assistance program. Her medications, including Ozempic , were previously delivered from [place] . She is also on Eliquis  5 mg twice a day, digoxin  daily, enalapril  5 mg daily, ezetimibe  10 mg daily, Lasix  40 mg as needed, Livalo  2 mg on Monday, Wednesday, and Friday, metoprolol  50 mg daily, and pantoprazole  as needed for heartburn.  She has not had her eye exam this year and is looking for a new optometrist closer to her location. She had polyps found during a colonoscopy in 2022. She has not had a low dose CT scan last year due to cost concerns and dissatisfaction with the imaging center. She continues to  smoke.  She reports difficulty walking and sometimes feels like she might pass out, which has led her to use [employer] for groceries. She mentions a fall in her front yard while trying to get a box. She has not been to the vascular doctor for a procedure. No recent EKG and has not seen Dr. Raford. She has difficulty accessing MyChart and has not been checking her blood sugars regularly. She has not received her flu shot yet.   Diabetes She presents for her follow-up diabetic visit. She has type 2 diabetes mellitus. Her disease course has been stable. There are no hypoglycemic associated symptoms. Pertinent negatives for diabetes include no blurred vision. There are no hypoglycemic complications. Diabetic complications include nephropathy. Risk factors for coronary artery disease include diabetes mellitus, dyslipidemia, hypertension, obesity, post-menopausal and sedentary lifestyle. Meal planning includes avoidance of concentrated sweets. She participates in exercise intermittently. An ACE inhibitor/angiotensin II receptor blocker is being taken. Eye exam is current.  Hypertension This is a chronic problem. The current episode started more than 1 year ago. The problem has been gradually improving since onset. The problem is controlled. Pertinent negatives include no blurred vision. Risk factors for coronary artery disease include diabetes mellitus, dyslipidemia, obesity, post-menopausal state and sedentary lifestyle. The current treatment provides moderate improvement. Compliance problems include exercise.      Past Medical History:  Diagnosis Date   Arthritis    left knee (03/12/2013)  Atrial fibrillation (HCC)    Cardioverted to NSR 08/22/2009   Atrial tachycardia 09/30/2019   Cardiomyopathy    CHF (congestive heart failure) (HCC)    Chronic combined systolic and diastolic heart failure (HCC) 04/09/2021   CKD (chronic kidney disease)    Depression    Dizziness    DM2 (diabetes mellitus,  type 2) (HCC)    fasting 80-100 (03/12/2013)   Drug therapy 1/11   coumadin    Ejection fraction < 50%    20-25%, echo.06/2009, /  EF 30-35%, diffuse hypokinesis, echo, Oct 16, 2010   GERD (gastroesophageal reflux disease)    Gout    left knee (03/12/2013)   History of bronchitis    History of emphysema (HCC)    HLD (hyperlipidemia)    Hyperkalemia    Hypertension    LFT elevation    06/2009   Mitral regurgitation    mild - echo - 1/11 /  Mild, echo, May15, 2012   Overweight(278.02)    Pneumonia    @ least twice (03/12/2013)   Thyroid  dysfunction    thyromegally,diffuse,nodule LLL,06/2009   Tobacco abuse    Tricuspid regurgitation    moderate - echo - 1/11     Family History  Problem Relation Age of Onset   Stroke Mother    Prostate cancer Father    Hypertension Sister    Stroke Maternal Grandfather    Stroke Paternal Grandmother    Heart attack Son    Hypertension Son        X2   Colon cancer Other    Heart disease Other    Breast cancer Neg Hx      Current Outpatient Medications:    acetaminophen  (TYLENOL ) 500 MG tablet, Take 1,000 mg by mouth 3 (three) times daily as needed for pain., Disp: , Rfl:    apixaban  (ELIQUIS ) 5 MG TABS tablet, Take 1 tablet (5 mg total) by mouth 2 (two) times daily., Disp: 180 tablet, Rfl: 2   betamethasone  valerate ointment (VALISONE ) 0.1 %, Apply 1 Application topically 2 (two) times daily. prn (Patient taking differently: Apply 1 Application topically daily as needed (Dry skin).), Disp: 30 g, Rfl: 0   Cholecalciferol (VITAMIN D) 2000 UNITS CAPS, Take 2,000 Units by mouth daily., Disp: , Rfl:    diclofenac  Sodium (VOLTAREN ) 1 % GEL, Apply 4 g topically 4 (four) times daily. (Patient taking differently: Apply 4 g topically 4 (four) times daily as needed (pain).), Disp: 100 g, Rfl: 0   digoxin  (LANOXIN ) 0.125 MG tablet, Take 1 tablet (125 mcg total) by mouth daily. Please keep your upcoming appointment for refills., Disp: 90 tablet, Rfl:  2   enalapril  (VASOTEC ) 5 MG tablet, TAKE 1 TABLET BY MOUTH DAILY AT BEDTIME *PATIENT NEEDS APPOINTMENT*, Disp: 90 tablet, Rfl: 11   ezetimibe  (ZETIA ) 10 MG tablet, Take 1 tablet (10 mg total) by mouth daily., Disp: 90 tablet, Rfl: 2   furosemide  (LASIX ) 40 MG tablet, TAKE 1 TABLET BY MOUTH AS NEEDED FOR EDEMA OR FLUID, Disp: 30 tablet, Rfl: 11   glucose blood (ONETOUCH VERIO) test strip, USE TO TEST BLOOD SUGAR ONCE DAILY AS DIRECTED, Disp: 50 strip, Rfl: 10   Insulin  Pen Needle (BD PEN NEEDLE NANO U/F) 32G X 4 MM MISC, USE ONCE DAILY AS NEEDED., Disp: 100 each, Rfl: 11   KLOR-CON  M20 20 MEQ tablet, TAKE 1 TABLET (20 MEQ TOTAL) BY MOUTH DAILY. WHEN TAKING FUROSEMIDE  (LASIX )., Disp: 90 tablet, Rfl: 2   LIVALO  2 MG TABS,  TAKE 1 TABLET BY MOUTH EVERY DAY (Patient taking differently: Take 1 tablet by mouth every Monday, Wednesday, and Friday.), Disp: 90 tablet, Rfl: 2   metoprolol  succinate (TOPROL -XL) 50 MG 24 hr tablet, Take 1 tablet (50 mg total) by mouth daily. with food, Disp: 90 tablet, Rfl: 3   Multiple Vitamin (MULITIVITAMIN WITH MINERALS) TABS, Take 1 tablet by mouth daily. Centrum Silver, Disp: , Rfl:    nystatin  cream (MYCOSTATIN ), APPLY TO AFFECTED AREA(S) TWICE DAILY AS NEEDED FOR DRY SKIN UNDER BREAST, Disp: 30 g, Rfl: 11   pantoprazole  (PROTONIX ) 20 MG tablet, TAKE 1 TABLET BY MOUTH DAILY AS NEEDED FOR HEARTBURN, Disp: 30 tablet, Rfl: 10   Semaglutide ,0.25 or 0.5MG /DOS, (OZEMPIC , 0.25 OR 0.5 MG/DOSE,) 2 MG/3ML SOPN, INJECT 0.25MG  SUBCUTANEOUSLY EVERY TUESDAY., Disp: 3 mL, Rfl: 11   tiZANidine  (ZANAFLEX ) 4 MG tablet, TAKE 1 TABLET (4 MG TOTAL) BY MOUTH DAILY AS NEEDED FOR BACK PAIN, Disp: 30 tablet, Rfl: 1   vitamin C (ASCORBIC ACID) 500 MG tablet, Take 500 mg by mouth daily., Disp: , Rfl:    Allergies  Allergen Reactions   Codeine Hives and Nausea And Vomiting   Augmentin  [Amoxicillin -Pot Clavulanate] Diarrhea   Coreg  [Carvedilol ] Other (See Comments)    No energy   Warfarin And  Related Other (See Comments)    Severe rectal bleeding      The patient states she uses {contraceptive methods:5051} for birth control. No LMP recorded. Patient has had a hysterectomy.. {Dysmenorrhea-menorrhagia:21918}. Negative for: breast discharge, breast lump(s), breast pain and breast self exam. Associated symptoms include abnormal vaginal bleeding. Pertinent negatives include abnormal bleeding (hematology), anxiety, decreased libido, depression, difficulty falling sleep, dyspareunia, history of infertility, nocturia, sexual dysfunction, sleep disturbances, urinary incontinence, urinary urgency, vaginal discharge and vaginal itching. Diet regular.The patient states her exercise level is    . The patient's tobacco use is:  Social History   Tobacco Use  Smoking Status Every Day   Current packs/day: 0.50   Average packs/day: 0.5 packs/day for 40.0 years (20.0 ttl pk-yrs)   Types: Cigarettes  Smokeless Tobacco Never  Tobacco Comments   smoked 1.5ppd x 9, 1ppdx40, cut back to 1/2ppd past 5 years  . She has been exposed to passive smoke. The patient's alcohol use is:  Social History   Substance and Sexual Activity  Alcohol Use No  . Additional information: Last pap ***, next one scheduled for ***.    Review of Systems  Constitutional:  Positive for unexpected weight change.       She admits she doesn't always eat well. Often doesn't have appetite.   HENT: Negative.    Eyes: Negative.  Negative for blurred vision.  Respiratory: Negative.    Cardiovascular: Negative.   Gastrointestinal: Negative.   Endocrine: Negative.   Genitourinary: Negative.   Musculoskeletal: Negative.   Skin: Negative.   Allergic/Immunologic: Negative.   Neurological: Negative.   Hematological: Negative.   Psychiatric/Behavioral: Negative.       Today's Vitals   04/08/24 1407  BP: 110/78  Pulse: 67  Temp: 98.3 F (36.8 C)  SpO2: 98%  Weight: 156 lb 6.4 oz (70.9 kg)  Height: 5' 7 (1.702 m)    Body mass index is 24.5 kg/m.  Wt Readings from Last 3 Encounters:  04/08/24 156 lb 6.4 oz (70.9 kg)  12/04/23 172 lb 3.2 oz (78.1 kg)  09/03/23 172 lb 3.2 oz (78.1 kg)     Objective:  Physical Exam Vitals and nursing note reviewed.  Constitutional:  Appearance: Normal appearance.     Comments: She kept pants, shoes/socks on and sat in chair for exam  HENT:     Head: Normocephalic and atraumatic.     Comments: Temporal wasting    Right Ear: Tympanic membrane, ear canal and external ear normal.     Left Ear: Tympanic membrane, ear canal and external ear normal.     Nose: Nose normal.     Mouth/Throat:     Mouth: Mucous membranes are moist.     Pharynx: Oropharynx is clear.  Eyes:     Extraocular Movements: Extraocular movements intact.     Conjunctiva/sclera: Conjunctivae normal.     Pupils: Pupils are equal, round, and reactive to light.  Cardiovascular:     Rate and Rhythm: Normal rate and regular rhythm.     Pulses: Normal pulses.     Heart sounds: Normal heart sounds.  Pulmonary:     Effort: Pulmonary effort is normal.     Breath sounds: Normal breath sounds.  Chest:  Breasts:    Tanner Score is 5.     Right: Normal.     Left: Normal.     Comments: Pendulous Abdominal:     General: Abdomen is flat. Bowel sounds are normal.     Palpations: Abdomen is soft.  Genitourinary:    Comments: deferred Musculoskeletal:        General: Normal range of motion.     Cervical back: Normal range of motion and neck supple.  Feet:     Comments: Not examined, she kept shoes on Skin:    General: Skin is warm and dry.  Neurological:     General: No focal deficit present.     Mental Status: She is alert and oriented to person, place, and time.  Psychiatric:        Mood and Affect: Mood normal.        Behavior: Behavior normal.         Assessment And Plan:     Encounter for general adult medical examination w/o abnormal findings Assessment & Plan: A full exam was  performed.  Importance of monthly self breast exams was discussed with the patient.  She is advised to get 30-45 minutes of regular exercise, no less than four to five days per week. Both weight-bearing and aerobic exercises are recommended.  She is advised to follow a healthy diet with at least six fruits/veggies per day, decrease intake of red meat and other saturated fats and to increase fish intake to twice weekly.  Meats/fish should not be fried -- baked, boiled or broiled is preferable. It is also important to cut back on your sugar intake.  Be sure to read labels - try to avoid anything with added sugar, high fructose corn syrup or other sweeteners.  If you must use a sweetener, you can try stevia or monkfruit.  It is also important to avoid artificially sweetened foods/beverages and diet drinks. Lastly, wear SPF 50 sunscreen on exposed skin and when in direct sunlight for an extended period of time.  Be sure to avoid fast food restaurants and aim for at least 60 ounces of water daily.       Hypertensive heart and renal disease with renal failure, stage 1 through stage 4 or unspecified chronic kidney disease, with heart failure (HCC) -     POCT urinalysis dipstick -     Microalbumin / creatinine urine ratio -     EKG 12-Lead -  CBC -     CMP14+EGFR -     Lipid panel -     TSH  Type 2 diabetes mellitus with stage 3b chronic kidney disease, with long-term current use of insulin  (HCC) -     CBC -     CMP14+EGFR -     Lipid panel -     Hemoglobin A1c -     TSH  Pure hypercholesterolemia  Paroxysmal atrial fibrillation (HCC) -     Digoxin  level  Diabetic peripheral vascular disorder (HCC)  Immunization due -     Flu vaccine HIGH DOSE PF(Fluzone Trivalent)  Personal history of adenomatous and serrated colon polyps  Estrogen deficiency  Tobacco use disorder -     CT CHEST LUNG CANCER SCREENING LOW DOSE WO CONTRAST; Future  Decreased appetite -     Prealbumin  Drug  therapy -     Vitamin B12   Assessment & Plan Adult Wellness Visit Routine wellness visit with emphasis on regular screenings and vaccinations. - Ordered bone density scan at [facility]. - Encouraged scheduling of eye exam with Dr. Cyrus. - Encouraged scheduling of mammogram at the breast center.  Type 2 diabetes mellitus with diabetic chronic kidney disease and diabetic peripheral angiopathy Diabetes management complicated by unintentional weight loss and potential medication issues with Ozempic . Concerns about medication access and patient assistance program changes. - Checked availability of Ozempic  at the clinic. - Provided sample of Ozempic . - Encouraged checking with insurance regarding Ozempic  cost. - Discussed potential need to stop Ozempic  if weight loss continues.  Hypertensive heart and chronic kidney disease with heart failure - Continue current medications: Eliquis , digoxin , enalapril , Lasix , metoprolol .  Paroxysmal atrial fibrillation - Continue current medications: Eliquis , digoxin , metoprolol .  Pure hypercholesterolemia - Continue current medications: ezetimibe , Livalo .  Personal history of adenomatous and serrated colon polyps Previous colonoscopy in 2022 showed polyps. Due for repeat colonoscopy this year. - Ordered repeat colonoscopy.   Return for 1 year HM . Patient was given opportunity to ask questions. Patient verbalized understanding of the plan and was able to repeat key elements of the plan. All questions were answered to their satisfaction.   I, Shelby LOISE Slocumb, MD, have reviewed all documentation for this visit. The documentation on 04/08/24 for the exam, diagnosis, procedures, and orders are all accurate and complete.

## 2024-04-08 NOTE — Assessment & Plan Note (Signed)
Chronic, rate controlled with metoprolol XL 50mg  daily and anticoagulated with Eliquis.

## 2024-04-08 NOTE — Assessment & Plan Note (Signed)

## 2024-04-08 NOTE — Assessment & Plan Note (Signed)
 Continued smoking despite previous discussions about cessation.  - Ordered low dose CT scan and discussed cost with her.

## 2024-04-08 NOTE — Assessment & Plan Note (Signed)
 Significant weight loss of 16 pounds since July. Concerns about potential underlying causes and nutritional intake. Discussion about potential need for medication to aid weight gain. - Will consider medication to aid weight gain if weight loss continues. - She was given info on Blessed Table - She was given samples of Boost/Ensure/Glucerna

## 2024-04-08 NOTE — Patient Instructions (Signed)

## 2024-04-08 NOTE — Progress Notes (Signed)
   04/08/2024  Patient ID: [name], female   DOB: 17-Sep-1945, 78 y.o.   MRN: [id number]  Receive a message from Dr. Jarold while she was seeing the Patient in the clinic inquiring about Patient Assistance for Ozempic  through [employer].  I documented earlier in the year that the patient is/was dually covered--[place] Medicaid and Medicare.  The last Pharmacy to fill Ozempic  for the Patient was [facility] in [place]  .  [facility] was called. The representative said Ozempic  had a $0 copay for the Patient.  Provider was notified.  Since Patient is dually-eligible, she does not qualify for Patient assistance programming.  In addition, [employer] will not be offering Ozempic  in their Patient Assistance program.   Lab Results  Component Value Date   HGBA1C 5.7 (H) 12/04/2023   HGBA1C 6.1 (H) 09/03/2023   HGBA1C 5.9 (H) 04/01/2023    [name], PharmD, BCACP Clinical Pharmacist [phone]

## 2024-04-09 LAB — TSH: TSH: 1.45 u[IU]/mL (ref 0.450–4.500)

## 2024-04-09 LAB — LIPID PANEL
Chol/HDL Ratio: 3.2 ratio (ref 0.0–4.4)
Cholesterol, Total: 121 mg/dL (ref 100–199)
HDL: 38 mg/dL — ABNORMAL LOW (ref >39)
LDL Chol Calc (NIH): 54 mg/dL (ref 0–99)
Triglycerides: 171 mg/dL — ABNORMAL HIGH (ref 0–149)
VLDL Cholesterol Cal: 29 mg/dL (ref 5–40)

## 2024-04-09 LAB — VITAMIN B12: Vitamin B-12: >2000 pg/mL — ABNORMAL HIGH (ref 232–1245)

## 2024-04-09 LAB — CMP14+EGFR
ALT: 11 IU/L (ref 0–32)
AST: 18 IU/L (ref 0–40)
Albumin: 4.4 g/dL (ref 3.8–4.8)
Alkaline Phosphatase: 76 IU/L (ref 49–135)
BUN/Creatinine Ratio: 19 (ref 12–28)
BUN: 32 mg/dL — ABNORMAL HIGH (ref 8–27)
Bilirubin Total: 0.4 mg/dL (ref 0.0–1.2)
CO2: 24 mmol/L (ref 20–29)
Calcium: 10.2 mg/dL (ref 8.7–10.3)
Chloride: 96 mmol/L (ref 96–106)
Creatinine, Ser: 1.69 mg/dL — ABNORMAL HIGH (ref 0.57–1.00)
Globulin, Total: 2.6 g/dL (ref 1.5–4.5)
Glucose: 90 mg/dL (ref 70–99)
Potassium: 4.1 mmol/L (ref 3.5–5.2)
Sodium: 138 mmol/L (ref 134–144)
Total Protein: 7 g/dL (ref 6.0–8.5)
eGFR: 31 mL/min/1.73 — ABNORMAL LOW (ref >59)

## 2024-04-09 LAB — CBC
Hematocrit: 38.8 % (ref 34.0–46.6)
Hemoglobin: 11.9 g/dL (ref 11.1–15.9)
MCH: 26.6 pg (ref 26.6–33.0)
MCHC: 30.7 g/dL — ABNORMAL LOW (ref 31.5–35.7)
MCV: 87 fL (ref 79–97)
Platelets: 247 x10E3/uL (ref 150–450)
RBC: 4.48 x10E6/uL (ref 3.77–5.28)
RDW: 14.2 % (ref 11.7–15.4)
WBC: 8.8 x10E3/uL (ref 3.4–10.8)

## 2024-04-09 LAB — MICROALBUMIN / CREATININE URINE RATIO
Creatinine, Urine: 180.4 mg/dL
Microalb/Creat Ratio: 22 mg/g{creat} (ref 0–29)
Microalbumin, Urine: 39.9 ug/mL

## 2024-04-09 LAB — HEMOGLOBIN A1C
Est. average glucose Bld gHb Est-mCnc: 108 mg/dL
Hgb A1c MFr Bld: 5.4 % (ref 4.8–5.6)

## 2024-04-09 LAB — PREALBUMIN: PREALBUMIN: 22 mg/dL (ref 9–32)

## 2024-04-09 LAB — DIGOXIN LEVEL: Digoxin, Serum: 0.7 ng/mL (ref 0.5–0.9)

## 2024-04-14 ENCOUNTER — Ambulatory Visit: Payer: Self-pay | Admitting: Internal Medicine

## 2024-04-18 DIAGNOSIS — Z860101 Personal history of adenomatous and serrated colon polyps: Secondary | ICD-10-CM | POA: Insufficient documentation

## 2024-04-18 NOTE — Assessment & Plan Note (Signed)
 Previous colonoscopy in 2022 showed polyps. Due for repeat colonoscopy this year. - Ordered repeat colonoscopy.

## 2024-04-18 NOTE — Assessment & Plan Note (Addendum)
 Chronic, LDL goal is less than 70.  She will continue with Livalo  and ezetimibe . Pharmacy change to [facility] for better management. - Refill Livalo  prescription. - Change pharmacy to [facility].

## 2024-04-18 NOTE — Assessment & Plan Note (Addendum)
 Chronic, diabetic foot exam was performed.  Diabetes management complicated by unintentional weight loss and potential medication issues with Ozempic . Concerns about medication access and patient assistance program changes. - Checked availability of Ozempic  at the clinic. - However, due to extensive weight loss, may need to change to every other week dosing.  - I DISCUSSED WITH THE PATIENT AT LENGTH REGARDING THE GOALS OF GLYCEMIC CONTROL AND POSSIBLE LONG-TERM COMPLICATIONS.  I  ALSO STRESSED THE IMPORTANCE OF COMPLIANCE WITH HOME GLUCOSE MONITORING, DIETARY RESTRICTIONS INCLUDING AVOIDANCE OF SUGARY DRINKS/PROCESSED FOODS,  ALONG WITH REGULAR EXERCISE.  I  ALSO STRESSED THE IMPORTANCE OF ANNUAL EYE EXAMS, SELF FOOT CARE AND COMPLIANCE WITH OFFICE VISITS.

## 2024-04-18 NOTE — Assessment & Plan Note (Addendum)
 Chronic, well controlled.  EKG performed, SB w/low voltage, widened QRS. - Continue current medications: Eliquis , digoxin , enalapril , Lasix , metoprolol . - Follow low sodium diet.

## 2024-04-18 NOTE — Assessment & Plan Note (Signed)
 Chronic, importance of daily activity was discussed with the patient.  - Encouraged to incorporate chair exercises into her daily routine.

## 2024-04-19 NOTE — Assessment & Plan Note (Signed)
Chronic, currently on metoprolol XL, enalapril, digoxin and furosemide. Importance of medication and dietary compliance was stressed to the patient.

## 2024-04-26 ENCOUNTER — Other Ambulatory Visit: Payer: Self-pay

## 2024-04-26 ENCOUNTER — Other Ambulatory Visit: Payer: Self-pay | Admitting: Internal Medicine

## 2024-04-26 DIAGNOSIS — I480 Paroxysmal atrial fibrillation: Secondary | ICD-10-CM

## 2024-04-26 DIAGNOSIS — I130 Hypertensive heart and chronic kidney disease with heart failure and stage 1 through stage 4 chronic kidney disease, or unspecified chronic kidney disease: Secondary | ICD-10-CM

## 2024-04-26 DIAGNOSIS — E1122 Type 2 diabetes mellitus with diabetic chronic kidney disease: Secondary | ICD-10-CM

## 2024-04-26 DIAGNOSIS — E1151 Type 2 diabetes mellitus with diabetic peripheral angiopathy without gangrene: Secondary | ICD-10-CM

## 2024-04-26 NOTE — Patient Outreach (Signed)
 Complex Care Management   Visit Note  04/26/2024  Name:  [name] MRN: [id number] DOB: [birth year]  Situation: Referral received for Complex Care Management related to Hypertensive heart and renal disease with renal failure, stage 1 through stage 4 or unspecified chronic disease, with heart failure, Paroxysmal Atrial fibrillation, type 2 diabetes mellitus with stage 3b chronic kidney disease, with long-term current use of insulin , Diabetic peripheral vascular disease. I obtained verbal consent from Patient.  Visit completed with Patient on the phone.  Background:   Past Medical History:  Diagnosis Date   Arthritis    left knee (03/12/2013)   Atrial fibrillation (HCC)    Cardioverted to NSR 08/22/2009   Atrial tachycardia 09/30/2019   Cardiomyopathy    CHF (congestive heart failure) (HCC)    Chronic combined systolic and diastolic heart failure (HCC) 04/09/2021   CKD (chronic kidney disease)    Depression    Dizziness    DM2 (diabetes mellitus, type 2) (HCC)    fasting 80-100 (03/12/2013)   Drug therapy 1/11   coumadin    Ejection fraction < 50%    20-25%, echo.06/2009, /  EF 30-35%, diffuse hypokinesis, echo, Oct 16, 2010   GERD (gastroesophageal reflux disease)    Gout    left knee (03/12/2013)   History of bronchitis    History of emphysema (HCC)    HLD (hyperlipidemia)    Hyperkalemia    Hypertension    LFT elevation    06/2009   Mitral regurgitation    mild - echo - 1/11 /  Mild, echo, May15, 2012   Overweight(278.02)    Pneumonia    @ least twice (03/12/2013)   Thyroid  dysfunction    thyromegally,diffuse,nodule LLL,06/2009   Tobacco abuse    Tricuspid regurgitation    moderate - echo - 1/11    Assessment: Patient Reported Symptoms:  Cognitive Cognitive Status: Able to follow simple commands, Normal speech and language skills Cognitive/Intellectual Conditions Management [RPT]: None reported or documented in medical history or problem list   Health  Maintenance Behaviors: Annual physical exam, Healthy diet Health Facilitated by: Healthy diet, Rest  Neurological Neurological Review of Symptoms: No symptoms reported    HEENT HEENT Symptoms Reported: Nasal discharge HEENT Management Strategies: Routine screening HEENT Self-Management Outcome: 3 (uncertain)    Cardiovascular Cardiovascular Symptoms Reported: Lightheadness Cardiovascular Management Strategies: Adequate rest, Medication therapy Weight: 148 lb 6.4 oz (67.3 kg) Cardiovascular Self-Management Outcome: 4 (good)  Respiratory Respiratory Symptoms Reported: No symptoms reported    Endocrine Endocrine Symptoms Reported: Hypoglycemia, Shakiness Is patient diabetic?: Yes Is patient checking blood sugars at home?: Yes List most recent blood sugar readings, include date and time of day: FBS today 90 Endocrine Self-Management Outcome: 4 (good)  Gastrointestinal Gastrointestinal Symptoms Reported: Change in appetite, Unintentional weight loss Gastrointestinal Management Strategies: Adequate rest, Diet modification Gastrointestinal Self-Management Outcome: 4 (good) Gastrointestinal Comment: today's weight 148.4 lbs    Genitourinary Genitourinary Symptoms Reported: Not assessed    Integumentary Integumentary Symptoms Reported: Not assessed    Musculoskeletal Musculoskelatal Symptoms Reviewed: Difficulty walking, Limited mobility Additional Musculoskeletal Details: s/p knee replacement Musculoskeletal Management Strategies: Adequate rest, Routine screening Musculoskeletal Self-Management Outcome: 4 (good) Falls in the past year?: Yes Number of falls in past year: 1 or less Was there an injury with Fall?: No Fall Risk Category Calculator: 1 Patient Fall Risk Level: Low Fall Risk Patient at Risk for Falls Due to: Impaired mobility, History of fall(s) Fall risk Follow up: Falls evaluation completed, Education provided  Psychosocial  Psychosocial Symptoms Reported: No symptoms  reported   Major Change/Loss/Stressor/Fears (CP): Denies Quality of Family Relationships: supportive    04/26/2024    PHQ2-9 Depression Screening   [name] interest or pleasure in doing things    Feeling down, depressed, or hopeless    PHQ-2 - Total Score    Trouble falling or staying asleep, or sleeping too much    Feeling tired or having [name] energy    Poor appetite or overeating     Feeling bad about yourself - or that you are a failure or have let yourself or your family down    Trouble concentrating on things, such as reading the newspaper or watching television    Moving or speaking so slowly that other people could have noticed.  Or the opposite - being so fidgety or restless that you have been moving around a lot more than usual    Thoughts that you would be better off dead, or hurting yourself in some way    PHQ2-9 Total Score    If you checked off any problems, how difficult have these problems made it for you to do your work, take care of things at home, or get along with other people    Depression Interventions/Treatment      Today's Vitals   04/26/24 1456  Weight: 148 lb 6.4 oz (67.3 kg)   Pain Scale: Not given for pain  Medications Reviewed Today     Reviewed by Morgan Clayborne CROME, RN (Registered Nurse) on 04/26/24 at 1438  Med List Status: <None>   Medication Order Taking? Sig Documenting Provider Last Dose Status Informant  acetaminophen  (TYLENOL ) 500 MG tablet [id number]  Take 1,000 mg by mouth 3 (three) times daily as needed for pain. [provider]  Active Self  apixaban  (ELIQUIS ) 5 MG TABS tablet [id number]  Take 1 tablet (5 mg total) by mouth 2 (two) times daily. Jarold Medici, MD  Active   betamethasone  valerate ointment (VALISONE ) 0.1 % [id number]  Apply 1 Application topically 2 (two) times daily. prn  Patient taking differently: Apply 1 Application topically daily as needed (Dry skin).   Jarold Medici, MD  Active Self  Cholecalciferol (VITAMIN D)  2000 UNITS CAPS [id number]  Take 2,000 Units by mouth daily. [provider]  Active Self  diclofenac  Sodium (VOLTAREN ) 1 % GEL [id number]  Apply 4 g topically 4 (four) times daily.  Patient taking differently: Apply 4 g topically 4 (four) times daily as needed (pain).   Emil Share, DO  Active Self  digoxin  (LANOXIN ) 0.125 MG tablet [id number]  Take 1 tablet (125 mcg total) by mouth daily. Please keep your upcoming appointment for refills. Jarold Medici, MD  Active   enalapril  (VASOTEC ) 5 MG tablet [id number]  TAKE 1 TABLET BY MOUTH DAILY AT BEDTIME *PATIENT NEEDS APPOINTMENTDEWAINE Jarold Medici, MD  Active   ezetimibe  (ZETIA ) 10 MG tablet [id number]  Take 1 tablet (10 mg total) by mouth daily. Jarold Medici, MD  Active   furosemide  (LASIX ) 40 MG tablet [id number]  TAKE 1 TABLET BY MOUTH AS NEEDED FOR EDEMA OR FLUID Jarold Medici, MD  Active   glucose blood [facility] VERIO) test strip [id number]  USE TO TEST BLOOD SUGAR ONCE DAILY AS DIRECTED Jarold Medici, MD  Active Self  Insulin  Pen Needle (BD PEN NEEDLE NANO U/F) 32G X 4 MM MISC [id number]  USE ONCE DAILY AS NEEDED. Jarold Medici, MD  Active   KLOR-CON  M20 20 MEQ tablet [id number]  TAKE 1 TABLET (20  MEQ TOTAL) BY MOUTH DAILY. WHEN TAKING FUROSEMIDE  (LASIX ). Dann Candyce RAMAN, MD  Active Self  LIVALO  2 MG TABS [id number]  TAKE 1 TABLET BY MOUTH EVERY DAY  Patient taking differently: Take 1 tablet by mouth every Monday, Wednesday, and Friday.   Jarold Medici, MD  Active Self  metoprolol  succinate (TOPROL -XL) 50 MG 24 hr tablet [id number]  Take 1 tablet (50 mg total) by mouth daily. with food Jarold Medici, MD  Active   Multiple Vitamin [facility] WITH MINERALS) TABS [id number]  Take 1 tablet by mouth daily. Centrum Silver [provider]  Active Self  nystatin  cream (MYCOSTATIN ) [id number]  APPLY TO AFFECTED AREA(S) TWICE DAILY AS NEEDED FOR DRY SKIN UNDER BREAST Jarold Medici, MD  Active   pantoprazole  (PROTONIX ) 20 MG tablet  [id number]  TAKE 1 TABLET BY MOUTH DAILY AS NEEDED FOR MARLINDA Jarold Medici, MD  Active   Semaglutide ,0.25 or 0.5MG /DOS, (OZEMPIC , 0.25 OR 0.5 MG/DOSE,) 2 MG/3ML SOPN [id number]  INJECT 0.25MG  SUBCUTANEOUSLY EVERY TUESDAY. Jarold Medici, MD  Active   tiZANidine  (ZANAFLEX ) 4 MG tablet [id number]  TAKE 1 TABLET (4 MG TOTAL) BY MOUTH DAILY AS NEEDED FOR BACK PAIN Jarold Medici, MD  Active Self  vitamin C (ASCORBIC ACID) 500 MG tablet [id number]  Take 500 mg by mouth daily. [provider]  Active Self            Recommendation:   PCP Follow-up  05/19/2024 Status: Sch   Time: 11:00 AM Length: 40  Visit Type: MEDICARE WELL VISIT [1568] Copay: $0.00  Provider: NADENE MASH VISIT Department: [facility]   Continue Current Plan of Care  Follow Up Plan:   Telephone follow up appointment date/time:    05/13/2024 Status: Sch   Time: 9:30 AM Length: 30  Visit Type: VBCI TELEPHONE CALL 30 [2502] Copay: $0.00  Provider: Morgan Clayborne CROME, RN Department: [facility]   Clayborne Morgan RN BSN CCM [place]  [facility], [facility] Nurse Care Coordinator  Direct Dial: [phone] Website: [email or web address]

## 2024-04-27 NOTE — Patient Instructions (Signed)
 Visit Information  Thank you for taking time to visit with me today. Please don't hesitate to contact me if I can be of assistance to you before our next scheduled appointment.  Our next appointment is by telephone on Thursday, December 11 at 09:30 AM Please call the care guide team at [phone] if you need to cancel or reschedule your appointment.   Following is a copy of your care plan:   Goals Addressed      VBCI RN Care Plan related to high risk for Falls       Problems:  Chronic Disease Management support and education needs related to High Risk for Falls   Goal: Over the next 90 days the Patient will continue to work with RN Care Manager and/or Social Worker to address care management and care coordination needs related to High Risk for Falls  as evidenced by adherence to care management team scheduled appointments      Interventions:   Falls Interventions: Provided written and verbal education re: potential causes of falls and Fall prevention strategies Reviewed medications and discussed potential side effects of medications such as dizziness and frequent urination Advised patient of importance of notifying provider of falls Assessed for signs and symptoms of orthostatic hypotension Provided patient information for fall alert systems  Patient Self-Care Activities:  Attend all scheduled provider appointments Call pharmacy for medication refills 3-7 days in advance of running out of medications Call provider office for new concerns or questions  Take medications as prescribed   Work with the nurse care manager to address care coordination needs and will continue to work with the clinical team to address health care and disease management related needs  Recommendation:   PCP Follow-up  05/19/2024 Status: Sch    Time: 11:00 AM Length: 40  Visit Type: MEDICARE WELL VISIT [1568] Copay: $0.00  Provider: NADENE MASH VISIT Department: [facility]    Continue  Current Plan of Care   Follow Up Plan:   Telephone follow up appointment date/time:    05/13/2024 Status: Sch    Time: 9:30 AM Length: 30  Visit Type: VBCI TELEPHONE CALL 30 [2502] Copay: $0.00  Provider: Morgan Clayborne CROME, RN Department: [facility]       VBCI RN Care Plan related to Nutritional Imbalance       Problems:  Chronic Disease Management support and education needs related to Nutritional Imbalance   Goal: Over the next 90 days the Patient will continue to work with Medical Illustrator and/or Social Worker to address care management and care coordination needs related to Nutritional Imbalance  as evidenced by adherence to care management team scheduled appointments      Interventions:   Evaluation of current treatment plan related to Nutritional Imbalance, self-management and patient's adherence to plan as established by provider Determined patient started experiencing weight loss around the time she started Ozempic  Discussed patient's experienced a change with appetite since starting Ozempic , she reports only eating approximately 1 meal per day  Educated patient re: the pharmacokinetics related to Ozempic  including, that this medication plays a role in the complex process of signaling a feeling of fullness to the brain Discussed this signaling, along with balanced blood sugar levels and a reduction in blood sugar spikes, eases hunger Educated patient as a result, this all leads people using GLP-1 agonists to eat less, and, as an often-welcome side effect of the drug, to lose weight Educated patient re: the potential of losing muscle mass with weight  loss resulting in less stamina and endurance Encouraged patient to implement a routine exercise regimen, including light weight lifting as tolerated to sustain/build muscle mass/strength Reviewed and discussed PCP recommendations to drink Ensure twice daily and patient verbalize adhering to this recommendation  Discussed plans  with patient for ongoing care management follow up and provided patient with direct contact information for care management team  Patient Self-Care Activities:  Attend all scheduled provider appointments Call pharmacy for medication refills 3-7 days in advance of running out of medications Call provider office for new concerns or questions  Take medications as prescribed   Continue to monitor your weight and keep your PCP informed of persistent weight loss Drink at least 2 cans/bottles of Ensure daily as directed Establish a routine exercise regimen, aiming for 30 minutes daily to include light weight lifting as tolerated   Recommendation:   PCP Follow-up  05/19/2024 Status: Sch    Time: 11:00 AM Length: 40  Visit Type: MEDICARE WELL VISIT [1568] Copay: $0.00  Provider: NADENE MASH VISIT Department: [facility]    Continue Current Plan of Care   Follow Up Plan:   Telephone follow up appointment date/time:    05/13/2024 Status: Sch    Time: 9:30 AM Length: 30  Visit Type: VBCI TELEPHONE CALL 30 [2502] Copay: $0.00  Provider: Morgan Clayborne CROME, RN Department: [facility]         Please call the Suicide and Crisis Lifeline: 988 if you are experiencing a Mental Health or Behavioral Health Crisis or need someone to talk to.  Patient verbalized understanding of Care plan and visit instructions communicated this visit  Clayborne Morgan RN BSN CCM [facility]  [facility], [facility] Nurse Care Coordinator  Direct Dial: [phone] Website: [email or web address]

## 2024-05-07 ENCOUNTER — Other Ambulatory Visit: Payer: Self-pay | Admitting: Internal Medicine

## 2024-05-13 ENCOUNTER — Other Ambulatory Visit: Payer: Self-pay

## 2024-05-13 NOTE — Patient Instructions (Signed)
 Visit Information  Thank you for taking time to visit with me today. Please don't hesitate to contact me if I can be of assistance to you before our next scheduled appointment.  Your next care management appointment is by telephone on Wednesday, January 7 at 09:30 AM  Please call the care guide team at [phone] if you need to cancel, schedule, or reschedule an appointment.   Please call 1-800-273-TALK (toll free, 24 hour hotline) if you are experiencing a Mental Health or Behavioral Health Crisis or need someone to talk to.   Clayborne Ly RN BSN CCM [place]  [facility], [facility] Health Nurse Care Coordinator  Direct Dial: [phone] Website: [email or web address]

## 2024-05-13 NOTE — Patient Outreach (Signed)
 Complex Care Management   Visit Note  05/13/2024  Name:  [name] MRN: [id number] DOB: [birth year]  Situation: Referral received for Complex Care Management related to  Hypertensive heart and renal disease with renal failure, stage 1 through stage 4 or unspecified chronic disease, with heart failure, Paroxysmal Atrial fibrillation, type 2 diabetes mellitus with stage 3b chronic kidney disease, with long-term current use of insulin , Diabetic peripheral vascular disease. I obtained verbal consent from Patient.  Visit completed with Patient on the phone.  Background:   Past Medical History:  Diagnosis Date   Arthritis    left knee (03/12/2013)   Atrial fibrillation (HCC)    Cardioverted to NSR 08/22/2009   Atrial tachycardia 09/30/2019   Cardiomyopathy    CHF (congestive heart failure) (HCC)    Chronic combined systolic and diastolic heart failure (HCC) 04/09/2021   CKD (chronic kidney disease)    Depression    Dizziness    DM2 (diabetes mellitus, type 2) (HCC)    fasting 80-100 (03/12/2013)   Drug therapy 1/11   coumadin    Ejection fraction < 50%    20-25%, echo.06/2009, /  EF 30-35%, diffuse hypokinesis, echo, Oct 16, 2010   GERD (gastroesophageal reflux disease)    Gout    left knee (03/12/2013)   History of bronchitis    History of emphysema (HCC)    HLD (hyperlipidemia)    Hyperkalemia    Hypertension    LFT elevation    06/2009   Mitral regurgitation    mild - echo - 1/11 /  Mild, echo, May15, 2012   Overweight(278.02)    Pneumonia    @ least twice (03/12/2013)   Thyroid  dysfunction    thyromegally,diffuse,nodule LLL,06/2009   Tobacco abuse    Tricuspid regurgitation    moderate - echo - 1/11    Assessment: Patient Reported Symptoms:  Cognitive Cognitive Status: Alert and oriented to person, place, and time, Normal speech and language skills Cognitive/Intellectual Conditions Management [RPT]: None reported or documented in medical history or problem  list   Health Maintenance Behaviors: Annual physical exam, Healthy diet Health Facilitated by: Healthy diet, Rest  Neurological Neurological Review of Symptoms: No symptoms reported    HEENT HEENT Symptoms Reported: No symptoms reported      Cardiovascular Cardiovascular Symptoms Reported: Swelling in legs or feet Does patient have uncontrolled Hypertension?: No Cardiovascular Management Strategies: Adequate rest, Routine screening, Medication therapy, Exercise Weight: 150 lb 6.4 oz (68.2 kg) Cardiovascular Self-Management Outcome: 3 (uncertain) Cardiovascular Comment: c/o new onset of edema to left foot  Respiratory Respiratory Symptoms Reported: No symptoms reported    Endocrine Endocrine Symptoms Reported: No symptoms reported Is patient diabetic?: Yes Is patient checking blood sugars at home?: Yes Endocrine Self-Management Outcome: 4 (good)  Gastrointestinal Gastrointestinal Symptoms Reported: Reflux/heartburn Gastrointestinal Management Strategies: Medication therapy Gastrointestinal Self-Management Outcome: 4 (good)    Genitourinary Genitourinary Symptoms Reported: Frequency Additional Genitourinary Details: only when taking fluid pill Genitourinary Management Strategies: Fluid modification Genitourinary Self-Management Outcome: 4 (good)  Integumentary Integumentary Symptoms Reported: Skin changes Additional Integumentary Details: dryness, intermittent under breast folds Skin Management Strategies: Routine screening, Medication therapy Skin Self-Management Outcome: 4 (good)  Musculoskeletal Musculoskelatal Symptoms Reviewed: Limited mobility, Unsteady gait, Difficulty walking Additional Musculoskeletal Details: s/p knee replacement; swelling and pain to left foot Musculoskeletal Management Strategies: Adequate rest, Medication therapy, Routine screening, Exercise (performing ankle pumps)   Patient at Risk for Falls Due to: History of fall(s), Impaired balance/gait, Impaired  mobility Fall risk Follow up: Education  provided, Falls evaluation completed  Psychosocial Psychosocial Symptoms Reported: Alteration in eating habits   Major Change/Loss/Stressor/Fears (CP): Denies Quality of Family Relationships: supportive Do you feel physically threatened by others?: No    05/13/2024    PHQ2-9 Depression Screening   [name] interest or pleasure in doing things    Feeling down, depressed, or hopeless    PHQ-2 - Total Score    Trouble falling or staying asleep, or sleeping too much    Feeling tired or having [name] energy    Poor appetite or overeating     Feeling bad about yourself - or that you are a failure or have let yourself or your family down    Trouble concentrating on things, such as reading the newspaper or watching television    Moving or speaking so slowly that other people could have noticed.  Or the opposite - being so fidgety or restless that you have been moving around a lot more than usual    Thoughts that you would be better off dead, or hurting yourself in some way    PHQ2-9 Total Score    If you checked off any problems, how difficult have these problems made it for you to do your work, take care of things at home, or get along with other people    Depression Interventions/Treatment      Today's Vitals   05/13/24 0952  Weight: 150 lb 6.4 oz (68.2 kg)   Pain Scale: 0-10 Pain Score: 4  Pain Type: Acute pain Pain Location: Foot Pain Orientation: Left Pain Descriptors / Indicators: Aching, Discomfort Pain Onset: Sudden Pain Intervention(s): Medication (See eMAR), Rest, Repositioned, Other (Comment) (performing ankle pumps) Multiple Pain Sites: No  Medications Reviewed Today     Reviewed by Morgan Clayborne CROME, RN (Registered Nurse) on 05/13/24 at [phone]  Med List Status: <None>   Medication Order Taking? Sig Documenting Provider Last Dose Status Informant  acetaminophen  (TYLENOL ) 500 MG tablet [id number] Yes Take 1,000 mg by mouth 3 (three)  times daily as needed for pain. [provider]  Active Self  apixaban  (ELIQUIS ) 5 MG TABS tablet [id number] Yes Take 1 tablet (5 mg total) by mouth 2 (two) times daily. Jarold Medici, MD  Active   betamethasone  valerate ointment (VALISONE ) 0.1 % [id number] Yes Apply 1 Application topically 2 (two) times daily. prn Jarold Medici, MD  Active Self  Cholecalciferol (VITAMIN D) 2000 UNITS CAPS [id number] Yes Take 2,000 Units by mouth daily. [provider]  Active Self  diclofenac  Sodium (VOLTAREN ) 1 % GEL [id number] Yes Apply 4 g topically 4 (four) times daily. Emil Share, DO  Active Self  digoxin  (LANOXIN ) 0.125 MG tablet [id number] Yes Take 1 tablet (125 mcg total) by mouth daily. Please keep your upcoming appointment for refills. Jarold Medici, MD  Active   enalapril  (VASOTEC ) 5 MG tablet [id number] Yes TAKE 1 TABLET BY MOUTH DAILY AT BEDTIME *PATIENT NEEDS APPOINTMENTDEWAINE Jarold Medici, MD  Active   ezetimibe  (ZETIA ) 10 MG tablet [id number] Yes Take 1 tablet (10 mg total) by mouth daily. Jarold Medici, MD  Active   furosemide  (LASIX ) 40 MG tablet [id number] Yes TAKE 1 TABLET BY MOUTH AS NEEDED FOR EDEMA OR FLUID Jarold Medici, MD  Active   glucose blood [facility] VERIO) test strip [id number]  USE TO TEST BLOOD SUGAR ONCE DAILY AS DIRECTED Jarold Medici, MD  Active Self  Insulin  Pen Needle (BD PEN NEEDLE NANO U/F) 32G X 4 MM MISC [id number]  USE ONCE DAILY AS NEEDED.  Jarold Medici, MD  Active   KLOR-CON  M20 20 MEQ tablet [id number]  TAKE 1 TABLET (20 MEQ TOTAL) BY MOUTH DAILY. WHEN TAKING FUROSEMIDE  (LASIX ).  Patient not taking: Reported on 05/13/2024   Dann Candyce RAMAN, MD  Active Self  LIVALO  2 MG TABS [id number] Yes TAKE 1 TABLET BY MOUTH EVERY DAY  Patient taking differently: Take 1 tablet by mouth every Monday, Wednesday, and Friday.   Jarold Medici, MD  Active Self  metoprolol  succinate (TOPROL -XL) 50 MG 24 hr tablet [id number] Yes Take 1 tablet (50 mg total) by mouth daily.  with food Jarold Medici, MD  Active   Multiple Vitamin [facility] WITH MINERALS) TABS [id number] Yes Take 1 tablet by mouth daily. Centrum Silver [provider]  Active Self  nystatin  cream (MYCOSTATIN ) [id number] Yes APPLY TO AFFECTED AREA(S) TWICE DAILY AS NEEDED FOR DRY SKIN UNDER BREAST Jarold Medici, MD  Active   pantoprazole  (PROTONIX ) 20 MG tablet [id number] Yes TAKE 1 TABLET BY MOUTH DAILY AS NEEDED FOR HEARTBURN  Patient taking differently: TAKE 1 TABLET BY MOUTH DAILY AS NEEDED FOR HEARTBURN. EXCEPT ON THE WEEKENDS   Jarold Medici, MD  Active   Semaglutide ,0.25 or 0.5MG /DOS, (OZEMPIC , 0.25 OR 0.5 MG/DOSE,) 2 MG/3ML SOPN [id number] Yes INJECT 0.25MG  SUBCUTANEOUSLY EVERY TUESDAY. Jarold Medici, MD  Active   tiZANidine  (ZANAFLEX ) 4 MG tablet [id number] Yes TAKE 1 TABLET (4 MG TOTAL) BY MOUTH DAILY AS NEEDED FOR BACK PAIN Jarold Medici, MD  Active Self  vitamin C (ASCORBIC ACID) 500 MG tablet [id number] Yes Take 500 mg by mouth daily. [provider]  Active Self            Recommendation:   PCP Follow-up 05/19/2024 Status: Sch   Time: 11:00 AM Length: 40  Visit Type: MEDICARE WELL VISIT [1568] Copay: $0.00  Provider: NADENE MASH VISIT Department: [facility]   Follow Up Plan:   Telephone follow up appointment date/time    06/09/2024 Status: Sch   Time: 9:30 AM Length: 30  Visit Type: VBCI TELEPHONE CALL 30 [2502] Copay: $0.00  Provider: Morgan Clayborne CROME, RN Department: [facility]   Clayborne Morgan RN BSN CCM [place]  [facility], [facility] Nurse Care Coordinator  Direct Dial: [phone] Website: [email or web address]

## 2024-05-19 ENCOUNTER — Ambulatory Visit: Payer: Self-pay

## 2024-05-19 ENCOUNTER — Ambulatory Visit: Payer: Medicare Other

## 2024-05-19 VITALS — BP 118/60 | HR 60 | Temp 97.8°F | Ht 67.0 in | Wt 154.2 lb

## 2024-05-19 DIAGNOSIS — Z0000 Encounter for general adult medical examination without abnormal findings: Secondary | ICD-10-CM

## 2024-05-19 NOTE — Patient Instructions (Addendum)
 [name],  Thank you for taking the time for your Medicare Wellness Visit. I appreciate your continued commitment to your health goals. Please review the care plan we discussed, and feel free to reach out if I can assist you further.  Please note that Annual Wellness Visits do not include a physical exam. Some assessments may be limited, especially if the visit was conducted virtually. If needed, we may recommend an in-person follow-up with your provider.  Ongoing Care Seeing your primary care provider every 3 to 6 months helps us  monitor your health and provide consistent, personalized care.   Referrals If a referral was made during today's visit and you haven't received any updates within two weeks, please contact the referred provider directly to check on the status.  Recommended Screenings:  Health Maintenance  Topic Date Due   Screening for Lung Cancer  06/29/2022   Eye exam for diabetics  03/21/2023   Colon Cancer Screening  09/15/2023   COVID-19 Vaccine (8 - 2025-26 season) 02/02/2024   Hemoglobin A1C  10/06/2024   Yearly kidney function blood test for diabetes  04/08/2025   Yearly kidney health urinalysis for diabetes  04/08/2025   Complete foot exam   04/08/2025   Medicare Annual Wellness Visit  05/19/2025   DTaP/Tdap/Td vaccine (2 - Td or Tdap) 07/29/2032   Pneumococcal Vaccine for age over 70  Completed   Flu Shot  Completed   Osteoporosis screening with Bone Density Scan  Completed   Hepatitis C Screening  Completed   Zoster (Shingles) Vaccine  Completed   Meningitis B Vaccine  Aged Out   Breast Cancer Screening  Discontinued       05/19/2024   11:12 AM  Advanced Directives  Does Patient Have a Medical Advance Directive? Yes  Type of Advance Directive Healthcare Power of Attorney  Copy of Healthcare Power of Attorney in Chart? No - copy requested    Vision: Annual vision screenings are recommended for early detection of glaucoma, cataracts, and diabetic  retinopathy. These exams can also reveal signs of chronic conditions such as diabetes and high blood pressure.  Dental: Annual dental screenings help detect early signs of oral cancer, gum disease, and other conditions linked to overall health, including heart disease and diabetes.  Please see the attached documents for additional preventive care recommendations.

## 2024-05-19 NOTE — Progress Notes (Signed)
 Chief Complaint  Patient presents with   Medicare Wellness     Subjective:   [name] is a 78 y.o. female who presents for a Medicare Annual Wellness Visit.  Visit info / Clinical Intake: Medicare Wellness Visit Type:: Subsequent Annual Wellness Visit Persons participating in visit and providing information:: patient Medicare Wellness Visit Mode:: In-person (required for WTM) Interpreter Needed?: No Pre-visit prep was completed: yes AWV questionnaire completed by patient prior to visit?: no Living arrangements:: (!) lives alone Patient's Overall Health Status Rating: good Typical amount of pain: some Does pain affect daily life?: (!) yes (sometimes) Are you currently prescribed opioids?: no  Dietary Habits and Nutritional Risks How many meals a day?: 2 Eats fruit and vegetables daily?: (!) no Most meals are obtained by: preparing own meals In the last 2 weeks, have you had any of the following?: none Diabetic:: (!) yes Any non-healing wounds?: no How often do you check your BS?: 1 Would you like to be referred to a Nutritionist or for Diabetic Management? : no  Functional Status Activities of Daily Living (to include ambulation/medication): Independent Ambulation: Independent with device- listed below Home Assistive Devices/Equipment: Cane Medication Administration: Independent Home Management (perform basic housework or laundry): Independent Manage your own finances?: yes Primary transportation is: driving Concerns about vision?: no *vision screening is required for WTM* Concerns about hearing?: no  Fall Screening Falls in the past year?: 1 (blood sugar low) Number of falls in past year: 0 Was there an injury with Fall?: 0 Fall Risk Category Calculator: 1 Patient Fall Risk Level: Low Fall Risk  Fall Risk Patient at Risk for Falls Due to: Medication side effect; Impaired balance/gait; Impaired mobility Fall risk Follow up: Falls prevention discussed;  Falls evaluation completed  Home and Transportation Safety: All rugs have non-skid backing?: N/A, no rugs All stairs or steps have railings?: (!) no Grab bars in the bathtub or shower?: yes Have non-skid surface in bathtub or shower?: yes Good home lighting?: yes Regular seat belt use?: yes Hospital stays in the last year:: no  Cognitive Assessment Difficulty concentrating, remembering, or making decisions? : no Will 6CIT or Mini Cog be Completed: yes What year is it?: 0 points What month is it?: 0 points Give patient an address phrase to remember (5 components): [address] About what time is it?: 0 points Count backwards from 20 to 1: 0 points Say the months of the year in reverse: 0 points Repeat the address phrase from earlier: 0 points 6 CIT Score: 0 points  Advance Directives (For Healthcare) Does Patient Have a Medical Advance Directive?: Yes Type of Advance Directive: Healthcare Power of Attorney Copy of Healthcare Power of Attorney in Chart?: No - copy requested  Reviewed/Updated  Reviewed/Updated: Reviewed All (Medical, Surgical, Family, Medications, Allergies, Care Teams, Patient Goals)    Allergies (verified) Codeine, Augmentin  [amoxicillin -pot clavulanate], Coreg  [carvedilol ], and Warfarin and related   Current Medications (verified) Outpatient Encounter Medications as of 05/19/2024  Medication Sig   acetaminophen  (TYLENOL ) 500 MG tablet Take 1,000 mg by mouth 3 (three) times daily as needed for pain.   apixaban  (ELIQUIS ) 5 MG TABS tablet Take 1 tablet (5 mg total) by mouth 2 (two) times daily.   betamethasone  valerate ointment (VALISONE ) 0.1 % Apply 1 Application topically 2 (two) times daily. prn   Cholecalciferol (VITAMIN D) 2000 UNITS CAPS Take 2,000 Units by mouth daily.   diclofenac  Sodium (VOLTAREN ) 1 % GEL Apply 4 g topically 4 (four)  times daily.   digoxin  (LANOXIN ) 0.125 MG tablet Take 1 tablet (125 mcg total) by mouth daily. Please keep  your upcoming appointment for refills.   enalapril  (VASOTEC ) 5 MG tablet TAKE 1 TABLET BY MOUTH DAILY AT BEDTIME *PATIENT NEEDS APPOINTMENT*   ezetimibe  (ZETIA ) 10 MG tablet Take 1 tablet (10 mg total) by mouth daily.   furosemide  (LASIX ) 40 MG tablet TAKE 1 TABLET BY MOUTH AS NEEDED FOR EDEMA OR FLUID   glucose blood (ONETOUCH VERIO) test strip USE TO TEST BLOOD SUGAR ONCE DAILY AS DIRECTED   Insulin  Pen Needle (BD PEN NEEDLE NANO U/F) 32G X 4 MM MISC USE ONCE DAILY AS NEEDED.   LIVALO  2 MG TABS TAKE 1 TABLET BY MOUTH EVERY DAY (Patient taking differently: Take 1 tablet by mouth every Monday, Wednesday, and Friday.)   metoprolol  succinate (TOPROL -XL) 50 MG 24 hr tablet Take 1 tablet (50 mg total) by mouth daily. with food   Multiple Vitamin (MULITIVITAMIN WITH MINERALS) TABS Take 1 tablet by mouth daily. Centrum Silver   nystatin  cream (MYCOSTATIN ) APPLY TO AFFECTED AREA(S) TWICE DAILY AS NEEDED FOR DRY SKIN UNDER BREAST   pantoprazole  (PROTONIX ) 20 MG tablet TAKE 1 TABLET BY MOUTH DAILY AS NEEDED FOR HEARTBURN (Patient taking differently: TAKE 1 TABLET BY MOUTH DAILY AS NEEDED FOR HEARTBURN. EXCEPT ON THE WEEKENDS)   Semaglutide ,0.25 or 0.5MG /DOS, (OZEMPIC , 0.25 OR 0.5 MG/DOSE,) 2 MG/3ML SOPN INJECT 0.25MG  SUBCUTANEOUSLY EVERY TUESDAY.   tiZANidine  (ZANAFLEX ) 4 MG tablet TAKE 1 TABLET (4 MG TOTAL) BY MOUTH DAILY AS NEEDED FOR BACK PAIN   vitamin C (ASCORBIC ACID) 500 MG tablet Take 500 mg by mouth daily.   KLOR-CON  M20 20 MEQ tablet TAKE 1 TABLET (20 MEQ TOTAL) BY MOUTH DAILY. WHEN TAKING FUROSEMIDE  (LASIX ). (Patient not taking: Reported on 05/19/2024)   No facility-administered encounter medications on file as of 05/19/2024.    History: Past Medical History:  Diagnosis Date   Arthritis    left knee (03/12/2013)   Atrial fibrillation (HCC)    Cardioverted to NSR 08/22/2009   Atrial tachycardia 09/30/2019   Cardiomyopathy    CHF (congestive heart failure) (HCC)    Chronic combined  systolic and diastolic heart failure (HCC) 04/09/2021   CKD (chronic kidney disease)    Depression    Dizziness    DM2 (diabetes mellitus, type 2) (HCC)    fasting 80-100 (03/12/2013)   Drug therapy 1/11   coumadin    Ejection fraction < 50%    20-25%, echo.06/2009, /  EF 30-35%, diffuse hypokinesis, echo, Oct 16, 2010   GERD (gastroesophageal reflux disease)    Gout    left knee (03/12/2013)   History of bronchitis    History of emphysema (HCC)    HLD (hyperlipidemia)    Hyperkalemia    Hypertension    LFT elevation    06/2009   Mitral regurgitation    mild - echo - 1/11 /  Mild, echo, May15, 2012   Overweight(278.02)    Pneumonia    @ least twice (03/12/2013)   Thyroid  dysfunction    thyromegally,diffuse,nodule LLL,06/2009   Tobacco abuse    Tricuspid regurgitation    moderate - echo - 1/11   Past Surgical History:  Procedure Laterality Date   ATRIAL FIBRILLATION ABLATION N/A 05/29/2023   Procedure: ATRIAL FIBRILLATION ABLATION;  Surgeon: Inocencio Soyla Lunger, MD;  Location: [facility];  Service: Cardiovascular;  Laterality: N/A;   BIOPSY  09/29/2020   Procedure: BIOPSY;  Surgeon: Rollin Dover,  MD;  Location: [facility] ENDOSCOPY;  Service: Endoscopy;;   BREAST BIOPSY Right 1980's   BREAST LUMPECTOMY Right 1980's   CARDIOVERSION N/A 10/15/2019   Procedure: CARDIOVERSION;  Surgeon: Raford Riggs, MD;  Location: [facility] ENDOSCOPY;  Service: Cardiovascular;  Laterality: N/A;   CATARACT EXTRACTION W/ INTRAOCULAR LENS  IMPLANT, BILATERAL Bilateral 2010-2012   CHOLECYSTECTOMY  ~ 1978   ESOPHAGOGASTRODUODENOSCOPY (EGD) WITH PROPOFOL  N/A 09/29/2020   Procedure: ESOPHAGOGASTRODUODENOSCOPY (EGD) WITH PROPOFOL ;  Surgeon: Rollin Dover, MD;  Location: [facility]OS[facility];  Service: Endoscopy;  Laterality: N/A;   JOINT REPLACEMENT     TONSILLECTOMY  1960's   TOTAL KNEE ARTHROPLASTY Left 03/12/2013   TOTAL KNEE ARTHROPLASTY Left 03/12/2013   Procedure: TOTAL KNEE ARTHROPLASTY;  Surgeon:  Jerona LULLA Sage, MD;  Location: [facility] OR;  Service: Orthopedics;  Laterality: Left;  Left total knee arthroplasty   TUBAL LIGATION     UPPER ESOPHAGEAL ENDOSCOPIC ULTRASOUND (EUS) N/A 09/29/2020   Procedure: UPPER ESOPHAGEAL ENDOSCOPIC ULTRASOUND (EUS);  Surgeon: Rollin Dover, MD;  Location: [facility];  Service: Endoscopy;  Laterality: N/A;   VAGINAL HYSTERECTOMY  1987   Family History  Problem Relation Age of Onset   Stroke Mother    Prostate cancer Father    Hypertension Sister    Stroke Maternal Grandfather    Stroke Paternal Grandmother    Heart attack Son    Hypertension Son        X2   Colon cancer Other    Heart disease Other    Breast cancer Neg Hx    Social History   Occupational History   Occupation: retired  Tobacco Use   Smoking status: Every Day    Current packs/day: 0.50    Average packs/day: 0.5 packs/day for 40.0 years (20.0 ttl pk-yrs)    Types: Cigarettes   Smokeless tobacco: Never   Tobacco comments:    smoked 1.5ppd x 9, 1ppdx40, cut back to 1/2ppd past 5 years  Vaping Use   Vaping status: Never Used  Substance and Sexual Activity   Alcohol use: No   Drug use: No   Sexual activity: Not Currently   Tobacco Counseling Ready to quit: Not Answered Counseling given: Not Answered Tobacco comments: smoked 1.5ppd x 9, 1ppdx40, cut back to 1/2ppd past 5 years  SDOH Screenings   Food Insecurity: No Food Insecurity (05/19/2024)  Housing: Unknown (05/19/2024)  Transportation Needs: No Transportation Needs (05/19/2024)  Utilities: Not At Risk (05/19/2024)  Alcohol Screen: Low Risk (05/19/2024)  Depression (PHQ2-9): Low Risk (05/19/2024)  Financial Resource Strain: Low Risk (05/19/2024)  Physical Activity: Inactive (05/19/2024)  Social Connections: Socially Isolated (05/19/2024)  Stress: No Stress Concern Present (05/19/2024)  Tobacco Use: High Risk (05/19/2024)  Health Literacy: Adequate Health Literacy (05/19/2024)   See flowsheets for full screening  details  Depression Screen PHQ 2 & 9 Depression Scale- Over the past 2 weeks, how often have you been bothered by any of the following problems? Little interest or pleasure in doing things: 0 Feeling down, depressed, or hopeless (PHQ Adolescent also includes...irritable): 0 PHQ-2 Total Score: 0 Trouble falling or staying asleep, or sleeping too much: 2 (dozes off during the day) Feeling tired or having little energy: 0 Poor appetite or overeating (PHQ Adolescent also includes...weight loss): 1 Feeling bad about yourself - or that you are a failure or have let yourself or your family down: 0 Trouble concentrating on things, such as reading the newspaper or watching television (PHQ Adolescent also includes...like school work): 0 Moving or speaking so  slowly that other people could have noticed. Or the opposite - being so fidgety or restless that you have been moving around a lot more than usual: 0 Thoughts that you would be better off dead, or of hurting yourself in some way: 0 PHQ-9 Total Score: 3 If you checked off any problems, how difficult have these problems made it for you to do your work, take care of things at home, or get along with other people?: Somewhat difficult     Goals Addressed             This Visit's Progress    Patient Stated       05/19/2024, stay alive             Objective:    Today's Vitals   05/19/24 1101  BP: 118/60  Pulse: 60  Temp: 97.8 F (36.6 C)  TempSrc: Oral  Weight: 154 lb 3.2 oz (69.9 kg)  Height: 5' 7 (1.702 m)   Body mass index is 24.15 kg/m.  Hearing/Vision screen Hearing Screening - Comments:: Denies hearing issues Vision Screening - Comments:: Due for eye exam, Dr. Cyrus Immunizations and Health Maintenance Health Maintenance  Topic Date Due   Lung Cancer Screening  06/29/2022   OPHTHALMOLOGY EXAM  03/21/2023   Colonoscopy  09/15/2023   COVID-19 Vaccine (8 - 2025-26 season) 02/02/2024   HEMOGLOBIN A1C  10/06/2024    Diabetic kidney evaluation - eGFR measurement  04/08/2025   Diabetic kidney evaluation - Urine ACR  04/08/2025   FOOT EXAM  04/08/2025   Medicare Annual Wellness (AWV)  05/19/2025   DTaP/Tdap/Td (2 - Td or Tdap) 07/29/2032   Pneumococcal Vaccine: 50+ Years  Completed   Influenza Vaccine  Completed   Bone Density Scan  Completed   Hepatitis C Screening  Completed   Zoster Vaccines- Shingrix  Completed   Meningococcal B Vaccine  Aged Out   Mammogram  Discontinued        Assessment/Plan:  This is a routine wellness examination for [facility].  Patient Care Team: Jarold Medici, MD as PCP - General (Internal Medicine) Raford Riggs, MD as PCP - Cardiology (Cardiology) Inocencio Soyla Lunger, MD as PCP - Electrophysiology (Cardiology) Rollin Dover, MD as Consulting Physician (Gastroenterology) Cyrus Carwin, MD as Consulting Physician (Ophthalmology) Morgan, Clayborne CROME, RN as [facility], [facility]  I have personally reviewed and noted the following in the patients chart:   Medical and social history Use of alcohol, tobacco or illicit drugs  Current medications and supplements including opioid prescriptions. Functional ability and status Nutritional status Physical activity Advanced directives List of other physicians Hospitalizations, surgeries, and ER visits in previous 12 months Vitals Screenings to include cognitive, depression, and falls Referrals and appointments  No orders of the defined types were placed in this encounter.  In addition, I have reviewed and discussed with patient certain preventive protocols, quality metrics, and best practice recommendations. A written personalized care plan for preventive services as well as general preventive health recommendations were provided to patient.   Ardella FORBES Dawn, LPN   87/82/7974   Return in 1 year (on 05/19/2025).  After Visit Summary: (In Person-Printed) AVS printed and given to the  patient  Nurse Notes: HM Addressed: Vaccines Due: covid. Eye exam, DEXA and colonoscopy are in the works of getting scheduled States they have called about lung screening.

## 2024-06-09 ENCOUNTER — Other Ambulatory Visit: Payer: Self-pay

## 2024-06-09 NOTE — Patient Instructions (Signed)
 Visit Information  Thank you for taking time to visit with me today.    Please call [phone]-TALK (toll free, 24 hour hotline) if you are experiencing a Mental Health or Behavioral Health Crisis or need someone to talk to.  Clayborne Ly RN BSN CCM [place]  [facility], [facility] Nurse Care Coordinator  Direct Dial: [phone] Website: [email or web address]

## 2024-06-09 NOTE — Patient Outreach (Signed)
 Complex Care Management   Visit Note  06/09/2024  Name:  [name] MRN: [id number] DOB: 10-Sep-1945  Situation: Referral received for Complex Care Management related to Hypertensive heart and renal disease with renal failure, stage 1 through stage 4 or unspecified chronic disease, with heart failure, Paroxysmal Atrial fibrillation, type 2 diabetes mellitus with stage 3b chronic kidney disease, with long-term current use of insulin , Diabetic peripheral vascular disease. I obtained verbal consent from Patient.  Visit completed with Patient on the phone.  Background:   Past Medical History:  Diagnosis Date   Arthritis    left knee (03/12/2013)   Atrial fibrillation (HCC)    Cardioverted to NSR 08/22/2009   Atrial tachycardia 09/30/2019   Cardiomyopathy    CHF (congestive heart failure) (HCC)    Chronic combined systolic and diastolic heart failure (HCC) 04/09/2021   CKD (chronic kidney disease)    Depression    Dizziness    DM2 (diabetes mellitus, type 2) (HCC)    fasting 80-100 (03/12/2013)   Drug therapy 1/11   coumadin    Ejection fraction < 50%    20-25%, echo.06/2009, /  EF 30-35%, diffuse hypokinesis, echo, Oct 16, 2010   GERD (gastroesophageal reflux disease)    Gout    left knee (03/12/2013)   History of bronchitis    History of emphysema (HCC)    HLD (hyperlipidemia)    Hyperkalemia    Hypertension    LFT elevation    06/2009   Mitral regurgitation    mild - echo - 1/11 /  Mild, echo, May15, 2012   Overweight(278.02)    Pneumonia    @ least twice (03/12/2013)   Thyroid  dysfunction    thyromegally,diffuse,nodule LLL,06/2009   Tobacco abuse    Tricuspid regurgitation    moderate - echo - 1/11    Assessment: Patient Reported Symptoms:  Cognitive Cognitive Status: Able to follow simple commands, Normal speech and language skills Cognitive/Intellectual Conditions Management [RPT]: None reported or documented in medical history or problem list   Health  Maintenance Behaviors: Annual physical exam, Exercise, Healthy diet Health Facilitated by: Rest, Healthy diet  Neurological Neurological Review of Symptoms: No symptoms reported    HEENT HEENT Symptoms Reported: No symptoms reported      Cardiovascular Cardiovascular Symptoms Reported: Swelling in legs or feet Does patient have uncontrolled Hypertension?: No Cardiovascular Management Strategies: Medication therapy, Routine screening, Diet modification Cardiovascular Self-Management Outcome: 4 (good)  Respiratory Respiratory Symptoms Reported: No symptoms reported    Endocrine Endocrine Symptoms Reported: Hypoglycemia, Shakiness Is patient diabetic?: Yes Is patient checking blood sugars at home?: Yes List most recent blood sugar readings, include date and time of day: patient has an occassional hypoglycemic episode with sugars dropping down in the 70's Endocrine Self-Management Outcome: 4 (good)  Gastrointestinal Gastrointestinal Symptoms Reported: Change in appetite Gastrointestinal Management Strategies: Diet modification Gastrointestinal Self-Management Outcome: 4 (good)    Genitourinary Genitourinary Symptoms Reported: No symptoms reported    Integumentary Integumentary Symptoms Reported: No symptoms reported    Musculoskeletal Musculoskelatal Symptoms Reviewed: Limited mobility, Difficulty walking, Unsteady gait, Weakness Additional Musculoskeletal Details: related to age and deconditioning Musculoskeletal Management Strategies: Adequate rest, Medical device, Routine screening, Exercise Musculoskeletal Self-Management Outcome: 4 (good) Falls in the past year?: Yes Number of falls in past year: 1 or less Was there an injury with Fall?: No Fall Risk Category Calculator: 1 Patient Fall Risk Level: Low Fall Risk Patient at Risk for Falls Due to: Impaired mobility, Impaired balance/gait, Medication side effect Fall  risk Follow up: Education provided, Falls evaluation completed,  Falls prevention discussed  Psychosocial Psychosocial Symptoms Reported: Alteration in eating habits Behavioral Management Strategies: Coping strategies (diet modification; medication dose adjustment with Ozempic ) Behavioral Health Self-Management Outcome: 4 (good) Major Change/Loss/Stressor/Fears (CP): Denies Quality of Family Relationships: helpful, involved, supportive Do you feel physically threatened by others?: No    06/09/2024    PHQ2-9 Depression Screening   [name] interest or pleasure in doing things    Feeling down, depressed, or hopeless    PHQ-2 - Total Score    Trouble falling or staying asleep, or sleeping too much    Feeling tired or having [name] energy    Poor appetite or overeating     Feeling bad about yourself - or that you are a failure or have let yourself or your family down    Trouble concentrating on things, such as reading the newspaper or watching television    Moving or speaking so slowly that other people could have noticed.  Or the opposite - being so fidgety or restless that you have been moving around a lot more than usual    Thoughts that you would be better off dead, or hurting yourself in some way    PHQ2-9 Total Score    If you checked off any problems, how difficult have these problems made it for you to do your work, take care of things at home, or get along with other people    Depression Interventions/Treatment      There were no vitals filed for this visit. Pain Scale: Not given for pain  Medications Reviewed Today     Reviewed by Morgan Clayborne CROME, RN (Registered Nurse) on 06/09/24 at [phone]  Med List Status: <None>   Medication Order Taking? Sig Documenting Provider Last Dose Status Informant  acetaminophen  (TYLENOL ) 500 MG tablet [id number]  Take 1,000 mg by mouth 3 (three) times daily as needed for pain. [provider]  Active Self  apixaban  (ELIQUIS ) 5 MG TABS tablet [id number]  Take 1 tablet (5 mg total) by mouth 2 (two) times daily.  Jarold Medici, MD  Active   betamethasone  valerate ointment (VALISONE ) 0.1 % [id number]  Apply 1 Application topically 2 (two) times daily. prn Jarold Medici, MD  Active Self  Cholecalciferol (VITAMIN D) 2000 UNITS CAPS [id number]  Take 2,000 Units by mouth daily. [provider]  Active Self  diclofenac  Sodium (VOLTAREN ) 1 % GEL [id number]  Apply 4 g topically 4 (four) times daily. Emil Share, DO  Active Self  digoxin  (LANOXIN ) 0.125 MG tablet [id number]  Take 1 tablet (125 mcg total) by mouth daily. Please keep your upcoming appointment for refills. Jarold Medici, MD  Active   enalapril  (VASOTEC ) 5 MG tablet [id number]  TAKE 1 TABLET BY MOUTH DAILY AT BEDTIME *PATIENT NEEDS APPOINTMENTDEWAINE Jarold Medici, MD  Active   ezetimibe  (ZETIA ) 10 MG tablet [id number]  Take 1 tablet (10 mg total) by mouth daily. Jarold Medici, MD  Active   furosemide  (LASIX ) 40 MG tablet [id number]  TAKE 1 TABLET BY MOUTH AS NEEDED FOR EDEMA OR FLUID Jarold Medici, MD  Active   glucose blood [facility] VERIO) test strip [id number]  USE TO TEST BLOOD SUGAR ONCE DAILY AS DIRECTED Jarold Medici, MD  Active Self  Insulin  Pen Needle (BD PEN NEEDLE NANO U/F) 32G X 4 MM MISC [id number]  USE ONCE DAILY AS NEEDED. Jarold Medici, MD  Active   KLOR-CON  M20 20 MEQ tablet [id number]  TAKE 1 TABLET (20  MEQ TOTAL) BY MOUTH DAILY. WHEN TAKING FUROSEMIDE  (LASIX ).  Patient not taking: Reported on 05/19/2024   Dann Candyce RAMAN, MD  Active Self  LIVALO  2 MG TABS [id number]  TAKE 1 TABLET BY MOUTH EVERY DAY  Patient taking differently: Take 1 tablet by mouth every Monday, Wednesday, and Friday.   Jarold Medici, MD  Active Self  metoprolol  succinate (TOPROL -XL) 50 MG 24 hr tablet [id number]  Take 1 tablet (50 mg total) by mouth daily. with food Jarold Medici, MD  Active   Multiple Vitamin [facility] WITH MINERALS) TABS [id number]  Take 1 tablet by mouth daily. Centrum Silver [provider]  Active Self  nystatin  cream  (MYCOSTATIN ) [id number]  APPLY TO AFFECTED AREA(S) TWICE DAILY AS NEEDED FOR DRY SKIN UNDER BREAST Jarold Medici, MD  Active   pantoprazole  (PROTONIX ) 20 MG tablet [id number]  TAKE 1 TABLET BY MOUTH DAILY AS NEEDED FOR HEARTBURN  Patient taking differently: TAKE 1 TABLET BY MOUTH DAILY AS NEEDED FOR HEARTBURN. EXCEPT ON THE WEEKENDS   Jarold Medici, MD  Active   Semaglutide ,0.25 or 0.5MG /DOS, (OZEMPIC , 0.25 OR 0.5 MG/DOSE,) 2 MG/3ML SOPN [id number]  INJECT 0.25MG  SUBCUTANEOUSLY EVERY TUESDAY. Jarold Medici, MD  Active   tiZANidine  (ZANAFLEX ) 4 MG tablet [id number]  TAKE 1 TABLET (4 MG TOTAL) BY MOUTH DAILY AS NEEDED FOR BACK PAIN Jarold Medici, MD  Active Self  vitamin C (ASCORBIC ACID) 500 MG tablet [id number]  Take 500 mg by mouth daily. [provider]  Active Self            Recommendation:   Continue Current Plan of Care  Follow Up Plan:   Closing From:  Complex Care Management  Clayborne Ly RN BSN CCM [facility]  [facility], [facility] Health Nurse Care Coordinator  Direct Dial: [phone] Website: [email or web address]

## 2024-07-08 ENCOUNTER — Other Ambulatory Visit: Payer: Self-pay | Admitting: Internal Medicine

## 2024-07-08 DIAGNOSIS — I10 Essential (primary) hypertension: Secondary | ICD-10-CM

## 2025-04-13 ENCOUNTER — Encounter: Admitting: Internal Medicine

## 2025-06-01 ENCOUNTER — Ambulatory Visit: Payer: Self-pay
